# Patient Record
Sex: Female | Born: 1975 | Race: Black or African American | Hispanic: No | Marital: Single | Smoking: Never smoker
Health system: Southern US, Community
[De-identification: ages and names within clinical notes are randomized; demographics above are authoritative.]

## PROBLEM LIST (undated history)

## (undated) DIAGNOSIS — E278 Other specified disorders of adrenal gland: Secondary | ICD-10-CM

## (undated) DIAGNOSIS — R600 Localized edema: Secondary | ICD-10-CM

## (undated) DIAGNOSIS — D171 Benign lipomatous neoplasm of skin and subcutaneous tissue of trunk: Secondary | ICD-10-CM

## (undated) DIAGNOSIS — R002 Palpitations: Secondary | ICD-10-CM

## (undated) DIAGNOSIS — E669 Obesity, unspecified: Secondary | ICD-10-CM

## (undated) DIAGNOSIS — G43909 Migraine, unspecified, not intractable, without status migrainosus: Secondary | ICD-10-CM

## (undated) DIAGNOSIS — D649 Anemia, unspecified: Secondary | ICD-10-CM

## (undated) DIAGNOSIS — I10 Essential (primary) hypertension: Secondary | ICD-10-CM

## (undated) DIAGNOSIS — R058 Other specified cough: Secondary | ICD-10-CM

## (undated) DIAGNOSIS — I160 Hypertensive urgency: Secondary | ICD-10-CM

## (undated) DIAGNOSIS — T464X5A Adverse effect of angiotensin-converting-enzyme inhibitors, initial encounter: Secondary | ICD-10-CM

## (undated) DIAGNOSIS — R42 Dizziness and giddiness: Secondary | ICD-10-CM

## (undated) DIAGNOSIS — R7989 Other specified abnormal findings of blood chemistry: Secondary | ICD-10-CM

## (undated) DIAGNOSIS — R05 Cough: Secondary | ICD-10-CM

## (undated) DIAGNOSIS — F900 Attention-deficit hyperactivity disorder, predominantly inattentive type: Secondary | ICD-10-CM

## (undated) HISTORY — DX: Essential (primary) hypertension: I10

## (undated) HISTORY — PX: NASAL SEPTUM SURGERY: SHX37

## (undated) HISTORY — DX: Dizziness and giddiness: R42

## (undated) HISTORY — DX: Adverse effect of angiotensin-converting-enzyme inhibitors, initial encounter: T46.4X5A

## (undated) HISTORY — DX: Obesity, unspecified: E66.9

## (undated) HISTORY — DX: Attention-deficit hyperactivity disorder, predominantly inattentive type: F90.0

## (undated) HISTORY — PX: WISDOM TOOTH EXTRACTION: SHX21

## (undated) HISTORY — DX: Other specified cough: R05.8

## (undated) HISTORY — DX: Palpitations: R00.2

## (undated) HISTORY — DX: Cough: R05

## (undated) HISTORY — DX: Migraine, unspecified, not intractable, without status migrainosus: G43.909

## (undated) HISTORY — PX: TONSILLECTOMY: SUR1361

## (undated) HISTORY — DX: Localized edema: R60.0

---

## 2004-12-11 ENCOUNTER — Observation Stay: Payer: Self-pay | Admitting: Unknown Physician Specialty

## 2004-12-18 ENCOUNTER — Inpatient Hospital Stay: Payer: Self-pay

## 2004-12-18 HISTORY — PX: TUBAL LIGATION: SHX77

## 2005-04-25 ENCOUNTER — Ambulatory Visit: Payer: Self-pay

## 2009-02-04 ENCOUNTER — Encounter: Admission: RE | Admit: 2009-02-04 | Discharge: 2009-02-04 | Payer: Self-pay | Admitting: Allergy

## 2009-08-31 ENCOUNTER — Ambulatory Visit: Payer: Self-pay | Admitting: Internal Medicine

## 2010-09-22 ENCOUNTER — Ambulatory Visit: Payer: Self-pay

## 2010-09-26 ENCOUNTER — Ambulatory Visit: Payer: Self-pay | Admitting: Anesthesiology

## 2010-09-27 ENCOUNTER — Ambulatory Visit: Payer: Self-pay

## 2010-09-27 HISTORY — PX: LAPAROSCOPIC SUPRACERVICAL HYSTERECTOMY: SUR797

## 2011-07-05 ENCOUNTER — Ambulatory Visit (INDEPENDENT_AMBULATORY_CARE_PROVIDER_SITE_OTHER): Payer: BC Managed Care – PPO | Admitting: Cardiology

## 2011-07-05 ENCOUNTER — Encounter: Payer: Self-pay | Admitting: Cardiology

## 2011-07-05 VITALS — BP 138/110 | HR 81 | Ht 66.0 in | Wt 184.4 lb

## 2011-07-05 DIAGNOSIS — R5381 Other malaise: Secondary | ICD-10-CM

## 2011-07-05 DIAGNOSIS — R079 Chest pain, unspecified: Secondary | ICD-10-CM | POA: Insufficient documentation

## 2011-07-05 DIAGNOSIS — R5383 Other fatigue: Secondary | ICD-10-CM

## 2011-07-05 DIAGNOSIS — I10 Essential (primary) hypertension: Secondary | ICD-10-CM | POA: Insufficient documentation

## 2011-07-05 LAB — CBC WITH DIFFERENTIAL/PLATELET
Basophils Absolute: 0 10*3/uL (ref 0.0–0.1)
Lymphocytes Relative: 28.1 % (ref 12.0–46.0)
Lymphs Abs: 2 10*3/uL (ref 0.7–4.0)
Monocytes Relative: 5.2 % (ref 3.0–12.0)
Platelets: 272 10*3/uL (ref 150.0–400.0)
RDW: 15 % — ABNORMAL HIGH (ref 11.5–14.6)

## 2011-07-05 LAB — BASIC METABOLIC PANEL
CO2: 25 mEq/L (ref 19–32)
Calcium: 9 mg/dL (ref 8.4–10.5)
Chloride: 109 mEq/L (ref 96–112)
Sodium: 145 mEq/L (ref 135–145)

## 2011-07-05 LAB — LIPID PANEL
HDL: 62.5 mg/dL (ref >39.00)
LDL Cholesterol: 103 mg/dL — ABNORMAL HIGH (ref 0–99)
Total CHOL/HDL Ratio: 3
Triglycerides: 58 mg/dL (ref 0.0–149.0)

## 2011-07-05 LAB — HEPATIC FUNCTION PANEL
Alkaline Phosphatase: 55 U/L (ref 39–117)
Bilirubin, Direct: 0.1 mg/dL (ref 0.0–0.3)
Total Bilirubin: 0.5 mg/dL (ref 0.3–1.2)
Total Protein: 8 g/dL (ref 6.0–8.3)

## 2011-07-05 MED ORDER — LISINOPRIL-HYDROCHLOROTHIAZIDE 20-12.5 MG PO TABS: 1.0000 | ORAL_TABLET | Freq: Every day | ORAL | Status: DC

## 2011-07-05 NOTE — Progress Notes (Signed)
   Amanda Fields Date of Birth: 01-12-1976   History of Present Illness: Amanda Fields is self-referred for evaluation of cardiovascular risk assessment. She is concerned about her family history since an uncle has congestive heart failure and a cousin had a myocardial infarction at age 37. She really denies any significant chest pain. Occasionally she feels a slight cramp sensation in her chest. This has been off and on for over one year. She is always attributed this to gas. It has not changed in frequency or severity. She has had hypertension since 2006 when she was diagnosed with this during pregnancy. The only medication she has been taking is torsemide. She can tell when her blood pressure is up because her ankles swell. She has had no recent evaluation of her blood pressure or laboratory evaluation. She denies any shortness of breath.  No current outpatient prescriptions on file prior to visit.    No Known Allergies  Past Medical History  Diagnosis Date  . Hypertension   . Edema leg   . Dizziness   . Migraine headache   . Asthma   . Chest pain   . Palpitation   . Endometriosis     Past Surgical History  Procedure Date  . Tonsillectomy   . Tubal ligation     History  Smoking status  . Never Smoker   Smokeless tobacco  . Not on file    History  Alcohol Use No    Family History  Problem Relation Age of Onset  . COPD Mother   . Hypotension Mother   . Colon cancer Father   . Emphysema Father   . Hyperlipidemia Sister   . Heart failure Paternal Uncle   . Heart attack Cousin 32    Review of Systems: The review of systems is positive for migraine headaches. She has mild lightheadedness at times. All other systems were reviewed and are negative.  Physical Exam: BP 138/110  Pulse 81  Ht 5\' 6"  (1.676 m)  Wt 184 lb 6.4 oz (83.643 kg)  BMI 29.76 kg/m2 She is a pleasant black female in no acute distress. She is normocephalic, atraumatic. Pupils are equal round and  reactive to light and accommodation. Extraocular movements are full. Sclerae clear. Oropharynx is clear. Neck is supple without JVD, adenopathy, thyromegaly, or bruits. Lungs are clear. Cardiac exam reveals a regular rate and rhythm. Normal S1-S2. No gallop, murmur, or click. Abdomen is soft and nontender without masses or bruits. Femoral and pedal pulses are 2+ and symmetric. She has no edema. Skin is warm and dry. She has tattoos on her lower extremities. She is alert oriented x3. Cranial nerves II through XII are intact. LABORATORY DATA: ECG demonstrates normal sinus rhythm with a nonspecific T-wave abnormality.  Assessment / Plan:

## 2011-07-05 NOTE — Assessment & Plan Note (Signed)
Her chest pain symptoms are very atypical for cardiac pain. I have recommended no further evaluation at this time. We need to focus on risk factor modification.

## 2011-07-05 NOTE — Assessment & Plan Note (Signed)
Her blood pressure is poorly controlled. She is only taking torsemide for blood pressure. I think this is a poor choice for blood pressure agent. We will switch her to lisinopril HCT 20/12.5 mg daily. I've instructed her on sodium restriction. She is to continue with her exercise program. We will obtain baseline lab work today including CBC, chemistry panel, lipid panel, and TSH. I will followup again in one month to assess her blood pressure control. She will need to establish with a primary care physician long-term.

## 2011-07-05 NOTE — Patient Instructions (Addendum)
Avoid salt intake.  Stop torsemide.  We will start you on prinizide HCT 20-12.5 mg daily.  We will check blood work on you today and call you with results.  We will see you back again in one month.

## 2011-07-06 ENCOUNTER — Encounter: Payer: Self-pay | Admitting: Cardiology

## 2011-07-07 ENCOUNTER — Telehealth: Payer: Self-pay | Admitting: *Deleted

## 2011-07-07 DIAGNOSIS — I10 Essential (primary) hypertension: Secondary | ICD-10-CM

## 2011-07-07 MED ORDER — POTASSIUM CHLORIDE CRYS ER 20 MEQ PO TBCR: 20.0000 meq | EXTENDED_RELEASE_TABLET | Freq: Every day | ORAL | Status: DC

## 2011-07-07 NOTE — Progress Notes (Signed)
Lm

## 2011-07-07 NOTE — Telephone Encounter (Signed)
Message copied by Lorayne Bender on Fri Jul 07, 2011  8:20 AM ------      Message from: [place], PETER M      Created: Thu Jul 06, 2011 11:55 AM       CBC, TSH, chemistries look good except low K+. Rec potassium 20 meq daily. Recheck Bmet next visit. Should get better off Torsemide. Lipids OK.

## 2011-07-07 NOTE — Telephone Encounter (Signed)
Notified of lab results. Sent Rx for klor con to [facility]. Will get repeat Bmet next visit.

## 2011-08-02 ENCOUNTER — Other Ambulatory Visit: Payer: Self-pay | Admitting: Nurse Practitioner

## 2011-08-02 DIAGNOSIS — I10 Essential (primary) hypertension: Secondary | ICD-10-CM

## 2011-08-04 ENCOUNTER — Ambulatory Visit: Payer: BC Managed Care – PPO | Admitting: Nurse Practitioner

## 2011-08-04 ENCOUNTER — Other Ambulatory Visit: Payer: BC Managed Care – PPO | Admitting: *Deleted

## 2011-08-30 ENCOUNTER — Encounter: Payer: Self-pay | Admitting: Obstetrics & Gynecology

## 2011-08-30 ENCOUNTER — Other Ambulatory Visit: Payer: Self-pay | Admitting: Obstetrics & Gynecology

## 2011-08-30 ENCOUNTER — Ambulatory Visit (INDEPENDENT_AMBULATORY_CARE_PROVIDER_SITE_OTHER): Payer: BC Managed Care – PPO | Admitting: Obstetrics & Gynecology

## 2011-08-30 VITALS — BP 144/99 | HR 90 | Ht 67.0 in | Wt 177.0 lb

## 2011-08-30 DIAGNOSIS — N644 Mastodynia: Secondary | ICD-10-CM

## 2011-08-30 DIAGNOSIS — Z113 Encounter for screening for infections with a predominantly sexual mode of transmission: Secondary | ICD-10-CM

## 2011-08-30 DIAGNOSIS — Z01419 Encounter for gynecological examination (general) (routine) without abnormal findings: Secondary | ICD-10-CM

## 2011-08-30 DIAGNOSIS — N6310 Unspecified lump in the right breast, unspecified quadrant: Secondary | ICD-10-CM

## 2011-08-30 DIAGNOSIS — Z1272 Encounter for screening for malignant neoplasm of vagina: Secondary | ICD-10-CM

## 2011-08-30 LAB — HEPATITIS B SURFACE ANTIBODY,QUALITATIVE

## 2011-08-30 LAB — HIV ANTIBODY (ROUTINE TESTING W REFLEX): HIV: NONREACTIVE

## 2011-08-30 NOTE — Progress Notes (Signed)
  Subjective:     Amanda Fields is a 35 y.o. female here for a routine exam.  Patient complains of pain in her right breast area, says she feels an enlarged vessel.   No other symptoms.  Of note, patient reports that she underwent laparoscopic supracervical hysterectomy in 2011 for endometriosis; no BSO done as patient reports she did not want hormone replacement therapy.  No further symptoms of endometriosis since surgery.   Gynecologic History No LMP recorded. Patient has had a hysterectomy. Contraception: status post hysterectomy Last Pap was last year. Results were: normal  Obstetric History OB History    Grav Para Term Preterm Abortions TAB SAB Ect Mult Living   2 2 2       2      # Outc Date GA Lbr Len/2nd Wgt Sex Del Anes PTL Lv   1 TRM 11/96    F SVD   Yes   2 TRM 1/06    M SVD   Yes     The following portions of the patient's history were reviewed and updated as appropriate: allergies, current medications, past family history, past medical history, past social history, past surgical history and problem list.  Review of Systems A comprehensive review of systems was negative.    Objective:    GENERAL: Well-developed, well-nourished female in no acute distress.  HEENT: Normocephalic, atraumatic. Sclerae anicteric.  NECK: Supple. Normal thyroid.  LUNGS: Clear to auscultation bilaterally.  HEART: Regular rate and rhythm. BREASTS: Symmetric with everted nipples. Palpable, tender cord/vessle in the upper, outer quadrant of right breast.  No other masses, skin changes, nipple drainage, lymphadenopathy bilaterally. ABDOMEN: Soft, nontender, nondistended. No organomegaly. PELVIC: Normal external female genitalia. Vagina is pink and rugated.  Normal discharge. Multiparous cervix contour. Uterus not palpated. No adnexal mass or tenderness. Pap smear, wet prep obtained.  EXTREMITIES: No cyanosis, clubbing, or edema, 2+ distal pulses.     Assessment:    Healthy female exam.  Patient  desires STI screen   Plan:  Follow up pap and STI screen Diagnostic breast ultrasound ordered, will follow up results Obtain records from prior OB/GYN Routine gynecologic care

## 2011-08-30 NOTE — Patient Instructions (Signed)

## 2011-08-31 LAB — WET PREP, GENITAL: Trich, Wet Prep: NONE SEEN

## 2011-09-06 ENCOUNTER — Ambulatory Visit
Admission: RE | Admit: 2011-09-06 | Discharge: 2011-09-06 | Disposition: A | Discharge status: Home / Self Care | Payer: BC Managed Care – PPO | Source: Ambulatory Visit | Attending: Obstetrics & Gynecology | Admitting: Obstetrics & Gynecology

## 2011-09-06 DIAGNOSIS — N6310 Unspecified lump in the right breast, unspecified quadrant: Secondary | ICD-10-CM

## 2011-09-06 DIAGNOSIS — N644 Mastodynia: Secondary | ICD-10-CM

## 2011-09-06 DIAGNOSIS — D171 Benign lipomatous neoplasm of skin and subcutaneous tissue of trunk: Secondary | ICD-10-CM

## 2011-09-06 HISTORY — DX: Benign lipomatous neoplasm of skin and subcutaneous tissue of trunk: D17.1

## 2011-09-06 NOTE — Assessment & Plan Note (Signed)
Benign right anterior chest wall lipoma on 09/06/11 breast ultrasound and diagnostic mammogram; needs repeat screening mammogram in one year.

## 2012-01-04 ENCOUNTER — Ambulatory Visit (INDEPENDENT_AMBULATORY_CARE_PROVIDER_SITE_OTHER): Payer: BC Managed Care – PPO | Admitting: Obstetrics & Gynecology

## 2012-01-04 ENCOUNTER — Encounter: Payer: Self-pay | Admitting: Obstetrics & Gynecology

## 2012-01-04 VITALS — BP 134/108 | HR 66 | Ht 66.0 in | Wt 188.0 lb

## 2012-01-04 DIAGNOSIS — G8929 Other chronic pain: Secondary | ICD-10-CM

## 2012-01-04 DIAGNOSIS — Z23 Encounter for immunization: Secondary | ICD-10-CM

## 2012-01-04 DIAGNOSIS — N949 Unspecified condition associated with female genital organs and menstrual cycle: Secondary | ICD-10-CM

## 2012-01-04 DIAGNOSIS — R102 Pelvic and perineal pain: Secondary | ICD-10-CM

## 2012-01-04 NOTE — Progress Notes (Signed)
  Subjective:    Patient ID: Amanda Fields, female    DOB: [birth year], 36 y.o.   MRN: [id number]  HPI  36 yo S AA lady who had a laproscopic supracervical hysterectomy 10/11 at [facility]. She was told she has endometriosis. We are in the process of trying to get those records. She comes in with continued pelvic pain "like someone is grinding something into my right side".   Review of Systems     Objective:   Physical Exam   Normal appearing cervix, bimanual reveals no masses     Assessment & Plan:  CPP- probably continued endometriosis. I will get cervical cultures and a pelvic ultrasound. If these are normal, and her pathology report confirms endometriosis, then I would recommend a laproscopic/robotic BSO.

## 2012-01-05 LAB — GC/CHLAMYDIA PROBE AMP, URINE: Chlamydia, Swab/Urine, PCR: NEGATIVE

## 2012-01-31 ENCOUNTER — Ambulatory Visit: Payer: BC Managed Care – PPO | Admitting: Obstetrics & Gynecology

## 2012-01-31 DIAGNOSIS — N949 Unspecified condition associated with female genital organs and menstrual cycle: Secondary | ICD-10-CM

## 2012-03-05 ENCOUNTER — Encounter: Payer: Self-pay | Admitting: Nurse Practitioner

## 2012-03-05 ENCOUNTER — Ambulatory Visit (INDEPENDENT_AMBULATORY_CARE_PROVIDER_SITE_OTHER): Payer: BC Managed Care – PPO | Admitting: Nurse Practitioner

## 2012-03-05 VITALS — BP 148/106 | HR 78 | Ht 66.75 in | Wt 193.0 lb

## 2012-03-05 DIAGNOSIS — I10 Essential (primary) hypertension: Secondary | ICD-10-CM

## 2012-03-05 LAB — BASIC METABOLIC PANEL
BUN: 14 mg/dL (ref 6–23)
CO2: 29 mEq/L (ref 19–32)
Calcium: 9.4 mg/dL (ref 8.4–10.5)
Chloride: 105 mEq/L (ref 96–112)
Creatinine, Ser: 1 mg/dL (ref 0.4–1.2)
GFR: 76.23 mL/min (ref >60.00)
Glucose, Bld: 88 mg/dL (ref 70–99)
Potassium: 3.8 mEq/L (ref 3.5–5.1)
Sodium: 142 mEq/L (ref 135–145)

## 2012-03-05 NOTE — Patient Instructions (Signed)
Stop your Lisinopril Hct  Start Benicar HCT 40/25 mg each morning.  Stay on your other medicines.  We are going to check lab today.  I will see you in about 2 1/2 weeks.  Monitor your blood pressure at home.   Call the [facility] office at [phone] if you have any questions, problems or concerns.

## 2012-03-05 NOTE — Progress Notes (Signed)
   Amanda Fields Date of Birth: 03-22-1976 Medical Record #[id number]  History of Present Illness: Amanda Fields is seen today for a work in visit. She is seen for Dr. [place]. She is a 36 year old black female with a history of HTN. She is here because her blood pressure is not controlled and she is having more issues with swelling. She tries to watch her salt. She is exercising regularly. No chest pain. Not short of breath. Frustrated about trying to lose weight. She does note a chronic cough since she has been on the Lisinopril Hct. She actually takes this at night.   Current Outpatient Prescriptions on File Prior to Visit  Medication Sig Dispense Refill  . baclofen (LIORESAL) 10 MG tablet Take 10 mg by mouth as needed.       . multivitamin-iron-minerals-folic acid (CENTRUM) chewable tablet Chew 1 tablet by mouth daily.        . potassium chloride SA (KLOR-CON M20) 20 MEQ tablet Take 1 tablet (20 mEq total) by mouth daily.  30 tablet  5  . topiramate (TOPAMAX) 100 MG tablet Take 100 mg by mouth as needed.         No Known Allergies  Past Medical History  Diagnosis Date  . Hypertension   . Edema leg   . Dizziness   . Migraine headache   . Asthma   . Palpitation   . Endometriosis   . 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 Obesity     Past Surgical History  Procedure Date  . Tonsillectomy   . Tubal ligation   . Wisdom tooth extraction   . Laparoscopic supracervical hysterectomy 09/27/2010    For endometriosis. Ovaries anf tubes are in place.    History  Smoking status  . Never Smoker   Smokeless tobacco  . Never Used    History  Alcohol Use No    Family History  Problem Relation Age of Onset  . COPD Mother   . Hypotension Mother   . Colon cancer Father   . Emphysema Father   . Cancer Father     colon  . Hyperlipidemia Sister   . Heart failure Paternal Uncle   . Heart attack Cousin 32  . Heart disease Cousin     heart attack    Review of Systems: The review of systems  is positive for edema. It does go down overnight.  All other systems were reviewed and are negative.  Physical Exam: BP 148/106  Pulse 78  Ht 5' 6.75" (1.695 m)  Wt 193 lb (87.544 kg)  BMI 30.45 kg/m2 Patient is very pleasant and in no acute distress. She is obese. Skin is warm and dry. Color is normal.  HEENT is unremarkable. Normocephalic/atraumatic. PERRL. Sclera are nonicteric. Neck is supple. No masses. No JVD. Lungs are clear. Cardiac exam shows a regular rate and rhythm. Abdomen is soft. Extremities are with just a trace edema. Gait and ROM are intact. No gross neurologic deficits noted.   LABORATORY DATA: PENDING  Assessment / Plan:

## 2012-03-05 NOTE — Assessment & Plan Note (Signed)
Blood pressure remains elevated. She continues to note edema of her legs. She has a probable ACE cough. I have stopped her Lisinopril Hct and placed her on Benicar Hct 40/25 mg daily. Samples are given. If she tolerates, I will change her to the generic on her return visit. We are checking a follow up BMET today. I will see her back in about 2 1/2 weeks. She is to watch her salt and to continue to monitor her blood pressure at home. Patient is agreeable to this plan and will call if any problems develop in the interim.

## 2012-03-07 ENCOUNTER — Other Ambulatory Visit: Payer: Self-pay | Admitting: *Deleted

## 2012-03-07 MED ORDER — POTASSIUM CHLORIDE CRYS ER 20 MEQ PO TBCR: 20.0000 meq | EXTENDED_RELEASE_TABLET | Freq: Every day | ORAL | Status: DC

## 2012-03-22 ENCOUNTER — Ambulatory Visit (INDEPENDENT_AMBULATORY_CARE_PROVIDER_SITE_OTHER): Payer: BC Managed Care – PPO | Admitting: Nurse Practitioner

## 2012-03-22 ENCOUNTER — Encounter: Payer: Self-pay | Admitting: Nurse Practitioner

## 2012-03-22 VITALS — BP 122/98 | HR 76 | Ht 66.75 in | Wt 196.0 lb

## 2012-03-22 DIAGNOSIS — I10 Essential (primary) hypertension: Secondary | ICD-10-CM

## 2012-03-22 LAB — BASIC METABOLIC PANEL
BUN: 12 mg/dL (ref 6–23)
CO2: 30 mEq/L (ref 19–32)
Calcium: 9.1 mg/dL (ref 8.4–10.5)
Chloride: 104 mEq/L (ref 96–112)
Creatinine, Ser: 1.1 mg/dL (ref 0.4–1.2)
GFR: 72.96 mL/min (ref >60.00)
Glucose, Bld: 77 mg/dL (ref 70–99)
Potassium: 3.5 mEq/L (ref 3.5–5.1)
Sodium: 142 mEq/L (ref 135–145)

## 2012-03-22 MED ORDER — OLMESARTAN MEDOXOMIL-HCTZ 40-25 MG PO TABS: 1.0000 | ORAL_TABLET | Freq: Every day | ORAL | Status: DC

## 2012-03-22 NOTE — Assessment & Plan Note (Signed)
Blood pressure is better at home. She feels good on her current regimen. Her swelling is resolved. She is encouraged to minimize her salt intake. She will continue to monitor her blood pressure at home. I have sent a prescription to the drug store for the Benicar Hct but we may have to switch to Hyzaar due to insurance. She is ACE allergic. We will recheck a BMET today. We will tentatively see her back in 6 months. Patient is agreeable to this plan and will call if any problems develop in the interim.

## 2012-03-22 NOTE — Progress Notes (Signed)
Amanda Fields Date of Birth: 1976-11-14 Medical Record #[id number]  History of Present Illness: Amanda Fields is seen back today for a 2 week check. She is seen for. Dr. [place]. She has HTN and edema. She was seen 2 weeks ago and was complaining of swelling and a dry, hacky cough. She was on Lisinopril HCT, which she was taking at night. This was switched over  to Benicar HCT with an increase in the dose of diuretic.   She comes back today for follow up. She is here with her son. She is doing well. She really likes being on the Benicar HCT. Her swelling is resolved. Blood pressure at home has trended back down and was 120/85 this morning. She continues to exercise regularly. She is on potassium supplementation with her ARB/diuretic. Will need a recheck of her BMET today.   Current Outpatient Prescriptions on File Prior to Visit  Medication Sig Dispense Refill  . baclofen (LIORESAL) 10 MG tablet Take 10 mg by mouth as needed.       . multivitamin-iron-minerals-folic acid (CENTRUM) chewable tablet Chew 1 tablet by mouth daily.        . potassium chloride SA (KLOR-CON M20) 20 MEQ tablet Take 1 tablet (20 mEq total) by mouth daily.  30 tablet  5  . topiramate (TOPAMAX) 100 MG tablet Take 100 mg by mouth as needed.       Marland Kitchen DISCONTD: fluticasone (FLONASE) 50 MCG/ACT nasal spray Place 2 sprays into the nose as needed.        Marland Kitchen DISCONTD: lisinopril-hydrochlorothiazide (PRINZIDE) 20-12.5 MG per tablet Take 1 tablet by mouth daily.  30 tablet  6  . DISCONTD: torsemide (DEMADEX) 10 MG tablet Take 10 mg by mouth daily.          No Known Allergies  Past Medical History  Diagnosis Date  . Hypertension   . Edema leg   . Dizziness   . Migraine headache   . Asthma   . Palpitation   . Endometriosis   . 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 Obesity   . Cough due to ACE inhibitor     Past Surgical History  Procedure Date  . Tonsillectomy   . Tubal ligation   . Wisdom tooth extraction   . Laparoscopic  supracervical hysterectomy 09/27/2010    For endometriosis. Ovaries anf tubes are in place.    History  Smoking status  . Never Smoker   Smokeless tobacco  . Never Used    History  Alcohol Use No    Family History  Problem Relation Age of Onset  . COPD Mother   . Hypotension Mother   . Colon cancer Father   . Emphysema Father   . Cancer Father     colon  . Hyperlipidemia Sister   . Heart failure Paternal Uncle   . Heart attack Cousin 32  . Heart disease Cousin     heart attack    Review of Systems: The review of systems is per the HPI.   All other systems were reviewed and are negative.  Physical Exam: Ht 5' 6.75" (1.695 m)  Wt 196 lb (88.905 kg)  BMI 30.93 kg/m2 Patient is very pleasant and in no acute distress. Skin is warm and dry. Color is normal.  HEENT is unremarkable. Normocephalic/atraumatic. PERRL. Sclera are nonicteric. Neck is supple. No masses. No JVD. Lungs are clear. Cardiac exam shows a regular rate and rhythm. Abdomen is soft. Extremities are without edema. Gait and ROM are  intact. No gross neurologic deficits noted.  LABORATORY DATA: Repeat BMET is pending.    Lab Results  Component Value Date   WBC 6.9 07/05/2011   HGB 12.5 07/05/2011   HCT 37.6 07/05/2011   PLT 272.0 07/05/2011   GLUCOSE 88 03/05/2012   CHOL 177 07/05/2011   TRIG 58.0 07/05/2011   HDL 62.50 07/05/2011   LDLCALC 103* 07/05/2011   ALT 14 07/05/2011   AST 20 07/05/2011   NA 142 03/05/2012   K 3.8 03/05/2012   CL 105 03/05/2012   CREATININE 1.0 03/05/2012   BUN 14 03/05/2012   CO2 29 03/05/2012   TSH 0.83 07/05/2011     Assessment / Plan:

## 2012-03-22 NOTE — Patient Instructions (Signed)
Continue with your current medicines. Monitor your blood pressure at home.  Record your readings and bring to your next visit. Limit sodium intake. Call for any problems.  We will see you in 6 months.  Call the [facility] office at [phone] if you have any questions, problems or concerns.

## 2012-04-09 ENCOUNTER — Ambulatory Visit (INDEPENDENT_AMBULATORY_CARE_PROVIDER_SITE_OTHER): Payer: BC Managed Care – PPO | Admitting: Family Medicine

## 2012-04-09 ENCOUNTER — Encounter: Payer: Self-pay | Admitting: Family Medicine

## 2012-04-09 VITALS — BP 134/100 | HR 84 | Temp 98.2°F | Wt 197.0 lb

## 2012-04-09 DIAGNOSIS — I10 Essential (primary) hypertension: Secondary | ICD-10-CM

## 2012-04-09 DIAGNOSIS — R600 Localized edema: Secondary | ICD-10-CM | POA: Insufficient documentation

## 2012-04-09 DIAGNOSIS — R609 Edema, unspecified: Secondary | ICD-10-CM

## 2012-04-09 LAB — T4, FREE: Free T4: 0.72 ng/dL (ref 0.60–1.60)

## 2012-04-09 LAB — COMPREHENSIVE METABOLIC PANEL
ALT: 16 U/L (ref 0–35)
AST: 23 U/L (ref 0–37)
BUN: 20 mg/dL (ref 6–23)
Creatinine, Ser: 1.1 mg/dL (ref 0.4–1.2)
Total Bilirubin: 0.2 mg/dL — ABNORMAL LOW (ref 0.3–1.2)

## 2012-04-09 MED ORDER — POTASSIUM CHLORIDE CRYS ER 20 MEQ PO TBCR: 20.0000 meq | EXTENDED_RELEASE_TABLET | Freq: Every day | ORAL | Status: DC

## 2012-04-09 MED ORDER — OLMESARTAN MEDOXOMIL-HCTZ 40-25 MG PO TABS: 1.0000 | ORAL_TABLET | Freq: Every day | ORAL | Status: DC

## 2012-04-09 NOTE — Patient Instructions (Signed)
I am going to either speak with or get an appointment with your cardiologist. We are likely going to change your medication. I also ordered an ultrasound of your heart. We are rechecking some labs today so we will call with those results as well.

## 2012-04-09 NOTE — Progress Notes (Signed)
Very pleasant 36 yo female here to establish care emergently.  We received a call from the [facility] clinic stating she needed to be seen for LE edema or her "veins would collapse."  Had been seeing Dr. [place] and Lawson Fiscal for HTN and edema.  She was seen in March for complaining of swelling and a dry, hacky cough. She was on Lisinopril HCT, which she was taking at night. This was switched over  to Benicar HCT with an increase in the dose of diuretic and symptoms initially improved until this weekend.  Bilateral lower extremity edema deteriorated and she went to UC in [place] (awaiting those records).  Of note, she was on demadex for years for edema and HTN which was d/c'd by cardiology as it was not controlling her BP.  She has never had any SOB or CP. Pt denies having a 2 d echo.  She exercises daily.  Feels she has no increased salt intake and that swelling has been deteriorating for past couple of weeks.  Went to "vein doctor" in [place].  Per pt, dopplers neg.  Current Outpatient Prescriptions on File Prior to Visit  Medication Sig Dispense Refill  . baclofen (LIORESAL) 10 MG tablet Take 10 mg by mouth as needed.       . multivitamin-iron-minerals-folic acid (CENTRUM) chewable tablet Chew 1 tablet by mouth daily.        Marland Kitchen topiramate (TOPAMAX) 100 MG tablet Take 100 mg by mouth as needed.       Marland Kitchen DISCONTD: potassium chloride SA (KLOR-CON M20) 20 MEQ tablet Take 1 tablet (20 mEq total) by mouth daily.  30 tablet  5  . DISCONTD: fluticasone (FLONASE) 50 MCG/ACT nasal spray Place 2 sprays into the nose as needed.        Marland Kitchen DISCONTD: lisinopril-hydrochlorothiazide (PRINZIDE) 20-12.5 MG per tablet Take 1 tablet by mouth daily.  30 tablet  6  . DISCONTD: torsemide (DEMADEX) 10 MG tablet Take 10 mg by mouth daily.          Allergies  Allergen Reactions  . Ace Inhibitors Cough    Past Medical History  Diagnosis Date  . Hypertension   . Edema leg   . Dizziness   . Migraine headache     . Asthma   . Palpitation   . Endometriosis   . 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 Obesity   . Cough due to ACE inhibitor     Past Surgical History  Procedure Date  . Tonsillectomy   . Tubal ligation   . Wisdom tooth extraction   . Laparoscopic supracervical hysterectomy 09/27/2010    For endometriosis. Ovaries anf tubes are in place.    History  Smoking status  . Never Smoker   Smokeless tobacco  . Never Used    History  Alcohol Use No    Family History  Problem Relation Age of Onset  . COPD Mother   . Hypotension Mother   . Colon cancer Father   . Emphysema Father   . Cancer Father     colon  . Hyperlipidemia Sister   . Heart failure Paternal Uncle   . Heart attack Cousin 32  . Heart disease Cousin     heart attack    Review of Systems: The review of systems is per the HPI.   All other systems were reviewed and are negative.  Physical Exam: BP 134/100  Pulse 84  Temp(Src) 98.2 F (36.8 C) (Oral)  Wt 197 lb (89.359 kg) BP  Readings from Last 3 Encounters:  04/09/12 134/100  03/22/12 122/98  03/05/12 148/106    General:  Well-developed,well-nourished,in no acute distress; alert,appropriate and cooperative throughout examination Head:  normocephalic and atraumatic.   Eyes:  vision grossly intact, pupils equal, pupils round, and pupils reactive to light.   Ears:  R ear normal and L ear normal.   Nose:  no external deformity.   Mouth:  good dentition.   Lungs:  Normal respiratory effort, chest expands symmetrically. Lungs are clear to auscultation, no crackles or wheezes. Heart:  Normal rate and regular rhythm. S1 and S2 normal without gallop, murmur, click, rub or other extra sounds. Abdomen:  Bowel sounds positive,abdomen soft and non-tender without masses, organomegaly or hernias noted. Extremities:  2+pitting edema bilaterally   Neurologic:  alert & oriented X3 and gait normal.   Skin:  Intact without suspicious lesions or rashes Psych:  Cognition and  judgment appear intact. Alert and cooperative with normal attention span and concentration. No apparent delusions, illusions, hallucinations   1. Bilateral leg edema  Comprehensive metabolic panel, TSH, T4, Free, 2D Echocardiogram without contrast      Lab Results  Component Value Date   WBC 6.9 07/05/2011   HGB 12.5 07/05/2011   HCT 37.6 07/05/2011   PLT 272.0 07/05/2011   GLUCOSE 77 03/22/2012   CHOL 177 07/05/2011   TRIG 58.0 07/05/2011   HDL 62.50 07/05/2011   LDLCALC 103* 07/05/2011   ALT 14 07/05/2011   AST 20 07/05/2011   NA 142 03/22/2012   K 3.5 03/22/2012   CL 104 03/22/2012   CREATININE 1.1 03/22/2012   BUN 12 03/22/2012   CO2 30 03/22/2012   TSH 0.83 07/05/2011     Assessment / Plan: 1. Bilateral leg edema   Deteriorated. Start compression hose. Recheck labs today and order 2 decho for further evaluation. Spoke with Rickey Primus, NP who has been managing her HTN and edema.   Per her suggestion, will continue her current dose of Benicar and follow up with cardiology after she has her echo. Comprehensive metabolic panel, TSH, T4, Free, 2D Echocardiogram without contrast  2. Hypertension  See above.

## 2012-04-10 ENCOUNTER — Other Ambulatory Visit: Payer: Self-pay

## 2012-04-10 ENCOUNTER — Other Ambulatory Visit (INDEPENDENT_AMBULATORY_CARE_PROVIDER_SITE_OTHER): Payer: BC Managed Care – PPO

## 2012-04-10 DIAGNOSIS — R600 Localized edema: Secondary | ICD-10-CM

## 2012-04-10 DIAGNOSIS — R002 Palpitations: Secondary | ICD-10-CM

## 2012-04-10 DIAGNOSIS — R609 Edema, unspecified: Secondary | ICD-10-CM

## 2012-04-17 ENCOUNTER — Ambulatory Visit (INDEPENDENT_AMBULATORY_CARE_PROVIDER_SITE_OTHER): Payer: BC Managed Care – PPO | Admitting: Nurse Practitioner

## 2012-04-17 ENCOUNTER — Encounter: Payer: Self-pay | Admitting: Nurse Practitioner

## 2012-04-17 VITALS — BP 118/82 | HR 78 | Ht 66.0 in | Wt 198.0 lb

## 2012-04-17 DIAGNOSIS — R609 Edema, unspecified: Secondary | ICD-10-CM

## 2012-04-17 DIAGNOSIS — R600 Localized edema: Secondary | ICD-10-CM

## 2012-04-17 DIAGNOSIS — I10 Essential (primary) hypertension: Secondary | ICD-10-CM

## 2012-04-17 NOTE — Assessment & Plan Note (Signed)
Her blood pressure is great. Her echo looks good except for the moderate LVH. She has normal systolic and diastolic function. Perfect blood pressure control really needs to be her goal for long term. I have left her on her current regimen. Risk factor modification is strongly encouraged. We will see her back at her regular time. Patient is agreeable to this plan and will call if any problems develop in the interim.

## 2012-04-17 NOTE — Patient Instructions (Signed)
Stay on your regular medicines  Exercise daily  Minimize salt to less than 1500mg  daily  Use the support stockings for your swelling  We will see you back in October as scheduled.  Call the [facility] office at [phone] if you have any questions, problems or concerns.

## 2012-04-17 NOTE — Progress Notes (Signed)
Harle Stanford Date of Birth: 09/15/1976 Medical Record #[id number]  History of Present Illness: Ms. Harbor is seen back today for a follow up visit. She is seen for Dr. [place]. She has HTN and has had more recent issues with edema of her feet and legs. She was referred for an echocardiogram to further evaluate the swelling.  She comes in today. She is here alone. She is feeling good. Blood pressure has trended down nicely. We have discussed her echo results in depth. She has normal LV function, no evidence of diastolic dysfunction but with moderate LVH. Good blood pressure control is going to be critical. She has started using support stockings and has had significant improvement in her swelling. Her salt use still seems questionable to me. Has been to [facility] recently. She says food is starting to taste different. She says she is exercising 6 days a week but continues to struggle with her weight. Denies using NSAIDs. She is not having shortness of breath. No chest pain. Her past cough resolved with switching to ARB therapy.   Current Outpatient Prescriptions on File Prior to Visit  Medication Sig Dispense Refill  . baclofen (LIORESAL) 10 MG tablet Take 10 mg by mouth as needed.       . multivitamin-iron-minerals-folic acid (CENTRUM) chewable tablet Chew 1 tablet by mouth daily.        Marland Kitchen olmesartan-hydrochlorothiazide (BENICAR HCT) 40-25 MG per tablet Take 1 tablet by mouth daily.  90 tablet  3  . potassium chloride SA (KLOR-CON M20) 20 MEQ tablet Take 1 tablet (20 mEq total) by mouth daily.  30 tablet  5  . topiramate (TOPAMAX) 100 MG tablet Take 100 mg by mouth as needed.       Marland Kitchen DISCONTD: fluticasone (FLONASE) 50 MCG/ACT nasal spray Place 2 sprays into the nose as needed.        Marland Kitchen DISCONTD: lisinopril-hydrochlorothiazide (PRINZIDE) 20-12.5 MG per tablet Take 1 tablet by mouth daily.  30 tablet  6  . DISCONTD: torsemide (DEMADEX) 10 MG tablet Take 10 mg by mouth daily.          Allergies    Allergen Reactions  . Ace Inhibitors Cough    Past Medical History  Diagnosis Date  . Hypertension     echo 03/2012 with moderate LVH, but normal EF and no evidence of diastolic dysfunction  . Edema leg   . Dizziness   . Migraine headache   . Asthma   . Palpitation   . Endometriosis   . 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 Obesity   . Cough due to ACE inhibitor     Past Surgical History  Procedure Date  . Tonsillectomy   . Tubal ligation   . Wisdom tooth extraction   . Laparoscopic supracervical hysterectomy 09/27/2010    For endometriosis. Ovaries anf tubes are in place.    History  Smoking status  . Never Smoker   Smokeless tobacco  . Never Used    History  Alcohol Use No    Family History  Problem Relation Age of Onset  . COPD Mother   . Hypotension Mother   . Colon cancer Father   . Emphysema Father   . Cancer Father     colon  . Hyperlipidemia Sister   . Heart failure Paternal Uncle   . Heart attack Cousin 32  . Heart disease Cousin     heart attack    Review of Systems: The review of systems is per the HPI.  All other systems were reviewed and are negative.  Physical Exam: BP 118/82  Pulse 78  Ht 5\' 6"  (1.676 m)  Wt 198 lb (89.812 kg)  BMI 31.96 kg/m2 Patient is very pleasant and in no acute distress. Skin is warm and dry. Color is normal.  HEENT is unremarkable. Normocephalic/atraumatic. PERRL. Sclera are nonicteric. Neck is supple. No masses. No JVD. Lungs are clear. Cardiac exam shows a regular rate and rhythm. Abdomen is soft. Extremities are without edema. Gait and ROM are intact. No gross neurologic deficits noted.   LABORATORY DATA:  Echo Study Conclusions April 2013  - Left ventricle: The cavity size was normal. Wall thickness was increased in a pattern of moderate LVH. There was mild concentric hypertrophy. Systolic function was normal. The estimated ejection fraction was in the range of 60% to 65%. Wall motion was normal; there were no  regional wall motion abnormalities. Left ventricular diastolic function parameters were normal. - Left atrium: The atrium was normal in size. - Right ventricle: Systolic function was normal. - Pulmonary arteries: Systolic pressure was within the normal range.    Assessment / Plan:

## 2012-04-17 NOTE — Assessment & Plan Note (Signed)
Her swelling has improved with support stockings and salt restriction. I did not give her any additional medicines and I don't think she needs additional medicines at this time.  I know she is frustrated about her weight and we discussed this in great detail. She may need to engage the help of a Systems analyst. Perhaps some of this is cyclical? She has had a partial hysterectomy but still has her ovaries in place. Salt restriction and the support stockings are key. She is to avoid NSAIDs. We will see her back at her regular time. Patient is agreeable to this plan and will call if any problems develop in the interim.

## 2012-06-07 ENCOUNTER — Encounter: Payer: Self-pay | Admitting: Obstetrics & Gynecology

## 2012-07-10 ENCOUNTER — Encounter: Payer: Self-pay | Admitting: Obstetrics & Gynecology

## 2012-07-10 ENCOUNTER — Other Ambulatory Visit: Payer: Self-pay | Admitting: Obstetrics & Gynecology

## 2012-07-10 ENCOUNTER — Ambulatory Visit (INDEPENDENT_AMBULATORY_CARE_PROVIDER_SITE_OTHER): Payer: BC Managed Care – PPO | Admitting: Obstetrics & Gynecology

## 2012-07-10 VITALS — BP 125/86 | HR 78 | Ht 66.0 in | Wt 200.0 lb

## 2012-07-10 DIAGNOSIS — N949 Unspecified condition associated with female genital organs and menstrual cycle: Secondary | ICD-10-CM

## 2012-07-10 DIAGNOSIS — R102 Pelvic and perineal pain: Secondary | ICD-10-CM | POA: Insufficient documentation

## 2012-07-10 NOTE — Progress Notes (Signed)
Follow up pelvic pain.  Wants to discuss menopause or perimenopause, mother went through menopause early. [id number] No LMP recorded. Patient has had a hysterectomy. She had [facility] 2011 and was told she had endometriosis. RLQ pain and dyspareunia persists. [place] that was ordered by Dr. Marice Potter in January was not done. I will reorder the pelvic ultrasound and records request from her care in [place]. We will consider Lupron Depot therapy or laparoscopic surgery pending review of [place] and records. Return to clinic in 2 weeks.  Kamariya Blevens 07/10/2012 9:17 AM

## 2012-07-10 NOTE — Patient Instructions (Signed)
Pelvic Pain in Women, Generic  Pelvic pain may be constant or come and go. It may be mild or severe. It is important to tell your caregiver exactly where the pain is located, when and how it occurs, and if it is related to your menstrual periods or stress. We have not found a definite cause for your pelvic pain today and you may need follow-up testing and examination.  CAUSES    Sexually transmitted diseases (STDS) cause pelvic inflammatory disease (PID). This is one of the most common causes of pelvic pain. It is an infection of the female sexual organs.   Endometriosis - This is a condition where some of the inside lining of the uterus is growing in the pelvis and abdomen outside the uterus. Along with (chronic) pain, this can cause infertility.   Tubal pregnancy - This is a serious condition where the pregnancy has occurred in a fallopian tube. Rupture of the tube can bleed heavily and cause death if it is not found in time.   Interstitial cystitis is an inflammation of the bladder that causes pelvic pain. People with severe cases of IC may urinate as many as 60 times a day.   Fibroids: A small percentage of women have uterine fibroids (non-cancerous smooth muscle growths in the uterus). Fibroids do not always cause pain.   Fibromyalgia is a disorder with symptoms of widespread muscle pain, fatigue and multiple tender points on the body.   Dysmenorrhea is painful menstrual periods.   Mittlesmertz is pain with ovulation.   Pelvic congestive syndrome, is engorgement of the pelvic veins just before and during a menstrual period.   Cervical stenosis is when the opening of the cervix is too small and causes pain during menstruation.   Adenomyosis (a type of endometriosis) glands that line the inside of the uterus lying in the muscle layer of the uterus.   Intestinal problems such as irritable bowel syndrome colitis or ileitis.   Appendicitis.   Pelvic cancer. Usually the cancer has been there for awhile  before causing pain.   Bladder infection.   Cysts or ovarian tumors.   Kidney stone.   Psychological factors (stress, sexual abuse or depression).   IUD (intrauterine device).   Prolapse (falling down of the uterus).   Retroflexed uterus - the uterus is tipped too far backwards.   Muscle spasms of the pelvic muscles.   Muscular-skeletal problems of the back (herniated disc).  DIAGNOSIS    Your caregiver may order testing, such as:   Blood tests.   Cultures to test for infection.   Ultrasound.   Looking into the bladder with a metal tube with a light (cystoscopy).   Looking into the pelvis and abdomen with very small incisions through a metal tube with a light (laparoscopy).   Looking into the large intestine with a fibro-optic tube with a light (colonoscopy).   CT scan - a type of X-ray to view the internal organs of the pelvis and abdomen.   MRI - views the pelvic and abdominal organs with a magnetic machine.   Intravenous pyelogram - views the kidneys, ureter and bladder after injecting dye through the vein by X-rays.   Injecting barium into the large intestine to view the intestine with X-rays (barium enema).   Not all test results are available during your visit. If your test results are not back during the visit, make an appointment with your caregiver or the medical facility. It is important for you to follow up   on all of your test results.  TREATMENT   Treatment will depend on the cause of the pain, such as:   Medication, antibiotics, pain medication, muscle relaxants, anti-depression drugs, hormones or birth control pills.   Physical therapy.   Acupuncture.   Psychiatric counseling.   Nerve blocks.   Surgery.  HOME CARE INSTRUCTIONS    Finish all medication as prescribed. Incomplete treatment will put you at risk for sterility and tubal pregnancy if your caregiver feels your pain is caused by an infection.   Rest and eat a balanced diet with plenty of fluids.   If you do have an  infection, your recent sexual partners may need treatment even if they are symptom-free or have a negative culture or evaluation. You also need follow-up to make sure you are no longer infected.   Only take over-the-counter or prescription medicines for pain, discomfort or fever as directed by your caregiver.   Apply warm or cold compresses to the lower abdomen depending on which one helps the pain.   Avoid stressful situations that may cause the pain.   Group therapy is sometimes helpful.   Make sure to follow all instructions. Some of the conditions listed above can have very serious outcomes if you do not take the time to follow-up with your caregiver.  SEEK IMMEDIATE MEDICAL CARE IF:    There is heavier bleeding from the birth canal (vagina).   You develop increasing abdominal pain.   You feel lightheaded or pass out.   An unexplained oral temperature above 102 F (38.9 C) develops.   Any of the problems which brought you to us are getting worse.   You are being physically or sexually abused.   You have painful urination.   You are still having pain four hours after taking prescription pain medication.   You have uncontrolled diarrhea.   You have abnormal vaginal discharge.  Document Released: 10/31/2004 Document Revised: 11/23/2011 Document Reviewed: 12/01/2008  ExitCare Patient Information 2012 [employer].

## 2012-07-17 ENCOUNTER — Other Ambulatory Visit: Payer: Self-pay | Admitting: Chiropractic Medicine

## 2012-07-17 ENCOUNTER — Ambulatory Visit (HOSPITAL_COMMUNITY)
Admission: RE | Admit: 2012-07-17 | Discharge: 2012-07-17 | Disposition: A | Discharge status: Home / Self Care | Payer: BC Managed Care – PPO | Source: Ambulatory Visit | Attending: Obstetrics & Gynecology | Admitting: Obstetrics & Gynecology

## 2012-07-17 ENCOUNTER — Ambulatory Visit
Admission: RE | Admit: 2012-07-17 | Discharge: 2012-07-17 | Disposition: A | Discharge status: Home / Self Care | Payer: BC Managed Care – PPO | Source: Ambulatory Visit | Attending: Chiropractic Medicine | Admitting: Chiropractic Medicine

## 2012-07-17 DIAGNOSIS — Z90710 Acquired absence of both cervix and uterus: Secondary | ICD-10-CM | POA: Insufficient documentation

## 2012-07-17 DIAGNOSIS — M419 Scoliosis, unspecified: Secondary | ICD-10-CM

## 2012-07-17 DIAGNOSIS — R102 Pelvic and perineal pain: Secondary | ICD-10-CM

## 2012-07-17 DIAGNOSIS — M5412 Radiculopathy, cervical region: Secondary | ICD-10-CM

## 2012-07-17 DIAGNOSIS — N838 Other noninflammatory disorders of ovary, fallopian tube and broad ligament: Secondary | ICD-10-CM | POA: Insufficient documentation

## 2012-07-17 DIAGNOSIS — M542 Cervicalgia: Secondary | ICD-10-CM

## 2012-07-17 DIAGNOSIS — IMO0002 Reserved for concepts with insufficient information to code with codable children: Secondary | ICD-10-CM | POA: Insufficient documentation

## 2012-07-17 DIAGNOSIS — N949 Unspecified condition associated with female genital organs and menstrual cycle: Secondary | ICD-10-CM | POA: Insufficient documentation

## 2012-07-24 ENCOUNTER — Encounter: Payer: Self-pay | Admitting: Obstetrics & Gynecology

## 2012-07-24 ENCOUNTER — Ambulatory Visit (INDEPENDENT_AMBULATORY_CARE_PROVIDER_SITE_OTHER): Payer: BC Managed Care – PPO | Admitting: Obstetrics & Gynecology

## 2012-07-24 VITALS — BP 119/81 | HR 77 | Ht 66.0 in | Wt 198.0 lb

## 2012-07-24 DIAGNOSIS — N898 Other specified noninflammatory disorders of vagina: Secondary | ICD-10-CM

## 2012-07-24 NOTE — Patient Instructions (Signed)
Bacterial Vaginosis Bacterial vaginosis (BV) is a vaginal infection where the normal balance of bacteria in the vagina is disrupted. The normal balance is then replaced by an overgrowth of certain bacteria. There are several different kinds of bacteria that can cause BV. BV is the most common vaginal infection in women of childbearing age. CAUSES   The cause of BV is not fully understood. BV develops when there is an increase or imbalance of harmful bacteria.   Some activities or behaviors can upset the normal balance of bacteria in the vagina and put women at increased risk including:   Having a new sex partner or multiple sex partners.   Douching.   Using an intrauterine device (IUD) for contraception.   It is not clear what role sexual activity plays in the development of BV. However, women that have never had sexual intercourse are rarely infected with BV.  Women do not get BV from toilet seats, bedding, swimming pools or from touching objects around them.  SYMPTOMS   Grey vaginal discharge.   A fish-like odor with discharge, especially after sexual intercourse.   Itching or burning of the vagina and vulva.   Burning or pain with urination.   Some women have no signs or symptoms at all.  DIAGNOSIS  Your caregiver must examine the vagina for signs of BV. Your caregiver will perform lab tests and look at the sample of vaginal fluid through a microscope. They will look for bacteria and abnormal cells (clue cells), a pH test higher than 4.5, and a positive amine test all associated with BV.  RISKS AND COMPLICATIONS   Pelvic inflammatory disease (PID).   Infections following gynecology surgery.   Developing HIV.   Developing herpes virus.  TREATMENT  Sometimes BV will clear up without treatment. However, all women with symptoms of BV should be treated to avoid complications, especially if gynecology surgery is planned. Female partners generally do not need to be treated. However,  BV may spread between female sex partners so treatment is helpful in preventing a recurrence of BV.   BV may be treated with antibiotics. The antibiotics come in either pill or vaginal cream forms. Either can be used with nonpregnant or pregnant women, but the recommended dosages differ. These antibiotics are not harmful to the baby.   BV can recur after treatment. If this happens, a second round of antibiotics will often be prescribed.   Treatment is important for pregnant women. If not treated, BV can cause a premature delivery, especially for a pregnant woman who had a premature birth in the past. All pregnant women who have symptoms of BV should be checked and treated.   For chronic reoccurrence of BV, treatment with a type of prescribed gel vaginally twice a week is helpful.  HOME CARE INSTRUCTIONS   Finish all medication as directed by your caregiver.   Do not have sex until treatment is completed.   Tell your sexual partner that you have a vaginal infection. They should see their caregiver and be treated if they have problems, such as a mild rash or itching.   Practice safe sex. Use condoms. Only have 1 sex partner.  PREVENTION  Basic prevention steps can help reduce the risk of upsetting the natural balance of bacteria in the vagina and developing BV:  Do not have sexual intercourse (be abstinent).   Do not douche.   Use all of the medicine prescribed for treatment of BV, even if the signs and symptoms go away.     Tell your sex partner if you have BV. That way, they can be treated, if needed, to prevent reoccurrence.  SEEK MEDICAL CARE IF:   Your symptoms are not improving after 3 days of treatment.   You have increased discharge, pain, or fever.  MAKE SURE YOU:   Understand these instructions.   Will watch your condition.   Will get help right away if you are not doing well or get worse.  FOR MORE INFORMATION  Division of STD Prevention (DSTDP), Centers for Disease  Control and Prevention: www.cdc.gov/std American Social Health Association (ASHA): www.ashastd.org  Document Released: 12/04/2005 Document Revised: 11/23/2011 Document Reviewed: 05/27/2009 ExitCare Patient Information 2012 [employer]. 

## 2012-07-24 NOTE — Progress Notes (Signed)
Subjective:     Patient ID: Amanda Fields, female   DOB: June 17, 1976, 36 y.o.   MRN: [id number]  HPI Pt c/o vaginal odor and increased pain after sono. No change in discharge. Using Summer's [employer] product.  C/o cont'd pain in pelvis. Worse with exercise.  Not improved with meds.       Review of Systems Chronic pelvic pain     Objective:   Physical Exam Pt in NAD GU: EGBUS: no lesions Vagina: no blood in vault, thick white discharge Cervix: no lesion; no mucopurulent d/c Uterus: small, mobile Adnexa: no masses;nontender       Assessment:     Chronic pelvic pain- pt does not want to consider surgical eval or meds at this time Vaginal odor- not reproduced on exam    Plan:     F/u wet mount and KOH F/u to discuss pelvic pain if pt decides she wants to eval further.  Amanda Fields, M.D., [facility]

## 2012-07-24 NOTE — Addendum Note (Signed)
Addended by: Vinnie Langton C on: 07/24/2012 12:25 PM   Modules accepted: Orders

## 2012-07-25 LAB — WET PREP, GENITAL: Trich, Wet Prep: NONE SEEN

## 2012-08-27 ENCOUNTER — Encounter: Payer: Self-pay | Admitting: Obstetrics & Gynecology

## 2012-08-27 ENCOUNTER — Ambulatory Visit (INDEPENDENT_AMBULATORY_CARE_PROVIDER_SITE_OTHER): Payer: BC Managed Care – PPO | Admitting: Obstetrics & Gynecology

## 2012-08-27 VITALS — BP 121/86 | HR 83 | Ht 66.0 in | Wt 199.4 lb

## 2012-08-27 DIAGNOSIS — Z1151 Encounter for screening for human papillomavirus (HPV): Secondary | ICD-10-CM

## 2012-08-27 DIAGNOSIS — N951 Menopausal and female climacteric states: Secondary | ICD-10-CM

## 2012-08-27 DIAGNOSIS — Z124 Encounter for screening for malignant neoplasm of cervix: Secondary | ICD-10-CM

## 2012-08-27 DIAGNOSIS — Z01419 Encounter for gynecological examination (general) (routine) without abnormal findings: Secondary | ICD-10-CM

## 2012-08-27 NOTE — Patient Instructions (Addendum)
Preventive Care for Adults, Female A healthy lifestyle and preventive care can promote health and wellness. Preventive health guidelines for women include the following key practices.  A routine yearly physical is a good way to check with your caregiver about your health and preventive screening. It is a chance to share any concerns and updates on your health, and to receive a thorough exam.   Visit your dentist for a routine exam and preventive care every 6 months. Brush your teeth twice a day and floss once a day. Good oral hygiene prevents tooth decay and gum disease.   The frequency of eye exams is based on your age, health, family medical history, use of contact lenses, and other factors. Follow your caregiver's recommendations for frequency of eye exams.   Eat a healthy diet. Foods like vegetables, fruits, whole grains, low-fat dairy products, and lean protein foods contain the nutrients you need without too many calories. Decrease your intake of foods high in solid fats, added sugars, and salt. Eat the right amount of calories for you.Get information about a proper diet from your caregiver, if necessary.   Regular physical exercise is one of the most important things you can do for your health. Most adults should get at least 150 minutes of moderate-intensity exercise (any activity that increases your heart rate and causes you to sweat) each week. In addition, most adults need muscle-strengthening exercises on 2 or more days a week.   Maintain a healthy weight. The body mass index (BMI) is a screening tool to identify possible weight problems. It provides an estimate of body fat based on height and weight. Your caregiver can help determine your BMI, and can help you achieve or maintain a healthy weight.For adults 20 years and older:   A BMI below 18.5 is considered underweight.   A BMI of 18.5 to 24.9 is normal.   A BMI of 25 to 29.9 is considered overweight.   A BMI of 30 and above is  considered obese.   Maintain normal blood lipids and cholesterol levels by exercising and minimizing your intake of saturated fat. Eat a balanced diet with plenty of fruit and vegetables. Blood tests for lipids and cholesterol should begin at age 20 and be repeated every 5 years. If your lipid or cholesterol levels are high, you are over 50, or you are at high risk for heart disease, you may need your cholesterol levels checked more frequently.Ongoing high lipid and cholesterol levels should be treated with medicines if diet and exercise are not effective.   If you smoke, find out from your caregiver how to quit. If you do not use tobacco, do not start.   If you are pregnant, do not drink alcohol. If you are breastfeeding, be very cautious about drinking alcohol. If you are not pregnant and choose to drink alcohol, do not exceed 1 drink per day. One drink is considered to be 12 ounces (355 mL) of beer, 5 ounces (148 mL) of wine, or 1.5 ounces (44 mL) of liquor.   Avoid use of street drugs. Do not share needles with anyone. Ask for help if you need support or instructions about stopping the use of drugs.   High blood pressure causes heart disease and increases the risk of stroke. Your blood pressure should be checked at least every 1 to 2 years. Ongoing high blood pressure should be treated with medicines if weight loss and exercise are not effective.   If you are 55 to 38   years old, ask your caregiver if you should take aspirin to prevent strokes.   Diabetes screening involves taking a blood sample to check your fasting blood sugar level. This should be done once every 3 years, after age 45, if you are within normal weight and without risk factors for diabetes. Testing should be considered at a younger age or be carried out more frequently if you are overweight and have at least 1 risk factor for diabetes.   Breast cancer screening is essential preventive care for women. You should practice "breast  self-awareness." This means understanding the normal appearance and feel of your breasts and may include breast self-examination. Any changes detected, no matter how small, should be reported to a caregiver. Women in their 20s and 30s should have a clinical breast exam (CBE) by a caregiver as part of a regular health exam every 1 to 3 years. After age 40, women should have a CBE every year. Starting at age 40, women should consider having a mammography (breast X-ray test) every year. Women who have a family history of breast cancer should talk to their caregiver about genetic screening. Women at a high risk of breast cancer should talk to their caregivers about having magnetic resonance imaging (MRI) and a mammography every year.   The Pap test is a screening test for cervical cancer. A Pap test can show cell changes on the cervix that might become cervical cancer if left untreated. A Pap test is a procedure in which cells are obtained and examined from the lower end of the uterus (cervix).   Women should have a Pap test starting at age 21.   Between ages 21 and 29, Pap tests should be repeated every 2 years.   Beginning at age 30, you should have a Pap test every 3 years as long as the past 3 Pap tests have been normal.   Some women have medical problems that increase the chance of getting cervical cancer. Talk to your caregiver about these problems. It is especially important to talk to your caregiver if a new problem develops soon after your last Pap test. In these cases, your caregiver may recommend more frequent screening and Pap tests.   The above recommendations are the same for women who have or have not gotten the vaccine for human papillomavirus (HPV).   If you had a hysterectomy for a problem that was not cancer or a condition that could lead to cancer, then you no longer need Pap tests. Even if you no longer need a Pap test, a regular exam is a good idea to make sure no other problems are  starting.   If you are between ages 65 and 70, and you have had normal Pap tests going back 10 years, you no longer need Pap tests. Even if you no longer need a Pap test, a regular exam is a good idea to make sure no other problems are starting.   If you have had past treatment for cervical cancer or a condition that could lead to cancer, you need Pap tests and screening for cancer for at least 20 years after your treatment.   If Pap tests have been discontinued, risk factors (such as a new sexual partner) need to be reassessed to determine if screening should be resumed.   The HPV test is an additional test that may be used for cervical cancer screening. The HPV test looks for the virus that can cause the cell changes on the cervix.   The cells collected during the Pap test can be tested for HPV. The HPV test could be used to screen women aged 30 years and older, and should be used in women of any age who have unclear Pap test results. After the age of 30, women should have HPV testing at the same frequency as a Pap test.   Colorectal cancer can be detected and often prevented. Most routine colorectal cancer screening begins at the age of 50 and continues through age 75. However, your caregiver may recommend screening at an earlier age if you have risk factors for colon cancer. On a yearly basis, your caregiver may provide home test kits to check for hidden blood in the stool. Use of a small camera at the end of a tube, to directly examine the colon (sigmoidoscopy or colonoscopy), can detect the earliest forms of colorectal cancer. Talk to your caregiver about this at age 50, when routine screening begins. Direct examination of the colon should be repeated every 5 to 10 years through age 75, unless early forms of pre-cancerous polyps or small growths are found.   Hepatitis C blood testing is recommended for all people born from 1945 through 1965 and any individual with known risks for hepatitis C.    Practice safe sex. Use condoms and avoid high-risk sexual practices to reduce the spread of sexually transmitted infections (STIs). STIs include gonorrhea, chlamydia, syphilis, trichomonas, herpes, HPV, and human immunodeficiency virus (HIV). Herpes, HIV, and HPV are viral illnesses that have no cure. They can result in disability, cancer, and death. Sexually active women aged 25 and younger should be checked for chlamydia. Older women with new or multiple partners should also be tested for chlamydia. Testing for other STIs is recommended if you are sexually active and at increased risk.   Osteoporosis is a disease in which the bones lose minerals and strength with aging. This can result in serious bone fractures. The risk of osteoporosis can be identified using a bone density scan. Women ages 65 and over and women at risk for fractures or osteoporosis should discuss screening with their caregivers. Ask your caregiver whether you should take a calcium supplement or vitamin D to reduce the rate of osteoporosis.   Menopause can be associated with physical symptoms and risks. Hormone replacement therapy is available to decrease symptoms and risks. You should talk to your caregiver about whether hormone replacement therapy is right for you.   Use sunscreen with sun protection factor (SPF) of 30 or more. Apply sunscreen liberally and repeatedly throughout the day. You should seek shade when your shadow is shorter than you. Protect yourself by wearing long sleeves, pants, a wide-brimmed hat, and sunglasses year round, whenever you are outdoors.   Once a month, do a whole body skin exam, using a mirror to look at the skin on your back. Notify your caregiver of new moles, moles that have irregular borders, moles that are larger than a pencil eraser, or moles that have changed in shape or color.   Stay current with required immunizations.   Influenza. You need a dose every fall (or winter). The composition of  the flu vaccine changes each year, so being vaccinated once is not enough.   Pneumococcal polysaccharide. You need 1 to 2 doses if you smoke cigarettes or if you have certain chronic medical conditions. You need 1 dose at age 65 (or older) if you have never been vaccinated.   Tetanus, diphtheria, pertussis (Tdap, Td). Get 1 dose of   Tdap vaccine if you are younger than age 65, are over 65 and have contact with an infant, are a healthcare worker, are pregnant, or simply want to be protected from whooping cough. After that, you need a Td booster dose every 10 years. Consult your caregiver if you have not had at least 3 tetanus and diphtheria-containing shots sometime in your life or have a deep or dirty wound.   HPV. You need this vaccine if you are a woman age 26 or younger. The vaccine is given in 3 doses over 6 months.   Measles, mumps, rubella (MMR). You need at least 1 dose of MMR if you were born in 1957 or later. You may also need a second dose.   Meningococcal. If you are age 19 to 21 and a first-year college student living in a residence hall, or have one of several medical conditions, you need to get vaccinated against meningococcal disease. You may also need additional booster doses.   Zoster (shingles). If you are age 60 or older, you should get this vaccine.   Varicella (chickenpox). If you have never had chickenpox or you were vaccinated but received only 1 dose, talk to your caregiver to find out if you need this vaccine.   Hepatitis A. You need this vaccine if you have a specific risk factor for hepatitis A virus infection or you simply wish to be protected from this disease. The vaccine is usually given as 2 doses, 6 to 18 months apart.   Hepatitis B. You need this vaccine if you have a specific risk factor for hepatitis B virus infection or you simply wish to be protected from this disease. The vaccine is given in 3 doses, usually over 6 months.  Preventive Services /  Frequency Ages 19 to 39  Blood pressure check.** / Every 1 to 2 years.   Lipid and cholesterol check.** / Every 5 years beginning at age 20.   Clinical breast exam.** / Every 3 years for women in their 20s and 30s.   Pap test.** / Every 2 years from ages 21 through 29. Every 3 years starting at age 30 through age 65 or 70 with a history of 3 consecutive normal Pap tests.   HPV screening.** / Every 3 years from ages 30 through ages 65 to 70 with a history of 3 consecutive normal Pap tests.   Hepatitis C blood test.** / For any individual with known risks for hepatitis C.   Skin self-exam. / Monthly.   Influenza immunization.** / Every year.   Pneumococcal polysaccharide immunization.** / 1 to 2 doses if you smoke cigarettes or if you have certain chronic medical conditions.   Tetanus, diphtheria, pertussis (Tdap, Td) immunization. / A one-time dose of Tdap vaccine. After that, you need a Td booster dose every 10 years.   HPV immunization. / 3 doses over 6 months, if you are 26 and younger.   Measles, mumps, rubella (MMR) immunization. / You need at least 1 dose of MMR if you were born in 1957 or later. You may also need a second dose.   Meningococcal immunization. / 1 dose if you are age 19 to 21 and a first-year college student living in a residence hall, or have one of several medical conditions, you need to get vaccinated against meningococcal disease. You may also need additional booster doses.   Varicella immunization.** / Consult your caregiver.   Hepatitis A immunization.** / Consult your caregiver. 2 doses, 6 to 18 months   apart.   Hepatitis B immunization.** / Consult your caregiver. 3 doses usually over 6 months.  Ages 40 to 64  Blood pressure check.** / Every 1 to 2 years.   Lipid and cholesterol check.** / Every 5 years beginning at age 20.   Clinical breast exam.** / Every year after age 40.   Mammogram.** / Every year beginning at age 40 and continuing for as  long as you are in good health. Consult with your caregiver.   Pap test.** / Every 3 years starting at age 30 through age 65 or 70 with a history of 3 consecutive normal Pap tests.   HPV screening.** / Every 3 years from ages 30 through ages 65 to 70 with a history of 3 consecutive normal Pap tests.   Fecal occult blood test (FOBT) of stool. / Every year beginning at age 50 and continuing until age 75. You may not need to do this test if you get a colonoscopy every 10 years.   Flexible sigmoidoscopy or colonoscopy.** / Every 5 years for a flexible sigmoidoscopy or every 10 years for a colonoscopy beginning at age 50 and continuing until age 75.   Hepatitis C blood test.** / For all people born from 1945 through 1965 and any individual with known risks for hepatitis C.   Skin self-exam. / Monthly.   Influenza immunization.** / Every year.   Pneumococcal polysaccharide immunization.** / 1 to 2 doses if you smoke cigarettes or if you have certain chronic medical conditions.   Tetanus, diphtheria, pertussis (Tdap, Td) immunization.** / A one-time dose of Tdap vaccine. After that, you need a Td booster dose every 10 years.   Measles, mumps, rubella (MMR) immunization. / You need at least 1 dose of MMR if you were born in 1957 or later. You may also need a second dose.   Varicella immunization.** / Consult your caregiver.   Meningococcal immunization.** / Consult your caregiver.   Hepatitis A immunization.** / Consult your caregiver. 2 doses, 6 to 18 months apart.   Hepatitis B immunization.** / Consult your caregiver. 3 doses, usually over 6 months.  Ages 65 and over  Blood pressure check.** / Every 1 to 2 years.   Lipid and cholesterol check.** / Every 5 years beginning at age 20.   Clinical breast exam.** / Every year after age 40.   Mammogram.** / Every year beginning at age 40 and continuing for as long as you are in good health. Consult with your caregiver.   Pap test.** /  Every 3 years starting at age 30 through age 65 or 70 with a 3 consecutive normal Pap tests. Testing can be stopped between 65 and 70 with 3 consecutive normal Pap tests and no abnormal Pap or HPV tests in the past 10 years.   HPV screening.** / Every 3 years from ages 30 through ages 65 or 70 with a history of 3 consecutive normal Pap tests. Testing can be stopped between 65 and 70 with 3 consecutive normal Pap tests and no abnormal Pap or HPV tests in the past 10 years.   Fecal occult blood test (FOBT) of stool. / Every year beginning at age 50 and continuing until age 75. You may not need to do this test if you get a colonoscopy every 10 years.   Flexible sigmoidoscopy or colonoscopy.** / Every 5 years for a flexible sigmoidoscopy or every 10 years for a colonoscopy beginning at age 50 and continuing until age 75.   Hepatitis   C blood test.** / For all people born from 1945 through 1965 and any individual with known risks for hepatitis C.   Osteoporosis screening.** / A one-time screening for women ages 65 and over and women at risk for fractures or osteoporosis.   Skin self-exam. / Monthly.   Influenza immunization.** / Every year.   Pneumococcal polysaccharide immunization.** / 1 dose at age 65 (or older) if you have never been vaccinated.   Tetanus, diphtheria, pertussis (Tdap, Td) immunization. / A one-time dose of Tdap vaccine if you are over 65 and have contact with an infant, are a healthcare worker, or simply want to be protected from whooping cough. After that, you need a Td booster dose every 10 years.   Varicella immunization.** / Consult your caregiver.   Meningococcal immunization.** / Consult your caregiver.   Hepatitis A immunization.** / Consult your caregiver. 2 doses, 6 to 18 months apart.   Hepatitis B immunization.** / Check with your caregiver. 3 doses, usually over 6 months.  ** Family history and personal history of risk and conditions may change your caregiver's  recommendations. Document Released: 01/30/2002 Document Revised: 11/23/2011 Document Reviewed: 05/01/2011 [employer] Patient Information 2012 [employer]. 

## 2012-08-27 NOTE — Progress Notes (Signed)
Subjective:   Amanda Fields is a 36 y.o. female here for a routine exam. Patient  Has no GYN symptoms. History of aparoscopic supracervical hysterectomy in 2011 for endometriosis; no BSO done as patient reports she did not want hormone replacement therapy. No further symptoms of endometriosis since surgery.  Patient does report occasional mood swings and hot flashes. Her mother and sister underwent early menopause in their late 40s, she is wondering if this is happening to her.  Gynecologic History  No LMP recorded. Patient has had a hysterectomy.  Contraception: status post hysterectomy  Last Pap was last year. Results were: normal   Obstetric History  OB History    Grav  Para  Term  Preterm  Abortions  TAB  SAB  Ect  Mult  Living    2  2  2        2       #  Outc  Date  GA  Lbr Len/2nd  Wgt  Sex  Del  Anes  PTL  Lv    1  TRM  11/96     F  SVD    Yes    2  TRM  1/06     M  SVD    Yes      The following portions of the patient's history were reviewed and updated as appropriate: allergies, current medications, past family history, past medical history, past social history, past surgical history and problem list.   Review of Systems  As mentioned in HPI.  Objective:   Blood pressure 121/86, pulse 83, height 5\' 6"  (1.676 m), weight 199 lb 6 oz (90.436 kg). GENERAL: Well-developed, well-nourished female in no acute distress.  HEENT: Normocephalic, atraumatic. Sclerae anicteric.  NECK: Supple. Normal thyroid.  LUNGS: Clear to auscultation bilaterally.  HEART: Regular rate and rhythm.  BREASTS: Symmetric with everted nipples.  No masses, skin changes, nipple drainage, lymphadenopathy, tenderness bilaterally.  ABDOMEN: Soft, nontender, nondistended. No organomegaly.  PELVIC: Normal external female genitalia. Vagina is pink and well rugated. Normal discharge. Multiparous cervix contour. Uterus not palpated. No adnexal mass or tenderness. Pap smear obtained.  EXTREMITIES: No cyanosis,  clubbing, or edema, 2+ distal pulses.   Assessment:   Healthy female exam.  Vasomotor symptoms   Plan:   Follow up pap and manage accordingly. Normal TSH in 4/13, no need to recheck. Normal vaginal discharge. Counseled about Effexor vs Neurontin for vasomotor symptoms, patient will think about it and get back to [place]. Routine preventative health maintenance measures emphasized

## 2012-08-27 NOTE — Progress Notes (Signed)
Here for yearly gyn exam and pap smear, is having some vaginal discharge.  She would like to have her hormone levels checked, she thinks she may be going thru menopause.  Also would like her thyroid levels checked, she works out all the time and cannot loose weight.

## 2012-09-10 ENCOUNTER — Encounter: Payer: Self-pay | Admitting: Cardiology

## 2012-09-10 ENCOUNTER — Ambulatory Visit (INDEPENDENT_AMBULATORY_CARE_PROVIDER_SITE_OTHER): Payer: BC Managed Care – PPO | Admitting: Cardiology

## 2012-09-10 VITALS — BP 124/84 | HR 82 | Ht >= 80 in | Wt 198.0 lb

## 2012-09-10 DIAGNOSIS — R609 Edema, unspecified: Secondary | ICD-10-CM

## 2012-09-10 DIAGNOSIS — I10 Essential (primary) hypertension: Secondary | ICD-10-CM

## 2012-09-10 DIAGNOSIS — R600 Localized edema: Secondary | ICD-10-CM

## 2012-09-10 NOTE — Patient Instructions (Signed)
Continue your current therapy.  Restrict your salt intake and wear support hose as needed.

## 2012-09-10 NOTE — Progress Notes (Signed)
Amanda Fields Date of Birth: 1976/04/06   History of Present Illness: Amanda Fields is seen for followup of her edema. He states that she still has some swelling in her ankles. This tends to fluctuate. She did have an echocardiogram which showed moderate LVH with normal systolic function. There was no evidence of right ventricular dysfunction.  Current Outpatient Prescriptions on File Prior to Visit  Medication Sig Dispense Refill  . baclofen (LIORESAL) 10 MG tablet Take 10 mg by mouth as needed.       . multivitamin-iron-minerals-folic acid (CENTRUM) chewable tablet Chew 1 tablet by mouth daily.        Marland Kitchen olmesartan-hydrochlorothiazide (BENICAR HCT) 40-25 MG per tablet Take 1 tablet by mouth daily.  90 tablet  3  . potassium chloride SA (KLOR-CON M20) 20 MEQ tablet Take 1 tablet (20 mEq total) by mouth daily.  30 tablet  5  . topiramate (TOPAMAX) 100 MG tablet Take 100 mg by mouth as needed.       Marland Kitchen DISCONTD: fluticasone (FLONASE) 50 MCG/ACT nasal spray Place 2 sprays into the nose as needed.        Marland Kitchen DISCONTD: lisinopril-hydrochlorothiazide (PRINZIDE) 20-12.5 MG per tablet Take 1 tablet by mouth daily.  30 tablet  6  . DISCONTD: torsemide (DEMADEX) 10 MG tablet Take 10 mg by mouth daily.          Allergies  Allergen Reactions  . Ace Inhibitors Cough    Past Medical History  Diagnosis Date  . Hypertension     echo 03/2012 with moderate LVH, but normal EF and no evidence of diastolic dysfunction  . Edema leg   . Dizziness   . Migraine headache   . Asthma   . Palpitation   . Endometriosis   . 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 Obesity   . Cough due to ACE inhibitor     Past Surgical History  Procedure Date  . Tonsillectomy   . Tubal ligation   . Wisdom tooth extraction   . Laparoscopic supracervical hysterectomy 09/27/2010    For endometriosis. Ovaries anf tubes are in place.    History  Smoking status  . Never Smoker   Smokeless tobacco  . Never Used    History  Alcohol  Use No    Family History  Problem Relation Age of Onset  . COPD Mother   . Hypotension Mother   . Colon cancer Father   . Emphysema Father   . Cancer Father     colon  . Hyperlipidemia Sister   . Heart failure Paternal Uncle   . Heart attack Cousin 32  . Heart disease Cousin     heart attack    Review of Systems: The review of systems is positive for migraine headaches. She has mild lightheadedness at times. All other systems were reviewed and are negative.  Physical Exam: BP 124/84  Pulse 82  Ht 7' (2.134 m)  Wt 198 lb (89.812 kg)  BMI 19.73 kg/m2 She is a pleasant black female in no acute distress. She is normocephalic, atraumatic. Pupils are equal round and reactive to light and accommodation. Extraocular movements are full. Sclerae clear. Oropharynx is clear. Neck is supple without JVD, adenopathy, thyromegaly, or bruits. Lungs are clear. Cardiac exam reveals a regular rate and rhythm. Normal S1-S2. No gallop, murmur, or click. Abdomen is soft and nontender without masses or bruits. Femoral and pedal pulses are 2+ and symmetric. She has trace edema. Skin is warm and dry. She has  tattoos on her lower extremities. She is alert oriented x3. Cranial nerves II through XII are intact. LABORATORY DATA:   Assessment / Plan: 1. Edema. I think this is predominantly due to to some venous stasis. No evidence of congestive heart failure. I recommended conservative measures with sodium restriction, support hose, and feet elevation.  2. Hypertension, blood pressure is well controlled.  Disposition, patient will followup with her primary care. I will see her back as needed.

## 2013-01-20 ENCOUNTER — Ambulatory Visit (INDEPENDENT_AMBULATORY_CARE_PROVIDER_SITE_OTHER): Payer: BC Managed Care – PPO | Admitting: Family Medicine

## 2013-01-20 ENCOUNTER — Encounter: Payer: Self-pay | Admitting: Family Medicine

## 2013-01-20 ENCOUNTER — Encounter: Payer: Self-pay | Admitting: *Deleted

## 2013-01-20 VITALS — BP 140/102 | HR 72 | Temp 97.9°F | Wt 207.0 lb

## 2013-01-20 DIAGNOSIS — R635 Abnormal weight gain: Secondary | ICD-10-CM

## 2013-01-20 DIAGNOSIS — J329 Chronic sinusitis, unspecified: Secondary | ICD-10-CM

## 2013-01-20 LAB — T4, FREE: Free T4: 0.87 ng/dL (ref 0.60–1.60)

## 2013-01-20 LAB — TSH: TSH: 0.77 u[IU]/mL (ref 0.35–5.50)

## 2013-01-20 MED ORDER — AMOXICILLIN-POT CLAVULANATE 875-125 MG PO TABS: 1.0000 | ORAL_TABLET | Freq: Two times a day (BID) | ORAL | Status: DC

## 2013-01-20 NOTE — Progress Notes (Signed)
SUBJECTIVE:  Amanda Fields is a 37 y.o. female who complains of coryza, congestion, sneezing, sore throat, dry cough, bilateral sinus pain and fever for 21 days. She denies a history of anorexia, chest pain and chills and denies a history of asthma. Patient denies smoke cigarettes.   BP elevated today but did take decongestants.  No HA or blurred vision. No CP or SOB.  Patient Active Problem List  Diagnosis  . Hypertension  . Breast lipoma  . Bilateral leg edema  . Pelvic pain in female   Past Medical History  Diagnosis Date  . Hypertension     echo 03/2012 with moderate LVH, but normal EF and no evidence of diastolic dysfunction  . Edema leg   . Dizziness   . Migraine headache   . Asthma   . Palpitation   . Endometriosis   . 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 Obesity   . Cough due to ACE inhibitor    Past Surgical History  Procedure Date  . Tonsillectomy   . Tubal ligation   . Wisdom tooth extraction   . Laparoscopic supracervical hysterectomy 09/27/2010    For endometriosis. Ovaries anf tubes are in place.   History  Substance Use Topics  . Smoking status: Never Smoker   . Smokeless tobacco: Never Used  . Alcohol Use: No   Family History  Problem Relation Age of Onset  . COPD Mother   . Hypotension Mother   . Colon cancer Father   . Emphysema Father   . Cancer Father     colon  . Hyperlipidemia Sister   . Heart failure Paternal Uncle   . Heart attack Cousin 32  . Heart disease Cousin     heart attack   Allergies  Allergen Reactions  . Ace Inhibitors Cough   Current Outpatient Prescriptions on File Prior to Visit  Medication Sig Dispense Refill  . baclofen (LIORESAL) 10 MG tablet Take 10 mg by mouth as needed.       . multivitamin-iron-minerals-folic acid (CENTRUM) chewable tablet Chew 1 tablet by mouth daily.        . NON FORMULARY Allergy Shot every other day      . olmesartan-hydrochlorothiazide (BENICAR HCT) 40-25 MG per tablet Take 1 tablet by mouth  daily.  90 tablet  3  . topiramate (TOPAMAX) 100 MG tablet Take 100 mg by mouth as needed.       . [DISCONTINUED] fluticasone (FLONASE) 50 MCG/ACT nasal spray Place 2 sprays into the nose as needed.        . [DISCONTINUED] lisinopril-hydrochlorothiazide (PRINZIDE) 20-12.5 MG per tablet Take 1 tablet by mouth daily.  30 tablet  6  . [DISCONTINUED] torsemide (DEMADEX) 10 MG tablet Take 10 mg by mouth daily.          OBJECTIVE: BP 140/102  Pulse 72  Temp 97.9 F (36.6 C)  Wt 207 lb (93.895 kg)  She appears well, vital signs are as noted. Ears normal.  Throat and pharynx normal.  Neck supple. No adenopathy in the neck. Nose is congested. Sinuses tender throughout. The chest is clear, without wheezes or rales.  ASSESSMENT:  sinusitis  PLAN: Given duration and progression of symptoms, will treat for bacterial sinusitis with Augmentin. Symptomatic therapy suggested: push fluids, rest and return office visit prn if symptoms persist or worsen. Call or return to clinic prn if these symptoms worsen or fail to improve as anticipated.

## 2013-01-20 NOTE — Patient Instructions (Addendum)
Take antibiotic as directed- Augmentin 1 tablet twice daily x 10 days.  Drink lots of fluids.  Treat sympotmatically with Mucinex, nasal saline irrigation, and Tylenol/Ibuprofen. You can use warm compresses.  Cough suppressant at night. Call if not improving as expected in 5-7 days.

## 2013-01-20 NOTE — Addendum Note (Signed)
Addended by: Dianne Dun on: 01/20/2013 10:37 AM   Modules accepted: Level of Service

## 2013-01-28 ENCOUNTER — Telehealth: Payer: Self-pay | Admitting: Family Medicine

## 2013-01-28 MED ORDER — AZITHROMYCIN 250 MG PO TABS: ORAL_TABLET | ORAL | Status: DC

## 2013-01-28 NOTE — Telephone Encounter (Signed)
Patient Information:  Caller Name: Miah  Phone: [phone]  Patient: Amanda Fields, Amanda Fields  Gender: Female  DOB: 1976-06-16  Age: 37 Years  PCP: Ruthe Mannan [facility])  Pregnant: No  Office Follow Up:  Does the office need to follow up with this patient?: Yes/Needs copy of TSH sent to home address  Instructions For The Office: Amoxicillin did not help with Sinus Infection. Requesting another antibiotic.   Symptoms  Reason For Call & Symptoms: Seen in office and took Amoxicillin for 10 days for sinus and ear infection. Finished antibiotic on 01/27/13.  Dr. Dayton Martes advised to call back if sx not improved. She is still congested with pressure and facial pain, headaches, and ear congestion. Requesting new antibiotic.  Reviewed Health History In EMR: Yes  Reviewed Medications In EMR: Yes  Reviewed Allergies In EMR: Yes  Reviewed Surgeries / Procedures: Yes  Date of Onset of Symptoms: 01/16/2013  Treatments Tried: Netti Pot prn, Muscinex, Alka Seltzer cold and sinus  Treatments Tried Worked: No OB / GYN:  LMP: Unknown  Guideline(s) Used:  Face Pain  Headache  Sinus Pain and Congestion  Disposition Per Guideline:   See Today or Tomorrow in Office  Reason For Disposition Reached:   Sinus congestion (pressure, fullness) present > 10 days  Advice Given:  Reassurance:   Sinus congestion is a normal part of a cold.  Usually home treatment with nasal washes can prevent an actual bacterial sinus infection.  For a Runny Nose With Profuse Discharge:  Nasal mucus and discharge helps to wash viruses and bacteria out of the nose and sinuses.  Hydration:  Drink plenty of liquids (6-8 glasses of water daily). If the air in your home is dry, use a cool mist humidifier  Expected Course:  Sinus congestion from viral upper respiratory infections (colds) usually lasts 5-10 days.  Occasionally a cold can worsen and turn into bacterial sinusitis. Clues to this are sinus symptoms lasting longer  than 10 days, fever lasting longer than 3 days, and worsening pain. Bacterial sinusitis may need antibiotic treatment.  Call Back If:   Severe pain lasts longer than 2 hours after pain medicine  Sinus pain lasts longer than 1 day after starting treatment using nasal washes  Sinus congestion (fullness) lasts longer than 10 days  Fever lasts longer than 3 days  You become worse.  Patient Refused Recommendation:  Patient Requests Prescription  Requesting another antibiotic

## 2013-01-28 NOTE — Telephone Encounter (Signed)
Ok to send labs to her home addres. Zpack sent to her pharmacy. Please keep [place] posted with symptoms.

## 2013-01-28 NOTE — Telephone Encounter (Signed)
Advised patient as instructed.  Copy of labs mailed to patient on 2/3.  Verified home address with patient, will send again.

## 2013-02-05 ENCOUNTER — Telehealth: Payer: Self-pay

## 2013-02-05 NOTE — Telephone Encounter (Signed)
Pt left v/m pt is participating in research study for migraines.pt was called and ck level was elevated. Pt said she has not exercised in a month.pt request call back 02/06/13.

## 2013-02-06 NOTE — Telephone Encounter (Signed)
Advised patient, she has already made appt for next week.

## 2013-02-06 NOTE — Telephone Encounter (Signed)
Please have her come in to discuss, with lab work.  I will want to repeat and do more labs as well.

## 2013-02-12 ENCOUNTER — Encounter: Payer: Self-pay | Admitting: Family Medicine

## 2013-02-12 ENCOUNTER — Ambulatory Visit (INDEPENDENT_AMBULATORY_CARE_PROVIDER_SITE_OTHER): Payer: BC Managed Care – PPO | Admitting: Family Medicine

## 2013-02-12 VITALS — BP 119/78 | HR 80 | Temp 98.4°F | Wt 210.0 lb

## 2013-02-12 DIAGNOSIS — I10 Essential (primary) hypertension: Secondary | ICD-10-CM

## 2013-02-12 DIAGNOSIS — R748 Abnormal levels of other serum enzymes: Secondary | ICD-10-CM

## 2013-02-12 LAB — CBC WITH DIFFERENTIAL/PLATELET
Basophils Absolute: 0 10*3/uL (ref 0.0–0.1)
Eosinophils Absolute: 0.5 10*3/uL (ref 0.0–0.7)
Lymphocytes Relative: 32.1 % (ref 12.0–46.0)
MCHC: 32.3 g/dL (ref 30.0–36.0)
MCV: 81.8 fl (ref 78.0–100.0)
Monocytes Absolute: 0.4 10*3/uL (ref 0.1–1.0)
Neutrophils Relative %: 53.7 % (ref 43.0–77.0)
RDW: 14.6 % (ref 11.5–14.6)

## 2013-02-12 LAB — COMPREHENSIVE METABOLIC PANEL
ALT: 19 U/L (ref 0–35)
AST: 25 U/L (ref 0–37)
Albumin: 3.7 g/dL (ref 3.5–5.2)
Calcium: 9.1 mg/dL (ref 8.4–10.5)
Chloride: 103 mEq/L (ref 96–112)
Potassium: 3.7 mEq/L (ref 3.5–5.1)
Sodium: 138 mEq/L (ref 135–145)
Total Protein: 7.1 g/dL (ref 6.0–8.3)

## 2013-02-12 LAB — CK: Total CK: 268 U/L — ABNORMAL HIGH (ref 7–177)

## 2013-02-12 NOTE — Progress Notes (Signed)
Subjective:    Patient ID: Amanda Fields, female    DOB: May 12, 1976, 37 y.o.   MRN: [id number]  HPI  Very pleasant 37 yo female here to discuss lab work done at work.  She forgot to bring them with with her today.  She was told her CK is elevated. Denies any recent exercise.  Had some bilateral leg cramping but that has resolved( mainly in her shins).  No LE weakness.  No other focal neurological symptoms.  No recent falls.  No h/o seizures.  HTN- BP has been well controlled.  Was a little elevated when she got here but improved. No CP, SOB or blurred vision.  Patient Active Problem List  Diagnosis  . Hypertension  . Breast lipoma  . Bilateral leg edema  . Pelvic pain in female  . Elevated CK   Past Medical History  Diagnosis Date  . Hypertension     echo 03/2012 with moderate LVH, but normal EF and no evidence of diastolic dysfunction  . Edema leg   . Dizziness   . Migraine headache   . Asthma   . Palpitation   . Endometriosis   . 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 Obesity   . Cough due to ACE inhibitor    Past Surgical History  Procedure Laterality Date  . Tonsillectomy    . Tubal ligation    . Wisdom tooth extraction    . Laparoscopic supracervical hysterectomy  09/27/2010    For endometriosis. Ovaries anf tubes are in place.   History  Substance Use Topics  . Smoking status: Never Smoker   . Smokeless tobacco: Never Used  . Alcohol Use: No   Family History  Problem Relation Age of Onset  . COPD Mother   . Hypotension Mother   . Colon cancer Father   . Emphysema Father   . Cancer Father     colon  . Hyperlipidemia Sister   . Heart failure Paternal Uncle   . Heart attack Cousin 32  . Heart disease Cousin     heart attack   Allergies  Allergen Reactions  . Ace Inhibitors Cough   Current Outpatient Prescriptions on File Prior to Visit  Medication Sig Dispense Refill  . multivitamin-iron-minerals-folic acid (CENTRUM) chewable tablet Chew 1 tablet by  mouth daily.        Marland Kitchen olmesartan-hydrochlorothiazide (BENICAR HCT) 40-25 MG per tablet Take 1 tablet by mouth daily.  90 tablet  3  . topiramate (TOPAMAX) 100 MG tablet Take 100 mg by mouth as needed.       . [DISCONTINUED] fluticasone (FLONASE) 50 MCG/ACT nasal spray Place 2 sprays into the nose as needed.        . [DISCONTINUED] lisinopril-hydrochlorothiazide (PRINZIDE) 20-12.5 MG per tablet Take 1 tablet by mouth daily.  30 tablet  6  . [DISCONTINUED] torsemide (DEMADEX) 10 MG tablet Take 10 mg by mouth daily.         No current facility-administered medications on file prior to visit.   The PMH, PSH, Social History, Family History, Medications, and allergies have been reviewed in [facility], and have been updated if relevant.   Review of Systems See HPI No HA     Objective:   Physical Exam BP 119/78  Pulse 80  Temp(Src) 98.4 F (36.9 C)  Wt 210 lb (95.255 kg)  BMI 20.92 kg/m2  General:  Well-developed,well-nourished,in no acute distress; alert,appropriate and cooperative throughout examination Head:  normocephalic and atraumatic.   Eyes:  vision grossly intact, pupils  equal, pupils round, and pupils reactive to light.   Ears:  R ear normal and L ear normal.   Nose:  no external deformity.   Mouth:  good dentition.   Lungs:  Normal respiratory effort, chest expands symmetrically. Lungs are clear to auscultation, no crackles or wheezes. Heart:  Normal rate and regular rhythm. S1 and S2 normal without gallop, murmur, click, rub or other extra sounds. Msk:  No deformity or scoliosis noted of thoracic or lumbar spine.   Extremities:  No clubbing, cyanosis, edema, or deformity noted with normal full range of motion of all joints.   Neurologic:  alert & oriented X3 and gait normal.   Skin:  Intact without suspicious lesions or rashes Cervical Nodes:  No lymphadenopathy noted Axillary Nodes:  No palpable lymphadenopathy Psych:  Cognition and judgment appear intact. Alert and cooperative  with normal attention span and concentration. No apparent delusions, illusions, hallucinations     Assessment & Plan:  1. Hypertension Stable on current dose of Benicar HCT.  She will continue to check it at home and call me if it is elevated.   2. Elevated CK Unknown etiology and she does not have labs with her today so I am not sure how abnormal they were.  Recheck labs today. The patient indicates understanding of these issues and agrees with the plan.  - CK - Comprehensive metabolic panel - CBC with Differential

## 2013-02-12 NOTE — Patient Instructions (Addendum)
Good to see you. We will call you with your lab results.  Check your blood pressure at home- call me if you get elevated readings.

## 2013-05-27 ENCOUNTER — Other Ambulatory Visit: Payer: Self-pay | Admitting: Nurse Practitioner

## 2013-05-27 ENCOUNTER — Other Ambulatory Visit: Payer: Self-pay | Admitting: *Deleted

## 2013-05-27 MED ORDER — OLMESARTAN MEDOXOMIL-HCTZ 40-25 MG PO TABS: 1.0000 | ORAL_TABLET | Freq: Every day | ORAL | Status: DC

## 2013-05-27 NOTE — Telephone Encounter (Signed)
Fax Received. Refill Completed. Amanda Fields (R.M.A)  Pt states she has an appointment with [facility] September 2014.

## 2013-09-01 ENCOUNTER — Telehealth: Payer: Self-pay | Admitting: *Deleted

## 2013-09-01 NOTE — Telephone Encounter (Signed)
Per [place] review, Benicar HCT approved for 12 months

## 2013-09-09 ENCOUNTER — Encounter: Payer: Self-pay | Admitting: Family Medicine

## 2013-09-09 ENCOUNTER — Ambulatory Visit (INDEPENDENT_AMBULATORY_CARE_PROVIDER_SITE_OTHER): Payer: Medicaid Other | Admitting: Family Medicine

## 2013-09-09 VITALS — BP 142/101 | HR 79 | Ht 66.75 in | Wt 182.0 lb

## 2013-09-09 DIAGNOSIS — N949 Unspecified condition associated with female genital organs and menstrual cycle: Secondary | ICD-10-CM

## 2013-09-09 DIAGNOSIS — R102 Pelvic and perineal pain: Secondary | ICD-10-CM

## 2013-09-09 MED ORDER — DICLOFENAC SODIUM 75 MG PO TBEC: 75.0000 mg | DELAYED_RELEASE_TABLET | Freq: Two times a day (BID) | ORAL | Status: DC

## 2013-09-09 NOTE — Patient Instructions (Signed)
Endometriosis  Endometriosis is a disease that occurs when the endometrium (lining of the uterus) is misplaced outside of its normal location. It may occur in many locations close to the uterus (womb), but commonly on the ovaries, fallopian tubes, vagina (birth canal) and bowel located close to the uterus. Because the uterus sloughs (expels) its lining every month (menses), there is bleeding whereever the endometrial tissue is located.  SYMPTOMS   Often there are no symptoms. However, because blood is irritating to tissues not normally exposed to it, when symptoms occur they vary with the location of the misplaced endometrium. Symptoms often include back and abdominal pain. Periods may be heavier and intercourse may be painful. Infertility may be present. You may have all of these symptoms at one time or another or you may have months with no symptoms at all. Although the symptoms occur mainly during menses, they can occur mid-cycle as well, and usually terminate with menopause.  DIAGNOSIS   Your caregiver may recommend a blood test and urine test (urinalysis) to help rule out other conditions. Another common test is ultrasound, a painless procedure that uses sound waves to make a sonogram "picture" of abnormal tissue that could be endometriosis. If your bowel movements are painful around your periods, your caregiver may advise a barium enema (an X-ray of the lower bowel), to try to find the source of your pain. This is sometimes confirmed by laparoscopy. Laparoscopy is a procedure where your caregiver looks into your abdomen with a laparoscope (a small pencil sized telescope). Your caregiver may take a tiny piece of tissue (biopsy) from any abnormal tissue to confirm or document your problem. These tissues are sent to the lab and a pathologist looks at them under the microscope to give a microscopic diagnosis.  TREATMENT   Once the diagnosis is made, it can be treated by destruction of the misplaced endometrial  tissue using heat (diathermy), laser, cutting (excision), or chemical means. It may also be treated with hormonal therapy. When using hormonal therapy menses are eliminated, therefore eliminating the monthly exposure to blood by the misplaced endometrial tissue. Only in severe cases is it necessary to perform a hysterectomy with removal of the tubes, uterus and ovaries.  HOME CARE INSTRUCTIONS    Only take over-the-counter or prescription medicines for pain, discomfort, or fever as directed by your caregiver.   Avoid activities that produce pain, including a physical sexual relationship.   Do not take aspirin as this may increase bleeding when not on hormonal therapy.   See your caregiver for pain or problems not controlled with treatment.  SEEK IMMEDIATE MEDICAL CARE IF:    Your pain is severe and is not responding to pain medicine.   You develop severe nausea and vomiting, or you cannot keep foods down.   Your pain localizes to the right lower part of your abdomen (possible appendicitis).   You have swelling or increasing pain in the abdomen.   You have a fever.   You see blood in your stool.  MAKE SURE YOU:    Understand these instructions.   Will watch your condition.   Will get help right away if you are not doing well or get worse.  Document Released: 12/01/2000 Document Revised: 02/26/2012 Document Reviewed: 07/22/2008  [employer] Patient Information 2014 [employer].

## 2013-09-09 NOTE — Assessment & Plan Note (Signed)
Unclear etiology--does not seem to be attributable to ovaries or cervix.  Will check pelvic sono.  ? Related to bladder.  Will check U/A.  Trial of Voltaren, given crampy nature.  Return in 4 wks for results and to see if this has resolved.

## 2013-09-09 NOTE — Progress Notes (Signed)
  Subjective:    Patient ID: Amanda Fields, female    DOB: 1976-06-21, 37 y.o.   MRN: [id number]  Pelvic Pain The patient's primary symptoms include pelvic pain. Missed menses: cramping. This is a new (over last 2-3 wks.) problem. The problem has been unchanged. The pain is moderate (crampy in the midline). She is not pregnant. Pertinent negatives include no abdominal pain, chills, discolored urine, dysuria, fever, hematuria, painful intercourse, rash or urgency. Nothing aggravates the symptoms. She has tried NSAIDs for the symptoms. The treatment provided no relief. She is sexually active. No, her partner does not have an STD. She uses hysterectomy for contraception. Her past medical history is significant for endometriosis. There is no history of an STD.   We have no records to indicate endometriosis.  Pt. Reports diagnosis made at yearly after BTL by GYN MD.  Had endometrial ablation then, [facility].   Review of Systems  Constitutional: Negative for fever and chills.  Respiratory: Negative for shortness of breath.   Gastrointestinal: Negative for abdominal pain.  Genitourinary: Positive for pelvic pain. Negative for dysuria, urgency, hematuria and vaginal bleeding. Missed menses: cramping.  Skin: Negative for rash.       Objective:   Physical Exam  Vitals reviewed. Constitutional: She appears well-developed and well-nourished.  HENT:  Head: Normocephalic and atraumatic.  Cardiovascular: Normal rate.   Pulmonary/Chest: Effort normal.  Abdominal: Soft. There is no tenderness.  Genitourinary: Vagina normal.  There is no CMT, adnexa are without mass or tenderness.  There is no uterus, but cervix appears normal.  There is no tenderness throughout pelvis.  Neurological: She is alert.          Assessment & Plan:

## 2013-09-15 ENCOUNTER — Ambulatory Visit (HOSPITAL_COMMUNITY)
Admission: RE | Admit: 2013-09-15 | Discharge: 2013-09-15 | Disposition: A | Discharge status: Home / Self Care | Payer: Medicaid Other | Source: Ambulatory Visit | Attending: Family Medicine | Admitting: Family Medicine

## 2013-09-15 ENCOUNTER — Other Ambulatory Visit: Payer: Self-pay | Admitting: Family Medicine

## 2013-09-15 DIAGNOSIS — R102 Pelvic and perineal pain: Secondary | ICD-10-CM

## 2013-09-15 DIAGNOSIS — Z90711 Acquired absence of uterus with remaining cervical stump: Secondary | ICD-10-CM | POA: Insufficient documentation

## 2013-09-15 DIAGNOSIS — N949 Unspecified condition associated with female genital organs and menstrual cycle: Secondary | ICD-10-CM | POA: Insufficient documentation

## 2013-11-06 ENCOUNTER — Other Ambulatory Visit: Payer: Self-pay | Admitting: Nurse Practitioner

## 2013-11-28 ENCOUNTER — Encounter: Payer: Self-pay | Admitting: Obstetrics and Gynecology

## 2013-11-28 ENCOUNTER — Other Ambulatory Visit (HOSPITAL_COMMUNITY)
Admission: RE | Admit: 2013-11-28 | Discharge: 2013-11-28 | Disposition: A | Discharge status: Home / Self Care | Payer: Medicaid Other | Source: Ambulatory Visit | Attending: Obstetrics and Gynecology | Admitting: Obstetrics and Gynecology

## 2013-11-28 ENCOUNTER — Ambulatory Visit (INDEPENDENT_AMBULATORY_CARE_PROVIDER_SITE_OTHER): Payer: Medicaid Other | Admitting: Obstetrics and Gynecology

## 2013-11-28 VITALS — BP 143/95 | HR 76 | Ht 66.0 in | Wt 192.6 lb

## 2013-11-28 DIAGNOSIS — Z01419 Encounter for gynecological examination (general) (routine) without abnormal findings: Secondary | ICD-10-CM

## 2013-11-28 NOTE — Patient Instructions (Signed)
Preventive Care for Adults, Female A healthy lifestyle and preventive care can promote health and wellness. Preventive health guidelines for women include the following key practices.  A routine yearly physical is a good way to check with your caregiver about your health and preventive screening. It is a chance to share any concerns and updates on your health, and to receive a thorough exam.  Visit your dentist for a routine exam and preventive care every 6 months. Brush your teeth twice a day and floss once a day. Good oral hygiene prevents tooth decay and gum disease.  The frequency of eye exams is based on your age, health, family medical history, use of contact lenses, and other factors. Follow your caregiver's recommendations for frequency of eye exams.  Eat a healthy diet. Foods like vegetables, fruits, whole grains, low-fat dairy products, and lean protein foods contain the nutrients you need without too many calories. Decrease your intake of foods high in solid fats, added sugars, and salt. Eat the right amount of calories for you.Get information about a proper diet from your caregiver, if necessary.  Regular physical exercise is one of the most important things you can do for your health. Most adults should get at least 150 minutes of moderate-intensity exercise (any activity that increases your heart rate and causes you to sweat) each week. In addition, most adults need muscle-strengthening exercises on 2 or more days a week.  Maintain a healthy weight. The body mass index (BMI) is a screening tool to identify possible weight problems. It provides an estimate of body fat based on height and weight. Your caregiver can help determine your BMI, and can help you achieve or maintain a healthy weight.For adults 20 years and older:  A BMI below 18.5 is considered underweight.  A BMI of 18.5 to 24.9 is normal.  A BMI of 25 to 29.9 is considered overweight.  A BMI of 30 and above is  considered obese.  Maintain normal blood lipids and cholesterol levels by exercising and minimizing your intake of saturated fat. Eat a balanced diet with plenty of fruit and vegetables. Blood tests for lipids and cholesterol should begin at age 20 and be repeated every 5 years. If your lipid or cholesterol levels are high, you are over 50, or you are at high risk for heart disease, you may need your cholesterol levels checked more frequently.Ongoing high lipid and cholesterol levels should be treated with medicines if diet and exercise are not effective.  If you smoke, find out from your caregiver how to quit. If you do not use tobacco, do not start.  Lung cancer screening is recommended for adults aged 55 80 years who are at high risk for developing lung cancer because of a history of smoking. Yearly low-dose computed tomography (CT) is recommended for people who have at least a 30-pack-year history of smoking and are a current smoker or have quit within the past 15 years. A pack year of smoking is smoking an average of 1 pack of cigarettes a day for 1 year (for example: 1 pack a day for 30 years or 2 packs a day for 15 years). Yearly screening should continue until the smoker has stopped smoking for at least 15 years. Yearly screening should also be stopped for people who develop a health problem that would prevent them from having lung cancer treatment.  If you are pregnant, do not drink alcohol. If you are breastfeeding, be very cautious about drinking alcohol. If you are   not pregnant and choose to drink alcohol, do not exceed 1 drink per day. One drink is considered to be 12 ounces (355 mL) of beer, 5 ounces (148 mL) of wine, or 1.5 ounces (44 mL) of liquor.  Avoid use of street drugs. Do not share needles with anyone. Ask for help if you need support or instructions about stopping the use of drugs.  High blood pressure causes heart disease and increases the risk of stroke. Your blood pressure  should be checked at least every 1 to 2 years. Ongoing high blood pressure should be treated with medicines if weight loss and exercise are not effective.  If you are 55 to 39 years old, ask your caregiver if you should take aspirin to prevent strokes.  Diabetes screening involves taking a blood sample to check your fasting blood sugar level. This should be done once every 3 years, after age 45, if you are within normal weight and without risk factors for diabetes. Testing should be considered at a younger age or be carried out more frequently if you are overweight and have at least 1 risk factor for diabetes.  Breast cancer screening is essential preventive care for women. You should practice "breast self-awareness." This means understanding the normal appearance and feel of your breasts and may include breast self-examination. Any changes detected, no matter how small, should be reported to a caregiver. Women in their 20s and 30s should have a clinical breast exam (CBE) by a caregiver as part of a regular health exam every 1 to 3 years. After age 40, women should have a CBE every year. Starting at age 40, women should consider having a mammography (breast X-ray test) every year. Women who have a family history of breast cancer should talk to their caregiver about genetic screening. Women at a high risk of breast cancer should talk to their caregivers about having magnetic resonance imaging (MRI) and a mammography every year.  Breast cancer gene (BRCA)-related cancer risk assessment is recommended for women who have family members with BRCA-related cancers. BRCA-related cancers include breast, ovarian, tubal, and peritoneal cancers. Having family members with these cancers may be associated with an increased risk for harmful changes (mutations) in the breast cancer genes BRCA1 and BRCA2. Results of the assessment will determine the need for genetic counseling and BRCA1 and BRCA2 testing.  The Pap test is  a screening test for cervical cancer. A Pap test can show cell changes on the cervix that might become cervical cancer if left untreated. A Pap test is a procedure in which cells are obtained and examined from the lower end of the uterus (cervix).  Women should have a Pap test starting at age 21.  Between ages 21 and 29, Pap tests should be repeated every 2 years.  Beginning at age 30, you should have a Pap test every 3 years as long as the past 3 Pap tests have been normal.  Some women have medical problems that increase the chance of getting cervical cancer. Talk to your caregiver about these problems. It is especially important to talk to your caregiver if a new problem develops soon after your last Pap test. In these cases, your caregiver may recommend more frequent screening and Pap tests.  The above recommendations are the same for women who have or have not gotten the vaccine for human papillomavirus (HPV).  If you had a hysterectomy for a problem that was not cancer or a condition that could lead to cancer, then   you no longer need Pap tests. Even if you no longer need a Pap test, a regular exam is a good idea to make sure no other problems are starting.  If you are between ages 65 and 70, and you have had normal Pap tests going back 10 years, you no longer need Pap tests. Even if you no longer need a Pap test, a regular exam is a good idea to make sure no other problems are starting.  If you have had past treatment for cervical cancer or a condition that could lead to cancer, you need Pap tests and screening for cancer for at least 20 years after your treatment.  If Pap tests have been discontinued, risk factors (such as a new sexual partner) need to be reassessed to determine if screening should be resumed.  The HPV test is an additional test that may be used for cervical cancer screening. The HPV test looks for the virus that can cause the cell changes on the cervix. The cells collected  during the Pap test can be tested for HPV. The HPV test could be used to screen women aged 30 years and older, and should be used in women of any age who have unclear Pap test results. After the age of 30, women should have HPV testing at the same frequency as a Pap test.  Colorectal cancer can be detected and often prevented. Most routine colorectal cancer screening begins at the age of 50 and continues through age 75. However, your caregiver may recommend screening at an earlier age if you have risk factors for colon cancer. On a yearly basis, your caregiver may provide home test kits to check for hidden blood in the stool. Use of a small camera at the end of a tube, to directly examine the colon (sigmoidoscopy or colonoscopy), can detect the earliest forms of colorectal cancer. Talk to your caregiver about this at age 50, when routine screening begins. Direct examination of the colon should be repeated every 5 to 10 years through age 75, unless early forms of pre-cancerous polyps or small growths are found.  Hepatitis C blood testing is recommended for all people born from 1945 through 1965 and any individual with known risks for hepatitis C.  Practice safe sex. Use condoms and avoid high-risk sexual practices to reduce the spread of sexually transmitted infections (STIs). STIs include gonorrhea, chlamydia, syphilis, trichomonas, herpes, HPV, and human immunodeficiency virus (HIV). Herpes, HIV, and HPV are viral illnesses that have no cure. They can result in disability, cancer, and death. Sexually active women aged 25 and younger should be checked for chlamydia. Older women with new or multiple partners should also be tested for chlamydia. Testing for other STIs is recommended if you are sexually active and at increased risk.  Osteoporosis is a disease in which the bones lose minerals and strength with aging. This can result in serious bone fractures. The risk of osteoporosis can be identified using a  bone density scan. Women ages 65 and over and women at risk for fractures or osteoporosis should discuss screening with their caregivers. Ask your caregiver whether you should take a calcium supplement or vitamin D to reduce the rate of osteoporosis.  Menopause can be associated with physical symptoms and risks. Hormone replacement therapy is available to decrease symptoms and risks. You should talk to your caregiver about whether hormone replacement therapy is right for you.  Use sunscreen. Apply sunscreen liberally and repeatedly throughout the day. You should seek shade   when your shadow is shorter than you. Protect yourself by wearing long sleeves, pants, a wide-brimmed hat, and sunglasses year round, whenever you are outdoors.  Once a month, do a whole body skin exam, using a mirror to look at the skin on your back. Notify your caregiver of new moles, moles that have irregular borders, moles that are larger than a pencil eraser, or moles that have changed in shape or color.  Stay current with required immunizations.  Influenza vaccine. All adults should be immunized every year.  Tetanus, diphtheria, and acellular pertussis (Td, Tdap) vaccine. Pregnant women should receive 1 dose of Tdap vaccine during each pregnancy. The dose should be obtained regardless of the length of time since the last dose. Immunization is preferred during the 27th to 36th week of gestation. An adult who has not previously received Tdap or who does not know her vaccine status should receive 1 dose of Tdap. This initial dose should be followed by tetanus and diphtheria toxoids (Td) booster doses every 10 years. Adults with an unknown or incomplete history of completing a 3-dose immunization series with Td-containing vaccines should begin or complete a primary immunization series including a Tdap dose. Adults should receive a Td booster every 10 years.  Varicella vaccine. An adult without evidence of immunity to varicella  should receive 2 doses or a second dose if she has previously received 1 dose. Pregnant females who do not have evidence of immunity should receive the first dose after pregnancy. This first dose should be obtained before leaving the health care facility. The second dose should be obtained 4 8 weeks after the first dose.  Human papillomavirus (HPV) vaccine. Females aged 13 26 years who have not received the vaccine previously should obtain the 3-dose series. The vaccine is not recommended for use in pregnant females. However, pregnancy testing is not needed before receiving a dose. If a female is found to be pregnant after receiving a dose, no treatment is needed. In that case, the remaining doses should be delayed until after the pregnancy. Immunization is recommended for any person with an immunocompromised condition through the age of 26 years if she did not get any or all doses earlier. During the 3-dose series, the second dose should be obtained 4 8 weeks after the first dose. The third dose should be obtained 24 weeks after the first dose and 16 weeks after the second dose.  Zoster vaccine. One dose is recommended for adults aged 60 years or older unless certain conditions are present.  Measles, mumps, and rubella (MMR) vaccine. Adults born before 1957 generally are considered immune to measles and mumps. Adults born in 1957 or later should have 1 or more doses of MMR vaccine unless there is a contraindication to the vaccine or there is laboratory evidence of immunity to each of the three diseases. A routine second dose of MMR vaccine should be obtained at least 28 days after the first dose for students attending postsecondary schools, health care workers, or international travelers. People who received inactivated measles vaccine or an unknown type of measles vaccine during 1963 1967 should receive 2 doses of MMR vaccine. People who received inactivated mumps vaccine or an unknown type of mumps vaccine  before 1979 and are at high risk for mumps infection should consider immunization with 2 doses of MMR vaccine. For females of childbearing age, rubella immunity should be determined. If there is no evidence of immunity, females who are not pregnant should be vaccinated. If there   is no evidence of immunity, females who are pregnant should delay immunization until after pregnancy. Unvaccinated health care workers born before 1957 who lack laboratory evidence of measles, mumps, or rubella immunity or laboratory confirmation of disease should consider measles and mumps immunization with 2 doses of MMR vaccine or rubella immunization with 1 dose of MMR vaccine.  Pneumococcal 13-valent conjugate (PCV13) vaccine. When indicated, a person who is uncertain of her immunization history and has no record of immunization should receive the PCV13 vaccine. An adult aged 19 years or older who has certain medical conditions and has not been previously immunized should receive 1 dose of PCV13 vaccine. This PCV13 should be followed with a dose of pneumococcal polysaccharide (PPSV23) vaccine. The PPSV23 vaccine dose should be obtained at least 8 weeks after the dose of PCV13 vaccine. An adult aged 19 years or older who has certain medical conditions and previously received 1 or more doses of PPSV23 vaccine should receive 1 dose of PCV13. The PCV13 vaccine dose should be obtained 1 or more years after the last PPSV23 vaccine dose.  Pneumococcal polysaccharide (PPSV23) vaccine. When PCV13 is also indicated, PCV13 should be obtained first. All adults aged 65 years and older should be immunized. An adult younger than age 65 years who has certain medical conditions should be immunized. Any person who resides in a nursing home or long-term care facility should be immunized. An adult smoker should be immunized. People with an immunocompromised condition and certain other conditions should receive both PCV13 and PPSV23 vaccines. People  with human immunodeficiency virus (HIV) infection should be immunized as soon as possible after diagnosis. Immunization during chemotherapy or radiation therapy should be avoided. Routine use of PPSV23 vaccine is not recommended for American Indians, [place] Natives, or people younger than 65 years unless there are medical conditions that require PPSV23 vaccine. When indicated, people who have unknown immunization and have no record of immunization should receive PPSV23 vaccine. One-time revaccination 5 years after the first dose of PPSV23 is recommended for people aged 19 64 years who have chronic kidney failure, nephrotic syndrome, asplenia, or immunocompromised conditions. People who received 1 2 doses of PPSV23 before age 65 years should receive another dose of PPSV23 vaccine at age 65 years or later if at least 5 years have passed since the previous dose. Doses of PPSV23 are not needed for people immunized with PPSV23 at or after age 65 years.  Meningococcal vaccine. Adults with asplenia or persistent complement component deficiencies should receive 2 doses of quadrivalent meningococcal conjugate (MenACWY-D) vaccine. The doses should be obtained at least 2 months apart. Microbiologists working with certain meningococcal bacteria, military recruits, people at risk during an outbreak, and people who travel to or live in countries with a high rate of meningitis should be immunized. A first-year college student up through age 21 years who is living in a residence hall should receive a dose if she did not receive a dose on or after her 16th birthday. Adults who have certain high-risk conditions should receive one or more doses of vaccine.  Hepatitis A vaccine. Adults who wish to be protected from this disease, have certain high-risk conditions, work with hepatitis A-infected animals, work in hepatitis A research labs, or travel to or work in countries with a high rate of hepatitis A should be immunized. Adults  who were previously unvaccinated and who anticipate close contact with an international adoptee during the first 60 days after arrival in the [place] from a country   with a high rate of hepatitis A should be immunized.  Hepatitis B vaccine. Adults who wish to be protected from this disease, have certain high-risk conditions, may be exposed to blood or other infectious body fluids, are household contacts or sex partners of hepatitis B positive people, are clients or workers in certain care facilities, or travel to or work in countries with a high rate of hepatitis B should be immunized.  Haemophilus influenzae type b (Hib) vaccine. A previously unvaccinated person with asplenia or sickle cell disease or having a scheduled splenectomy should receive 1 dose of Hib vaccine. Regardless of previous immunization, a recipient of a hematopoietic stem cell transplant should receive a 3-dose series 6 12 months after her successful transplant. Hib vaccine is not recommended for adults with HIV infection. Preventive Services / Frequency Ages 19 to 39  Blood pressure check.** / Every 1 to 2 years.  Lipid and cholesterol check.** / Every 5 years beginning at age 20.  Clinical breast exam.** / Every 3 years for women in their 20s and 30s.  BRCA-related cancer risk assessment.** / For women who have family members with a BRCA-related cancer (breast, ovarian, tubal, or peritoneal cancers).  Pap test.** / Every 2 years from ages 21 through 29. Every 3 years starting at age 30 through age 65 or 70 with a history of 3 consecutive normal Pap tests.  HPV screening.** / Every 3 years from ages 30 through ages 65 to 70 with a history of 3 consecutive normal Pap tests.  Hepatitis C blood test.** / For any individual with known risks for hepatitis C.  Skin self-exam. / Monthly.  Influenza vaccine. / Every year.  Tetanus, diphtheria, and acellular pertussis (Tdap, Td) vaccine.** / Consult your caregiver. Pregnant  women should receive 1 dose of Tdap vaccine during each pregnancy. 1 dose of Td every 10 years.  Varicella vaccine.** / Consult your caregiver. Pregnant females who do not have evidence of immunity should receive the first dose after pregnancy.  HPV vaccine. / 3 doses over 6 months, if 26 and younger. The vaccine is not recommended for use in pregnant females. However, pregnancy testing is not needed before receiving a dose.  Measles, mumps, rubella (MMR) vaccine.** / You need at least 1 dose of MMR if you were born in 1957 or later. You may also need a 2nd dose. For females of childbearing age, rubella immunity should be determined. If there is no evidence of immunity, females who are not pregnant should be vaccinated. If there is no evidence of immunity, females who are pregnant should delay immunization until after pregnancy.  Pneumococcal 13-valent conjugate (PCV13) vaccine.** / Consult your caregiver.  Pneumococcal polysaccharide (PPSV23) vaccine.** / 1 to 2 doses if you smoke cigarettes or if you have certain conditions.  Meningococcal vaccine.** / 1 dose if you are age 19 to 21 years and a first-year college student living in a residence hall, or have one of several medical conditions, you need to get vaccinated against meningococcal disease. You may also need additional booster doses.  Hepatitis A vaccine.** / Consult your caregiver.  Hepatitis B vaccine.** / Consult your caregiver.  Haemophilus influenzae type b (Hib) vaccine.** / Consult your caregiver. Ages 40 to 64  Blood pressure check.** / Every 1 to 2 years.  Lipid and cholesterol check.** / Every 5 years beginning at age 20.  Lung cancer screening. / Every year if you are aged 55 80 years and have a 30-pack-year history of smoking and   currently smoke or have quit within the past 15 years. Yearly screening is stopped once you have quit smoking for at least 15 years or develop a health problem that would prevent you from having  lung cancer treatment.  Clinical breast exam.** / Every year after age 40.  BRCA-related cancer risk assessment.** / For women who have family members with a BRCA-related cancer (breast, ovarian, tubal, or peritoneal cancers).  Mammogram.** / Every year beginning at age 40 and continuing for as long as you are in good health. Consult with your caregiver.  Pap test.** / Every 3 years starting at age 30 through age 65 or 70 with a history of 3 consecutive normal Pap tests.  HPV screening.** / Every 3 years from ages 30 through ages 65 to 70 with a history of 3 consecutive normal Pap tests.  Fecal occult blood test (FOBT) of stool. / Every year beginning at age 50 and continuing until age 75. You may not need to do this test if you get a colonoscopy every 10 years.  Flexible sigmoidoscopy or colonoscopy.** / Every 5 years for a flexible sigmoidoscopy or every 10 years for a colonoscopy beginning at age 50 and continuing until age 75.  Hepatitis C blood test.** / For all people born from 1945 through 1965 and any individual with known risks for hepatitis C.  Skin self-exam. / Monthly.  Influenza vaccine. / Every year.  Tetanus, diphtheria, and acellular pertussis (Tdap/Td) vaccine.** / Consult your caregiver. Pregnant women should receive 1 dose of Tdap vaccine during each pregnancy. 1 dose of Td every 10 years.  Varicella vaccine.** / Consult your caregiver. Pregnant females who do not have evidence of immunity should receive the first dose after pregnancy.  Zoster vaccine.** / 1 dose for adults aged 60 years or older.  Measles, mumps, rubella (MMR) vaccine.** / You need at least 1 dose of MMR if you were born in 1957 or later. You may also need a 2nd dose. For females of childbearing age, rubella immunity should be determined. If there is no evidence of immunity, females who are not pregnant should be vaccinated. If there is no evidence of immunity, females who are pregnant should delay  immunization until after pregnancy.  Pneumococcal 13-valent conjugate (PCV13) vaccine.** / Consult your caregiver.  Pneumococcal polysaccharide (PPSV23) vaccine.** / 1 to 2 doses if you smoke cigarettes or if you have certain conditions.  Meningococcal vaccine.** / Consult your caregiver.  Hepatitis A vaccine.** / Consult your caregiver.  Hepatitis B vaccine.** / Consult your caregiver.  Haemophilus influenzae type b (Hib) vaccine.** / Consult your caregiver. Ages 65 and over  Blood pressure check.** / Every 1 to 2 years.  Lipid and cholesterol check.** / Every 5 years beginning at age 20.  Lung cancer screening. / Every year if you are aged 55 80 years and have a 30-pack-year history of smoking and currently smoke or have quit within the past 15 years. Yearly screening is stopped once you have quit smoking for at least 15 years or develop a health problem that would prevent you from having lung cancer treatment.  Clinical breast exam.** / Every year after age 40.  BRCA-related cancer risk assessment.** / For women who have family members with a BRCA-related cancer (breast, ovarian, tubal, or peritoneal cancers).  Mammogram.** / Every year beginning at age 40 and continuing for as long as you are in good health. Consult with your caregiver.  Pap test.** / Every 3 years starting at age   30 through age 65 or 70 with a 3 consecutive normal Pap tests. Testing can be stopped between 65 and 70 with 3 consecutive normal Pap tests and no abnormal Pap or HPV tests in the past 10 years.  HPV screening.** / Every 3 years from ages 30 through ages 65 or 70 with a history of 3 consecutive normal Pap tests. Testing can be stopped between 65 and 70 with 3 consecutive normal Pap tests and no abnormal Pap or HPV tests in the past 10 years.  Fecal occult blood test (FOBT) of stool. / Every year beginning at age 50 and continuing until age 75. You may not need to do this test if you get a colonoscopy  every 10 years.  Flexible sigmoidoscopy or colonoscopy.** / Every 5 years for a flexible sigmoidoscopy or every 10 years for a colonoscopy beginning at age 50 and continuing until age 75.  Hepatitis C blood test.** / For all people born from 1945 through 1965 and any individual with known risks for hepatitis C.  Osteoporosis screening.** / A one-time screening for women ages 65 and over and women at risk for fractures or osteoporosis.  Skin self-exam. / Monthly.  Influenza vaccine. / Every year.  Tetanus, diphtheria, and acellular pertussis (Tdap/Td) vaccine.** / 1 dose of Td every 10 years.  Varicella vaccine.** / Consult your caregiver.  Zoster vaccine.** / 1 dose for adults aged 60 years or older.  Pneumococcal 13-valent conjugate (PCV13) vaccine.** / Consult your caregiver.  Pneumococcal polysaccharide (PPSV23) vaccine.** / 1 dose for all adults aged 65 years and older.  Meningococcal vaccine.** / Consult your caregiver.  Hepatitis A vaccine.** / Consult your caregiver.  Hepatitis B vaccine.** / Consult your caregiver.  Haemophilus influenzae type b (Hib) vaccine.** / Consult your caregiver. ** Family history and personal history of risk and conditions may change your caregiver's recommendations. Document Released: 01/30/2002 Document Revised: 03/31/2013 Document Reviewed: 05/01/2011 ExitCare Patient Information 2014 [employer].  

## 2013-11-28 NOTE — Progress Notes (Addendum)
  Subjective:     Amanda Fields is a 37 y.o. female G2P2 BMI 31 s/p hysterectomy in 2011 for pelvic pain who is here for a comprehensive physical exam. The patient reports no problems.  History   Social History  . Marital Status: Single    Spouse Name: N/A    Number of Children: 2  . Years of Education: N/A   Occupational History  . call center    Social History Main Topics  . Smoking status: Never Smoker   . Smokeless tobacco: Never Used  . Alcohol Use: No  . Drug Use: No  . Sexual Activity: Not Currently   Other Topics Concern  . Not on file   Social History Narrative  . No narrative on file   Health Maintenance  Topic Date Due  . Influenza Vaccine  07/18/2013  . Pap Smear  08/28/2015  . Tetanus/tdap  01/03/2022   Past Medical History  Diagnosis Date  . Hypertension     echo 03/2012 with moderate LVH, but normal EF and no evidence of diastolic dysfunction  . Edema leg   . Dizziness   . Migraine headache   . Asthma   . Palpitation   . Endometriosis   . 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 Obesity   . Cough due to ACE inhibitor    Past Surgical History  Procedure Laterality Date  . Tonsillectomy    . Tubal ligation    . Wisdom tooth extraction    . Laparoscopic supracervical hysterectomy  09/27/2010    For endometriosis. Ovaries anf tubes are in place.   Family History  Problem Relation Age of Onset  . COPD Mother   . Hypotension Mother   . Colon cancer Father   . Emphysema Father   . Cancer Father     colon  . Hyperlipidemia Sister   . Heart failure Paternal Uncle   . Heart attack Cousin 32  . Heart disease Cousin     heart attack   History  Substance Use Topics  . Smoking status: Never Smoker   . Smokeless tobacco: Never Used  . Alcohol Use: No       Review of Systems A comprehensive review of systems was negative.   Objective:      GENERAL: Well-developed, well-nourished female in no acute distress.  HEENT: Normocephalic, atraumatic.  Sclerae anicteric.  NECK: Supple. Normal thyroid.  LUNGS: Clear to auscultation bilaterally.  HEART: Regular rate and rhythm. BREASTS: Symmetric in size. No palpable masses or lymphadenopathy, skin changes, or nipple drainage. ABDOMEN: Soft, nontender, nondistended. No organomegaly. PELVIC: Normal external female genitalia. Vagina is pink and rugated.  Normal discharge. Normal appearing cervix. No adnexal mass or tenderness. EXTREMITIES: No cyanosis, clubbing, or edema, 2+ distal pulses.    Assessment:    Healthy female exam.      Plan:    Pap smear collected Patient advised to perform self breast and vulva exam GI referral for colonoscopy as patient father was diagnosed with colon cancer in his 42's See After Visit Summary for Counseling Recommendations

## 2014-01-15 ENCOUNTER — Telehealth: Payer: Self-pay | Admitting: *Deleted

## 2014-01-15 DIAGNOSIS — N760 Acute vaginitis: Principal | ICD-10-CM

## 2014-01-15 DIAGNOSIS — B9689 Other specified bacterial agents as the cause of diseases classified elsewhere: Secondary | ICD-10-CM

## 2014-01-15 MED ORDER — METRONIDAZOLE 500 MG PO TABS: 500.0000 mg | ORAL_TABLET | Freq: Two times a day (BID) | ORAL | Status: DC

## 2014-01-15 NOTE — Telephone Encounter (Signed)
Pt called and had anal sex for the first time and she has developed bacterial vaginosis.  She has a strong odor and discharge.  I have sent in Metronidazole to her pharmacy.  Pt aware.

## 2014-01-18 ENCOUNTER — Other Ambulatory Visit: Payer: Self-pay | Admitting: Cardiology

## 2014-03-10 ENCOUNTER — Encounter: Payer: Self-pay | Admitting: Obstetrics & Gynecology

## 2014-03-10 ENCOUNTER — Ambulatory Visit (INDEPENDENT_AMBULATORY_CARE_PROVIDER_SITE_OTHER): Payer: Medicaid Other | Admitting: Obstetrics & Gynecology

## 2014-03-10 VITALS — BP 139/101 | HR 81 | Ht 66.75 in | Wt 196.6 lb

## 2014-03-10 DIAGNOSIS — R232 Flushing: Secondary | ICD-10-CM

## 2014-03-10 DIAGNOSIS — N898 Other specified noninflammatory disorders of vagina: Secondary | ICD-10-CM

## 2014-03-10 DIAGNOSIS — Z113 Encounter for screening for infections with a predominantly sexual mode of transmission: Secondary | ICD-10-CM

## 2014-03-10 DIAGNOSIS — Z0000 Encounter for general adult medical examination without abnormal findings: Secondary | ICD-10-CM

## 2014-03-10 DIAGNOSIS — N951 Menopausal and female climacteric states: Secondary | ICD-10-CM

## 2014-03-10 MED ORDER — FLUCONAZOLE 150 MG PO TABS: 150.0000 mg | ORAL_TABLET | Freq: Once | ORAL | Status: DC

## 2014-03-10 NOTE — Progress Notes (Signed)
Patient feels like she might be going through menopause.  Not sleeping at night, hot flashes, night sweats, decreased sexual desire.  She is also having increased cottage cheese like discharge that is sometimes irritating but not always.  This has been going for a month but the other symptoms have been coming since December.

## 2014-03-10 NOTE — Progress Notes (Signed)
   Subjective:    Patient ID: Amanda Fields, female    DOB: 1976-08-06, 38 y.o.   MRN: [id number]  HPI  38 yo P2 (21 and 79 yo kids) here today because of the onset of hot flashes, night sweats, mood swings for the last 4 months. Her mother and sister started with these symptoms at 40 yo. They both had early menopause. She bought Research scientist (medical) but didn't take it.  She also complains of a vaginal discharge, curdy and smells like vinegar. No sex for 6 months. Broke up with BF.   Review of Systems     Objective:   Physical Exam  curdy discharge Negative bimanual exam      Assessment & Plan:  Routine testing- STIs Menopausal symptoms- Check FSH per her request I discussed the various treatment options. She will let me know what she wants.

## 2014-03-11 LAB — WET PREP, GENITAL: TRICH WET PREP: NONE SEEN

## 2014-03-11 LAB — RPR

## 2014-03-11 LAB — GC/CHLAMYDIA PROBE AMP
CT Probe RNA: NEGATIVE
GC PROBE AMP APTIMA: NEGATIVE

## 2014-03-11 LAB — HEPATITIS C ANTIBODY: HCV AB: NEGATIVE

## 2014-03-11 LAB — FOLLICLE STIMULATING HORMONE: FSH: 13.2 m[IU]/mL

## 2014-03-11 LAB — HIV ANTIBODY (ROUTINE TESTING W REFLEX): HIV: NONREACTIVE

## 2014-03-11 LAB — HEPATITIS B SURFACE ANTIGEN: HEP B S AG: NEGATIVE

## 2014-03-24 ENCOUNTER — Ambulatory Visit (HOSPITAL_COMMUNITY)
Admission: RE | Admit: 2014-03-24 | Discharge: 2014-03-24 | Disposition: A | Discharge status: Home / Self Care | Payer: Medicaid Other | Source: Ambulatory Visit | Attending: Obstetrics & Gynecology | Admitting: Obstetrics & Gynecology

## 2014-03-24 DIAGNOSIS — Z0000 Encounter for general adult medical examination without abnormal findings: Secondary | ICD-10-CM

## 2014-03-24 DIAGNOSIS — Z1231 Encounter for screening mammogram for malignant neoplasm of breast: Secondary | ICD-10-CM | POA: Insufficient documentation

## 2014-03-31 ENCOUNTER — Encounter: Payer: Self-pay | Admitting: Obstetrics & Gynecology

## 2014-03-31 ENCOUNTER — Ambulatory Visit (INDEPENDENT_AMBULATORY_CARE_PROVIDER_SITE_OTHER): Payer: Medicaid Other | Admitting: Obstetrics & Gynecology

## 2014-03-31 VITALS — BP 143/98 | HR 74 | Ht 65.75 in | Wt 199.1 lb

## 2014-03-31 DIAGNOSIS — N951 Menopausal and female climacteric states: Secondary | ICD-10-CM

## 2014-03-31 DIAGNOSIS — R232 Flushing: Secondary | ICD-10-CM

## 2014-03-31 MED ORDER — ESCITALOPRAM OXALATE 20 MG PO TABS: 20.0000 mg | ORAL_TABLET | Freq: Every day | ORAL | Status: DC

## 2014-03-31 NOTE — Progress Notes (Signed)
   Subjective:    Patient ID: Amanda Fields, female    DOB: [birth year], 38 y.o.   MRN: [id number]  HPI  She is here to discuss her Tripp and hot flashes.  Review of Systems     Objective:   Physical Exam        Assessment & Plan:  Hot flashes- I offered OCPs, estrogen, and/or  SSRI. She opts to try the SSRI at this point. Since she has some difficulty sleeping, I will try lexapro 10 mg qhs. RTC 4 weeks.

## 2014-03-31 NOTE — Progress Notes (Signed)
Here today to go over lab results.  Suffering from seasonal allergies.

## 2014-05-07 ENCOUNTER — Ambulatory Visit (INDEPENDENT_AMBULATORY_CARE_PROVIDER_SITE_OTHER): Payer: Medicaid Other | Admitting: Obstetrics & Gynecology

## 2014-05-07 ENCOUNTER — Encounter: Payer: Self-pay | Admitting: Obstetrics & Gynecology

## 2014-05-07 VITALS — BP 122/97 | HR 83 | Ht 66.0 in | Wt 196.0 lb

## 2014-05-07 DIAGNOSIS — G4700 Insomnia, unspecified: Secondary | ICD-10-CM

## 2014-05-07 DIAGNOSIS — R232 Flushing: Secondary | ICD-10-CM

## 2014-05-07 DIAGNOSIS — N951 Menopausal and female climacteric states: Secondary | ICD-10-CM

## 2014-05-07 MED ORDER — ESCITALOPRAM OXALATE 20 MG PO TABS: 20.0000 mg | ORAL_TABLET | Freq: Every day | ORAL | Status: DC

## 2014-05-07 NOTE — Progress Notes (Signed)
   Subjective:    Patient ID: Amanda Fields, female    DOB: 1976/03/13, 38 y.o.   MRN: [id number]  HPI  38 yo lady who is here today for f/u after starting Lexapro 10 mg daily for treatment of hot flashes and insomnia. She reports that all of her symptoms are decreased but no perfect yet. She is willing to try an increased dose of 20 mg. She is having zero side effects and the sleeping is a little bit better.  Review of Systems     Objective:   Physical Exam        Assessment & Plan:  Increase lexapro to 20 mg If this is treating her symptoms, then RTC 1 year/prn sooner

## 2014-05-20 ENCOUNTER — Encounter: Payer: Self-pay | Admitting: Cardiology

## 2014-05-21 ENCOUNTER — Encounter: Payer: Self-pay | Admitting: Cardiology

## 2014-05-21 ENCOUNTER — Encounter (INDEPENDENT_AMBULATORY_CARE_PROVIDER_SITE_OTHER): Payer: Self-pay

## 2014-05-21 ENCOUNTER — Ambulatory Visit (INDEPENDENT_AMBULATORY_CARE_PROVIDER_SITE_OTHER): Payer: Medicaid Other | Admitting: Cardiology

## 2014-05-21 VITALS — BP 110/60 | HR 86 | Resp 16 | Ht 66.0 in | Wt 196.0 lb

## 2014-05-21 DIAGNOSIS — R600 Localized edema: Secondary | ICD-10-CM

## 2014-05-21 DIAGNOSIS — R609 Edema, unspecified: Secondary | ICD-10-CM

## 2014-05-21 DIAGNOSIS — I10 Essential (primary) hypertension: Secondary | ICD-10-CM

## 2014-05-21 MED ORDER — OLMESARTAN MEDOXOMIL-HCTZ 40-25 MG PO TABS: 1.0000 | ORAL_TABLET | Freq: Every day | ORAL | Status: DC

## 2014-05-21 NOTE — Progress Notes (Signed)
Amanda Fields Date of Birth: 1976/06/14   History of Present Illness: Amanda Fields is seen for followup today. She has a history of HTN.  She did have an echocardiogram which showed moderate LVH with normal systolic function. There was no evidence of right ventricular dysfunction. She was last seen in 9/13 for evaluation of swelling. Conservative therapy with support hose and sodium restriction recommended. On follow up today she is doing well. Edema has resolved. She has a much lower stress job now.   Current Outpatient Prescriptions on File Prior to Visit  Medication Sig Dispense Refill  . BENICAR HCT 40-25 MG per tablet TAKE 1 TABLET EVERY DAY **NEEDS OFFICE VISIT**  90 tablet  0  . escitalopram (LEXAPRO) 20 MG tablet Take 1 tablet (20 mg total) by mouth daily.  31 tablet  12  . multivitamin-iron-minerals-folic acid (CENTRUM) chewable tablet Chew 1 tablet by mouth daily.        Marland Kitchen topiramate (TOPAMAX) 100 MG tablet Take 100 mg by mouth as needed.       . baclofen (LIORESAL) 10 MG tablet Take 10 mg by mouth 2 (two) times daily.      . [DISCONTINUED] fluticasone (FLONASE) 50 MCG/ACT nasal spray Place 2 sprays into the nose as needed.        . [DISCONTINUED] lisinopril-hydrochlorothiazide (PRINZIDE) 20-12.5 MG per tablet Take 1 tablet by mouth daily.  30 tablet  6  . [DISCONTINUED] torsemide (DEMADEX) 10 MG tablet Take 10 mg by mouth daily.         No current facility-administered medications on file prior to visit.    Allergies  Allergen Reactions  . Ace Inhibitors Cough    Past Medical History  Diagnosis Date  . Hypertension     echo 03/2012 with moderate LVH, but normal EF and no evidence of diastolic dysfunction  . Edema leg   . Dizziness   . Migraine headache   . Asthma   . Palpitation   . Endometriosis   . 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 Obesity   . Cough due to ACE inhibitor     Past Surgical History  Procedure Laterality Date  . Tonsillectomy    . Tubal ligation    .  Wisdom tooth extraction    . Laparoscopic supracervical hysterectomy  09/27/2010    For endometriosis. Ovaries anf tubes are in place.    History  Smoking status  . Never Smoker   Smokeless tobacco  . Never Used    History  Alcohol Use No    Family History  Problem Relation Age of Onset  . COPD Mother   . Hypotension Mother   . Colon cancer Father   . Emphysema Father   . Cancer Father     colon  . Hyperlipidemia Sister   . Heart failure Paternal Uncle   . Heart attack Cousin 32  . Heart disease Cousin     heart attack    Review of Systems: As noted in HPI. All other systems were reviewed and are negative.  Physical Exam: BP 110/60  Pulse 86  Resp 16  Ht 5' 6"  (1.676 m)  Wt 196 lb (88.905 kg)  BMI 31.65 kg/m2 She is a pleasant black female in no acute distress. HEENT is normal. Neck is supple without JVD, adenopathy, thyromegaly, or bruits. Lungs are clear. Cardiac exam reveals a regular rate and rhythm. Normal S1-S2. No gallop, murmur, or click. Abdomen is soft and nontender without masses or bruits. Femoral and  pedal pulses are 2+ and symmetric. She has trace edema. Skin is warm and dry. She has tattoos on her lower extremities. She is alert oriented x3. Cranial nerves II through XII are intact.  LABORATORY DATA: Ecg: NSR with nonspecific TWA  Assessment / Plan: 1. Edema. Resolved.  2. Hypertension, blood pressure is well controlled. Renewed Benicar HCT.   Disposition, patient will followup with her primary care. I will see her back as needed.

## 2014-05-21 NOTE — Patient Instructions (Signed)
Continue your current therapy  I will see you as needed.

## 2014-07-08 ENCOUNTER — Encounter: Payer: Self-pay | Admitting: Obstetrics & Gynecology

## 2014-07-08 ENCOUNTER — Ambulatory Visit (INDEPENDENT_AMBULATORY_CARE_PROVIDER_SITE_OTHER): Payer: Medicaid Other | Admitting: Obstetrics & Gynecology

## 2014-07-08 VITALS — BP 136/98 | HR 72 | Ht 66.75 in | Wt 201.2 lb

## 2014-07-08 DIAGNOSIS — N898 Other specified noninflammatory disorders of vagina: Secondary | ICD-10-CM

## 2014-07-08 MED ORDER — FLUCONAZOLE 150 MG PO TABS: 150.0000 mg | ORAL_TABLET | Freq: Once | ORAL | Status: DC

## 2014-07-08 NOTE — Progress Notes (Signed)
Vaginal irritation for two weeks following using a new soap.

## 2014-07-08 NOTE — Progress Notes (Signed)
   Subjective:    Patient ID: Amanda Fields, female    DOB: Nov 20, 1976, 38 y.o.   MRN: [id number]  [id number] No LMP recorded. Patient has had a hysterectomy. Several days with vaginal discharge and irritation, no bleeding, odor. [facility] done in [place] in 2011.all Scheduled Meds: Continuous Infusions: PRN Meds:.   Past Surgical History  Procedure Laterality Date  . Tonsillectomy    . Tubal ligation    . Wisdom tooth extraction    . Laparoscopic supracervical hysterectomy  09/27/2010    For endometriosis. Ovaries anf tubes are in place.   Past Medical History  Diagnosis Date  . Hypertension     echo 03/2012 with moderate LVH, but normal EF and no evidence of diastolic dysfunction  . Edema leg   . Dizziness   . Migraine headache   . Asthma   . Palpitation   . Endometriosis   . 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 Obesity   . Cough due to ACE inhibitor        Review of Systems  Constitutional: Negative.   Genitourinary: Positive for vaginal discharge. Negative for dysuria, frequency and vaginal pain.       Objective:   Physical Exam  Constitutional: She is oriented to person, place, and time. She appears well-developed. No distress.  Abdominal: Soft. There is no tenderness.  Genitourinary: Vaginal discharge (white, not abundant. Wet prep sent) found.  Neurological: She is alert and oriented to person, place, and time.  Skin: Skin is warm and dry.  Psychiatric: She has a normal mood and affect. Her behavior is normal.          Assessment & Plan:  Suspected vaginal yeast. Diflucan 150 mg po single dose.   Woodroe Mode, MD 07/08/2014 9:47 AM

## 2014-07-08 NOTE — Patient Instructions (Signed)

## 2014-07-29 ENCOUNTER — Other Ambulatory Visit: Payer: Self-pay | Admitting: Cardiology

## 2014-10-19 ENCOUNTER — Encounter: Payer: Self-pay | Admitting: Obstetrics & Gynecology

## 2014-10-28 ENCOUNTER — Ambulatory Visit (INDEPENDENT_AMBULATORY_CARE_PROVIDER_SITE_OTHER): Payer: Medicaid Other | Admitting: Advanced Practice Midwife

## 2014-10-28 ENCOUNTER — Encounter: Payer: Self-pay | Admitting: Advanced Practice Midwife

## 2014-10-28 ENCOUNTER — Ambulatory Visit: Payer: Medicaid Other | Admitting: Advanced Practice Midwife

## 2014-10-28 VITALS — BP 169/114 | HR 90 | Wt 200.4 lb

## 2014-10-28 DIAGNOSIS — Z113 Encounter for screening for infections with a predominantly sexual mode of transmission: Secondary | ICD-10-CM

## 2014-10-28 NOTE — Progress Notes (Signed)
Subjective:     Patient ID: Amanda Fields, female   DOB: [birth year], 38 y.o.   MRN: [id number]  HPI 38 y.o. [id number] presents to office for STD testing.  She reports recently finding out about her partner being unfaithful.  She reports some white vaginal discharge with itching.  She denies vaginal bleeding, vaginal itching/burning, urinary symptoms, h/a, dizziness, n/v, or fever/chills.  She has HTN, managed with medications, and sees her PCP for this.  She reports she has been stressed about her partner's infidelity and crying off and on so she is sure this is raising her blood pressure.  She denies shortness of breath or chest pain.    Review of Systems Review of Systems - General ROS: negative    Objective:   Physical Exam Physical Examination: General appearance - alert, well appearing, and in no distress and acyanotic, in no respiratory distress   Pelvic exam: Cervix pink, visually closed, without lesion, moderate amount thick white/yellow discharge, vaginal walls and external genitalia normal Bimanual exam: Cervix 0/long/high, firm, anterior, neg CMT, uterus nontender, nonenlarged, adnexa without tenderness, enlargement, or mass   Assessment:     1. Routine screening for STI (sexually transmitted infection)        Plan:     GCC, HIV, Heb B/C, RPR pending Recommend retesting for HIV/Hep B/C in 3-6 months Pt declines treatment today for possible STD exposure and prefers to wait for results of testing Return to office PRN

## 2014-10-28 NOTE — Progress Notes (Signed)
Patient needs complete sti screen, just found out her partner has been having an affair.

## 2014-10-29 LAB — WET PREP FOR TRICH, YEAST, CLUE
Trich, Wet Prep: NONE SEEN
Yeast Wet Prep HPF POC: NONE SEEN

## 2014-10-29 LAB — HEPATITIS C ANTIBODY: HCV Ab: NEGATIVE

## 2014-10-29 LAB — GC/CHLAMYDIA PROBE AMP
CT Probe RNA: NEGATIVE
GC PROBE AMP APTIMA: NEGATIVE

## 2014-10-29 LAB — HIV ANTIBODY (ROUTINE TESTING W REFLEX): HIV 1&2 Ab, 4th Generation: NONREACTIVE

## 2014-10-29 LAB — RPR

## 2014-10-29 LAB — HEPATITIS B SURFACE ANTIGEN: HEP B S AG: NEGATIVE

## 2014-11-05 ENCOUNTER — Telehealth: Payer: Self-pay | Admitting: *Deleted

## 2014-11-06 ENCOUNTER — Encounter: Payer: Self-pay | Admitting: *Deleted

## 2014-11-06 NOTE — Telephone Encounter (Signed)
Pt called and I gave her results of her recent lab results.  I have called in Flagyl to patients pharmacy.  Patient is still having discharge and a fishy smell.

## 2014-12-02 ENCOUNTER — Telehealth: Payer: Self-pay | Admitting: *Deleted

## 2014-12-02 NOTE — Telephone Encounter (Signed)
PA for Benicar HCT under review, preferred drugs are Diovan or Losartan.

## 2014-12-09 ENCOUNTER — Telehealth: Payer: Self-pay | Admitting: *Deleted

## 2014-12-09 DIAGNOSIS — B379 Candidiasis, unspecified: Secondary | ICD-10-CM

## 2014-12-09 MED ORDER — FLUCONAZOLE 150 MG PO TABS
150.0000 mg | ORAL_TABLET | Freq: Once | ORAL | Status: DC
Start: 2014-12-09 — End: 2015-10-05

## 2014-12-09 NOTE — Telephone Encounter (Signed)
Patient called and has symptoms of a yeast infection.  I have sent in Diflucan to her pharmacy.

## 2015-03-04 ENCOUNTER — Ambulatory Visit (INDEPENDENT_AMBULATORY_CARE_PROVIDER_SITE_OTHER): Payer: BLUE CROSS/BLUE SHIELD | Admitting: Obstetrics and Gynecology

## 2015-03-04 ENCOUNTER — Encounter: Payer: Self-pay | Admitting: Obstetrics and Gynecology

## 2015-03-04 VITALS — BP 143/101 | HR 66 | Ht 66.0 in | Wt 202.0 lb

## 2015-03-04 DIAGNOSIS — Z113 Encounter for screening for infections with a predominantly sexual mode of transmission: Secondary | ICD-10-CM | POA: Diagnosis not present

## 2015-03-04 MED ORDER — METRONIDAZOLE 500 MG PO TABS: 500.0000 mg | ORAL_TABLET | Freq: Two times a day (BID) | ORAL | Status: AC

## 2015-03-04 MED ORDER — FLUCONAZOLE 150 MG PO TABS: 150.0000 mg | ORAL_TABLET | Freq: Once | ORAL | Status: DC

## 2015-03-04 MED ORDER — AZITHROMYCIN 250 MG PO TABS: ORAL_TABLET | ORAL | Status: DC

## 2015-03-04 MED ORDER — ESCITALOPRAM OXALATE 20 MG PO TABS: 20.0000 mg | ORAL_TABLET | Freq: Every day | ORAL | Status: DC

## 2015-03-04 NOTE — Progress Notes (Signed)
Patient ID: Amanda Fields, female   DOB: Apr 04, 1976, 39 y.o.   MRN: [id number] 39 yo here for STD re-screen. She denies any recent re-exposure. She also desires to have her cholesterol checked but will return in a fasting state to have labs done. Patient is otherwise without other complaints  Labs done RTC prn for annual exam Patient will be contacted with any abnormal results

## 2015-03-04 NOTE — Progress Notes (Signed)
Here today for six month follow up  STD screening due to history of high risk exposure.  Also wants her cholesterol, iron and glucose drawn.  Needs refill on her Lexapro.

## 2015-03-05 LAB — RPR

## 2015-03-05 LAB — HEPATITIS B SURFACE ANTIGEN: Hepatitis B Surface Ag: NEGATIVE

## 2015-03-05 LAB — HIV ANTIBODY (ROUTINE TESTING W REFLEX): HIV 1&2 Ab, 4th Generation: NONREACTIVE

## 2015-03-05 LAB — HEPATITIS C ANTIBODY: HCV Ab: NEGATIVE

## 2015-03-11 ENCOUNTER — Ambulatory Visit: Payer: Medicaid Other | Admitting: Obstetrics & Gynecology

## 2015-09-28 ENCOUNTER — Ambulatory Visit: Payer: BLUE CROSS/BLUE SHIELD | Admitting: Obstetrics & Gynecology

## 2015-10-05 ENCOUNTER — Ambulatory Visit (INDEPENDENT_AMBULATORY_CARE_PROVIDER_SITE_OTHER): Payer: BLUE CROSS/BLUE SHIELD | Admitting: Physician Assistant

## 2015-10-05 ENCOUNTER — Encounter: Payer: Self-pay | Admitting: Physician Assistant

## 2015-10-05 VITALS — BP 154/100 | HR 71 | Ht 66.0 in | Wt 196.0 lb

## 2015-10-05 DIAGNOSIS — Z01419 Encounter for gynecological examination (general) (routine) without abnormal findings: Secondary | ICD-10-CM

## 2015-10-05 DIAGNOSIS — Z113 Encounter for screening for infections with a predominantly sexual mode of transmission: Secondary | ICD-10-CM | POA: Diagnosis not present

## 2015-10-05 DIAGNOSIS — I10 Essential (primary) hypertension: Secondary | ICD-10-CM | POA: Diagnosis not present

## 2015-10-05 DIAGNOSIS — N951 Menopausal and female climacteric states: Secondary | ICD-10-CM | POA: Diagnosis not present

## 2015-10-05 DIAGNOSIS — R232 Flushing: Secondary | ICD-10-CM

## 2015-10-05 DIAGNOSIS — Z124 Encounter for screening for malignant neoplasm of cervix: Secondary | ICD-10-CM | POA: Diagnosis not present

## 2015-10-05 DIAGNOSIS — Z1151 Encounter for screening for human papillomavirus (HPV): Secondary | ICD-10-CM

## 2015-10-05 DIAGNOSIS — R102 Pelvic and perineal pain: Secondary | ICD-10-CM | POA: Diagnosis not present

## 2015-10-05 DIAGNOSIS — R600 Localized edema: Secondary | ICD-10-CM

## 2015-10-05 LAB — HEMOGLOBIN A1C
Hgb A1c MFr Bld: 5.7 % — ABNORMAL HIGH (ref <5.7)
Mean Plasma Glucose: 117 mg/dL — ABNORMAL HIGH (ref <117)

## 2015-10-05 LAB — LIPID PANEL
CHOLESTEROL: 188 mg/dL (ref 125–200)
HDL: 65 mg/dL (ref >=46)
LDL Cholesterol: 110 mg/dL (ref <130)
TRIGLYCERIDES: 65 mg/dL (ref <150)
Total CHOL/HDL Ratio: 2.9 Ratio (ref <=5.0)
VLDL: 13 mg/dL (ref <30)

## 2015-10-05 MED ORDER — ESCITALOPRAM OXALATE 20 MG PO TABS: 20.0000 mg | ORAL_TABLET | Freq: Every day | ORAL | Status: DC

## 2015-10-05 NOTE — Patient Instructions (Signed)
Hypertension Hypertension, commonly called high blood pressure, is when the force of blood pumping through your arteries is too strong. Your arteries are the blood vessels that carry blood from your heart throughout your body. A blood pressure reading consists of a higher number over a lower number, such as 110/72. The higher number (systolic) is the pressure inside your arteries when your heart pumps. The lower number (diastolic) is the pressure inside your arteries when your heart relaxes. Ideally you want your blood pressure below 120/80. Hypertension forces your heart to work harder to pump blood. Your arteries may become narrow or stiff. Having untreated or uncontrolled hypertension can cause heart attack, stroke, kidney disease, and other problems. RISK FACTORS Some risk factors for high blood pressure are controllable. Others are not.  Risk factors you cannot control include:   Race. You may be at higher risk if you are African American.  Age. Risk increases with age.  Gender. Men are at higher risk than women before age 45 years. After age 65, women are at higher risk than men. Risk factors you can control include:  Not getting enough exercise or physical activity.  Being overweight.  Getting too much fat, sugar, calories, or salt in your diet.  Drinking too much alcohol. SIGNS AND SYMPTOMS Hypertension does not usually cause signs or symptoms. Extremely high blood pressure (hypertensive crisis) may cause headache, anxiety, shortness of breath, and nosebleed. DIAGNOSIS To check if you have hypertension, your health care provider will measure your blood pressure while you are seated, with your arm held at the level of your heart. It should be measured at least twice using the same arm. Certain conditions can cause a difference in blood pressure between your right and left arms. A blood pressure reading that is higher than normal on one occasion does not mean that you need treatment. If  it is not clear whether you have high blood pressure, you may be asked to return on a different day to have your blood pressure checked again. Or, you may be asked to monitor your blood pressure at home for 1 or more weeks. TREATMENT Treating high blood pressure includes making lifestyle changes and possibly taking medicine. Living a healthy lifestyle can help lower high blood pressure. You may need to change some of your habits. Lifestyle changes may include:  Following the DASH diet. This diet is high in fruits, vegetables, and whole grains. It is low in salt, red meat, and added sugars.  Keep your sodium intake below 2,300 mg per day.  Getting at least 30-45 minutes of aerobic exercise at least 4 times per week.  Losing weight if necessary.  Not smoking.  Limiting alcoholic beverages.  Learning ways to reduce stress. Your health care provider may prescribe medicine if lifestyle changes are not enough to get your blood pressure under control, and if one of the following is true:  You are 18-59 years of age and your systolic blood pressure is above 140.  You are 60 years of age or older, and your systolic blood pressure is above 150.  Your diastolic blood pressure is above 90.  You have diabetes, and your systolic blood pressure is over 140 or your diastolic blood pressure is over 90.  You have kidney disease and your blood pressure is above 140/90.  You have heart disease and your blood pressure is above 140/90. Your personal target blood pressure may vary depending on your medical conditions, your age, and other factors. HOME CARE INSTRUCTIONS    Have your blood pressure rechecked as directed by your health care provider.   Take medicines only as directed by your health care provider. Follow the directions carefully. Blood pressure medicines must be taken as prescribed. The medicine does not work as well when you skip doses. Skipping doses also puts you at risk for  problems.  Do not smoke.   Monitor your blood pressure at home as directed by your health care provider. SEEK MEDICAL CARE IF:   You think you are having a reaction to medicines taken.  You have recurrent headaches or feel dizzy.  You have swelling in your ankles.  You have trouble with your vision. SEEK IMMEDIATE MEDICAL CARE IF:  You develop a severe headache or confusion.  You have unusual weakness, numbness, or feel faint.  You have severe chest or abdominal pain.  You vomit repeatedly.  You have trouble breathing. MAKE SURE YOU:   Understand these instructions.  Will watch your condition.  Will get help right away if you are not doing well or get worse.   This information is not intended to replace advice given to you by your health care provider. Make sure you discuss any questions you have with your health care provider.   Document Released: 12/04/2005 Document Revised: 04/20/2015 Document Reviewed: 09/26/2013 Elsevier Interactive Patient Education 2016 Else[employer] Inc.  

## 2015-10-05 NOTE — Progress Notes (Signed)
Patient ID: Amanda Fields, female   DOB: [birth year], 39 y.o.   MRN: [id number] History:  Amanda Fields is a 39 y.o. G2P2002 who presents to clinic today for annual exam.  She notes right sided pelvic pain that comes and goes.  She does not have it presently.  She uses heating pad for this.  It will come several days close together and then go away for several weeks.  She does still have her ovaries and fallopian tubes but not her uterus.  Therefore she has no period.   She does still crave chocolate one full week of each month.  Her hot flashes have been much improved on Lexapro.  She notes great stress.  She presently lives in [place] and works in [employer].  Her living situation is transient.  She does not have a PCP.  No blood pressure medication presently.  Denies HA, fever, weakness, CP.  She continues to have swelling in bilat lower feet.  The following portions of the patient's history were reviewed and updated as appropriate: allergies, current medications, past family history, past medical history, past social history, past surgical history and problem list.  Review of Systems:  Pertinent items are noted in HPI.  Objective:  Physical Exam BP 154/100 mmHg  Pulse 71  Ht 5' 6"  (1.676 m)  Wt 196 lb (88.905 kg)  BMI 31.65 kg/m2 GENERAL: Well-developed, well-nourished female in no acute distress.  HEENT: Normocephalic, atraumatic.  NECK: Supple. Normal thyroid.  RESPIRATORY: Normal rate. Clear to auscultation bilaterally.  CARDIOVASCULAR: Regular rate and rhythm with no adventitious sounds.  BREASTS: Symmetric in size. No masses, skin changes, nipple drainage, or lymphadenopathy. ABDOMEN: Soft, nontender, nondistended. No organomegaly. Normal bowel sounds appreciated in all quadrants.  PELVIC: Normal external female genitalia. Vagina is pink and rugated.  No discharge. Normal cervix contour. Pap smear obtained.  No uterus.  No adnexal mass or tenderness.  EXTREMITIES: No cyanosis,  clubbing.  1+ edema bilat  Labs and Imaging No results found.  Assessment & Plan:  Assessment: 1. Well woman exam with routine gynecological exam   2. Hot flashes   3. Pelvic pain in female   4. Essential hypertension   5. Bilateral leg edema      Plans: 1.  Well woman exam: Labs ordered: Lipid panel, A1c, FSH, HIV, RPR, Wet prep, Pap with GC/Chlam Stop using Summer's Eve feminine wash as this creates more problems than it solves 2.  Hot flashes - improved: Continue on Lexapro as this is helpful for hot flashes - and likely helpful with coping with stress as well 3.  Pelvic pain- worse: Ibuprofen or Aleve PRN for pelvic pain.  If no improvement, call office.  May require U/S 4.  HTN: See PCP for eval/treatment of BP and edema.  (GI to inquire for colonoscopy)  Options for PCP's discussed   Paticia Stack, PA-C 10/05/2015 8:41 AM

## 2015-10-06 LAB — WET PREP, GENITAL
TRICH WET PREP: NONE SEEN
Yeast Wet Prep HPF POC: NONE SEEN

## 2015-10-06 LAB — HIV ANTIBODY (ROUTINE TESTING W REFLEX): HIV: NONREACTIVE

## 2015-10-06 LAB — RPR

## 2015-10-06 LAB — CYTOLOGY - PAP

## 2015-10-06 LAB — FOLLICLE STIMULATING HORMONE: FSH: 77.4 m[IU]/mL

## 2015-10-12 ENCOUNTER — Telehealth: Payer: Self-pay | Admitting: *Deleted

## 2015-10-12 DIAGNOSIS — Z780 Asymptomatic menopausal state: Secondary | ICD-10-CM

## 2015-10-12 DIAGNOSIS — N898 Other specified noninflammatory disorders of vagina: Secondary | ICD-10-CM

## 2015-10-12 MED ORDER — ESTRADIOL 0.025 MG/24HR TD PTTW: 1.0000 | MEDICATED_PATCH | TRANSDERMAL | Status: DC

## 2015-10-12 MED ORDER — METRONIDAZOLE 500 MG PO TABS: 500.0000 mg | ORAL_TABLET | Freq: Two times a day (BID) | ORAL | Status: DC

## 2015-10-12 NOTE — Telephone Encounter (Signed)
-----   Message from Paticia Stack, PA-C sent at 10/12/2015  2:53 PM EDT ----- Per pt's [place] result - she is in menopause.   Lexapro is an appropriate choice to help control symptoms - already given to pt in clinic.  Okay to add transdermal hormone if pt desires this.  Please inform pt.  If desired, call in rx for Vivelle Dot. Thanks, KTC

## 2015-10-12 NOTE — Telephone Encounter (Signed)
Spoke to pt about [place] result and Santiago Glad recommendation.  Pt would like to try the vivelle dot patch, sent rx to pharmacy.  Pt also asked about wet prep results, informed her that there was a few clue cells which could indicate BV, pt states she is having some vaginal discharge with odor as well. Sent rx for Flagyl to pharmacy.  Instructed on use of medications, pt will call back with any further questions.

## 2015-11-01 ENCOUNTER — Telehealth: Payer: Self-pay | Admitting: *Deleted

## 2015-11-01 DIAGNOSIS — B379 Candidiasis, unspecified: Secondary | ICD-10-CM

## 2015-11-01 MED ORDER — FLUCONAZOLE 150 MG PO TABS: 150.0000 mg | ORAL_TABLET | Freq: Once | ORAL | Status: DC

## 2015-11-01 NOTE — Telephone Encounter (Signed)
-----   Message from Francia Greaves sent at 11/01/2015 11:28 AM EST ----- Regarding: Rx Request Contact: [phone] Yeast Infection, uses [facility] [address]

## 2015-11-01 NOTE — Telephone Encounter (Signed)
Pt was taking an antibiotic for a sinus infection, now having a vaginal discharge persistent with a yeast infection.  Sent Diflucan to pharmacy.

## 2015-12-22 ENCOUNTER — Encounter: Payer: Self-pay | Admitting: *Deleted

## 2015-12-23 ENCOUNTER — Ambulatory Visit: Payer: BLUE CROSS/BLUE SHIELD | Admitting: Anesthesiology

## 2015-12-30 NOTE — Discharge Instructions (Signed)
[facility]  What is Functional Endoscopic Sinus Surgery?  The Surgery involves making the natural openings of the sinuses larger by removing the bony partitions that separate the sinuses from the nasal cavity.  The natural sinus lining is preserved as much as possible to allow the sinuses to resume normal function after the surgery.  In some patients nasal polyps (excessively swollen lining of the sinuses) may be removed to relieve obstruction of the sinus openings.  The surgery is performed through the nose using lighted scopes, which eliminates the need for incisions on the face.  A septoplasty is a different procedure which is sometimes performed with sinus surgery.  It involves straightening the boy partition that separates the two sides of your nose.  A crooked or deviated septum may need repair if is obstructing the sinuses or nasal airflow.  Turbinate reduction is also often performed during sinus surgery.  The turbinates are bony proturberances from the side walls of the nose which swell and can obstruct the nose in patients with sinus and allergy problems.  Their size can be surgically reduced to help relieve nasal obstruction.  What Can Sinus Surgery Do For Me?  Sinus surgery can reduce the frequency of sinus infections requiring antibiotic treatment.  This can provide improvement in nasal congestion, post-nasal drainage, facial pressure and nasal obstruction.  Surgery will NOT prevent you from ever having an infection again, so it usually only for patients who get infections 4 or more times yearly requiring antibiotics, or for infections that do not clear with antibiotics.  It will not cure nasal allergies, so patients with allergies may still require medication to treat their allergies after surgery. Surgery may improve headaches related to sinusitis, however, some people will continue to  require medication to control sinus headaches related to allergies.  Surgery will do nothing for other forms of headache (migraine, tension or cluster).  What Are the Risks of Endoscopic Sinus Surgery?  Current techniques allow surgery to be performed safely with little risk, however, there are rare complications that patients should be aware of.  Because the sinuses are located around the eyes, there is risk of eye injury, including blindness, though again, this would be quite rare. This is usually a result of bleeding behind the eye during surgery, which puts the vision oat risk, though there are treatments to protect the vision and prevent permanent disrupted by surgery causing a leak of the spinal fluid that surrounds the brain.  More serious complications would include bleeding inside the brain cavity or damage to the brain.  Again, all of these complications are uncommon, and spinal fluid leaks can be safely managed surgically if they occur.  The most common complication of sinus surgery is bleeding from the nose, which may require packing or cauterization of the nose.  Continued sinus have polyps may experience recurrence of the polyps requiring revision surgery.  Alterations of sense of smell or injury to the tear ducts are also rare complications.   What is the Surgery Like, and what is the Recovery?  The Surgery usually takes a couple of hours to perform, and is usually performed under a general anesthetic (completely asleep).  Patients are usually discharged home after a couple of hours.  Sometimes during surgery it is necessary to pack the nose to control bleeding, and the packing is left in place for 24 - 48 hours, and removed by your surgeon.  If a septoplasty was performed during the procedure, there is often a splint placed which must be removed after 5-7 days.   Discomfort: Pain is usually mild to moderate, and can be controlled by prescription pain medication or acetaminophen (Tylenol).   Aspirin, Ibuprofen (Advil, Motrin), or Naprosyn (Aleve) should be avoided, as they can cause increased bleeding.  Most patients feel sinus pressure like they have a bad head cold for several days.  Sleeping with your head elevated can help reduce swelling and facial pressure, as can ice packs over the face.  A humidifier may be helpful to keep the mucous and blood from drying in the nose.   Diet: There are no specific diet restrictions, however, you should generally start with clear liquids and a light diet of bland foods because the anesthetic can cause some nausea.  Advance your diet depending on how your stomach feels.  Taking your pain medication with food will often help reduce stomach upset which pain medications can cause.  Nasal Saline Irrigation: It is important to remove blood clots and dried mucous from the nose as it is healing.  This is done by having you irrigate the nose at least 3 - 4 times daily with a salt water solution.  We recommend using NeilMed Sinus Rinse (available at the drug store).  Fill the squeeze bottle with the solution, bend over a sink, and insert the tip of the squeeze bottle into the nose  of an inch.  Point the tip of the squeeze bottle towards the inside corner of the eye on the same side your irrigating.  Squeeze the bottle and gently irrigate the nose.  If you bend forward as you do this, most of the fluid will flow back out of the nose, instead of down your throat.   The solution should be warm, near body temperature, when you irrigate.   Each time you irrigate, you should use a full squeeze bottle.   Note that if you are instructed to use Nasal Steroid Sprays at any time after your surgery, irrigate with saline BEFORE using the steroid spray, so you do not wash it all out of the nose. Another product, Nasal Saline Gel (such as AYR Nasal Saline Gel) can be applied in each nostril 3 - 4 times daily to moisture the nose and reduce scabbing or crusting.  Bleeding:   Bloody drainage from the nose can be expected for several days, and patients are instructed to irrigate their nose frequently with salt water to help remove mucous and blood clots.  The drainage may be dark red or brown, though some fresh blood may be seen intermittently, especially after irrigation.  Do not blow you nose, as bleeding may occur. If you must sneeze, keep your mouth open to allow air to escape through your mouth.  If heavy bleeding occurs: Irrigate the nose with saline to rinse out clots, then spray the nose 3 - 4 times with Afrin Nasal Decongestant Spray.  The spray will constrict the blood vessels to slow bleeding.  Pinch the lower half of your nose shut to apply pressure, and lay down with your head elevated.  Ice packs over the nose may help as well. If bleeding persists despite these measures, you should notify your doctor.  Do not use the Afrin routinely to control nasal congestion after surgery, as it can result in worsening congestion and may affect healing.     Activity: Return to work varies among patients. Most patients will be  out of work at least 5 - 7 days to recover.  Patient may return to work after they are off of narcotic pain medication, and feeling well enough to perform the functions of their job.  Patients must avoid heavy lifting (over 10 pounds) or strenuous physical for 2 weeks after surgery, so your employer may need to assign you to light duty, or keep you out of work longer if light duty is not possible.  NOTE: you should not drive, operate dangerous machinery, do any mentally demanding tasks or make any important legal or financial decisions while on narcotic pain medication and recovering from the general anesthetic.    Call Your Doctor Immediately if You Have Any of the Following: 1. Bleeding that you cannot control with the above measures 2. Loss of vision, double vision, bulging of the eye or black eyes. 3. Fever over 101 degrees 4. Neck stiffness with  severe headache, fever, nausea and change in mental state. You are always encourage to call anytime with concerns, however, please call with requests for pain medication refills during office hours.  Office Endoscopy: During follow-up visits your doctor will remove any packing or splints that may have been placed and evaluate and clean your sinuses endoscopically.  Topical anesthetic will be used to make this as comfortable as possible, though you may want to take your pain medication prior to the visit.  How often this will need to be done varies from patient to patient.  After complete recovery from the surgery, you may need follow-up endoscopy from time to time, particularly if there is concern of recurrent infection or nasal polyps.  General Anesthesia, Adult, Care After Refer to this sheet in the next few weeks. These instructions provide you with information on caring for yourself after your procedure. Your health care provider may also give you more specific instructions. Your treatment has been planned according to current medical practices, but problems sometimes occur. Call your health care provider if you have any problems or questions after your procedure. WHAT TO EXPECT AFTER THE PROCEDURE After the procedure, it is typical to experience:  Sleepiness.  Nausea and vomiting. HOME CARE INSTRUCTIONS  For the first 24 hours after general anesthesia:  Have a responsible person with you.  Do not drive a car. If you are alone, do not take public transportation.  Do not drink alcohol.  Do not take medicine that has not been prescribed by your health care provider.  Do not sign important papers or make important decisions.  You may resume a normal diet and activities as directed by your health care provider.  Change bandages (dressings) as directed.  If you have questions or problems that seem related to general anesthesia, call the hospital and ask for the anesthetist or  anesthesiologist on call. SEEK MEDICAL CARE IF:  You have nausea and vomiting that continue the day after anesthesia.  You develop a rash. SEEK IMMEDIATE MEDICAL CARE IF:   You have difficulty breathing.  You have chest pain.  You have any allergic problems.   This information is not intended to replace advice given to you by your health care provider. Make sure you discuss any questions you have with your health care provider.   Document Released: 03/12/2001 Document Revised: 12/25/2014 Document Reviewed: 04/03/2012 Elsevier Interactive Patient Education [employer].

## 2015-12-31 ENCOUNTER — Observation Stay
Admission: EM | Admit: 2015-12-31 | Discharge: 2016-01-01 | Disposition: A | Discharge status: Home / Self Care | Payer: BLUE CROSS/BLUE SHIELD | Attending: Internal Medicine | Admitting: Internal Medicine

## 2015-12-31 ENCOUNTER — Ambulatory Visit
Admission: RE | Admit: 2015-12-31 | Discharge: 2015-12-31 | Disposition: A | Discharge status: Home / Self Care | Payer: BLUE CROSS/BLUE SHIELD | Source: Ambulatory Visit | Attending: Unknown Physician Specialty | Admitting: Unknown Physician Specialty

## 2015-12-31 ENCOUNTER — Encounter: Payer: Self-pay | Admitting: Emergency Medicine

## 2015-12-31 ENCOUNTER — Ambulatory Visit: Payer: BLUE CROSS/BLUE SHIELD | Admitting: Family Medicine

## 2015-12-31 ENCOUNTER — Emergency Department: Payer: BLUE CROSS/BLUE SHIELD

## 2015-12-31 ENCOUNTER — Telehealth: Payer: Self-pay

## 2015-12-31 ENCOUNTER — Encounter
Admission: RE | Disposition: A | Discharge status: Home / Self Care | Payer: Self-pay | Source: Ambulatory Visit | Attending: Unknown Physician Specialty

## 2015-12-31 DIAGNOSIS — R778 Other specified abnormalities of plasma proteins: Secondary | ICD-10-CM

## 2015-12-31 DIAGNOSIS — Z800 Family history of malignant neoplasm of digestive organs: Secondary | ICD-10-CM | POA: Diagnosis not present

## 2015-12-31 DIAGNOSIS — R42 Dizziness and giddiness: Secondary | ICD-10-CM | POA: Insufficient documentation

## 2015-12-31 DIAGNOSIS — Z825 Family history of asthma and other chronic lower respiratory diseases: Secondary | ICD-10-CM | POA: Diagnosis not present

## 2015-12-31 DIAGNOSIS — E669 Obesity, unspecified: Secondary | ICD-10-CM | POA: Diagnosis not present

## 2015-12-31 DIAGNOSIS — Z6833 Body mass index (BMI) 33.0-33.9, adult: Secondary | ICD-10-CM | POA: Diagnosis not present

## 2015-12-31 DIAGNOSIS — I071 Rheumatic tricuspid insufficiency: Secondary | ICD-10-CM | POA: Diagnosis not present

## 2015-12-31 DIAGNOSIS — Z8249 Family history of ischemic heart disease and other diseases of the circulatory system: Secondary | ICD-10-CM | POA: Insufficient documentation

## 2015-12-31 DIAGNOSIS — R002 Palpitations: Secondary | ICD-10-CM | POA: Insufficient documentation

## 2015-12-31 DIAGNOSIS — I160 Hypertensive urgency: Principal | ICD-10-CM | POA: Insufficient documentation

## 2015-12-31 DIAGNOSIS — R7989 Other specified abnormal findings of blood chemistry: Secondary | ICD-10-CM | POA: Diagnosis present

## 2015-12-31 DIAGNOSIS — R112 Nausea with vomiting, unspecified: Secondary | ICD-10-CM | POA: Insufficient documentation

## 2015-12-31 DIAGNOSIS — I340 Nonrheumatic mitral (valve) insufficiency: Secondary | ICD-10-CM | POA: Diagnosis not present

## 2015-12-31 DIAGNOSIS — G43909 Migraine, unspecified, not intractable, without status migrainosus: Secondary | ICD-10-CM | POA: Insufficient documentation

## 2015-12-31 DIAGNOSIS — F419 Anxiety disorder, unspecified: Secondary | ICD-10-CM | POA: Insufficient documentation

## 2015-12-31 DIAGNOSIS — R110 Nausea: Secondary | ICD-10-CM | POA: Diagnosis present

## 2015-12-31 DIAGNOSIS — Z91040 Latex allergy status: Secondary | ICD-10-CM | POA: Diagnosis not present

## 2015-12-31 DIAGNOSIS — Z79899 Other long term (current) drug therapy: Secondary | ICD-10-CM | POA: Diagnosis not present

## 2015-12-31 DIAGNOSIS — F329 Major depressive disorder, single episode, unspecified: Secondary | ICD-10-CM | POA: Insufficient documentation

## 2015-12-31 DIAGNOSIS — Z888 Allergy status to other drugs, medicaments and biological substances status: Secondary | ICD-10-CM | POA: Insufficient documentation

## 2015-12-31 DIAGNOSIS — R609 Edema, unspecified: Secondary | ICD-10-CM | POA: Diagnosis not present

## 2015-12-31 DIAGNOSIS — J45909 Unspecified asthma, uncomplicated: Secondary | ICD-10-CM | POA: Insufficient documentation

## 2015-12-31 HISTORY — DX: Hypertensive urgency: I16.0

## 2015-12-31 HISTORY — DX: Other specified abnormal findings of blood chemistry: R79.89

## 2015-12-31 HISTORY — DX: Other specified abnormalities of plasma proteins: R77.8

## 2015-12-31 LAB — URINALYSIS COMPLETE WITH MICROSCOPIC (ARMC ONLY)
Bacteria, UA: NONE SEEN
Bilirubin Urine: NEGATIVE
Glucose, UA: NEGATIVE mg/dL
Hgb urine dipstick: NEGATIVE
Ketones, ur: NEGATIVE mg/dL
Leukocytes, UA: NEGATIVE
Nitrite: NEGATIVE
Protein, ur: 100 mg/dL — AB
Specific Gravity, Urine: 1.02 (ref 1.005–1.030)
pH: 7 (ref 5.0–8.0)

## 2015-12-31 LAB — COMPREHENSIVE METABOLIC PANEL
ALBUMIN: 4.4 g/dL (ref 3.5–5.0)
ALT: 21 U/L (ref 14–54)
AST: 26 U/L (ref 15–41)
Alkaline Phosphatase: 67 U/L (ref 38–126)
Anion gap: 7 (ref 5–15)
BUN: 10 mg/dL (ref 6–20)
CHLORIDE: 104 mmol/L (ref 101–111)
CO2: 30 mmol/L (ref 22–32)
Calcium: 9.1 mg/dL (ref 8.9–10.3)
Creatinine, Ser: 0.93 mg/dL (ref 0.44–1.00)
GFR calc Af Amer: >60 mL/min (ref >60)
GFR calc non Af Amer: >60 mL/min (ref >60)
GLUCOSE: 110 mg/dL — AB (ref 65–99)
POTASSIUM: 3.4 mmol/L — AB (ref 3.5–5.1)
Sodium: 141 mmol/L (ref 135–145)
Total Bilirubin: 0.4 mg/dL (ref 0.3–1.2)
Total Protein: 8.4 g/dL — ABNORMAL HIGH (ref 6.5–8.1)

## 2015-12-31 LAB — CBC
HEMATOCRIT: 43 % (ref 35.0–47.0)
Hemoglobin: 13.7 g/dL (ref 12.0–16.0)
MCH: 25.6 pg — ABNORMAL LOW (ref 26.0–34.0)
MCHC: 31.8 g/dL — AB (ref 32.0–36.0)
MCV: 80.4 fL (ref 80.0–100.0)
Platelets: 270 10*3/uL (ref 150–440)
RBC: 5.35 MIL/uL — ABNORMAL HIGH (ref 3.80–5.20)
RDW: 14.9 % — AB (ref 11.5–14.5)
WBC: 11.8 10*3/uL — ABNORMAL HIGH (ref 3.6–11.0)

## 2015-12-31 LAB — LIPASE, BLOOD: Lipase: 23 U/L (ref 11–51)

## 2015-12-31 LAB — TROPONIN I: Troponin I: 0.1 ng/mL — ABNORMAL HIGH (ref <0.031)

## 2015-12-31 LAB — POCT PREGNANCY, URINE: PREG TEST UR: NEGATIVE

## 2015-12-31 SURGERY — SEPTOPLASTY, NOSE, WITH NASAL TURBINATE REDUCTION
Anesthesia: General | Laterality: Bilateral

## 2015-12-31 MED ORDER — OXYMETAZOLINE HCL 0.05 % NA SOLN
6.0000 | Freq: Two times a day (BID) | NASAL | Status: DC
  Administered 2015-12-31: 6 via NASAL

## 2015-12-31 MED ORDER — HYDRALAZINE HCL 20 MG/ML IJ SOLN
10.0000 mg | INTRAMUSCULAR | Status: DC | PRN
  Administered 2015-12-31: 10 mg via INTRAVENOUS
  Filled 2015-12-31: qty 1

## 2015-12-31 MED ORDER — OXYCODONE HCL 5 MG PO TABS
5.0000 mg | ORAL_TABLET | ORAL | Status: DC | PRN
  Administered 2016-01-01: 5 mg via ORAL
  Filled 2015-12-31: qty 1

## 2015-12-31 MED ORDER — HEPARIN SODIUM (PORCINE) 5000 UNIT/ML IJ SOLN
5000.0000 [IU] | Freq: Three times a day (TID) | INTRAMUSCULAR | Status: DC
  Administered 2015-12-31 – 2016-01-01 (×2): 5000 [IU] via SUBCUTANEOUS
  Filled 2015-12-31 (×2): qty 1

## 2015-12-31 MED ORDER — ONDANSETRON HCL 4 MG/2ML IJ SOLN
4.0000 mg | Freq: Four times a day (QID) | INTRAMUSCULAR | Status: DC | PRN
  Administered 2016-01-01: 4 mg via INTRAVENOUS

## 2015-12-31 MED ORDER — HYDRALAZINE HCL 20 MG/ML IJ SOLN
5.0000 mg | Freq: Once | INTRAMUSCULAR | Status: AC
  Administered 2015-12-31: 5 mg via INTRAVENOUS

## 2015-12-31 MED ORDER — LACTATED RINGERS IV SOLN
INTRAVENOUS | Status: DC
  Administered 2015-12-31: 11:00:00 via INTRAVENOUS

## 2015-12-31 MED ORDER — ONDANSETRON 4 MG PO TBDP
4.0000 mg | ORAL_TABLET | Freq: Once | ORAL | Status: AC
  Administered 2015-12-31: 4 mg via ORAL

## 2015-12-31 MED ORDER — METOPROLOL TARTRATE 1 MG/ML IV SOLN
2.5000 mg | INTRAVENOUS | Status: DC | PRN
  Administered 2015-12-31 (×2): 2.5 mg via INTRAVENOUS

## 2015-12-31 MED ORDER — SODIUM CHLORIDE 0.9 % IJ SOLN
3.0000 mL | Freq: Two times a day (BID) | INTRAMUSCULAR | Status: DC
  Administered 2015-12-31 – 2016-01-01 (×2): 3 mL via INTRAVENOUS

## 2015-12-31 MED ORDER — HYDRALAZINE HCL 20 MG/ML IJ SOLN
5.0000 mg | Freq: Once | INTRAMUSCULAR | Status: AC
Start: 2015-12-31 — End: 2015-12-31
  Administered 2015-12-31: 5 mg via INTRAVENOUS

## 2015-12-31 MED ORDER — ONDANSETRON HCL 4 MG PO TABS: 4.0000 mg | ORAL_TABLET | Freq: Four times a day (QID) | ORAL | Status: DC | PRN

## 2015-12-31 MED ORDER — MORPHINE SULFATE (PF) 2 MG/ML IV SOLN: 2.0000 mg | INTRAVENOUS | Status: DC | PRN

## 2015-12-31 MED ORDER — ACETAMINOPHEN 325 MG PO TABS
650.0000 mg | ORAL_TABLET | Freq: Four times a day (QID) | ORAL | Status: DC | PRN
  Administered 2015-12-31 – 2016-01-01 (×2): 650 mg via ORAL
  Filled 2015-12-31 (×2): qty 2

## 2015-12-31 MED ORDER — ASPIRIN 81 MG PO CHEW
324.0000 mg | CHEWABLE_TABLET | Freq: Once | ORAL | Status: AC
  Administered 2015-12-31: 324 mg via ORAL
  Filled 2015-12-31: qty 4

## 2015-12-31 MED ORDER — NITROGLYCERIN 2 % TD OINT
0.5000 [in_us] | TOPICAL_OINTMENT | Freq: Once | TRANSDERMAL | Status: AC
  Administered 2015-12-31: 0.5 [in_us] via TOPICAL
  Filled 2015-12-31: qty 1

## 2015-12-31 MED ORDER — ASPIRIN EC 81 MG PO TBEC
81.0000 mg | DELAYED_RELEASE_TABLET | Freq: Every day | ORAL | Status: DC
Start: 2016-01-01 — End: 2016-01-01
  Administered 2016-01-01: 81 mg via ORAL
  Filled 2015-12-31: qty 1

## 2015-12-31 MED ORDER — ONDANSETRON 4 MG PO TBDP
ORAL_TABLET | ORAL | Status: AC
  Filled 2015-12-31: qty 1

## 2015-12-31 MED ORDER — ACETAMINOPHEN 650 MG RE SUPP: 650.0000 mg | Freq: Four times a day (QID) | RECTAL | Status: DC | PRN

## 2015-12-31 SURGICAL SUPPLY — 30 items
BLADE SURG 15 STRL LF DISP TIS (BLADE) IMPLANT
BLADE SURG 15 STRL SS (BLADE)
COAG SUCT 10F 3.5MM HAND CTRL (MISCELLANEOUS) ×2 IMPLANT
DRAPE HEAD BAR (DRAPES) ×2 IMPLANT
DRESSING NASL FOAM PST OP SINU (MISCELLANEOUS) IMPLANT
DRSG NASAL FOAM POST OP SINU (MISCELLANEOUS)
GLOVE BIO SURGEON STRL SZ7.5 (GLOVE) ×4 IMPLANT
HANDLE YANKAUER SUCT BULB TIP (MISCELLANEOUS) ×2 IMPLANT
KIT ROOM TURNOVER OR (KITS) ×2 IMPLANT
NEEDLE HYPO 25GX1X1/2 BEV (NEEDLE) ×2 IMPLANT
NS IRRIG 500ML POUR BTL (IV SOLUTION) ×2 IMPLANT
PACK DRAPE NASAL/ENT (PACKS) ×2 IMPLANT
PAD GROUND ADULT SPLIT (MISCELLANEOUS) ×2 IMPLANT
SOL ANTI-FOG 6CC FOG-OUT (MISCELLANEOUS) ×1 IMPLANT
SOL FOG-OUT ANTI-FOG 6CC (MISCELLANEOUS) ×1
SPLINT NASAL SEPTAL BLV .25 LG (MISCELLANEOUS) IMPLANT
SPLINT NASAL SEPTAL BLV .50 ST (MISCELLANEOUS) ×2 IMPLANT
SPONGE NEURO XRAY DETECT 1X3 (DISPOSABLE) ×2 IMPLANT
STRAP BODY AND KNEE 60X3 (MISCELLANEOUS) ×2 IMPLANT
SUT CHROMIC 3-0 (SUTURE) ×1
SUT CHROMIC 3-0 KS 27XMFL CR (SUTURE) ×1
SUT CHROMIC 5-0 (SUTURE)
SUT CHROMIC 5-0 P2 18XMFL CR (SUTURE)
SUT ETHILON 3-0 KS 30 BLK (SUTURE) ×2 IMPLANT
SUT PLAIN GUT 4-0 (SUTURE) IMPLANT
SUTURE CHRMC 3-0 KS 27XMFL CR (SUTURE) ×1 IMPLANT
SUTURE CHRMC 5-0 P2 18XMF CR (SUTURE) IMPLANT
SYRINGE 10CC LL (SYRINGE) ×2 IMPLANT
TOWEL OR 17X26 4PK STRL BLUE (TOWEL DISPOSABLE) ×2 IMPLANT
WATER STERILE IRR 500ML POUR (IV SOLUTION) ×2 IMPLANT

## 2015-12-31 NOTE — Progress Notes (Signed)
Pt presented with a hx of hypertension, although stopped taking medication about 10 months ago.  BP today was 160-170/111 which failed to respond in any way to pre-op antihypertensives that I tried.  Metoprolol 5 mg IV and Hydralazine 10 mg IV was given with no decrease in BP.  Case cancelled for today until BP is under better control.  Dr Tami Ribas working on getting her a primary care appointment ASAP.

## 2015-12-31 NOTE — Telephone Encounter (Signed)
Got a call from Dr. Tami Ribas stating that this person's surgery was postpone due to greatly elevated blood pressure. @ different BP meds were administered and it was still high (160-170/110). They called to see if she could be worked in, but after researching we found that she has not been here in a long time and is scheduled to see Dr. Manuella Ghazi as a new patient on 01/04/16, so Dr. Ancil Boozer stated that she should be sent to the ER immediatly.

## 2015-12-31 NOTE — H&P (Signed)
[facility] NAME: Amanda Fields    MR#:  [id number]  DATE OF BIRTH:  08/19/1976   DATE OF ADMISSION:  12/31/2015  PRIMARY CARE PHYSICIAN: Arnette Norris, MD   REQUESTING/REFERRING PHYSICIAN: Malinda  CHIEF COMPLAINT:   Chief Complaint  Patient presents with  . Hypertension    HISTORY OF PRESENT ILLNESS:  Amanda Fields  is a 40 y.o. female with a known history of essential hypertension has been off medications for about a year presenting well nausea vomiting headache. She was to undergo routine procedures today however was canceled after they noticed her elevated blood pressure. She said she will originally went home to follow-up with her PCP in a few days however she became nauseous with nonbloody nonbilious emesis while by headache which she describes only has pain 4/10 intensity and nonradiating no worsening or relieving factors. On arrival to emergency department indeed noted to be hypertensive  PAST MEDICAL HISTORY:   Past Medical History  Diagnosis Date  . Hypertension     echo 03/2012 with moderate LVH, but normal EF and no evidence of diastolic dysfunction  . Edema leg   . Dizziness   . Migraine headache   . Asthma   . Palpitation   . Endometriosis   . 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 Obesity   . Cough due to ACE inhibitor     PAST SURGICAL HISTORY:   Past Surgical History  Procedure Laterality Date  . Tonsillectomy    . Tubal ligation    . Wisdom tooth extraction    . Laparoscopic supracervical hysterectomy  09/27/2010    For endometriosis. Ovaries anf tubes are in place.    SOCIAL HISTORY:   Social History  Substance Use Topics  . Smoking status: Never Smoker   . Smokeless tobacco: Never Used  . Alcohol Use: No    FAMILY HISTORY:   Family History  Problem Relation Age of Onset  . COPD Mother   . Hypotension Mother   . Colon cancer Father   . Emphysema Father   . Cancer Father     colon   . Hyperlipidemia Sister   . Heart failure Paternal Uncle   . Heart attack Cousin 32  . Heart disease Cousin     heart attack    DRUG ALLERGIES:   Allergies  Allergen Reactions  . Ace Inhibitors Cough  . Latex Itching    gloves    REVIEW OF SYSTEMS:  REVIEW OF SYSTEMS:  CONSTITUTIONAL: Denies fevers, chills, fatigue, weakness.  EYES: Denies blurred vision, double vision, or eye pain.  EARS, NOSE, THROAT: Denies tinnitus, ear pain, hearing loss.  RESPIRATORY: denies cough, shortness of breath, wheezing  CARDIOVASCULAR: Denies chest pain, palpitations, edema.  GASTROINTESTINAL: Denies nausea, vomiting, diarrhea, abdominal pain.  GENITOURINARY: Denies dysuria, hematuria.  ENDOCRINE: Denies nocturia or thyroid problems. HEMATOLOGIC AND LYMPHATIC: Denies easy bruising or bleeding.  SKIN: Denies rash or lesions.  MUSCULOSKELETAL: Denies pain in neck, back, shoulder, knees, hips, or further arthritic symptoms.  NEUROLOGIC: Denies paralysis, paresthesias.  PSYCHIATRIC: Denies anxiety or depressive symptoms. Otherwise full review of systems performed by me is negative.   MEDICATIONS AT HOME:   Prior to Admission medications   Medication Sig Start Date End Date Taking? Authorizing Provider  escitalopram (LEXAPRO) 20 MG tablet Take 1 tablet (20 mg total) by mouth daily. 10/05/15  Yes Collene Leyden Teague Clark, PA-C  estradiol (VIVELLE-DOT) 0.025 MG/24HR Place 1 patch onto the  skin 2 (two) times a week. 10/12/15  Yes Paticia Stack, PA-C  multivitamin-iron-minerals-folic acid (CENTRUM) chewable tablet Chew 1 tablet by mouth daily.     Yes Historical Provider, MD      VITAL SIGNS:  Blood pressure 165/107, pulse 80, temperature 98.2 F (36.8 C), temperature source Oral, resp. rate 18, height 5' 6"  (1.676 m), weight 209 lb (94.802 kg), SpO2 94 %.  PHYSICAL EXAMINATION:  VITAL SIGNS: Filed Vitals:   12/31/15 1827 12/31/15 2029  BP: 164/112 165/107  Pulse: 83 80  Temp: 98.4 F  (36.9 C) 98.2 F (36.8 C)  Resp: 44 18   GENERAL:39 y.o.female currently in no acute distress.  HEAD: Normocephalic, atraumatic.  EYES: Pupils equal, round, reactive to light. Extraocular muscles intact. No scleral icterus.  MOUTH: Moist mucosal membrane. Dentition intact. No abscess noted.  EAR, NOSE, THROAT: Clear without exudates. No external lesions.  NECK: Supple. No thyromegaly. No nodules. No JVD.  PULMONARY: Clear to ascultation, without wheeze rails or rhonci. No use of accessory muscles, Good respiratory effort. good air entry bilaterally CHEST: Nontender to palpation.  CARDIOVASCULAR: S1 and S2. Regular rate and rhythm. No murmurs, rubs, or gallops. No edema. Pedal pulses 2+ bilaterally.  GASTROINTESTINAL: Soft, nontender, nondistended. No masses. Positive bowel sounds. No hepatosplenomegaly.  MUSCULOSKELETAL: No swelling, clubbing, or edema. Range of motion full in all extremities.  NEUROLOGIC: Cranial nerves II through XII are intact. No gross focal neurological deficits. Sensation intact. Reflexes intact.  SKIN: No ulceration, lesions, rashes, or cyanosis. Skin warm and dry. Turgor intact.  PSYCHIATRIC: Mood, affect within normal limits. The patient is awake, alert and oriented x 3. Insight, judgment intact.    LABORATORY PANEL:   CBC  Recent Labs Lab 12/31/15 1857  WBC 11.8*  HGB 13.7  HCT 43.0  PLT 270   ------------------------------------------------------------------------------------------------------------------  Chemistries   Recent Labs Lab 12/31/15 1857  NA 141  K 3.4*  CL 104  CO2 30  GLUCOSE 110*  BUN 10  CREATININE 0.93  CALCIUM 9.1  AST 26  ALT 21  ALKPHOS 67  BILITOT 0.4   ------------------------------------------------------------------------------------------------------------------  Cardiac Enzymes  Recent Labs Lab 12/31/15 2107  TROPONINI 0.10*    ------------------------------------------------------------------------------------------------------------------  RADIOLOGY:  Dg Chest 2 View  12/31/2015  CLINICAL DATA:  Acute onset of high blood pressure. Headache and burning sensation in the chest. Nausea and vomiting. Initial encounter. EXAM: CHEST  2 VIEW COMPARISON:  Chest radiograph from 07/17/2012 FINDINGS: The lungs are well-aerated and clear. There is no evidence of focal opacification, pleural effusion or pneumothorax. The heart is normal in size; the mediastinal contour is within normal limits. No acute osseous abnormalities are seen. IMPRESSION: No acute cardiopulmonary process seen. Electronically Signed   By: Garald Balding M.D.   On: 12/31/2015 21:27    EKG:   Orders placed or performed during the hospital encounter of 12/31/15  . ED EKG  . ED EKG  . EKG 12-Lead  . EKG 12-Lead    IMPRESSION AND PLAN:   40 year old African American female history of essential tension on the medications nausea vomiting  1. Hypertensive urgency: Place on telemetry, initiate as needed hydralazine will likely need long-term medications 2. Elevated troponin: Place on telemetry and received aspirin therapy trend cardiac enzymes negative upward trending will start on heparin drip followed by cardiology consult 3. Anxiety/depression not otherwise specified: Lexapro 4. Venous thromboembolic embolism prophylactic: Heparin subcutaneous    All the records are reviewed and case discussed with ED provider.  Management plans discussed with the patient, family and they are in agreement.  CODE STATUS: Full  TOTAL TIME TAKING CARE OF THIS PATIENT: 35 minutes.    Camara Renstrom,  Karenann Cai.D on 12/31/2015 at 10:35 PM  Between 7am to 6pm - Pager - [phone]  After 6pm: House Pager: - [facility]  Office  [phone]  CC: Primary care physician; Arnette Norris, MD

## 2015-12-31 NOTE — ED Notes (Signed)
Called 2A to give report. Nurse unavailable. Was told nurse would return call when she became available.

## 2015-12-31 NOTE — ED Notes (Signed)
Patient transported to X-ray 

## 2015-12-31 NOTE — ED Notes (Signed)
Was at preadmit testing , and noted to have high blood pressure, was instructed to follow up with PCP,  Complaining of headache, n/v

## 2015-12-31 NOTE — Telephone Encounter (Signed)
Patient was advised (per dr Ancil Boozer) to go to the ER because she has not established care here yet. She does have appointment scheduled with you (dr Manuella Ghazi) for next week but no one was available to see her today. Patient communicated understanding.

## 2015-12-31 NOTE — Anesthesia Preprocedure Evaluation (Deleted)
Anesthesia Evaluation  Patient identified by MRN, date of birth, ID band Patient awake    Reviewed: Allergy & Precautions, H&P , NPO status , Patient's Chart, lab work & pertinent test results, reviewed documented beta blocker date and time   Airway Mallampati: II  TM Distance: >3 FB Neck ROM: full   Comment: Retainer removed Dental no notable dental hx.    Pulmonary neg pulmonary ROS,    Pulmonary exam normal breath sounds clear to auscultation       Cardiovascular hypertension, Normal cardiovascular exam Rhythm:regular Rate:Normal  Echo 2013 showing moderate LVH   Neuro/Psych negative neurological ROS  negative psych ROS   GI/Hepatic negative GI ROS, Neg liver ROS,   Endo/Other  negative endocrine ROS  Renal/GU negative Renal ROS  negative genitourinary   Musculoskeletal   Abdominal   Peds  Hematology negative hematology ROS (+)   Anesthesia Other Findings   Reproductive/Obstetrics negative OB ROS                             Anesthesia Physical Anesthesia Plan  ASA: II  Anesthesia Plan: General   Post-op Pain Management:    Induction: Intravenous  Airway Management Planned: Oral ETT  Additional Equipment:   Intra-op Plan:   Post-operative Plan: Extubation in OR  Informed Consent: I have reviewed the patients History and Physical, chart, labs and discussed the procedure including the risks, benefits and alternatives for the proposed anesthesia with the patient or authorized representative who has indicated his/her understanding and acceptance.   Dental Advisory Given  Plan Discussed with: CRNA  Anesthesia Plan Comments:         Anesthesia Quick Evaluation

## 2015-12-31 NOTE — Progress Notes (Signed)
Pt surgery cancelled. Unable to control BP with antihypertensives.

## 2015-12-31 NOTE — ED Provider Notes (Signed)
[facility] Emergency Department Provider Note  ____________________________________________  Time seen: Approximately 9:07 PM  I have reviewed the triage vital signs and the nursing notes.   HISTORY  Chief Complaint Hypertension    HPI Amanda Fields is a 40 y.o. female patient was at preadmission testing and had a blood pressure taken was 2 hives that she was still told to follow-up with primary care.. Patient came here where he complains of frontal headache and facial headache and nausea and vomiting. Patient was nauseated and vomiting earlier today last vomited at 4:30. Nausea is improved with Zofran. Patient denies any other complaints. She reports she was on antihypertensive medication for some time but her blood pressure remained stable and when she ran out of the medication she did not seem to need more blood pressure medicine. She recently started back to work and had to cut back on her exercise. She has gained weight. Blood pressure. Weight has gone up.   Past Medical History  Diagnosis Date  . Hypertension     echo 03/2012 with moderate LVH, but normal EF and no evidence of diastolic dysfunction  . Edema leg   . Dizziness   . Migraine headache   . Asthma   . Palpitation   . Endometriosis   . 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 Obesity   . Cough due to ACE inhibitor     Patient Active Problem List   Diagnosis Date Noted  . Hot flashes 10/05/2015  . Elevated CK 02/12/2013  . Pelvic pain in female 07/10/2012  . Bilateral leg edema 04/09/2012  . 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 Hypertension     Past Surgical History  Procedure Laterality Date  . Tonsillectomy    . Tubal ligation    . Wisdom tooth extraction    . Laparoscopic supracervical hysterectomy  09/27/2010    For endometriosis. Ovaries anf tubes are in place.    Current Outpatient Rx  Name  Route  Sig  Dispense  Refill  . escitalopram (LEXAPRO) 20 MG tablet   Oral   Take 1 tablet (20 mg  total) by mouth daily.   31 tablet   12   . estradiol (VIVELLE-DOT) 0.025 MG/24HR   Transdermal   Place 1 patch onto the skin 2 (two) times a week.   8 patch   12   . multivitamin-iron-minerals-folic acid (CENTRUM) chewable tablet   Oral   Chew 1 tablet by mouth daily.             Allergies Ace inhibitors and Latex  Family History  Problem Relation Age of Onset  . COPD Mother   . Hypotension Mother   . Colon cancer Father   . Emphysema Father   . Cancer Father     colon  . Hyperlipidemia Sister   . Heart failure Paternal Uncle   . Heart attack Cousin 32  . Heart disease Cousin     heart attack    Social History Social History  Substance Use Topics  . Smoking status: Never Smoker   . Smokeless tobacco: Never Used  . Alcohol Use: No    Review of Systems Constitutional: No fever/chills Eyes: No visual changes. ENT: No sore throat. Cardiovascular: Denies chest pain. Respiratory: Denies shortness of breath. Gastrointestinal: No abdominal pain.  No nausea, no vomiting.  No diarrhea.  No constipation. Genitourinary: Negative for dysuria. Musculoskeletal: Negative for back pain. Skin: Negative for rash. Neurological: Negative for headaches, focal weakness or numbness.  10-point  ROS otherwise negative.  ____________________________________________   PHYSICAL EXAM:  VITAL SIGNS: ED Triage Vitals  Enc Vitals Group     BP 12/31/15 1827 164/112 mmHg     Pulse Rate 12/31/15 1827 83     Resp 12/31/15 1827 18     Temp 12/31/15 1827 98.4 F (36.9 C)     Temp Source 12/31/15 1827 Oral     SpO2 12/31/15 1827 97 %     Weight 12/31/15 1827 209 lb (94.802 kg)     Height 12/31/15 1827 5' 6"  (1.676 m)     Head Cir --      Peak Flow --      Pain Score 12/31/15 1828 7     Pain Loc --      Pain Edu? --      Excl. in [place]? --     Constitutional: Alert and oriented. Well appearing and in no acute distress. Eyes: Conjunctivae are normal. PERRL. EOMI. Head:  Atraumatic. Nose: No congestion/rhinnorhea. Mouth/Throat: Mucous membranes are moist.  Oropharynx non-erythematous. Neck: No stridor. Cardiovascular: Normal rate, regular rhythm. Grossly normal heart sounds.  Good peripheral circulation. Respiratory: Normal respiratory effort.  No retractions. Lungs CTAB. Gastrointestinal: Soft and nontender. No distention. No abdominal bruits. No CVA tenderness. Musculoskeletal: No lower extremity tenderness nor edema.  No joint effusions. Neurologic:  Normal speech and language. No gross focal neurologic deficits are appreciated. No gait instability. Skin:  Skin is warm, dry and intact. No rash noted. Psychiatric: Mood and affect are normal. Speech and behavior are normal.  ____________________________________________   LABS (all labs ordered are listed, but only abnormal results are displayed)  Labs Reviewed  COMPREHENSIVE METABOLIC PANEL - Abnormal; Notable for the following:    Potassium 3.4 (*)    Glucose, Bld 110 (*)    Total Protein 8.4 (*)    All other components within normal limits  CBC - Abnormal; Notable for the following:    WBC 11.8 (*)    RBC 5.35 (*)    MCH 25.6 (*)    MCHC 31.8 (*)    RDW 14.9 (*)    All other components within normal limits  URINALYSIS COMPLETEWITH MICROSCOPIC ([facility] ONLY) - Abnormal; Notable for the following:    Color, Urine YELLOW (*)    APPearance HAZY (*)    Protein, ur 100 (*)    Squamous Epithelial / LPF 6-30 (*)    All other components within normal limits  TROPONIN I - Abnormal; Notable for the following:    Troponin I 0.10 (*)    All other components within normal limits  LIPASE, BLOOD  POC URINE PREG, ED  POCT PREGNANCY, URINE   ____________________________________________  EKG  EKG read and interpreted by me shows normal sinus rhythm at 71 normal axis she has flipped T waves inferiorly and laterally which perhaps is slightly worse than  previously. ____________________________________________  RADIOLOGY  Chest x-ray read by radiology as no acute disease ____________________________________________   PROCEDURES   ____________________________________________   INITIAL IMPRESSION / ASSESSMENT AND PLAN / ED COURSE  Pertinent labs & imaging results that were available during my care of the patient were reviewed by me and considered in my medical decision making (see chart for details).   ____________________________________________   FINAL CLINICAL IMPRESSION(S) / ED DIAGNOSES  Final diagnoses:  Nausea  Elevated troponin      Nena Polio, MD 12/31/15 2159

## 2016-01-01 ENCOUNTER — Observation Stay
Admit: 2016-01-01 | Discharge: 2016-01-01 | Disposition: A | Discharge status: Home / Self Care | Payer: BLUE CROSS/BLUE SHIELD | Attending: Internal Medicine | Admitting: Internal Medicine

## 2016-01-01 DIAGNOSIS — R778 Other specified abnormalities of plasma proteins: Secondary | ICD-10-CM | POA: Diagnosis present

## 2016-01-01 DIAGNOSIS — R7989 Other specified abnormal findings of blood chemistry: Secondary | ICD-10-CM | POA: Diagnosis present

## 2016-01-01 LAB — TROPONIN I: Troponin I: 0.16 ng/mL — ABNORMAL HIGH (ref <0.031)

## 2016-01-01 MED ORDER — CENTRUM PO CHEW: 1.0000 | CHEWABLE_TABLET | Freq: Every day | ORAL | Status: DC

## 2016-01-01 MED ORDER — AMLODIPINE BESYLATE 10 MG PO TABS: 10.0000 mg | ORAL_TABLET | Freq: Every day | ORAL | Status: DC

## 2016-01-01 MED ORDER — ADULT MULTIVITAMIN W/MINERALS CH
1.0000 | ORAL_TABLET | Freq: Every day | ORAL | Status: DC
  Administered 2016-01-01: 1 via ORAL
  Filled 2016-01-01: qty 1

## 2016-01-01 MED ORDER — AMLODIPINE BESYLATE 10 MG PO TABS
10.0000 mg | ORAL_TABLET | Freq: Every day | ORAL | Status: DC
  Administered 2016-01-01: 10 mg via ORAL
  Filled 2016-01-01: qty 1

## 2016-01-01 MED ORDER — ESCITALOPRAM OXALATE 10 MG PO TABS
20.0000 mg | ORAL_TABLET | Freq: Every day | ORAL | Status: DC
  Administered 2016-01-01: 20 mg via ORAL
  Filled 2016-01-01: qty 2

## 2016-01-01 MED ORDER — METOPROLOL SUCCINATE ER 25 MG PO TB24
25.0000 mg | ORAL_TABLET | Freq: Every day | ORAL | Status: DC
  Administered 2016-01-01: 25 mg via ORAL
  Filled 2016-01-01: qty 1

## 2016-01-01 MED ORDER — METOPROLOL SUCCINATE ER 25 MG PO TB24: 25.0000 mg | ORAL_TABLET | Freq: Every day | ORAL | Status: DC

## 2016-01-01 MED ORDER — ASPIRIN 81 MG PO TBEC: 81.0000 mg | DELAYED_RELEASE_TABLET | Freq: Every day | ORAL | Status: DC

## 2016-01-01 NOTE — Progress Notes (Addendum)
Patient admitted to room 235 with a diagnosis of Hypertension urgency. Patient is alert and oriented x 4. Came in with a complain of severe headache. Oxycodone 5 mg PRN  Administered and will re-assess in an hour. Patient had emesis of undigested food (moderate amount). Zofran 4 mg IV was administered. Patient verbalized relief of the nausea. Oriented to her room, staff, call bell/ascom. Telemetry box verified by two nurses. Skin assessment done with Andria Meuse RN, no skin condition of concern noted. Fall Neurosurgeon. Will continue to monitor.

## 2016-01-01 NOTE — Discharge Summary (Signed)
[place] [facility] [place] NAME: Amanda Fields    MR#:  [id number]  DATE OF BIRTH:  1976-06-12  DATE OF ADMISSION:  12/31/2015 ADMITTING PHYSICIAN: Lytle Butte, MD  DATE OF DISCHARGE: 01/01/16  PRIMARY CARE PHYSICIAN: DR Ancil Boozer, MD    ADMISSION DIAGNOSIS:  Nausea [R11.0] Elevated troponin [R79.89]  DISCHARGE DIAGNOSIS:  Malignant HTN Nausea,vomiting and headache-resolved  SECONDARY DIAGNOSIS:   Past Medical History  Diagnosis Date  . Hypertension     echo 03/2012 with moderate LVH, but normal EF and no evidence of diastolic dysfunction  . Edema leg   . Dizziness   . Migraine headache   . Asthma   . Palpitation   . Endometriosis   . 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 Obesity   . Cough due to ACE inhibitor     HOSPITAL COURSE:  40 year old African American female history of essential tension on the medications nausea vomiting  1. Hypertensive urgency:  -bp much improved -pt feels better -will start po low dose toprol and amlodipine -pt will keep log of BP at home and f/u with Dr Ancil Boozer  2. Elevated troponin:  No cp or sob Leak likely due to elevated BP Cont asa Will get echo of the heart.  3. Anxiety/depression not otherwise specified: Lexapro  4. Venous thromboembolic embolism prophylactic: Heparin subcutaneous  D/c later home if bp remains stable CONSULTS OBTAINED:     DRUG ALLERGIES:   Allergies  Allergen Reactions  . Ace Inhibitors Cough  . Latex Itching    gloves    DISCHARGE MEDICATIONS:   Current Discharge Medication List    START taking these medications   Details  amLODipine (NORVASC) 10 MG tablet Take 1 tablet (10 mg total) by mouth daily. Qty: 30 tablet, Refills: 3    aspirin EC 81 MG EC tablet Take 1 tablet (81 mg total) by mouth daily. Qty: 30 tablet, Refills: 0    metoprolol succinate (TOPROL-XL) 25 MG 24 hr tablet Take 1 tablet (25 mg total) by mouth daily. Qty: 30 tablet, Refills: 3      CONTINUE these medications which have NOT CHANGED   Details  escitalopram (LEXAPRO) 20 MG tablet Take 1 tablet (20 mg total) by mouth daily. Qty: 31 tablet, Refills: 12    estradiol (VIVELLE-DOT) 0.025 MG/24HR Place 1 patch onto the skin 2 (two) times a week. Qty: 8 patch, Refills: 12   Associated Diagnoses: Menopause    multivitamin-iron-minerals-folic acid (CENTRUM) chewable tablet Chew 1 tablet by mouth daily.          If you experience worsening of your admission symptoms, develop shortness of breath, life threatening emergency, suicidal or homicidal thoughts you must seek medical attention immediately by calling 911 or calling your MD immediately  if symptoms less severe.  You Must read complete instructions/literature along with all the possible adverse reactions/side effects for all the Medicines you take and that have been prescribed to you. Take any new Medicines after you have completely understood and accept all the possible adverse reactions/side effects.   Please note  You were cared for by a hospitalist during your hospital stay. If you have any questions about your discharge medications or the care you received while you were in the hospital after you are discharged, you can call the unit and asked to speak with the hospitalist on call if the hospitalist that took care of you is not available. Once you are discharged, your primary care physician  will handle any further medical issues. Please note that NO REFILLS for any discharge medications will be authorized once you are discharged, as it is imperative that you return to your primary care physician (or establish a relationship with a primary care physician if you do not have one) for your aftercare needs so that they can reassess your need for medications and monitor your lab values. Today   SUBJECTIVE  Feels better. Reports not taking BP meds for >10 months   VITAL SIGNS:  Blood pressure 118/73, pulse 89,  temperature 98.6 F (37 C), temperature source Oral, resp. rate 16, height 5' 6"  (1.676 m), weight 92.806 kg (204 lb 9.6 oz), SpO2 97 %.  I/O:   Intake/Output Summary (Last 24 hours) at 01/01/16 0730 Last data filed at 01/01/16 0446  Gross per 24 hour  Intake      0 ml  Output      0 ml  Net      0 ml    PHYSICAL EXAMINATION:  GENERAL:  40 y.o.-year-old patient lying in the bed with no acute distress.  EYES: Pupils equal, round, reactive to light and accommodation. No scleral icterus. Extraocular muscles intact.  HEENT: Head atraumatic, normocephalic. Oropharynx and nasopharynx clear.  NECK:  Supple, no jugular venous distention. No thyroid enlargement, no tenderness.  LUNGS: Normal breath sounds bilaterally, no wheezing, rales,rhonchi or crepitation. No use of accessory muscles of respiration.  CARDIOVASCULAR: S1, S2 normal. No murmurs, rubs, or gallops.  ABDOMEN: Soft, non-tender, non-distended. Bowel sounds present. No organomegaly or mass.  EXTREMITIES: No pedal edema, cyanosis, or clubbing.  NEUROLOGIC: Cranial nerves II through XII are intact. Muscle strength 5/5 in all extremities. Sensation intact. Gait not checked.  PSYCHIATRIC: The patient is alert and oriented x 3.  SKIN: No obvious rash, lesion, or ulcer.   DATA REVIEW:   CBC   Recent Labs Lab 12/31/15 1857  WBC 11.8*  HGB 13.7  HCT 43.0  PLT 270    Chemistries   Recent Labs Lab 12/31/15 1857  NA 141  K 3.4*  CL 104  CO2 30  GLUCOSE 110*  BUN 10  CREATININE 0.93  CALCIUM 9.1  AST 26  ALT 21  ALKPHOS 67  BILITOT 0.4    Microbiology Results   No results found for this or any previous visit (from the past 240 hour(s)).  RADIOLOGY:  Dg Chest 2 View  12/31/2015  CLINICAL DATA:  Acute onset of high blood pressure. Headache and burning sensation in the chest. Nausea and vomiting. Initial encounter. EXAM: CHEST  2 VIEW COMPARISON:  Chest radiograph from 07/17/2012 FINDINGS: The lungs are  well-aerated and clear. There is no evidence of focal opacification, pleural effusion or pneumothorax. The heart is normal in size; the mediastinal contour is within normal limits. No acute osseous abnormalities are seen. IMPRESSION: No acute cardiopulmonary process seen. Electronically Signed   By: Garald Balding M.D.   On: 12/31/2015 21:27     Management plans discussed with the patient, family and they are in agreement.  CODE STATUS:     Code Status Orders        Start     Ordered   12/31/15 2202  Full code   Continuous     12/31/15 2204    Code Status History    Date Active Date Inactive Code Status Order ID Comments User Context   This patient has a current code status but no historical code status.  TOTAL TIME TAKING CARE OF THIS PATIENT:40 minutes.    Briah Nary M.D on 01/01/2016 at 7:30 AM  Between 7am to 6pm - Pager - [phone] After 6pm go to [email or web address] - password [email or web address] [facility]  [facility]  Office  [phone]  CC: Primary care physician;[name],MD

## 2016-01-01 NOTE — Discharge Instructions (Signed)
Low sat diet KEEP LOG OF YOUR BP at home

## 2016-01-01 NOTE — Progress Notes (Signed)
Patient received discharge instructions, pt verbalized understanding. Educated patient on the importance of keeping a BP log at home. Patient stated she has a blood pressure cuff at home. Provided patient with hypertension educational material as well.  IV was removed with no signs of infection. Dressing clean, dry intact. No skin tears or wounds present. Prescription was sent to pharmacy of choice. Patient was escorted out with staff member via wheelchair via private auto. No further needs from care management team.

## 2016-01-01 NOTE — Progress Notes (Signed)
ECHO completed at bedside. Per Dr. Posey Pronto okay for patient to discharge home after ECHO. MD is aware of Troponin levels prior to discharge. Per MD patient will follow up with cardiology. Per Dr. Burt Knack patient will be seen in his [place] location after discharge. Notified patient of plans. Patient is okay with plans. Patient has no chest pain, no SOB. Patient is requesting to go home this am. Vital signs are stable.  Filed Vitals:   01/01/16 0916 01/01/16 1025  BP: 122/63 116/69  Pulse: 79 91  Temp:    Resp:     Will discharge patient home.

## 2016-01-03 ENCOUNTER — Telehealth: Payer: Self-pay

## 2016-01-03 NOTE — Telephone Encounter (Signed)
Patient contacted regarding discharge from [facility] on 01/11/16.  Patient understands to follow up with Christell Faith, PA on 01/11/16 at 9:30 at [facility]. Patient understands discharge instructions? yes Patient understands medications and regiment? yes Patient understands to bring all medications to this visit? yes

## 2016-01-03 NOTE — Telephone Encounter (Signed)
-----   Message from Blain Pais sent at 01/03/2016 11:02 AM EST ----- Regarding: tcm/ph 01/11/2016 Christell Faith, PA,9:30

## 2016-01-04 ENCOUNTER — Ambulatory Visit: Payer: Self-pay | Admitting: Family Medicine

## 2016-01-04 ENCOUNTER — Encounter: Payer: Self-pay | Admitting: Physician Assistant

## 2016-01-04 ENCOUNTER — Ambulatory Visit (INDEPENDENT_AMBULATORY_CARE_PROVIDER_SITE_OTHER): Payer: BLUE CROSS/BLUE SHIELD | Admitting: Family Medicine

## 2016-01-04 ENCOUNTER — Encounter: Payer: Self-pay | Admitting: Family Medicine

## 2016-01-04 ENCOUNTER — Ambulatory Visit (INDEPENDENT_AMBULATORY_CARE_PROVIDER_SITE_OTHER): Payer: BLUE CROSS/BLUE SHIELD | Admitting: Physician Assistant

## 2016-01-04 VITALS — BP 147/97 | HR 78 | Resp 18 | Ht 66.75 in | Wt 209.0 lb

## 2016-01-04 VITALS — BP 123/78 | HR 91 | Temp 98.3°F | Resp 18 | Ht 66.0 in | Wt 209.0 lb

## 2016-01-04 DIAGNOSIS — R809 Proteinuria, unspecified: Secondary | ICD-10-CM | POA: Diagnosis not present

## 2016-01-04 DIAGNOSIS — R600 Localized edema: Secondary | ICD-10-CM

## 2016-01-04 DIAGNOSIS — E876 Hypokalemia: Secondary | ICD-10-CM

## 2016-01-04 DIAGNOSIS — R778 Other specified abnormalities of plasma proteins: Secondary | ICD-10-CM | POA: Diagnosis not present

## 2016-01-04 DIAGNOSIS — D72829 Elevated white blood cell count, unspecified: Secondary | ICD-10-CM | POA: Diagnosis not present

## 2016-01-04 DIAGNOSIS — N898 Other specified noninflammatory disorders of vagina: Secondary | ICD-10-CM

## 2016-01-04 DIAGNOSIS — Z113 Encounter for screening for infections with a predominantly sexual mode of transmission: Secondary | ICD-10-CM | POA: Diagnosis not present

## 2016-01-04 DIAGNOSIS — I10 Essential (primary) hypertension: Secondary | ICD-10-CM | POA: Diagnosis not present

## 2016-01-04 NOTE — Patient Instructions (Signed)
Bacterial Vaginosis Bacterial vaginosis is a vaginal infection that occurs when the normal balance of bacteria in the vagina is disrupted. It results from an overgrowth of certain bacteria. This is the most common vaginal infection in women of childbearing age. Treatment is important to prevent complications, especially in pregnant women, as it can cause a premature delivery. CAUSES  Bacterial vaginosis is caused by an increase in harmful bacteria that are normally present in smaller amounts in the vagina. Several different kinds of bacteria can cause bacterial vaginosis. However, the reason that the condition develops is not fully understood. RISK FACTORS Certain activities or behaviors can put you at an increased risk of developing bacterial vaginosis, including:  Having a new sex partner or multiple sex partners.  Douching.  Using an intrauterine device (IUD) for contraception. Women do not get bacterial vaginosis from toilet seats, bedding, swimming pools, or contact with objects around them. SIGNS AND SYMPTOMS  Some women with bacterial vaginosis have no signs or symptoms. Common symptoms include:  Grey vaginal discharge.  A fishlike odor with discharge, especially after sexual intercourse.  Itching or burning of the vagina and vulva.  Burning or pain with urination. DIAGNOSIS  Your health care provider will take a medical history and examine the vagina for signs of bacterial vaginosis. A sample of vaginal fluid may be taken. Your health care provider will look at this sample under a microscope to check for bacteria and abnormal cells. A vaginal pH test may also be done.  TREATMENT  Bacterial vaginosis may be treated with antibiotic medicines. These may be given in the form of a pill or a vaginal cream. A second round of antibiotics may be prescribed if the condition comes back after treatment. Because bacterial vaginosis increases your risk for sexually transmitted diseases, getting  treated can help reduce your risk for chlamydia, gonorrhea, HIV, and herpes. HOME CARE INSTRUCTIONS   Only take over-the-counter or prescription medicines as directed by your health care provider.  If antibiotic medicine was prescribed, take it as directed. Make sure you finish it even if you start to feel better.  Tell all sexual partners that you have a vaginal infection. They should see their health care provider and be treated if they have problems, such as a mild rash or itching.  During treatment, it is important that you follow these instructions:  Avoid sexual activity or use condoms correctly.  Do not douche.  Avoid alcohol as directed by your health care provider.  Avoid breastfeeding as directed by your health care provider. SEEK MEDICAL CARE IF:   Your symptoms are not improving after 3 days of treatment.  You have increased discharge or pain.  You have a fever. MAKE SURE YOU:   Understand these instructions.  Will watch your condition.  Will get help right away if you are not doing well or get worse. FOR MORE INFORMATION  Centers for Disease Control and Prevention, Division of STD Prevention: AppraiserFraud.fi American Sexual Health Association (ASHA): www.ashastd.org    This information is not intended to replace advice given to you by your health care provider. Make sure you discuss any questions you have with your health care provider.   Document Released: 12/04/2005 Document Revised: 12/25/2014 Document Reviewed: 07/16/2013 Elsevier Interactive Patient Education 2016 [place] DASH stands for "Dietary Approaches to Stop Hypertension." The DASH eating plan is a healthy eating plan that has been shown to reduce high blood pressure (hypertension). Additional health benefits may include reducing the  risk of type 2 diabetes mellitus, heart disease, and stroke. The DASH eating plan may also help with weight loss. WHAT DO I NEED TO KNOW ABOUT  THE DASH EATING PLAN? For the DASH eating plan, you will follow these general guidelines:  Choose foods with a percent daily value for sodium of less than 5% (as listed on the food label).  Use salt-free seasonings or herbs instead of table salt or sea salt.  Check with your health care provider or pharmacist before using salt substitutes.  Eat lower-sodium products, often labeled as "lower sodium" or "no salt added."  Eat fresh foods.  Eat more vegetables, fruits, and low-fat dairy products.  Choose whole grains. Look for the word "whole" as the first word in the ingredient list.  Choose fish and skinless chicken or [place] more often than red meat. Limit fish, poultry, and meat to 6 oz (170 g) each day.  Limit sweets, desserts, sugars, and sugary drinks.  Choose heart-healthy fats.  Limit cheese to 1 oz (28 g) per day.  Eat more home-cooked food and less restaurant, buffet, and fast food.  Limit fried foods.  Cook foods using methods other than frying.  Limit canned vegetables. If you do use them, rinse them well to decrease the sodium.  When eating at a restaurant, ask that your food be prepared with less salt, or no salt if possible. WHAT FOODS CAN I EAT? Seek help from a dietitian for individual calorie needs. Grains Whole grain or whole wheat bread. Brown rice. Whole grain or whole wheat pasta. Quinoa, bulgur, and whole grain cereals. Low-sodium cereals. Corn or whole wheat flour tortillas. Whole grain cornbread. Whole grain crackers. Low-sodium crackers. Vegetables Fresh or frozen vegetables (raw, steamed, roasted, or grilled). Low-sodium or reduced-sodium tomato and vegetable juices. Low-sodium or reduced-sodium tomato sauce and paste. Low-sodium or reduced-sodium canned vegetables.  Fruits All fresh, canned (in natural juice), or frozen fruits. Meat and Other Protein Products Ground beef (85% or leaner), grass-fed beef, or beef trimmed of fat. Skinless chicken or  [place]. Ground chicken or [place]. Pork trimmed of fat. All fish and seafood. Eggs. Dried beans, peas, or lentils. Unsalted nuts and seeds. Unsalted canned beans. Dairy Low-fat dairy products, such as skim or 1% milk, 2% or reduced-fat cheeses, low-fat ricotta or cottage cheese, or plain low-fat yogurt. Low-sodium or reduced-sodium cheeses. Fats and Oils Tub margarines without trans fats. Light or reduced-fat mayonnaise and salad dressings (reduced sodium). Avocado. Safflower, olive, or canola oils. Natural peanut or almond butter. Other Unsalted popcorn and pretzels. The items listed above may not be a complete list of recommended foods or beverages. Contact your dietitian for more options. WHAT FOODS ARE NOT RECOMMENDED? Grains White bread. White pasta. White rice. Refined cornbread. Bagels and croissants. Crackers that contain trans fat. Vegetables Creamed or fried vegetables. Vegetables in a cheese sauce. Regular canned vegetables. Regular canned tomato sauce and paste. Regular tomato and vegetable juices. Fruits Dried fruits. Canned fruit in light or heavy syrup. Fruit juice. Meat and Other Protein Products Fatty cuts of meat. Ribs, chicken wings, bacon, sausage, bologna, salami, chitterlings, fatback, hot dogs, bratwurst, and packaged luncheon meats. Salted nuts and seeds. Canned beans with salt. Dairy Whole or 2% milk, cream, half-and-half, and cream cheese. Whole-fat or sweetened yogurt. Full-fat cheeses or blue cheese. Nondairy creamers and whipped toppings. Processed cheese, cheese spreads, or cheese curds. Condiments Onion and garlic salt, seasoned salt, table salt, and sea salt. Canned and packaged gravies. Worcestershire sauce. Tartar sauce.  Barbecue sauce. Teriyaki sauce. Soy sauce, including reduced sodium. Steak sauce. Fish sauce. Oyster sauce. Cocktail sauce. Horseradish. Ketchup and mustard. Meat flavorings and tenderizers. Bouillon cubes. Hot sauce. Tabasco sauce. Marinades.  Taco seasonings. Relishes. Fats and Oils Butter, stick margarine, lard, shortening, ghee, and bacon fat. Coconut, palm kernel, or palm oils. Regular salad dressings. Other Pickles and olives. Salted popcorn and pretzels. The items listed above may not be a complete list of foods and beverages to avoid. Contact your dietitian for more information. WHERE CAN I FIND MORE INFORMATION? National Heart, Lung, and Blood Institute: travelstabloid.com   This information is not intended to replace advice given to you by your health care provider. Make sure you discuss any questions you have with your health care provider.   Document Released: 11/23/2011 Document Revised: 12/25/2014 Document Reviewed: 10/08/2013 Elsevier Interactive Patient Education [employer].

## 2016-01-04 NOTE — Progress Notes (Signed)
Patient ID: Amanda Fields, female   DOB: 04/23/1976, 40 y.o.   MRN: [id number] History:  Amanda Fields is a 40 y.o. [id number] who presents to clinic today for followup after hospital admission and eval of vaginal discharge.  The discharge started yesterday.  It is described as thin and clear with an odor.  She notes the odor especially with urination.  She does desire STD testing.  She no longer has periods.  Last IC was this morning.  She was admitted to the hospital with high blood pressure, nausea, vomiting, sweats.  Her BP continues to be elevated and she will see cardiology one week from today.   She is on a baby aspirin daily now.  She presently denies HA, vision change, N/V, fever, weakness.    The following portions of the patient's history were reviewed and updated as appropriate: allergies, current medications, past family history, past medical history, past social history, past surgical history and problem list.  Review of Systems:  Pertinent items are noted in HPI.  All other systems are negative.   Objective:  Physical Exam BP 147/97 mmHg  Pulse 78  Resp 18  Ht 5' 6.75" (1.695 m)  Wt 209 lb (94.802 kg)  BMI 33.00 kg/m2 GENERAL: Well-developed, well-nourished female in no acute distress.  HEENT: Normocephalic, atraumatic.  RESPIRATORY: Normal rate. Clear to auscultation bilaterally.  CARDIOVASCULAR: Regular rate and rhythm with no adventitious sounds.  ABDOMEN: Soft, nontender, nondistended. No organomegaly.  PELVIC: Normal external female genitalia. Vagina is pink and rugated.  Mod amt of thin, milky discharge. EXTREMITIES: No cyanosis, clubbing, or edema NEURO: CN II-XII grossly intact PSYCH: normal mood, normal behavior   Labs and Imaging   Assessment & Plan:  Assessment: Vaginal discharge Uncontrolled HTN  Plans: Stop Vivelle dot immediately.  Discuss use of HRT with cardiologist prior to resuming Wet prep pending along with GC/Chlamydia.  Will notify patient  if rx needed.  Return PRN or for annual exam  Paticia Stack, PA-C 01/04/2016 9:02 AM

## 2016-01-04 NOTE — Progress Notes (Signed)
Name: Amanda Fields   MRN: [id number]    DOB: Sep 24, 1976   Date:01/04/2016       Progress Note  Subjective  Chief Complaint  Chief Complaint  Patient presents with  . Establish Care    NP    Hypertension This is a chronic problem. The problem is controlled. Pertinent negatives include no blurred vision, chest pain, headaches, malaise/fatigue, palpitations or shortness of breath. Past treatments include beta blockers and calcium channel blockers. There is no history of kidney disease, CAD/MI or CVA.  Seen in the ER on 12/31/15 with elevated BP, was evaluated, discharged to follow up with PCP. Lab work from ER visit reviewed in detail including abnormalities as follows; Elevated WBC count, low potassium, elevated total protein, and proteinuria. These will be repeated today  Pt. Is here to establish care.  No previous PCP but was being seen by Gynecology for yearly physical. Labs and notes from Gynecology reviewed.  Past Medical History  Diagnosis Date  . Hypertension     echo 03/2012 with moderate LVH, but normal EF and no evidence of diastolic dysfunction  . Edema leg   . Dizziness   . Migraine headache   . Asthma   . Palpitation   . Endometriosis   . 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 Obesity   . Cough due to ACE inhibitor     Past Surgical History  Procedure Laterality Date  . Tonsillectomy    . Wisdom tooth extraction    . Laparoscopic supracervical hysterectomy  09/27/2010    For endometriosis. Ovaries anf tubes are in place.  . Tubal ligation  2006    Family History  Problem Relation Age of Onset  . COPD Mother   . Hypotension Mother   . Colon cancer Father   . Emphysema Father   . Cancer Father     colon  . Hyperlipidemia Sister   . Heart failure Paternal Uncle   . Heart attack Cousin 32  . Heart disease Cousin     heart attack    Social History   Social History  . Marital Status: Single    Spouse Name: N/A  . Number of Children: 2  . Years of Education:  N/A   Occupational History  . call center    Social History Main Topics  . Smoking status: Never Smoker   . Smokeless tobacco: Never Used  . Alcohol Use: No     Comment: social  . Drug Use: No  . Sexual Activity:    Partners: Male    Birth Control/ Protection: Surgical   Other Topics Concern  . Not on file   Social History Narrative     Current outpatient prescriptions:  .  amLODipine (NORVASC) 10 MG tablet, Take 1 tablet (10 mg total) by mouth daily., Disp: 30 tablet, Rfl: 3 .  aspirin EC 81 MG EC tablet, Take 1 tablet (81 mg total) by mouth daily., Disp: 30 tablet, Rfl: 0 .  escitalopram (LEXAPRO) 20 MG tablet, Take 1 tablet (20 mg total) by mouth daily., Disp: 31 tablet, Rfl: 12 .  metoprolol succinate (TOPROL-XL) 25 MG 24 hr tablet, Take 1 tablet (25 mg total) by mouth daily., Disp: 30 tablet, Rfl: 3 .  multivitamin-iron-minerals-folic acid (CENTRUM) chewable tablet, Chew 1 tablet by mouth daily.  , Disp: , Rfl:  .  estradiol (VIVELLE-DOT) 0.025 MG/24HR, Place 1 patch onto the skin 2 (two) times a week. (Patient not taking: Reported on 01/04/2016), Disp: 8 patch, Rfl: 12 .  [  DISCONTINUED] fluticasone (FLONASE) 50 MCG/ACT nasal spray, Place 2 sprays into the nose as needed.  , Disp: , Rfl:  .  [DISCONTINUED] lisinopril-hydrochlorothiazide (PRINZIDE) 20-12.5 MG per tablet, Take 1 tablet by mouth daily., Disp: 30 tablet, Rfl: 6 .  [DISCONTINUED] torsemide (DEMADEX) 10 MG tablet, Take 10 mg by mouth daily.  , Disp: , Rfl:   Allergies  Allergen Reactions  . Ace Inhibitors Cough  . Latex Itching    gloves     Review of Systems  Constitutional: Negative for fever, chills, weight loss and malaise/fatigue.  Eyes: Negative for blurred vision.  Respiratory: Negative for shortness of breath.   Cardiovascular: Negative for chest pain and palpitations.  Gastrointestinal: Negative for abdominal pain, blood in stool and melena.  Genitourinary: Negative for dysuria, frequency,  hematuria and flank pain.  Neurological: Negative for dizziness and headaches.    Objective  Filed Vitals:   01/04/16 1501  BP: 123/78  Pulse: 91  Temp: 98.3 F (36.8 C)  TempSrc: Oral  Resp: 18  Height: _0  (1.676 m)  Weight: 209 lb (94.802 kg)  SpO2: 97%    Physical Exam  Constitutional: She is oriented to person, place, and time and well-developed, well-nourished, and in no distress.  HENT:  Head: Normocephalic and atraumatic.  Eyes: Pupils are equal, round, and reactive to light.  Neck: Neck supple.  Cardiovascular: Normal rate and regular rhythm.   Pulmonary/Chest: Effort normal and breath sounds normal.  Abdominal: Soft. Bowel sounds are normal.  Musculoskeletal: She exhibits edema (2+ pitting bilateral edema).  Neurological: She is alert and oriented to person, place, and time.  Skin: Skin is warm and dry.  Nursing note and vitals reviewed.   Recent Results (from the past 2160 hour(s))  Lipase, blood     Status: None   Collection Time: 12/31/15  6:57 PM  Result Value Ref Range   Lipase 23 11 - 51 U/L  Comprehensive metabolic panel     Status: Abnormal   Collection Time: 12/31/15  6:57 PM  Result Value Ref Range   Sodium 141 135 - 145 mmol/L   Potassium 3.4 (L) 3.5 - 5.1 mmol/L   Chloride 104 101 - 111 mmol/L   CO2 30 22 - 32 mmol/L   Glucose, Bld 110 (H) 65 - 99 mg/dL   BUN 10 6 - 20 mg/dL   Creatinine, Ser 0.93 0.44 - 1.00 mg/dL   Calcium 9.1 8.9 - 10.3 mg/dL   Total Protein 8.4 (H) 6.5 - 8.1 g/dL   Albumin 4.4 3.5 - 5.0 g/dL   AST 26 15 - 41 U/L   ALT 21 14 - 54 U/L   Alkaline Phosphatase 67 38 - 126 U/L   Total Bilirubin 0.4 0.3 - 1.2 mg/dL   GFR calc non Af Amer >60 >60 mL/min   GFR calc Af Amer >60 >60 mL/min    Comment: (NOTE) The eGFR has been calculated using the CKD EPI equation. This calculation has not been validated in all clinical situations. eGFR's persistently <60 mL/min signify possible Chronic Kidney Disease.    Anion gap 7 5 -  15  CBC     Status: Abnormal   Collection Time: 12/31/15  6:57 PM  Result Value Ref Range   WBC 11.8 (H) 3.6 - 11.0 K/uL   RBC 5.35 (H) 3.80 - 5.20 MIL/uL   Hemoglobin 13.7 12.0 - 16.0 g/dL   HCT 43.0 35.0 - 47.0 %   MCV 80.4 80.0 - 100.0 fL  MCH 25.6 (L) 26.0 - 34.0 pg   MCHC 31.8 (L) 32.0 - 36.0 g/dL   RDW 14.9 (H) 11.5 - 14.5 %   Platelets 270 150 - 440 K/uL  Troponin I     Status: Abnormal   Collection Time: 12/31/15  9:07 PM  Result Value Ref Range   Troponin I 0.10 (H) <0.031 ng/mL    Comment: READ BACK AND VERIFIED WITH DR.MALINDA  AT 2150 ON 12/31/15 RWW        PERSISTENTLY INCREASED TROPONIN VALUES IN THE RANGE OF 0.04-0.49 ng/mL CAN BE SEEN IN:       -UNSTABLE ANGINA       -CONGESTIVE HEART FAILURE       -MYOCARDITIS       -CHEST TRAUMA       -ARRYHTHMIAS       -LATE PRESENTING MYOCARDIAL INFARCTION       -COPD   CLINICAL FOLLOW-UP RECOMMENDED.   Urinalysis complete, with microscopic ([facility] only)     Status: Abnormal   Collection Time: 12/31/15  9:13 PM  Result Value Ref Range   Color, Urine YELLOW (A) YELLOW   APPearance HAZY (A) CLEAR   Glucose, UA NEGATIVE NEGATIVE mg/dL   Bilirubin Urine NEGATIVE NEGATIVE   Ketones, ur NEGATIVE NEGATIVE mg/dL   Specific Gravity, Urine 1.020 1.005 - 1.030   Hgb urine dipstick NEGATIVE NEGATIVE   pH 7.0 5.0 - 8.0   Protein, ur 100 (A) NEGATIVE mg/dL   Nitrite NEGATIVE NEGATIVE   Leukocytes, UA NEGATIVE NEGATIVE   RBC / HPF 0-5 0 - 5 RBC/hpf   WBC, UA 0-5 0 - 5 WBC/hpf   Bacteria, UA NONE SEEN NONE SEEN   Squamous Epithelial / LPF 6-30 (A) NONE SEEN   Mucous PRESENT   Pregnancy, urine POC     Status: None   Collection Time: 12/31/15  9:17 PM  Result Value Ref Range   Preg Test, Ur NEGATIVE NEGATIVE    Comment:        THE SENSITIVITY OF THIS METHODOLOGY IS >24 mIU/mL   Troponin I (q 6hr x 3)     Status: Abnormal   Collection Time: 01/01/16  6:34 AM  Result Value Ref Range   Troponin I 0.16 (H) <0.031 ng/mL     Comment: PREVIOUS RESULT CALLED DR Lolita Patella 0N 12/31/15 AT 2150PM BY TLB        PERSISTENTLY INCREASED TROPONIN VALUES IN THE RANGE OF 0.04-0.49 ng/mL CAN BE SEEN IN:       -UNSTABLE ANGINA       -CONGESTIVE HEART FAILURE       -MYOCARDITIS       -CHEST TRAUMA       -ARRYHTHMIAS       -LATE PRESENTING MYOCARDIAL INFARCTION       -COPD   CLINICAL FOLLOW-UP RECOMMENDED.      Assessment & Plan  1. Essential hypertension BP stable and controlled at today's office visit. Continue present antihypertensive therapy and recheck in one month.  2. Hypokalemia  - Comprehensive Metabolic Panel (CMET)  3. Proteinuria Repeat urinalysis for evaluation of protein detected in her urine. - Urinalysis  4. Leukocytosis  - CBC with Differential  5. Elevated total protein  - Comprehensive Metabolic Panel (CMET)  6. Bilateral leg edema  - Comprehensive Metabolic Panel (CMET)   Jaeveon Ashland [facility] 01/04/2016 4:09 PM

## 2016-01-05 ENCOUNTER — Telehealth: Payer: Self-pay | Admitting: *Deleted

## 2016-01-05 DIAGNOSIS — B9689 Other specified bacterial agents as the cause of diseases classified elsewhere: Secondary | ICD-10-CM

## 2016-01-05 DIAGNOSIS — N760 Acute vaginitis: Principal | ICD-10-CM

## 2016-01-05 LAB — COMPREHENSIVE METABOLIC PANEL
ALBUMIN: 4.3 g/dL (ref 3.5–5.5)
ALK PHOS: 76 IU/L (ref 39–117)
ALT: 21 IU/L (ref 0–32)
AST: 29 IU/L (ref 0–40)
Albumin/Globulin Ratio: 1.3 (ref 1.1–2.5)
BUN / CREAT RATIO: 15 (ref 9–23)
BUN: 13 mg/dL (ref 6–24)
CHLORIDE: 97 mmol/L (ref 96–106)
CO2: 27 mmol/L (ref 18–29)
Calcium: 9.5 mg/dL (ref 8.7–10.2)
Creatinine, Ser: 0.88 mg/dL (ref 0.57–1.00)
GFR calc Af Amer: 94 mL/min/{1.73_m2} (ref >59)
GFR calc non Af Amer: 82 mL/min/{1.73_m2} (ref >59)
GLOBULIN, TOTAL: 3.3 g/dL (ref 1.5–4.5)
GLUCOSE: 96 mg/dL (ref 65–99)
Potassium: 3.8 mmol/L (ref 3.5–5.2)
SODIUM: 140 mmol/L (ref 134–144)
Total Protein: 7.6 g/dL (ref 6.0–8.5)

## 2016-01-05 LAB — CBC WITH DIFFERENTIAL/PLATELET
BASOS ABS: 0.1 10*3/uL (ref 0.0–0.2)
Basos: 1 %
EOS (ABSOLUTE): 0.6 10*3/uL — AB (ref 0.0–0.4)
Eos: 8 %
HEMOGLOBIN: 13.7 g/dL (ref 11.1–15.9)
Hematocrit: 41.9 % (ref 34.0–46.6)
Immature Grans (Abs): 0 10*3/uL (ref 0.0–0.1)
Immature Granulocytes: 0 %
LYMPHS ABS: 2.9 10*3/uL (ref 0.7–3.1)
Lymphs: 38 %
MCH: 26.1 pg — AB (ref 26.6–33.0)
MCHC: 32.7 g/dL (ref 31.5–35.7)
MCV: 80 fL (ref 79–97)
MONOCYTES: 7 %
MONOS ABS: 0.6 10*3/uL (ref 0.1–0.9)
NEUTROS ABS: 3.5 10*3/uL (ref 1.4–7.0)
Neutrophils: 46 %
Platelets: 307 10*3/uL (ref 150–379)
RBC: 5.25 x10E6/uL (ref 3.77–5.28)
RDW: 15.5 % — ABNORMAL HIGH (ref 12.3–15.4)
WBC: 7.7 10*3/uL (ref 3.4–10.8)

## 2016-01-05 LAB — WET PREP, GENITAL
TRICH WET PREP: NONE SEEN
WBC, Wet Prep HPF POC: NONE SEEN
YEAST WET PREP: NONE SEEN

## 2016-01-05 LAB — URINALYSIS
Bilirubin, UA: NEGATIVE
GLUCOSE, UA: NEGATIVE
KETONES UA: NEGATIVE
LEUKOCYTES UA: NEGATIVE
Nitrite, UA: NEGATIVE
RBC UA: NEGATIVE
SPEC GRAV UA: 1.022 (ref 1.005–1.030)
Urobilinogen, Ur: 0.2 mg/dL (ref 0.2–1.0)
pH, UA: 6 (ref 5.0–7.5)

## 2016-01-05 LAB — GC/CHLAMYDIA PROBE AMP (~~LOC~~) NOT AT ARMC
Chlamydia: NEGATIVE
Neisseria Gonorrhea: NEGATIVE

## 2016-01-05 MED ORDER — METRONIDAZOLE 500 MG PO TABS: 500.0000 mg | ORAL_TABLET | Freq: Two times a day (BID) | ORAL | Status: DC

## 2016-01-05 NOTE — Telephone Encounter (Signed)
-----   Message from Paticia Stack, PA-C sent at 01/05/2016  8:02 AM EST ----- Please inform patient of bacterial vaginosis and send in rx for metronidazole 500 bid x 7.

## 2016-01-05 NOTE — Telephone Encounter (Signed)
I have sent in prescription for bacterial vaginosis, patient aware.

## 2016-01-10 ENCOUNTER — Encounter: Payer: Self-pay | Admitting: Physician Assistant

## 2016-01-10 NOTE — Progress Notes (Signed)
Cardiology Office Note Date:  01/11/2016  Patient ID:  Amanda Fields, Amanda Fields Apr 14, 1976, MRN [id number] PCP:  Keith Rake, MD  Cardiologist:  Dr. Martinique, MD    Chief Complaint: ED follow up  History of Present Illness: Amanda Fields is a 40 y.o. female with history of poorly controlled HTN and moderate LVH presents for hospital follow up. She was last seen by Dr. Martinique, MD on 05/21/2014. She previously had an echo on 04/10/2012 that showed normal LV systolic function with an EF of 60-65%, moderate LVH, mild concentric hypertrophy, no RWMA, LV diastolic function parameters were normal, left atrium was normal in size, RV systolic function was normal, PASP was normal. She had been off all antihypertensives for approximately one year when she was admitted to [facility] as above. She was originally scheduled to undergo routine outpatient procedure. In pre-op her BP was found to be 160-170/111 and did not respond to antihypertensives administered by anesthesiologist. She was advised to follow up as an outpatient later that day with [facility]. She proceed to lunch with a friend, became nauseated, vomited, and developed a HA. This prompted her to come to the ED. In the ED troponin was checked and found to be mildly elevated at 0.10-->0.16. She denied any chest pain. Echo showed EF 55-60%. BP improved upon admission. BP improved with antihypertensives. She was discharged on metoprolol and amlodipine. Amlodipine has led to lower extremity swelling that was not present prior to starting this medication. No further headache. No chest pain. BP has been well controlled at home. She does note some generalized fatigue.     Past Medical History  Diagnosis Date  . Hypertension     a. echo 03/2012 EF 60-65%, moderate LVH, nl LV diastolic fxn, nl PASP; b. echo 12/2015: EF 55-60%  . Edema leg   . Dizziness   . Migraine headache   . Asthma   . Palpitation   . Endometriosis   . 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 Obesity   .  Cough due to ACE inhibitor     Past Surgical History  Procedure Laterality Date  . Tonsillectomy    . Wisdom tooth extraction    . Laparoscopic supracervical hysterectomy  09/27/2010    For endometriosis. Ovaries anf tubes are in place.  . Tubal ligation  2006    Current Outpatient Prescriptions  Medication Sig Dispense Refill  . aspirin EC 81 MG EC tablet Take 1 tablet (81 mg total) by mouth daily. 30 tablet 0  . escitalopram (LEXAPRO) 20 MG tablet Take 1 tablet (20 mg total) by mouth daily. 31 tablet 12  . metoprolol succinate (TOPROL-XL) 50 MG 24 hr tablet Take 1 tablet (50 mg total) by mouth daily. 30 tablet 3  . metroNIDAZOLE (FLAGYL) 500 MG tablet Take 1 tablet (500 mg total) by mouth 2 (two) times daily. 14 tablet 0  . multivitamin-iron-minerals-folic acid (CENTRUM) chewable tablet Chew 1 tablet by mouth daily.      Marland Kitchen estradiol (VIVELLE-DOT) 0.025 MG/24HR Place 1 patch onto the skin 2 (two) times a week. (Patient not taking: Reported on 01/11/2016) 8 patch 12  . hydrochlorothiazide (MICROZIDE) 12.5 MG capsule Take 1 capsule (12.5 mg total) by mouth daily. 30 capsule 3  . [DISCONTINUED] fluticasone (FLONASE) 50 MCG/ACT nasal spray Place 2 sprays into the nose as needed.      . [DISCONTINUED] lisinopril-hydrochlorothiazide (PRINZIDE) 20-12.5 MG per tablet Take 1 tablet by mouth daily. 30 tablet 6  . [DISCONTINUED] torsemide ([employer])  10 MG tablet Take 10 mg by mouth daily.       No current facility-administered medications for this visit.    Allergies:   Ace inhibitors and Latex   Social History:  The patient  reports that she has never smoked. She has never used smokeless tobacco. She reports that she does not drink alcohol or use illicit drugs.   Family History:  The patient's family history includes COPD in her mother; Cancer in her father; Colon cancer in her father; Emphysema in her father; Heart attack (age of onset: 34) in her cousin; Heart disease in her cousin; Heart  failure in her paternal uncle; Hyperlipidemia in her sister; Hypotension in her mother.  ROS:   Review of Systems  Constitutional: Positive for malaise/fatigue. Negative for chills, weight loss and diaphoresis.  HENT: Negative for congestion.   Eyes: Negative for discharge and redness.  Respiratory: Negative for cough, sputum production, shortness of breath and wheezing.   Cardiovascular: Positive for leg swelling. Negative for chest pain, palpitations, orthopnea, claudication and PND.  Gastrointestinal: Negative for nausea, vomiting and abdominal pain.  Musculoskeletal: Negative for falls.  Skin: Negative for rash.  Neurological: Negative for dizziness, tremors, sensory change, speech change, focal weakness, loss of consciousness and weakness.  Endo/Heme/Allergies: Does not bruise/bleed easily.  Psychiatric/Behavioral: Negative for substance abuse. The patient is not nervous/anxious.   All other systems reviewed and are negative.     PHYSICAL EXAM:  VS:  BP 138/81 mmHg  Pulse 81  Ht 5' 6.75" (1.695 m)  Wt 206 lb 12 oz (93.781 kg)  BMI 32.64 kg/m2 BMI: Body mass index is 32.64 kg/(m^2). Well nourished, well developed, in no acute distress HEENT: normocephalic, atraumatic Neck: no JVD, carotid bruits or masses Cardiac:  normal S1, S2; RRR; no murmurs, rubs, or gallops Lungs:  clear to auscultation bilaterally, no wheezing, rhonchi or rales Abd: obese, soft, nontender, no hepatomegaly, + BS MS: no deformity or atrophy Ext: 1+ pre-tibial edema bilaterally Skin: warm and dry, no rash Neuro:  moves all extremities spontaneously, no focal abnormalities noted, follows commands Psych: euthymic mood, full affect   EKG:  Was not ordered today.   Recent Labs: 12/31/2015: Hemoglobin 13.7 01/04/2016: ALT 21; BUN 13; Creatinine, Ser 0.88; Platelets 307; Potassium 3.8; Sodium 140  10/05/2015: Cholesterol 188; HDL 65; LDL Cholesterol 110; Total CHOL/HDL Ratio 2.9; Triglycerides 65; VLDL 13     Estimated Creatinine Clearance: 98.4 mL/min (by C-G formula based on Cr of 0.88).   Wt Readings from Last 3 Encounters:  01/11/16 206 lb 12 oz (93.781 kg)  01/04/16 209 lb (94.802 kg)  01/04/16 209 lb (94.802 kg)     Other studies reviewed: Additional studies/records reviewed today include: summarized above  ASSESSMENT AND PLAN:  1. HTN: -Much improved on antihypertensives since her hospital admission unfortunately, amlodipine has led to LE swelling -Change amlodipine to HCTZ 12.5 mg daily -Increase Toprol XL to 50 mg daily, hold parameters given -Check bmet  2. History of elevated troponin: -No anginal symptoms -Echo as above -Schedule treadmill Myoview to evaluate for high risk ischemia  3. Obesity: -Weight loss advised  Disposition: F/u with me in one month  Current medicines are reviewed at length with the patient today.  The patient did not have any concerns regarding medicines.  Melvern Banker PA-C 01/11/2016 11:52 AM     [place] [address] [place] [address] [place], [place] [address] [phone]

## 2016-01-11 ENCOUNTER — Encounter: Payer: Self-pay | Admitting: Physician Assistant

## 2016-01-11 ENCOUNTER — Ambulatory Visit (INDEPENDENT_AMBULATORY_CARE_PROVIDER_SITE_OTHER): Payer: BLUE CROSS/BLUE SHIELD | Admitting: Physician Assistant

## 2016-01-11 VITALS — BP 138/81 | HR 81 | Ht 66.75 in | Wt 206.8 lb

## 2016-01-11 DIAGNOSIS — I10 Essential (primary) hypertension: Secondary | ICD-10-CM | POA: Diagnosis not present

## 2016-01-11 DIAGNOSIS — R778 Other specified abnormalities of plasma proteins: Secondary | ICD-10-CM

## 2016-01-11 DIAGNOSIS — R7989 Other specified abnormal findings of blood chemistry: Secondary | ICD-10-CM

## 2016-01-11 MED ORDER — HYDROCHLOROTHIAZIDE 12.5 MG PO CAPS: 12.5000 mg | ORAL_CAPSULE | Freq: Every day | ORAL | Status: DC

## 2016-01-11 MED ORDER — METOPROLOL SUCCINATE ER 50 MG PO TB24: 50.0000 mg | ORAL_TABLET | Freq: Every day | ORAL | Status: DC

## 2016-01-11 NOTE — Patient Instructions (Addendum)
Medication Instructions:  Your physician has recommended you make the following change in your medication:  STOP taking amlodipine INCREASE metoprolol to 4m daily START taking hydrocholorathiazide 12.569mdaily   Labwork: BMET  Testing/Procedures: Your physician has requested that you have a lexiscan myoview. For further information please visit [email or web address] follow instruction sheet, as given.  ARCentrevilleYour caregiver has ordered a Stress Test with nuclear imaging. The purpose of this test is to evaluate the blood supply to your heart muscle. This procedure is referred to as a "Non-Invasive Stress Test." This is because other than having an IV started in your vein, nothing is inserted or "invades" your body. Cardiac stress tests are done to find areas of poor blood flow to the heart by determining the extent of coronary artery disease (CAD). Some patients exercise on a treadmill, which naturally increases the blood flow to your heart, while others who are  unable to walk on a treadmill due to physical limitations have a pharmacologic/chemical stress agent called Lexiscan . This medicine will mimic walking on a treadmill by temporarily increasing your coronary blood flow.   Please note: these test may take anywhere between 2-4 hours to complete  PLEASE REPORT TO ARJoshuaT THE FIRST DESK WILL DIRECT YOU WHERE TO GO  Date of Procedure: Monday, January 30  Arrival Time for Procedure: 7:15am  Instructions regarding medication:    __xx__:  Hold metoprolol the night before procedure and morning of procedure  __xx__:  Hold other medications as follows: HCTZ the morning of your procedure. You may resume afterwards.   PLEASE NOTIFY THE OFFICE AT LEAST [id number] IN ADVANCE IF YOU ARE UNABLE TO KEEP YOUR APPOINTMENT.  [id number]  PLEASE NOT[facility] HOU[facility] IN ADVANCE IF YOU ARE UNABLE TO KEEP YOUR  APPOINTMENT. [phone] to prepare for your Myoview test:   Do not eat or drink after midnight  No caffeine for 24 hours prior to test  No smoking 24 hours prior to test.  Your medication may be taken with water.  If your doctor stopped a medication because of this test, do not take that medication.  Ladies, please do not wear dresses.  Skirts or pants are appropriate. Please wear a short sleeve shirt.  No perfume, cologne or lotion.  Wear comfortable walking shoes. No heels!            Follow-Up: Your physician recommends that you schedule a follow-up appointment in: one month with RyChristell FaithPA-C   Any Other Special Instructions Will Be Listed Below (If Applicable).     If you need a refill on your cardiac medications before your next appointment, please call your pharmacy.  Cardiac Nuclear Scanning A cardiac nuclear scan is used to check your heart for problems, such as the following:  A portion of the heart is not getting enough blood.  Part of the heart muscle has died, which happens with a heart attack.  The heart wall is not working normally.  In this test, a radioactive dye (tracer) is injected into your bloodstream. After the tracer has traveled to your heart, a scanning device is used to measure how much of the tracer is absorbed by or distributed to various areas of your heart. LET [facility] HospitalARE PROVIDER KNOW ABOUT:  Any allergies you have.  All medicines you are taking, including vitamins, herbs, eye drops, creams, and over-the-counter medicines.  Previous  problems you or members of your family have had with the use of anesthetics.  Any blood disorders you have.  Previous surgeries you have had.  Medical conditions you have.  RISKS AND COMPLICATIONS Generally, this is a safe procedure. However, as with any procedure, problems can occur. Possible problems include:   Serious chest pain.  Rapid heartbeat.  Sensation of warmth in  your chest. This usually passes quickly. BEFORE THE PROCEDURE Ask your health care provider about changing or stopping your regular medicines. PROCEDURE This procedure is usually done at a hospital and takes 2-4 hours.  An IV tube is inserted into one of your veins.  Your health care provider will inject a small amount of radioactive tracer through the tube.  You will then wait for 20-40 minutes while the tracer travels through your bloodstream.  You will lie down on an exam table so images of your heart can be taken. Images will be taken for about 15-20 minutes.  You will exercise on a treadmill or stationary bike. While you exercise, your heart activity will be monitored with an electrocardiogram (ECG), and your blood pressure will be checked.  If you are unable to exercise, you may be given a medicine to make your heart beat faster.  When blood flow to your heart has peaked, tracer will again be injected through the IV tube.  After 20-40 minutes, you will get back on the exam table and have more images taken of your heart.  When the procedure is over, your IV tube will be removed. AFTER THE PROCEDURE  You will likely be able to leave shortly after the test. Unless your health care provider tells you otherwise, you may return to your normal schedule, including diet, activities, and medicines.  Make sure you find out how and when you will get your test results.   This information is not intended to replace advice given to you by your health care provider. Make sure you discuss any questions you have with your health care provider.   Document Released: 12/29/2004 Document Revised: 12/09/2013 Document Reviewed: 11/12/2013 Elsevier Interactive Patient Education [employer].

## 2016-01-12 LAB — BASIC METABOLIC PANEL
BUN/Creatinine Ratio: 12 (ref 9–23)
BUN: 11 mg/dL (ref 6–24)
CALCIUM: 9.3 mg/dL (ref 8.7–10.2)
CHLORIDE: 101 mmol/L (ref 96–106)
CO2: 24 mmol/L (ref 18–29)
CREATININE: 0.92 mg/dL (ref 0.57–1.00)
GFR calc non Af Amer: 78 mL/min/{1.73_m2} (ref >59)
GFR, EST AFRICAN AMERICAN: 89 mL/min/{1.73_m2} (ref >59)
GLUCOSE: 102 mg/dL — AB (ref 65–99)
Potassium: 4.1 mmol/L (ref 3.5–5.2)
Sodium: 142 mmol/L (ref 134–144)

## 2016-01-17 ENCOUNTER — Encounter
Admission: RE | Admit: 2016-01-17 | Discharge: 2016-01-17 | Disposition: A | Discharge status: Home / Self Care | Payer: BLUE CROSS/BLUE SHIELD | Source: Ambulatory Visit | Attending: Physician Assistant | Admitting: Physician Assistant

## 2016-01-17 DIAGNOSIS — R7989 Other specified abnormal findings of blood chemistry: Secondary | ICD-10-CM | POA: Diagnosis present

## 2016-01-17 DIAGNOSIS — R778 Other specified abnormalities of plasma proteins: Secondary | ICD-10-CM

## 2016-01-17 LAB — NM MYOCAR MULTI W/SPECT W/WALL MOTION / EF
CHL CUP NUCLEAR SDS: 0
CHL CUP NUCLEAR SRS: 4
CHL CUP NUCLEAR SSS: 1
CHL CUP RESTING HR STRESS: 67 {beats}/min
CHL CUP STRESS STAGE 2 GRADE: 0 %
CHL CUP STRESS STAGE 2 HR: 76 {beats}/min
CHL CUP STRESS STAGE 2 SPEED: 0 mph
CHL CUP STRESS STAGE 3 GRADE: 0 %
CHL CUP STRESS STAGE 3 HR: 75 {beats}/min
CHL CUP STRESS STAGE 3 SPEED: 0 mph
CHL CUP STRESS STAGE 5 SPEED: 0 mph
CSEPEW: 1 METS
LV dias vol: 97 mL
LVSYSVOL: 40 mL
Peak HR: 94 {beats}/min
Percent HR: 53 %
Percent of predicted max HR: 52 %
Stage 1 HR: 72 {beats}/min
Stage 4 Grade: 0 %
Stage 4 HR: 94 {beats}/min
Stage 4 Speed: 0 mph
Stage 5 DBP: 84 mmHg
Stage 5 Grade: 0 %
Stage 5 HR: 80 {beats}/min
Stage 5 SBP: 132 mmHg
TID: 0.95

## 2016-01-17 MED ORDER — TECHNETIUM TC 99M SESTAMIBI - CARDIOLITE
33.1600 | Freq: Once | INTRAVENOUS | Status: AC | PRN
  Administered 2016-01-17: 09:00:00 33.16 via INTRAVENOUS

## 2016-01-17 MED ORDER — REGADENOSON 0.4 MG/5ML IV SOLN
0.4000 mg | Freq: Once | INTRAVENOUS | Status: AC
  Administered 2016-01-17: 0.4 mg via INTRAVENOUS
  Filled 2016-01-17: qty 5

## 2016-01-17 MED ORDER — TECHNETIUM TC 99M SESTAMIBI - CARDIOLITE
14.1900 | Freq: Once | INTRAVENOUS | Status: AC | PRN
  Administered 2016-01-17: 14.19 via INTRAVENOUS

## 2016-01-24 ENCOUNTER — Encounter: Payer: Self-pay | Admitting: Obstetrics & Gynecology

## 2016-01-24 ENCOUNTER — Other Ambulatory Visit (INDEPENDENT_AMBULATORY_CARE_PROVIDER_SITE_OTHER): Payer: BLUE CROSS/BLUE SHIELD | Admitting: *Deleted

## 2016-01-24 ENCOUNTER — Telehealth: Payer: Self-pay | Admitting: Family Medicine

## 2016-01-24 DIAGNOSIS — R809 Proteinuria, unspecified: Secondary | ICD-10-CM

## 2016-01-24 DIAGNOSIS — R300 Dysuria: Secondary | ICD-10-CM | POA: Diagnosis not present

## 2016-01-24 LAB — POCT URINALYSIS DIPSTICK
BILIRUBIN UA: NEGATIVE
Glucose, UA: NEGATIVE
KETONES UA: NEGATIVE
Leukocytes, UA: NEGATIVE
NITRITE UA: NEGATIVE
PH UA: 7
Protein, UA: NEGATIVE
RBC UA: NEGATIVE
SPEC GRAV UA: 1.01
Urobilinogen, UA: NEGATIVE

## 2016-01-24 NOTE — Telephone Encounter (Signed)
Patient was seen a couple weeks ago and Dr Manuella Ghazi told the patient that he would refer her to Nephrologist. Patient is checking status on the referral

## 2016-01-24 NOTE — Progress Notes (Signed)
Patient here to drop off a urine specimen today.  Will send off for culture.

## 2016-01-25 LAB — URINE CULTURE

## 2016-01-25 NOTE — Telephone Encounter (Signed)
Routed to Dr. Manuella Ghazi for referral

## 2016-01-26 ENCOUNTER — Telehealth: Payer: Self-pay

## 2016-01-26 NOTE — Telephone Encounter (Signed)
erroneous

## 2016-02-03 NOTE — Progress Notes (Signed)
Cardiology Office Note Date:  02/04/2016  Patient ID:  Amanda Fields, Amanda Fields 02-11-1976, MRN [id number] PCP:  Keith Rake, MD  Cardiologist:  Dr. Martinique, MD    Chief Complaint: HTN follow up  History of Present Illness: Amanda Fields is a 40 y.o. female with history of poorly controlled HTN and moderate LVH presents for routine follow up for her HTN. She previously had an echo on 04/10/2012 that showed normal LV systolic function with an EF of 60-65%, moderate LVH, mild concentric hypertrophy, no RWMA, LV diastolic function parameters were normal, left atrium was normal in size, RV systolic function was normal, PASP was normal. She had been off all antihypertensives for approximately one year when she was admitted to [facility] from 1/13 to 1/14 for malignant HTN. She was originally scheduled to undergo routine outpatient procedure. In pre-op her BP was found to be 160-170/111 and did not respond to antihypertensives administered by anesthesiologist. She was advised to follow up as an outpatient later that day with [facility]. She proceeded to go to lunch with a friend, became nauseated, vomited, and developed a HA. This prompted her to come to the ED. In the ED troponin was checked and found to be mildly elevated at 0.10-->0.16. She denied any chest pain. Echo showed EF 55-60%. BP improved upon admission. She was discharged on metoprolol and amlodipine. In hospital follow up on 01/04/16 she noted amlodipine had led to lower extremity swelling that was not present prior to starting this medication. No further headache. No chest pain. BP had been well controlled at home. She did note some generalized fatigue. She underwent a treadmill Myoview on 01/17/2016 that demonstrated a hypertensive response to exercise and the test was switched to a lexiscan. TWI was noted during stress in II, III, aVF, V3-V6 beginning at 0 minutes and ending at 3 minutes of stress. There was a medium defect of moderate severity present  in the apex location felt to be 2/2 breast attenuation. The study was normal, however the study was of poor quality given breast attenuation and intense GI uptake. EF was 55-65%. Overall, this was a low risk study.   Blood pressure readings have not been that accurate at home with he wrist cuff. She continues to note mild bilateral pre-tibial edema. This has been an ongoing issue for her for many years. She has previously tried compression hose without much success. Weight has been stable. She is taking her medication as directed, though does not note much urine output with HCTZ 12.5 mg daily. No chest pain, headaches, vision changes, or focal deficits. She has an appointment to see nephrology next week.     Past Medical History  Diagnosis Date  . Hypertension     a. echo 03/2012 EF 60-65%, moderate LVH, nl LV diastolic fxn, nl PASP; b. echo 12/2015: EF 55-60%  . Edema leg   . Dizziness   . Migraine headache   . Asthma   . Palpitation   . Endometriosis   . 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 Obesity   . Cough due to ACE inhibitor     Past Surgical History  Procedure Laterality Date  . Tonsillectomy    . Wisdom tooth extraction    . Laparoscopic supracervical hysterectomy  09/27/2010    For endometriosis. Ovaries anf tubes are in place.  . Tubal ligation  2006    Current Outpatient Prescriptions  Medication Sig Dispense Refill  . aspirin EC 81 MG EC tablet Take 1  tablet (81 mg total) by mouth daily. 30 tablet 0  . Chlorpheniramine Maleate (ALLERGY PO) Take by mouth as needed.    Marland Kitchen escitalopram (LEXAPRO) 20 MG tablet Take 1 tablet (20 mg total) by mouth daily. 31 tablet 12  . metoprolol succinate (TOPROL-XL) 50 MG 24 hr tablet Take 1 tablet (50 mg total) by mouth daily. 30 tablet 3  . multivitamin-iron-minerals-folic acid (CENTRUM) chewable tablet Chew 1 tablet by mouth daily.      Marland Kitchen estradiol (VIVELLE-DOT) 0.025 MG/24HR Place 1 patch onto the skin 2 (two) times a week. (Patient not taking:  Reported on 02/04/2016) 8 patch 12  . hydrochlorothiazide (HYDRODIURIL) 25 MG tablet Take 1 tablet (25 mg total) by mouth daily. 90 tablet 3  . [DISCONTINUED] fluticasone (FLONASE) 50 MCG/ACT nasal spray Place 2 sprays into the nose as needed.      . [DISCONTINUED] lisinopril-hydrochlorothiazide (PRINZIDE) 20-12.5 MG per tablet Take 1 tablet by mouth daily. 30 tablet 6  . [DISCONTINUED] torsemide (DEMADEX) 10 MG tablet Take 10 mg by mouth daily.       No current facility-administered medications for this visit.    Allergies:   Ace inhibitors and Latex   Social History:  The patient  reports that she has never smoked. She has never used smokeless tobacco. She reports that she does not drink alcohol or use illicit drugs.   Family History:  The patient's family history includes COPD in her mother; Cancer in her father; Colon cancer in her father; Emphysema in her father; Heart attack (age of onset: 69) in her cousin; Heart disease in her cousin; Heart failure in her paternal uncle; Hyperlipidemia in her sister; Hypotension in her mother.  ROS:   Review of Systems  Constitutional: Positive for malaise/fatigue. Negative for fever, chills, weight loss and diaphoresis.  HENT: Negative for congestion.   Eyes: Negative for discharge and redness.  Respiratory: Negative for cough, hemoptysis, sputum production, shortness of breath and wheezing.   Cardiovascular: Positive for leg swelling. Negative for chest pain, palpitations, orthopnea, claudication and PND.  Gastrointestinal: Negative for nausea and vomiting.  Musculoskeletal: Negative for myalgias and falls.  Skin: Negative for rash.  Neurological: Negative for dizziness, tingling, tremors, sensory change, speech change, focal weakness, loss of consciousness and weakness.  Endo/Heme/Allergies: Does not bruise/bleed easily.  Psychiatric/Behavioral: The patient is not nervous/anxious.       PHYSICAL EXAM:  VS:  BP 138/100 mmHg  Pulse 69  Ht 5'  6" (1.676 m)  Wt 214 lb 4 oz (97.183 kg)  BMI 34.60 kg/m2 BMI: Body mass index is 34.6 kg/(m^2). Well nourished, well developed, in no acute distress HEENT: normocephalic, atraumatic Neck: no JVD, carotid bruits or masses Cardiac:  normal S1, S2; RRR; no murmurs, rubs, or gallops Lungs:  clear to auscultation bilaterally, no wheezing, rhonchi or rales Abd: obese, soft, nontender, no hepatomegaly, + BS MS: no deformity or atrophy Ext: trace pre-tibial edema along the bilateral lower extremities Skin: warm and dry, no rash Neuro:  moves all extremities spontaneously, no focal abnormalities noted, follows commands Psych: euthymic mood, full affect   EKG:  Was not ordered today.   Recent Labs: 12/31/2015: Hemoglobin 13.7 01/04/2016: ALT 21; Platelets 307 01/11/2016: BUN 11; Creatinine, Ser 0.92; Potassium 4.1; Sodium 142  10/05/2015: Cholesterol 188; HDL 65; LDL Cholesterol 110; Total CHOL/HDL Ratio 2.9; Triglycerides 65; VLDL 13   CrCl cannot be calculated (Patient has no serum creatinine result on file.).   Wt Readings from Last 3 Encounters:  02/04/16 214 lb 4 oz (97.183 kg)  01/11/16 206 lb 12 oz (93.781 kg)  01/04/16 209 lb (94.802 kg)     Other studies reviewed: Additional studies/records reviewed today include: summarized above  ASSESSMENT AND PLAN:  1. HTN: Improving, though still not optimal. Increase HCTZ to 25 mg daily. Continue Toprol-XL 50 mg daily. Check renal artery duplex along with cortisol and renin/aldosterone levels to evaluate for secondary hypertension. Continue healthy diet and exercise.   2. History of elevated troponin: Normal nuclear stress test as above. No further ischemic work up at this time.   3. Obesity: Weight loss is advised.   4. History of mild proteinuria: Has follow up with nephrology. Last UA from 11 days ago, resolved. Total protein on last CMET slightly elevated.   5. Lower extremity swelling: TED hose. HCTZ as above. Consider Lasix in  place of HCTZ.   Disposition: F/u with Dr. Rockey Situ in 1 month  Current medicines are reviewed at length with the patient today.  The patient did not have any concerns regarding medicines.  Melvern Banker PA-C 02/04/2016 10:15 AM     [address] [place], [place] [address] [phone]

## 2016-02-04 ENCOUNTER — Encounter: Payer: Self-pay | Admitting: Physician Assistant

## 2016-02-04 ENCOUNTER — Encounter (INDEPENDENT_AMBULATORY_CARE_PROVIDER_SITE_OTHER): Payer: Self-pay

## 2016-02-04 ENCOUNTER — Encounter: Payer: Self-pay | Admitting: Family Medicine

## 2016-02-04 ENCOUNTER — Ambulatory Visit (INDEPENDENT_AMBULATORY_CARE_PROVIDER_SITE_OTHER): Payer: BLUE CROSS/BLUE SHIELD | Admitting: Physician Assistant

## 2016-02-04 ENCOUNTER — Ambulatory Visit (INDEPENDENT_AMBULATORY_CARE_PROVIDER_SITE_OTHER): Payer: BLUE CROSS/BLUE SHIELD | Admitting: Family Medicine

## 2016-02-04 ENCOUNTER — Other Ambulatory Visit
Admission: RE | Admit: 2016-02-04 | Discharge: 2016-02-04 | Disposition: A | Discharge status: Home / Self Care | Payer: BLUE CROSS/BLUE SHIELD | Source: Ambulatory Visit | Attending: Physician Assistant | Admitting: Physician Assistant

## 2016-02-04 VITALS — BP 138/100 | HR 69 | Ht 66.0 in | Wt 214.2 lb

## 2016-02-04 VITALS — BP 138/81 | HR 81 | Temp 98.1°F | Resp 18 | Ht 67.0 in | Wt 214.0 lb

## 2016-02-04 DIAGNOSIS — J302 Other seasonal allergic rhinitis: Secondary | ICD-10-CM

## 2016-02-04 DIAGNOSIS — R7989 Other specified abnormal findings of blood chemistry: Secondary | ICD-10-CM | POA: Insufficient documentation

## 2016-02-04 DIAGNOSIS — I10 Essential (primary) hypertension: Secondary | ICD-10-CM

## 2016-02-04 DIAGNOSIS — M7989 Other specified soft tissue disorders: Secondary | ICD-10-CM | POA: Diagnosis not present

## 2016-02-04 DIAGNOSIS — R778 Other specified abnormalities of plasma proteins: Secondary | ICD-10-CM

## 2016-02-04 DIAGNOSIS — E669 Obesity, unspecified: Secondary | ICD-10-CM

## 2016-02-04 DIAGNOSIS — J309 Allergic rhinitis, unspecified: Secondary | ICD-10-CM | POA: Insufficient documentation

## 2016-02-04 DIAGNOSIS — R809 Proteinuria, unspecified: Secondary | ICD-10-CM

## 2016-02-04 LAB — COMPREHENSIVE METABOLIC PANEL
ALBUMIN: 4 g/dL (ref 3.5–5.0)
ALT: 31 U/L (ref 14–54)
ANION GAP: 10 (ref 5–15)
AST: 65 U/L — AB (ref 15–41)
Alkaline Phosphatase: 63 U/L (ref 38–126)
BUN: 14 mg/dL (ref 6–20)
CHLORIDE: 105 mmol/L (ref 101–111)
CO2: 26 mmol/L (ref 22–32)
Calcium: 8.8 mg/dL — ABNORMAL LOW (ref 8.9–10.3)
Creatinine, Ser: 0.99 mg/dL (ref 0.44–1.00)
GFR calc Af Amer: >60 mL/min (ref >60)
GLUCOSE: 95 mg/dL (ref 65–99)
POTASSIUM: 3.7 mmol/L (ref 3.5–5.1)
Sodium: 141 mmol/L (ref 135–145)
Total Bilirubin: 0.5 mg/dL (ref 0.3–1.2)
Total Protein: 7.3 g/dL (ref 6.5–8.1)

## 2016-02-04 LAB — TSH: TSH: 1.503 u[IU]/mL (ref 0.350–4.500)

## 2016-02-04 LAB — CORTISOL: CORTISOL PLASMA: 8.4 ug/dL

## 2016-02-04 MED ORDER — MOMETASONE FUROATE 50 MCG/ACT NA SUSP: 2.0000 | Freq: Every day | NASAL | Status: DC

## 2016-02-04 MED ORDER — HYDROCHLOROTHIAZIDE 25 MG PO TABS: 25.0000 mg | ORAL_TABLET | Freq: Every day | ORAL | Status: DC

## 2016-02-04 NOTE — Patient Instructions (Addendum)
Medication Instructions:  We are increasing your hydrochlorothiazide to 25 mg Once daily. Continue all other meds.  Labwork: Cortisol, renin-aldosterone, CMET, TSH Go to the [facility] to have your labs drawn.  Testing/Procedures: Your physician has requested that you have a renal artery duplex. During this test, an ultrasound is used to evaluate blood flow to the kidneys. Allow one hour for this exam. Do not eat after midnight the day before and avoid carbonated beverages. Take your medications as you usually do.  Date & time: _________________  Follow-Up: Your physician recommends that you schedule a follow-up appointment in:  1 month with [facility]  Date & time: ___________________  If you need a refill on your cardiac medications before your next appointment, please call your pharmacy.  Renovascular Hypertension Renovascular hypertension is high blood pressure that is caused by a narrowing of the arteries that carry blood to the kidneys (renal arteries).  Complications from renovascular hypertension can lead to:   Heart failure.  Heart disease.  Stroke.  Kidney failure.  Blood vessel damage.  Blindness. CAUSES  Renovascular hypertension occurs when one or both of the renal arteries become narrow. This narrowing reduces blood flow to the kidneys, which makes the kidneys think blood pressure is low. As a result, they make an enzyme called renin. Renin tells the body to keep in salt and water rather than letting it leave the body as urine. This causes an increase in blood pressure.  There are numerous conditions that can cause renovascular hypertension. Some of these are:   Atherosclerosis. This is a hardening of the renal arteries. It causes plaque to build up and block the renal arteries.   Fibromuscular dysplasia. This is a condition in which cells of the artery wall overgrow, causing a narrowing of the renal arteries. It is a common cause of renovascular  hypertension in younger women.   A blockage in the renal artery due to injury, tumors, or blood clots (rare). RISK FACTORS Renovascular hypertension is more common in women younger than 43 years and men older than 50 years.  SIGNS AND SYMPTOMS  There are often no symptoms. If symptoms are present, they may include:   Sudden high blood pressure that gets worse in older persons with previously well-controlled blood pressure.   Nausea and vomiting.   Problems with vision.   Chest pain.  DIAGNOSIS  Renovascular hypertension is often suspected during a routine exam or blood pressure check. Your health care provider may use a stethoscope to listen for a "whooshing" noise over the abdominal or flank area (bruit). Tests to diagnose renovascular hypertension include:   Blood tests. These are done to look at your hormone levels. Tests may also be done to measure renin and aldosterone levels. Aldosterone is a hormone that controls the salt and water balance in your body.   Imaging tests. These may include:  An ultrasound. This is a test that uses sound waves to produce an image of the inside of your body.  Renal angiography. This is a test in which a dye is injected into a kidney artery to show narrowing of the artery on an X-ray.  MRI of the arteries supplying the kidneys. TREATMENT   Medicines. These may be given to help you control your blood pressure.   Treatment of any condition causing the high blood pressure. This may mean lowering your cholesterol, eating a heart-healthy diet, exercising, and maintaining an ideal body weight.   Surgery to remove a blockage. This may  be necessary if a renal artery is blocked badly.   Percutaneous transluminal renal angioplasty (PTRA). This is a procedure to open narrow renal arteries if they are not completely blocked. Sometimes a stent is placed in the artery to prevent the artery from becoming blocked again. HOME CARE INSTRUCTIONS  Take  medicines only as directed by your health care provider.   Maintain an ideal body weight.   Eat a heart-healthy diet. This includes:  Managing your cholesterol levels.  Controlling your salt (sodium) intake. This can lower your blood pressure. Avoid eating foods high in sodium, such as processed meats, MSG, and baking soda.  Exercise regularly.   Stop smoking if you smoke.   Avoid drinking alcohol. Your health care provider can help you determine how much alcohol is safe for you to drink.   Monitor your blood pressure. Monitoring Your Blood Pressure  A blood pressure reading is recorded as two numbers, such as 120 over 80 (or 120/80). The first, higher number is called the systolic pressure. Generally, this number should be under 130. The second, lower number is called the diastolic pressure. It should be below 90.  Keeping your blood pressure in a normal range is important to your overall health and prevention of health problems, such as heart disease and stroke. When your blood pressure is uncontrolled, your heart has to work harder than normal.  SEEK IMMEDIATE MEDICAL CARE IF:  You develop shortness of breath.   You develop numbness on one side.   You develop areas of muscle weakness.   You are unable to speak.  You feel light-headed or pass out.   You have sudden elevations of blood pressure.   You have symptoms of very high blood pressure.   This information is not intended to replace advice given to you by your health care provider. Make sure you discuss any questions you have with your health care provider.   Document Released: 11/17/2005 Document Revised: 12/25/2014 Document Reviewed: 07/23/2013 Elsevier Interactive Patient Education [employer].

## 2016-02-04 NOTE — Progress Notes (Signed)
Name: Amanda Fields   MRN: [id number]    DOB: 01-30-1976   Date:02/04/2016       Progress Note  Subjective  Chief Complaint  Chief Complaint  Patient presents with  . Follow-up    1 mo  . Hypertension    HPI  Allergic Symptoms  Pt. Reports symptoms of allergy including itchiness in eyes, throat, and sneezing, more so at night. She is reportedly allergic to numerous environmental allergens and has been through formal allergy testing.    Past Medical History  Diagnosis Date  . Hypertension     a. echo 03/2012 EF 60-65%, moderate LVH, nl LV diastolic fxn, nl PASP; b. echo 12/2015: EF 55-60%  . Edema leg   . Dizziness   . Migraine headache   . Asthma   . Palpitation   . Endometriosis   . 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 Obesity   . Cough due to ACE inhibitor     Past Surgical History  Procedure Laterality Date  . Tonsillectomy    . Wisdom tooth extraction    . Laparoscopic supracervical hysterectomy  09/27/2010    For endometriosis. Ovaries anf tubes are in place.  . Tubal ligation  2006    Family History  Problem Relation Age of Onset  . COPD Mother   . Hypotension Mother   . Colon cancer Father   . Emphysema Father   . Cancer Father     colon  . Hyperlipidemia Sister   . Heart failure Paternal Uncle   . Heart attack Cousin 32  . Heart disease Cousin     heart attack    Social History   Social History  . Marital Status: Single    Spouse Name: N/A  . Number of Children: 2  . Years of Education: N/A   Occupational History  . call center    Social History Main Topics  . Smoking status: Never Smoker   . Smokeless tobacco: Never Used  . Alcohol Use: No     Comment: social  . Drug Use: No  . Sexual Activity:    Partners: Male    Birth Control/ Protection: Surgical   Other Topics Concern  . Not on file   Social History Narrative     Current outpatient prescriptions:  .  aspirin EC 81 MG EC tablet, Take 1 tablet (81 mg total) by mouth daily.,  Disp: 30 tablet, Rfl: 0 .  escitalopram (LEXAPRO) 20 MG tablet, Take 1 tablet (20 mg total) by mouth daily., Disp: 31 tablet, Rfl: 12 .  estradiol (VIVELLE-DOT) 0.025 MG/24HR, Place 1 patch onto the skin 2 (two) times a week., Disp: 8 patch, Rfl: 12 .  hydrochlorothiazide (HYDRODIURIL) 25 MG tablet, Take 1 tablet (25 mg total) by mouth daily., Disp: 90 tablet, Rfl: 3 .  metoprolol succinate (TOPROL-XL) 50 MG 24 hr tablet, Take 1 tablet (50 mg total) by mouth daily., Disp: 30 tablet, Rfl: 3 .  multivitamin-iron-minerals-folic acid (CENTRUM) chewable tablet, Chew 1 tablet by mouth daily.  , Disp: , Rfl:  .  [DISCONTINUED] fluticasone (FLONASE) 50 MCG/ACT nasal spray, Place 2 sprays into the nose as needed.  , Disp: , Rfl:  .  [DISCONTINUED] lisinopril-hydrochlorothiazide (PRINZIDE) 20-12.5 MG per tablet, Take 1 tablet by mouth daily., Disp: 30 tablet, Rfl: 6 .  [DISCONTINUED] torsemide (DEMADEX) 10 MG tablet, Take 10 mg by mouth daily.  , Disp: , Rfl:   Allergies  Allergen Reactions  . Ace Inhibitors Cough  . Latex  Itching    gloves     Review of Systems  Constitutional: Negative for fever.  Cardiovascular: Negative for chest pain.     Objective  Filed Vitals:   02/04/16 1103  BP: 138/81  Pulse: 81  Temp: 98.1 F (36.7 C)  TempSrc: Oral  Resp: 18  Height: 5' 7"  (1.702 m)  Weight: 214 lb (97.07 kg)  SpO2: 97%    Physical Exam  Constitutional: She is oriented to person, place, and time and well-developed, well-nourished, and in no distress.  HENT:  Right Ear: Tympanic membrane and ear canal normal.  Left Ear: Tympanic membrane and ear canal normal.  Nose: Mucosal edema present. Right sinus exhibits no maxillary sinus tenderness. Left sinus exhibits no maxillary sinus tenderness.  Mouth/Throat: No posterior oropharyngeal erythema.  Nasal turbinate hypertrophy, mucosal inflammation  Cardiovascular: Normal rate and regular rhythm.   Pulmonary/Chest: Effort normal and breath  sounds normal.  Neurological: She is alert and oriented to person, place, and time.  Nursing note and vitals reviewed.       Assessment & Plan  1. Other seasonal allergic rhinitis Has used Flonase in the past which does not work. Will change to Nasonex and advised to take Allegra for her symptoms of allergy. Recheck if no improvement. - mometasone (NASONEX) 50 MCG/ACT nasal spray; Place 2 sprays into the nose daily.  Dispense: 17 g; Refill: 0   [name] [facility] 02/04/2016 11:25 AM

## 2016-02-08 ENCOUNTER — Ambulatory Visit: Payer: BLUE CROSS/BLUE SHIELD

## 2016-02-08 DIAGNOSIS — E669 Obesity, unspecified: Secondary | ICD-10-CM

## 2016-02-08 DIAGNOSIS — R7989 Other specified abnormal findings of blood chemistry: Secondary | ICD-10-CM

## 2016-02-08 DIAGNOSIS — I10 Essential (primary) hypertension: Secondary | ICD-10-CM | POA: Diagnosis not present

## 2016-02-08 DIAGNOSIS — R778 Other specified abnormalities of plasma proteins: Secondary | ICD-10-CM

## 2016-02-12 LAB — ALDOSTERONE + RENIN ACTIVITY W/ RATIO
ALDO / PRA RATIO: 50.9 — AB (ref 0.0–30.0)
Aldosterone: 14.1 ng/dL (ref 0.0–30.0)
PRA LC/MS/MS: 0.277 ng/mL/h (ref 0.167–5.380)

## 2016-03-01 ENCOUNTER — Ambulatory Visit (INDEPENDENT_AMBULATORY_CARE_PROVIDER_SITE_OTHER): Payer: BLUE CROSS/BLUE SHIELD | Admitting: Cardiology

## 2016-03-01 ENCOUNTER — Encounter: Payer: Self-pay | Admitting: *Deleted

## 2016-03-01 ENCOUNTER — Encounter: Payer: Self-pay | Admitting: Cardiology

## 2016-03-01 VITALS — BP 150/108 | HR 69 | Ht 66.0 in | Wt 213.8 lb

## 2016-03-01 DIAGNOSIS — E669 Obesity, unspecified: Secondary | ICD-10-CM | POA: Diagnosis not present

## 2016-03-01 DIAGNOSIS — I10 Essential (primary) hypertension: Secondary | ICD-10-CM

## 2016-03-01 NOTE — Patient Instructions (Addendum)
Medication Instructions:  Your physician recommends that you continue on your current medications as directed. Please refer to the Current Medication list given to you today.   Labwork: Your physician recommends that you return for lab work first thing in the morning at the hospital. Labs ordered are Aldosterone + renin activity w/ ratio Basic Metabolic Panel (BMET) Metanephrines, plasma  Date & Time: ___________________________________________________________   Testing/Procedures: Your physician has requested that you regularly monitor and record your blood pressure readings at home. Please use the same machine at the same time of day to check your readings and record them to bring to your follow-up visit.    Follow-Up: Your physician recommends that you schedule a follow-up appointment in: 3 months with Dr. Yvone Neu  Date & Time: ____________________________________________________________   Any Other Special Instructions Will Be Listed Below (If Applicable).     If you need a refill on your cardiac medications before your next appointment, please call your pharmacy.

## 2016-03-01 NOTE — Progress Notes (Signed)
Cardiology Office Note   Date:  03/01/2016   ID:  Amanda Fields, DOB 1976-09-13, MRN [id number]  Referring Doctor:  Keith Rake, MD   Cardiologist:   Wende Bushy, MD   Reason for consultation:  Chief Complaint  Patient presents with  . other    F/u renal US & labs C/o cramping. Meds reviewed verbally with pt.      History of Present Illness: Amanda Fields is a 40 y.o. female who presents for Follow-up for hypertension.  Patient reports that since her medications were adjusted the last time, HCTZ was discontinued and Lasix was started, she thought that her blood pressure measurements have improved. This was prior to her using Sudafed for nasal congestion. Prior to using Sudafed, her blood pressure was in the 118's over 80s. No headache, chest pain, shortness of breath. She needed to start using Sudafed when necessary for her nasal congestion and she is aware that her blood pressure recordings are much higher. She is willing to wean off the Sudafed as she is concerned about her blood pressure numbers going up.  She complains of some cramping in her legs since starting the Lasix.  She denies headache, chest pain, shortness of breath, orthopnea, PND, edema. She complains of nasal congestion, runny nose.  ROS:  Please see the history of present illness. Aside from mentioned under HPI, all other systems are reviewed and negative.     Past Medical History  Diagnosis Date  . Hypertension     a. echo 03/2012 EF 60-65%, moderate LVH, nl LV diastolic fxn, nl PASP; b. echo 12/2015: EF 55-60%  . Edema leg   . Dizziness   . Migraine headache   . Asthma   . Palpitation   . Endometriosis   . 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 Obesity   . Cough due to ACE inhibitor     Past Surgical History  Procedure Laterality Date  . Tonsillectomy    . Wisdom tooth extraction    . Laparoscopic supracervical hysterectomy  09/27/2010    For endometriosis. Ovaries anf tubes are in place.  . Tubal  ligation  2006     reports that she has never smoked. She has never used smokeless tobacco. She reports that she does not drink alcohol or use illicit drugs.   family history includes COPD in her mother; Cancer in her father; Colon cancer in her father; Emphysema in her father; Heart attack (age of onset: 58) in her cousin; Heart disease in her cousin; Heart failure in her paternal uncle; Hyperlipidemia in her sister; Hypotension in her mother.   Current Outpatient Prescriptions  Medication Sig Dispense Refill  . ampicillin (PRINCIPEN) 500 MG capsule Take 500 mg by mouth daily.   3  . aspirin EC 81 MG EC tablet Take 1 tablet (81 mg total) by mouth daily. 30 tablet 0  . escitalopram (LEXAPRO) 20 MG tablet Take 1 tablet (20 mg total) by mouth daily. 31 tablet 12  . estradiol (VIVELLE-DOT) 0.025 MG/24HR Place 1 patch onto the skin 2 (two) times a week. 8 patch 12  . furosemide (LASIX) 20 MG tablet TAKE 1 TABLET BY MOUTH TWICE A DAY TAKE AS DIRECTED -TAKE WITH BREAKFAST AND LUNCH  3  . metoprolol succinate (TOPROL-XL) 50 MG 24 hr tablet Take 1 tablet (50 mg total) by mouth daily. 30 tablet 3  . multivitamin-iron-minerals-folic acid (CENTRUM) chewable tablet Chew 1 tablet by mouth daily.      . Pseudoephedrine HCl (  SUDAFED 12 HOUR PO) Take by mouth as needed.    . [DISCONTINUED] fluticasone (FLONASE) 50 MCG/ACT nasal spray Place 2 sprays into the nose as needed.      . [DISCONTINUED] lisinopril-hydrochlorothiazide (PRINZIDE) 20-12.5 MG per tablet Take 1 tablet by mouth daily. 30 tablet 6  . [DISCONTINUED] torsemide (DEMADEX) 10 MG tablet Take 10 mg by mouth daily.       No current facility-administered medications for this visit.    Allergies: Ace inhibitors and Latex    PHYSICAL EXAM: VS:  BP 150/108 mmHg  Pulse 69  Ht 5' 6"  (1.676 m)  Wt 213 lb 12 oz (96.956 kg)  BMI 34.52 kg/m2 , Body mass index is 34.52 kg/(m^2). Wt Readings from Last 3 Encounters:  03/01/16 213 lb 12 oz (96.956 kg)    02/04/16 214 lb (97.07 kg)  02/04/16 214 lb 4 oz (97.183 kg)    GENERAL:  well developed, well nourished,  obese, not in acute distress HEENT: normocephalic, pink conjunctivae, anicteric sclerae, no xanthelasma, normal dentition, oropharynx clear NECK:  no neck vein engorgement, JVP normal, no hepatojugular reflux, carotid upstroke brisk and symmetric, no bruit, no thyromegaly, no lymphadenopathy LUNGS:  good respiratory effort, clear to auscultation bilaterally CV:  PMI not displaced, no thrills, no lifts, S1 and S2 within normal limits, no palpable S3 or S4, no murmurs, no rubs, no gallops ABD:  Soft, nontender, nondistended, normoactive bowel sounds, no abdominal aortic bruit, no hepatomegaly, no splenomegaly MS: nontender back, no kyphosis, no scoliosis, no joint deformities EXT:  2+ DP/PT pulses, no edema, no varicosities, no cyanosis, no clubbing SKIN: warm, nondiaphoretic, normal turgor, no ulcers NEUROPSYCH: alert, oriented to person, place, and time, sensory/motor grossly intact, normal mood, appropriate affect  Recent Labs: 12/31/2015: Hemoglobin 13.7 01/04/2016: Platelets 307 02/04/2016: ALT 31; BUN 14; Creatinine, Ser 0.99; Potassium 3.7; Sodium 141; TSH 1.503   Lipid Panel    Component Value Date/Time   CHOL 188 10/05/2015 0859   TRIG 65 10/05/2015 0859   HDL 65 10/05/2015 0859   CHOLHDL 2.9 10/05/2015 0859   VLDL 13 10/05/2015 0859   LDLCALC 110 10/05/2015 0859     Other studies Reviewed:  EKG:  EKG is not ordered today.  Additional studies/ records that were reviewed personally reviewed by me today include:   Echocardiogram 01/01/2016: EF 55-60%.  Exercise nuclear stress test 01/17/2016:  Blood pressure demonstrated a hypertensive response to exercise. The test was switched to [place].  T wave inversion was noted during stress in the II, III, aVF, V3, V4, V5 and V6 leads, beginning at 0 minutes of stress, ending at 3 minutes of stress. T wave inversion  persisted.  Defect 1: There is a medium defect of moderate severity present in the apex location. this likely due to breast attenuation.  The study is normal. However, this is a poor quality study due to breast attenuation and intense GI uptake.  This is a low risk study.  The left ventricular ejection fraction is normal (55-65%)  Renal artery ultrasound to 02/08/2016: Normal renal arteries  PRA LC/MS/MS 0.167 - 5.380 ng/mL/hr 0.277   Comments: (NOTE)  This test was developed and its performance characteristics  determined by [facility]. It has not been cleared or approved  by the Food and Drug Administration.        **Please note reference interval change**     ALDO / PRA Ratio 0.0 - 30.0  50.9 (H)   Comments: (NOTE)  Units:   ng/dL per ng/mL/hr  Performed At: [facility]  [place], [place] [id number]  Lindon Romp MD [id number]     Aldosterone 0.0 - 30.0 ng/dL 14.1   Comments: (NOTE)  This test was developed and its performance characteristics  determined by [facility]. It has not been cleared or approved           ASSESSMENT AND PLAN:  Hypertension, elevated, likely secondary to Sudafed use Per patient blood pressure has been within the normal limits prior you to using Sudafed. Stable on the Lasix and metoprolol. We'll check BMP/potassium 4 issues with cramping. We'll replace if needed. Recommend discontinuation of Sudafed. Blood pressure monitor.  In terms of workup for secondary hypertension: PA-C/PRA ratio elevated, although aldosterone level not more than 15 though. Recommend to repeat for further verification. We'll also request for plasma fractionated metanephrine levels. No evidence of renal artery stenosis on ultrasound.  Obesity Body mass index is 34.52 kg/(m^2).Marland Kitchen Recommend aggressive weight loss through diet and increased physical activity.      Current medicines are reviewed at length with  the patient today.  The patient does not have concerns regarding medicines.  Labs/ tests ordered today include:  Orders Placed This Encounter  Procedures  . Basic Metabolic Panel (BMET)  . Aldosterone + renin activity w/ ratio  . Metanephrines, plasma    I had a lengthy and detailed discussion with the patient regarding diagnoses, prognosis, diagnostic options, treatment options, and side effects of medications.   I counseled the patient on importance of lifestyle modification including heart healthy diet, regular physical activity.  Disposition:   FU with undersigned one month  I spent at least 25 minutes with the patient today and more than 50% of the time was spent counseling the patient and coordinating care.      Signed, Wende Bushy, MD  03/01/2016 10:48 AM    [place]

## 2016-03-02 ENCOUNTER — Other Ambulatory Visit
Admission: RE | Admit: 2016-03-02 | Discharge: 2016-03-02 | Disposition: A | Discharge status: Home / Self Care | Payer: BLUE CROSS/BLUE SHIELD | Source: Ambulatory Visit | Attending: Cardiology | Admitting: Cardiology

## 2016-03-02 DIAGNOSIS — E669 Obesity, unspecified: Secondary | ICD-10-CM | POA: Diagnosis not present

## 2016-03-02 DIAGNOSIS — I10 Essential (primary) hypertension: Secondary | ICD-10-CM | POA: Diagnosis present

## 2016-03-02 DIAGNOSIS — R7989 Other specified abnormal findings of blood chemistry: Secondary | ICD-10-CM | POA: Diagnosis not present

## 2016-03-02 DIAGNOSIS — R778 Other specified abnormalities of plasma proteins: Secondary | ICD-10-CM

## 2016-03-02 LAB — BASIC METABOLIC PANEL
Anion gap: 4 — ABNORMAL LOW (ref 5–15)
BUN: 12 mg/dL (ref 6–20)
CALCIUM: 8.7 mg/dL — AB (ref 8.9–10.3)
CO2: 30 mmol/L (ref 22–32)
CREATININE: 1 mg/dL (ref 0.44–1.00)
Chloride: 104 mmol/L (ref 101–111)
GFR calc Af Amer: >60 mL/min (ref >60)
GFR calc non Af Amer: >60 mL/min (ref >60)
GLUCOSE: 97 mg/dL (ref 65–99)
Potassium: 3.5 mmol/L (ref 3.5–5.1)
Sodium: 138 mmol/L (ref 135–145)

## 2016-03-02 LAB — TSH: TSH: 1.883 u[IU]/mL (ref 0.350–4.500)

## 2016-03-06 ENCOUNTER — Other Ambulatory Visit: Payer: Self-pay | Admitting: Family Medicine

## 2016-03-07 ENCOUNTER — Other Ambulatory Visit: Payer: Self-pay | Admitting: *Deleted

## 2016-03-07 DIAGNOSIS — E878 Other disorders of electrolyte and fluid balance, not elsewhere classified: Secondary | ICD-10-CM

## 2016-03-07 DIAGNOSIS — I10 Essential (primary) hypertension: Secondary | ICD-10-CM

## 2016-03-09 LAB — METANEPHRINES, PLASMA
Metanephrine, Free: <10 pg/mL (ref 0–62)
NORMETANEPHRINE FREE: 30 pg/mL (ref 0–145)

## 2016-03-09 LAB — ALDOSTERONE + RENIN ACTIVITY W/ RATIO
ALDO / PRA Ratio: >79.6 — ABNORMAL HIGH (ref 0.0–30.0)
ALDOSTERONE: 13.3 ng/dL (ref 0.0–30.0)

## 2016-03-10 ENCOUNTER — Other Ambulatory Visit: Payer: Self-pay

## 2016-03-10 ENCOUNTER — Other Ambulatory Visit (INDEPENDENT_AMBULATORY_CARE_PROVIDER_SITE_OTHER): Payer: BLUE CROSS/BLUE SHIELD

## 2016-03-10 DIAGNOSIS — E269 Hyperaldosteronism, unspecified: Secondary | ICD-10-CM

## 2016-03-10 DIAGNOSIS — I10 Essential (primary) hypertension: Secondary | ICD-10-CM

## 2016-03-11 LAB — BASIC METABOLIC PANEL
BUN / CREAT RATIO: 11 (ref 9–23)
BUN: 11 mg/dL (ref 6–24)
CO2: 22 mmol/L (ref 18–29)
Calcium: 8.9 mg/dL (ref 8.7–10.2)
Chloride: 100 mmol/L (ref 96–106)
Creatinine, Ser: 1.01 mg/dL — ABNORMAL HIGH (ref 0.57–1.00)
GFR, EST AFRICAN AMERICAN: 79 mL/min/{1.73_m2} (ref >59)
GFR, EST NON AFRICAN AMERICAN: 69 mL/min/{1.73_m2} (ref >59)
Glucose: 111 mg/dL — ABNORMAL HIGH (ref 65–99)
POTASSIUM: 4.3 mmol/L (ref 3.5–5.2)
SODIUM: 142 mmol/L (ref 134–144)

## 2016-03-14 ENCOUNTER — Ambulatory Visit: Payer: BLUE CROSS/BLUE SHIELD | Admitting: Family Medicine

## 2016-03-16 ENCOUNTER — Ambulatory Visit (INDEPENDENT_AMBULATORY_CARE_PROVIDER_SITE_OTHER): Payer: BLUE CROSS/BLUE SHIELD | Admitting: Family Medicine

## 2016-03-16 ENCOUNTER — Encounter: Payer: Self-pay | Admitting: Family Medicine

## 2016-03-16 VITALS — BP 152/100 | HR 82 | Temp 98.4°F | Resp 17 | Ht 66.0 in | Wt 214.8 lb

## 2016-03-16 DIAGNOSIS — I10 Essential (primary) hypertension: Secondary | ICD-10-CM

## 2016-03-16 DIAGNOSIS — N644 Mastodynia: Secondary | ICD-10-CM | POA: Diagnosis not present

## 2016-03-16 MED ORDER — OLMESARTAN MEDOXOMIL 40 MG PO TABS: 40.0000 mg | ORAL_TABLET | Freq: Every day | ORAL | Status: DC

## 2016-03-16 NOTE — Progress Notes (Signed)
Name: Amanda Fields   MRN: [id number]    DOB: January 07, 1976   Date:03/16/2016       Progress Note  Subjective  Chief Complaint  Chief Complaint  Patient presents with  . Follow-up    Discuss labs    HPI  Pt. Returns for evaluation of lab work obtained at Cardiology office. Lab work for secondary hypertension drawn there showed an elevated Aldosterone/PRA ratio, normal serum potassium. Dr. Yvone Neu recommended to repeat serum potassium in 1 week by PCP. Pt. Presents today for further assessment. Pt. Also reports experiencing intermittent pain underneath her right breast, present for 2-3 weeks, no nipple discharge or palpable mass. No history of breast CA in self or family.   Past Medical History  Diagnosis Date  . Hypertension     a. echo 03/2012 EF 60-65%, moderate LVH, nl LV diastolic fxn, nl PASP; b. echo 12/2015: EF 55-60%  . Edema leg   . Dizziness   . Migraine headache   . Asthma   . Palpitation   . Endometriosis   . 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 Obesity   . Cough due to ACE inhibitor     Past Surgical History  Procedure Laterality Date  . Tonsillectomy    . Wisdom tooth extraction    . Laparoscopic supracervical hysterectomy  09/27/2010    For endometriosis. Ovaries anf tubes are in place.  . Tubal ligation  2006    Family History  Problem Relation Age of Onset  . COPD Mother   . Hypotension Mother   . Colon cancer Father   . Emphysema Father   . Cancer Father     colon  . Hyperlipidemia Sister   . Heart failure Paternal Uncle   . Heart attack Cousin 32  . Heart disease Cousin     heart attack    Social History   Social History  . Marital Status: Single    Spouse Name: N/A  . Number of Children: 2  . Years of Education: N/A   Occupational History  . call center    Social History Main Topics  . Smoking status: Never Smoker   . Smokeless tobacco: Never Used  . Alcohol Use: No     Comment: social  . Drug Use: No  . Sexual Activity:    Partners:  Male    Birth Control/ Protection: Surgical   Other Topics Concern  . Not on file   Social History Narrative     Current outpatient prescriptions:  .  ampicillin (PRINCIPEN) 500 MG capsule, Take 500 mg by mouth daily. , Disp: , Rfl: 3 .  aspirin EC 81 MG EC tablet, Take 1 tablet (81 mg total) by mouth daily., Disp: 30 tablet, Rfl: 0 .  escitalopram (LEXAPRO) 20 MG tablet, Take 1 tablet (20 mg total) by mouth daily., Disp: 31 tablet, Rfl: 12 .  estradiol (VIVELLE-DOT) 0.025 MG/24HR, Place 1 patch onto the skin 2 (two) times a week., Disp: 8 patch, Rfl: 12 .  metoprolol succinate (TOPROL-XL) 50 MG 24 hr tablet, Take 1 tablet (50 mg total) by mouth daily., Disp: 30 tablet, Rfl: 3 .  multivitamin-iron-minerals-folic acid (CENTRUM) chewable tablet, Chew 1 tablet by mouth daily.  , Disp: , Rfl:  .  olmesartan (BENICAR) 40 MG tablet, Take 1 tablet (40 mg total) by mouth daily., Disp: 30 tablet, Rfl: 2 .  [DISCONTINUED] fluticasone (FLONASE) 50 MCG/ACT nasal spray, Place 2 sprays into the nose as needed.  , Disp: , Rfl:  .  [  DISCONTINUED] lisinopril-hydrochlorothiazide (PRINZIDE) 20-12.5 MG per tablet, Take 1 tablet by mouth daily., Disp: 30 tablet, Rfl: 6 .  [DISCONTINUED] torsemide (DEMADEX) 10 MG tablet, Take 10 mg by mouth daily.  , Disp: , Rfl:   Allergies  Allergen Reactions  . Ace Inhibitors Cough  . Latex Itching    gloves     Review of Systems  Eyes: Negative for blurred vision.  Cardiovascular: Positive for palpitations (infrequent palpitations, followed by Cardiolgoy.). Negative for chest pain.  Neurological: Negative for headaches.     Objective  Filed Vitals:   03/16/16 0835 03/16/16 0937  BP: 148/91 152/100  Pulse: 82   Temp: 98.4 F (36.9 C)   TempSrc: Oral   Resp: 17   Height: 5' 6"  (1.676 m)   Weight: 214 lb 12.8 oz (97.433 kg)   SpO2: 98%     Physical Exam  Constitutional: She is well-developed, well-nourished, and in no distress.  Cardiovascular:  Normal rate and regular rhythm.   Pulmonary/Chest: Effort normal and breath sounds normal. Right breast exhibits no mass, no nipple discharge, no skin change and no tenderness.  No palpable mass or tenderness in the area of concern in the right breast,   Abdominal: Soft. Bowel sounds are normal.  Musculoskeletal:       Right ankle: She exhibits swelling.       Left ankle: She exhibits swelling.  1+ bilateral pitting edema.  Nursing note and vitals reviewed.     Assessment & Plan  1. Breast pain, right No definitive mass palpated. Obtain diagnostic mammogram for further assessment. - MM Digital Diagnostic Unilat R; Future  2. Essential hypertension Lab work reviewed, patient is being referred to endocrinology for further management of possible hyperaldosteronism. She has been started on Benicar 1 mg by nephrology. We'll increase to 40 mg for poorly controlled blood pressure. Recheck in 6 weeks. - Basic Metabolic Panel (BMET) - olmesartan (BENICAR) 40 MG tablet; Take 1 tablet (40 mg total) by mouth daily.  Dispense: 30 tablet; Refill: 2    Earlyne Feeser Asad A. [facility] 03/16/2016 6:14 PM

## 2016-03-17 ENCOUNTER — Other Ambulatory Visit: Payer: Self-pay

## 2016-03-17 LAB — BASIC METABOLIC PANEL
BUN / CREAT RATIO: 11 (ref 9–23)
BUN: 11 mg/dL (ref 6–24)
CHLORIDE: 101 mmol/L (ref 96–106)
CO2: 25 mmol/L (ref 18–29)
Calcium: 9.4 mg/dL (ref 8.7–10.2)
Creatinine, Ser: 0.98 mg/dL (ref 0.57–1.00)
GFR calc non Af Amer: 71 mL/min/{1.73_m2} (ref >59)
GFR, EST AFRICAN AMERICAN: 82 mL/min/{1.73_m2} (ref >59)
GLUCOSE: 89 mg/dL (ref 65–99)
POTASSIUM: 4.4 mmol/L (ref 3.5–5.2)
SODIUM: 143 mmol/L (ref 134–144)

## 2016-03-17 MED ORDER — METOPROLOL SUCCINATE ER 50 MG PO TB24: 50.0000 mg | ORAL_TABLET | Freq: Every day | ORAL | Status: DC

## 2016-03-17 NOTE — Telephone Encounter (Signed)
Refill sent for Metoprolol succ 50 mg

## 2016-03-20 ENCOUNTER — Other Ambulatory Visit: Payer: Self-pay | Admitting: *Deleted

## 2016-03-20 MED ORDER — METOPROLOL SUCCINATE ER 50 MG PO TB24: 50.0000 mg | ORAL_TABLET | Freq: Every day | ORAL | Status: DC

## 2016-04-04 ENCOUNTER — Other Ambulatory Visit: Payer: Self-pay | Admitting: *Deleted

## 2016-04-04 NOTE — Telephone Encounter (Signed)
Received a fax from [facility] requesting a refill for escitalopram.  Patient is a [facility] patient , will forward to Allie Dimmer and [place], Therapist, sports

## 2016-04-04 NOTE — Telephone Encounter (Signed)
Prescription was filled on 10-05-15 with 12 refills, spoke to pharmacy and stated rx still had refill remaining, not sure why we received a refill request.  Will fill prescription for pt.

## 2016-04-11 ENCOUNTER — Encounter: Payer: Self-pay | Admitting: Endocrinology

## 2016-04-11 ENCOUNTER — Ambulatory Visit (INDEPENDENT_AMBULATORY_CARE_PROVIDER_SITE_OTHER): Payer: BLUE CROSS/BLUE SHIELD | Admitting: Endocrinology

## 2016-04-11 VITALS — BP 136/92 | HR 72 | Temp 98.3°F | Ht 66.0 in | Wt 222.0 lb

## 2016-04-11 DIAGNOSIS — E2609 Other primary hyperaldosteronism: Secondary | ICD-10-CM | POA: Insufficient documentation

## 2016-04-11 NOTE — Progress Notes (Signed)
Subjective:    Patient ID: Amanda Fields, female    DOB: Sep 20, 1976, 40 y.o.   MRN: [id number]  HPI Pt was dx'ed with HTN in 2005.  She now has slight swelling of the legs, and assoc DOE.  She says she was rx'ed with [facility] in the past, but not recently.  She has been on 2 meds for HTN+ lasix for several years.  norvasc and HCTZ were stopped, due to lack of effect.  She has had TAH.   Past Medical History  Diagnosis Date  . Hypertension     a. echo 03/2012 EF 60-65%, moderate LVH, nl LV diastolic fxn, nl PASP; b. echo 12/2015: EF 55-60%  . Edema leg   . Dizziness   . Migraine headache   . Asthma   . Palpitation   . Endometriosis   . 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 Obesity   . Cough due to ACE inhibitor     Past Surgical History  Procedure Laterality Date  . Tonsillectomy    . Wisdom tooth extraction    . Laparoscopic supracervical hysterectomy  09/27/2010    For endometriosis. Ovaries anf tubes are in place.  . Tubal ligation  2006    Social History   Social History  . Marital Status: Single    Spouse Name: N/A  . Number of Children: 2  . Years of Education: N/A   Occupational History  . call center    Social History Main Topics  . Smoking status: Never Smoker   . Smokeless tobacco: Never Used  . Alcohol Use: No     Comment: social  . Drug Use: No  . Sexual Activity:    Partners: Male    Birth Control/ Protection: Surgical   Other Topics Concern  . Not on file   Social History Narrative    Current Outpatient Prescriptions on File Prior to Visit  Medication Sig Dispense Refill  . ampicillin (PRINCIPEN) 500 MG capsule Take 500 mg by mouth daily.   3  . aspirin EC 81 MG EC tablet Take 1 tablet (81 mg total) by mouth daily. 30 tablet 0  . escitalopram (LEXAPRO) 20 MG tablet Take 1 tablet (20 mg total) by mouth daily. 31 tablet 12  . estradiol (VIVELLE-DOT) 0.025 MG/24HR Place 1 patch onto the skin 2 (two) times a week. 8 patch 12  . metoprolol succinate  (TOPROL-XL) 50 MG 24 hr tablet Take 1 tablet (50 mg total) by mouth daily. 90 tablet 3  . multivitamin-iron-minerals-folic acid (CENTRUM) chewable tablet Chew 1 tablet by mouth daily.      Marland Kitchen olmesartan (BENICAR) 40 MG tablet Take 1 tablet (40 mg total) by mouth daily. 30 tablet 2  . [DISCONTINUED] fluticasone (FLONASE) 50 MCG/ACT nasal spray Place 2 sprays into the nose as needed.      . [DISCONTINUED] lisinopril-hydrochlorothiazide (PRINZIDE) 20-12.5 MG per tablet Take 1 tablet by mouth daily. 30 tablet 6  . [DISCONTINUED] torsemide (DEMADEX) 10 MG tablet Take 10 mg by mouth daily.       No current facility-administered medications on file prior to visit.    Allergies  Allergen Reactions  . Ace Inhibitors Cough  . Latex Itching    gloves    Family History  Problem Relation Age of Onset  . COPD Mother   . Hypotension Mother   . Colon cancer Father   . Emphysema Father   . Cancer Father     colon  . Hyperlipidemia Sister   .  Heart failure Paternal Uncle   . Heart attack Cousin 32  . Heart disease Cousin     heart attack  . Adrenal disorder Neg Hx     BP 136/92 mmHg  Pulse 72  Temp(Src) 98.3 F (36.8 C) (Oral)  Ht 5' 6"  (1.676 m)  Wt 222 lb (100.699 kg)  BMI 35.85 kg/m2  SpO2 96%  Review of Systems Denies numbness, visual loss, hearing loss, palpitations, n/v, polyuria, open wound, depression, cold intolerance, and headache.  She has intermittent leg cramps, low back pain, rhinorrhea, and weight gain.      Objective:   Physical Exam VS: see vs page GEN: no distress HEAD: head: no deformity eyes: no periorbital swelling, no proptosis external nose and ears are normal mouth: no lesion seen NECK: supple, thyroid is not enlarged CHEST WALL: no deformity.  LUNGS: clear to auscultation CV: reg rate and rhythm, no murmur ABD: abdomen is soft, nontender.  no hepatosplenomegaly.  not distended.  no hernia MUSCULOSKELETAL: muscle bulk and strength are grossly normal.  no  obvious joint swelling.  gait is normal and steady EXTEMITIES: no deformity.  Trace bilat leg edema.   PULSES: no carotid bruit NEURO:  cn 2-12 grossly intact.   readily moves all 4's.  sensation is intact to touch on all 4's SKIN:  Normal texture and temperature.  No rash or suspicious lesion is visible.   NODES:  None palpable at the neck PSYCH: alert, well-oriented.  Does not appear anxious nor depressed.     CT (2011): The adrenal glands are normal in appearance.  Lab Results  Component Value Date   CREATININE 0.98 03/16/2016   BUN 11 03/16/2016   NA 143 03/16/2016   K 4.4 03/16/2016   CL 101 03/16/2016   CO2 25 03/16/2016   Aldosterone>80 Renin<2    Assessment & Plan:  Primary hyperaldosteronism, new to me.   Patient is advised the following: Patient Instructions  Let's recheck the CT scan.  you will receive a phone call, about a day and time for an appointment. Based on the results, you may need "adrenal vein sampling."  I'll let you know. We can also suppress the aldosterone with medication. Our goals will be normal aldosterone, potassium, and blood pressure. Please continue the same medications for now.

## 2016-04-11 NOTE — Patient Instructions (Addendum)
Let's recheck the CT scan.  you will receive a phone call, about a day and time for an appointment. Based on the results, you may need "adrenal vein sampling."  I'll let you know. We can also suppress the aldosterone with medication. Our goals will be normal aldosterone, potassium, and blood pressure. Please continue the same medications for now.

## 2016-04-19 ENCOUNTER — Telehealth: Payer: Self-pay | Admitting: Endocrinology

## 2016-04-19 ENCOUNTER — Other Ambulatory Visit: Payer: Self-pay | Admitting: Endocrinology

## 2016-04-19 DIAGNOSIS — E2609 Other primary hyperaldosteronism: Secondary | ICD-10-CM

## 2016-04-20 ENCOUNTER — Ambulatory Visit
Admission: RE | Admit: 2016-04-20 | Discharge: 2016-04-20 | Disposition: A | Discharge status: Home / Self Care | Payer: BLUE CROSS/BLUE SHIELD | Source: Ambulatory Visit | Attending: Endocrinology | Admitting: Endocrinology

## 2016-04-20 DIAGNOSIS — E2609 Other primary hyperaldosteronism: Secondary | ICD-10-CM

## 2016-04-20 NOTE — Telephone Encounter (Signed)
error 

## 2016-04-26 ENCOUNTER — Ambulatory Visit: Payer: BLUE CROSS/BLUE SHIELD | Admitting: Family Medicine

## 2016-04-27 ENCOUNTER — Ambulatory Visit: Payer: BLUE CROSS/BLUE SHIELD | Admitting: Family Medicine

## 2016-04-28 ENCOUNTER — Telehealth: Payer: Self-pay | Admitting: Endocrinology

## 2016-04-28 DIAGNOSIS — E2609 Other primary hyperaldosteronism: Secondary | ICD-10-CM

## 2016-04-28 NOTE — Telephone Encounter (Signed)
Ok, i requested.  you will receive a phone call, about a day and time for an appointment

## 2016-04-28 NOTE — Telephone Encounter (Signed)
Patient stated that she would like to do the blood sampling test instead if the pill. please advise

## 2016-04-28 NOTE — Telephone Encounter (Signed)
I contacted the pt and advised of note below. Pt voiced understanding.  

## 2016-04-28 NOTE — Telephone Encounter (Signed)
See note below and please advise, Thanks! 

## 2016-05-09 ENCOUNTER — Other Ambulatory Visit: Payer: Self-pay | Admitting: Endocrinology

## 2016-05-12 ENCOUNTER — Other Ambulatory Visit (HOSPITAL_COMMUNITY): Payer: Self-pay | Admitting: Interventional Radiology

## 2016-05-12 ENCOUNTER — Telehealth: Payer: Self-pay | Admitting: Endocrinology

## 2016-05-12 ENCOUNTER — Telehealth (HOSPITAL_COMMUNITY): Payer: Self-pay | Admitting: Interventional Radiology

## 2016-05-12 DIAGNOSIS — E2609 Other primary hyperaldosteronism: Secondary | ICD-10-CM

## 2016-05-12 MED ORDER — SPIRONOLACTONE 25 MG PO TABS: 25.0000 mg | ORAL_TABLET | Freq: Every day | ORAL | Status: DC

## 2016-05-12 NOTE — Telephone Encounter (Signed)
Called pt, left VM for her to call to schedule her venous sampling procedure. JM

## 2016-05-12 NOTE — Telephone Encounter (Signed)
Please read message below.  

## 2016-05-12 NOTE — Telephone Encounter (Signed)
Left message for Amanda Fields that prescription has been sent to her pharmacy and to follow up with Dr. Loanne Drilling in 2 weeks.

## 2016-05-12 NOTE — Telephone Encounter (Signed)
Pt has decided to take the pill option

## 2016-05-12 NOTE — Telephone Encounter (Signed)
Ok, i have sent a prescription to your pharmacy Please come back for a follow-up appointment in 2 weeks

## 2016-06-01 ENCOUNTER — Ambulatory Visit: Payer: BLUE CROSS/BLUE SHIELD | Admitting: Cardiology

## 2016-06-01 ENCOUNTER — Encounter: Payer: Self-pay | Admitting: *Deleted

## 2016-06-06 ENCOUNTER — Telehealth (HOSPITAL_COMMUNITY): Payer: Self-pay | Admitting: Interventional Radiology

## 2016-06-06 NOTE — Telephone Encounter (Signed)
Called pt to schedule her venous sampling, left VM. JM

## 2016-06-29 ENCOUNTER — Telehealth (HOSPITAL_COMMUNITY): Payer: Self-pay | Admitting: Interventional Radiology

## 2016-06-29 NOTE — Telephone Encounter (Signed)
Have called pt multiple times to schedule venous sampling, no answer and no VM. JM

## 2016-06-30 ENCOUNTER — Telehealth (HOSPITAL_COMMUNITY): Payer: Self-pay

## 2016-06-30 NOTE — Telephone Encounter (Signed)
Called to schedule venous sampling, no answer, no vm. AW

## 2016-07-06 ENCOUNTER — Other Ambulatory Visit: Payer: Self-pay | Admitting: Radiology

## 2016-07-06 ENCOUNTER — Other Ambulatory Visit: Payer: Self-pay | Admitting: General Surgery

## 2016-07-06 MED ORDER — COSYNTROPIN 0.25 MG IJ SOLR: 0.0500 mg | Freq: Once | INTRAMUSCULAR | Status: DC

## 2016-07-07 ENCOUNTER — Other Ambulatory Visit (HOSPITAL_COMMUNITY): Payer: Self-pay | Admitting: Interventional Radiology

## 2016-07-07 ENCOUNTER — Encounter (HOSPITAL_COMMUNITY): Payer: Self-pay

## 2016-07-07 ENCOUNTER — Other Ambulatory Visit: Payer: Self-pay | Admitting: Endocrinology

## 2016-07-07 ENCOUNTER — Ambulatory Visit (HOSPITAL_COMMUNITY)
Admission: RE | Admit: 2016-07-07 | Discharge: 2016-07-07 | Disposition: A | Discharge status: Home / Self Care | Payer: Medicaid Other | Source: Ambulatory Visit | Attending: Interventional Radiology | Admitting: Interventional Radiology

## 2016-07-07 DIAGNOSIS — J45909 Unspecified asthma, uncomplicated: Secondary | ICD-10-CM | POA: Diagnosis not present

## 2016-07-07 DIAGNOSIS — Z800 Family history of malignant neoplasm of digestive organs: Secondary | ICD-10-CM | POA: Insufficient documentation

## 2016-07-07 DIAGNOSIS — Z6834 Body mass index (BMI) 34.0-34.9, adult: Secondary | ICD-10-CM | POA: Insufficient documentation

## 2016-07-07 DIAGNOSIS — E2609 Other primary hyperaldosteronism: Secondary | ICD-10-CM

## 2016-07-07 DIAGNOSIS — E269 Hyperaldosteronism, unspecified: Secondary | ICD-10-CM | POA: Diagnosis present

## 2016-07-07 DIAGNOSIS — E878 Other disorders of electrolyte and fluid balance, not elsewhere classified: Secondary | ICD-10-CM | POA: Diagnosis not present

## 2016-07-07 DIAGNOSIS — Z7982 Long term (current) use of aspirin: Secondary | ICD-10-CM | POA: Insufficient documentation

## 2016-07-07 DIAGNOSIS — I10 Essential (primary) hypertension: Secondary | ICD-10-CM | POA: Insufficient documentation

## 2016-07-07 DIAGNOSIS — G43909 Migraine, unspecified, not intractable, without status migrainosus: Secondary | ICD-10-CM | POA: Insufficient documentation

## 2016-07-07 DIAGNOSIS — E669 Obesity, unspecified: Secondary | ICD-10-CM | POA: Insufficient documentation

## 2016-07-07 HISTORY — PX: IR GENERIC HISTORICAL: IMG1180011

## 2016-07-07 LAB — BASIC METABOLIC PANEL
Anion gap: 7 (ref 5–15)
BUN: 10 mg/dL (ref 6–20)
CHLORIDE: 105 mmol/L (ref 101–111)
CO2: 26 mmol/L (ref 22–32)
CREATININE: 1.08 mg/dL — AB (ref 0.44–1.00)
Calcium: 9.2 mg/dL (ref 8.9–10.3)
GFR calc non Af Amer: >60 mL/min (ref >60)
GLUCOSE: 92 mg/dL (ref 65–99)
Potassium: 3.1 mmol/L — ABNORMAL LOW (ref 3.5–5.1)
Sodium: 138 mmol/L (ref 135–145)

## 2016-07-07 LAB — CBC
HCT: 39.4 % (ref 36.0–46.0)
Hemoglobin: 12.9 g/dL (ref 12.0–15.0)
MCH: 25.8 pg — AB (ref 26.0–34.0)
MCHC: 32.7 g/dL (ref 30.0–36.0)
MCV: 78.8 fL (ref 78.0–100.0)
PLATELETS: 290 10*3/uL (ref 150–400)
RBC: 5 MIL/uL (ref 3.87–5.11)
RDW: 15.1 % (ref 11.5–15.5)
WBC: 6.9 10*3/uL (ref 4.0–10.5)

## 2016-07-07 LAB — CORTISOL
CORTISOL PLASMA: 5.7 ug/dL
CORTISOL PLASMA: 7 ug/dL
CORTISOL PLASMA: 7.4 ug/dL
CORTISOL PLASMA: 17.2 ug/dL
CORTISOL PLASMA: 21.1 ug/dL
CORTISOL PLASMA: 99.4 ug/dL
Cortisol, Plasma: 15.1 ug/dL
Cortisol, Plasma: 5.8 ug/dL
Cortisol, Plasma: 25.5 ug/dL
Cortisol, Plasma: 55.3 ug/dL

## 2016-07-07 LAB — PROTIME-INR
INR: 1.14 (ref 0.00–1.49)
Prothrombin Time: 14.8 seconds (ref 11.6–15.2)

## 2016-07-07 LAB — APTT: aPTT: 29 seconds (ref 24–37)

## 2016-07-07 MED ORDER — IOPAMIDOL (ISOVUE-300) INJECTION 61%
INTRAVENOUS | Status: AC
  Filled 2016-07-07: qty 100

## 2016-07-07 MED ORDER — HYDROCODONE-ACETAMINOPHEN 5-325 MG PO TABS: 1.0000 | ORAL_TABLET | ORAL | Status: DC | PRN

## 2016-07-07 MED ORDER — MIDAZOLAM HCL 2 MG/2ML IJ SOLN
INTRAMUSCULAR | Status: AC
  Filled 2016-07-07: qty 4

## 2016-07-07 MED ORDER — LIDOCAINE HCL 1 % IJ SOLN
INTRAMUSCULAR | Status: AC
  Administered 2016-07-07: 10 mL
  Filled 2016-07-07: qty 20

## 2016-07-07 MED ORDER — FLUMAZENIL 0.5 MG/5ML IV SOLN
INTRAVENOUS | Status: AC
  Filled 2016-07-07: qty 5

## 2016-07-07 MED ORDER — FENTANYL CITRATE (PF) 100 MCG/2ML IJ SOLN
INTRAMUSCULAR | Status: AC
  Filled 2016-07-07: qty 4

## 2016-07-07 MED ORDER — IOPAMIDOL (ISOVUE-300) INJECTION 61%
INTRAVENOUS | Status: AC
  Administered 2016-07-07: 100 mL
  Filled 2016-07-07: qty 100

## 2016-07-07 MED ORDER — NALOXONE HCL 0.4 MG/ML IJ SOLN
INTRAMUSCULAR | Status: AC
  Filled 2016-07-07: qty 1

## 2016-07-07 MED ORDER — COSYNTROPIN NICU IV SYRINGE 0.25 MG/ML (STANDARD DOSE)
INTRAVENOUS | Status: AC | PRN
  Administered 2016-07-07 (×2): 50 ug via INTRAVENOUS

## 2016-07-07 MED ORDER — MIDAZOLAM HCL 2 MG/2ML IJ SOLN
INTRAMUSCULAR | Status: AC | PRN
  Administered 2016-07-07 (×4): .25 mg via INTRAVENOUS
  Administered 2016-07-07: 0.5 mg via INTRAVENOUS
  Administered 2016-07-07: .25 mg via INTRAVENOUS
  Administered 2016-07-07: 1 mg via INTRAVENOUS
  Administered 2016-07-07 (×3): 0.5 mg via INTRAVENOUS
  Administered 2016-07-07: .25 mg via INTRAVENOUS
  Administered 2016-07-07: 0.5 mg via INTRAVENOUS

## 2016-07-07 MED ORDER — DEXTROSE 5 % IV SOLN
250.0000 ug | INTRAVENOUS | Status: DC
  Filled 2016-07-07: qty 0.25

## 2016-07-07 MED ORDER — FENTANYL CITRATE (PF) 100 MCG/2ML IJ SOLN
INTRAMUSCULAR | Status: AC | PRN
  Administered 2016-07-07: 25 ug via INTRAVENOUS
  Administered 2016-07-07: 50 ug via INTRAVENOUS
  Administered 2016-07-07: 12.5 ug via INTRAVENOUS
  Administered 2016-07-07 (×2): 25 ug via INTRAVENOUS
  Administered 2016-07-07 (×3): 12.5 ug via INTRAVENOUS
  Administered 2016-07-07 (×2): 25 ug via INTRAVENOUS
  Administered 2016-07-07 (×2): 12.5 ug via INTRAVENOUS

## 2016-07-07 NOTE — Sedation Documentation (Signed)
Patient responding to voice; restless on the exam table. Patient states she is comfortable and in no discomfort or pain. Will continue to monitor. Roselyn Reef Joshue Badal,RN

## 2016-07-07 NOTE — Discharge Instructions (Signed)
Groin Surgical Site Care °Refer to this sheet in the next few weeks. These instructions provide you with information about caring for yourself after your procedure. Your health care provider may also give you more specific instructions. Your treatment has been planned according to current medical practices, but problems sometimes occur. Call your health care provider if you have any problems or questions after your procedure. °WHAT TO EXPECT AFTER THE PROCEDURE °After your procedure, it is typical to have the following: °· Bruising at the groin site that usually fades within 1-2 weeks. °· Blood collecting in the tissue (hematoma) that may be painful to the touch. It should usually decrease in size and tenderness within 1-2 weeks. °HOME CARE INSTRUCTIONS °· Take medicines only as directed by your health care provider. °· You may shower 24-48 hours after the procedure or as directed by your health care provider. Remove the bandage (dressing) and gently wash the site with plain soap and water. Pat the area dry with a clean towel. Do not rub the site, because this may cause bleeding. °· Do not take baths, swim, or use a hot tub until your health care provider approves. °· Check your insertion site every day for redness, swelling, or drainage. °· Do not apply powder or lotion to the site. °· Limit use of stairs to twice a day for the first 2-3 days or as directed by your health care provider. °· Do not squat for the first 2-3 days or as directed by your health care provider. °· Do not lift over 10 lb (4.5 kg) for 5 days after your procedure or as directed by your health care provider. °· Ask your health care provider when it is okay to: °¨ Return to work or school. °¨ Resume usual physical activities or sports. °¨ Resume sexual activity. °· Do not drive home if you are discharged the same day as the procedure. Have someone else drive you. °· You may drive 24 hours after the procedure unless otherwise instructed by your  health care provider. °· Do not operate machinery or power tools for 24 hours after the procedure or as directed by your health care provider. °· If your procedure was done as an outpatient procedure, which means that you went home the same day as your procedure, a responsible adult should be with you for the first 24 hours after you arrive home. °· Keep all follow-up visits as directed by your health care provider. This is important. °SEEK MEDICAL CARE IF: °· You have a fever. °· You have chills. °· You have increased bleeding from the groin site. Hold pressure on the site. °SEEK IMMEDIATE MEDICAL CARE IF: °· You have unusual pain at the groin site. °· You have redness, warmth, or swelling at the groin site. °· You have drainage (other than a small amount of blood on the dressing) from the groin site. °· The groin site is bleeding, and the bleeding does not stop after 30 minutes of holding steady pressure on the site. °· Your leg or foot becomes pale, cool, tingly, or numb. °  °This information is not intended to replace advice given to you by your health care provider. Make sure you discuss any questions you have with your health care provider. °  °Document Released: 08/07/2014 Document Reviewed: 08/07/2014 °Elsevier Interactive Patient Education ©2016 [employer] ° °

## 2016-07-07 NOTE — Sedation Documentation (Signed)
Patient is resting comfortably. 

## 2016-07-07 NOTE — Procedures (Signed)
Interventional Radiology Procedure Note  Procedure:  Bilateral adrenal venous sampling  Complications: None  Estimated Blood Loss: < 10 mL  Access:  Right CFV with 2 separate 5 Fr sheaths  Bilateral adrenal vein sampling performed prior to, and following, ACTH stimulation. See full dictated procedure notes. Cortisol and aldosterone levels drawn from peripheral vein prior to procedure. Suprarenal IVC, infrarenal IVC, right adrenal, left renal and left adrenal venous samples drawn prior to stimulation and after ACTH stimulation (50 mcg bolus followed by 50 mcg/hr drip for 30 min and during collection).  Venetia Night. Kathlene Cote, M.D Pager:  [phone]

## 2016-07-07 NOTE — H&P (Signed)
Chief Complaint: Patient was seen in consultation today for adrenal venous sampling at the request of Dr Renato Shin  Referring Physician(s): Dr Renato Shin  Supervising Physician: Aletta Edouard  Patient Status: Outpatient  History of Present Illness: Amanda Fields is a 40 y.o. female   Pt had been treated for hypertension in 2015 for approx 1 year with resolution She was scheduled for nasal surgery in January 2017 and was unable to proceed with surgery secondary to resistent hypertension  Work up discovered hyperaldosteronism Continued hypertension and weight gain Dr Renato Shin has requested adrenal venous sampling for evaluation Scheduled now for same   Past Medical History  Diagnosis Date  . Hypertension     a. echo 03/2012 EF 60-65%, moderate LVH, nl LV diastolic fxn, nl PASP; b. echo 12/2015: EF 55-60%  . Edema leg   . Dizziness   . Migraine headache   . Asthma   . Palpitation   . Endometriosis   . 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 Obesity   . Cough due to ACE inhibitor     Past Surgical History  Procedure Laterality Date  . Tonsillectomy    . Wisdom tooth extraction    . Laparoscopic supracervical hysterectomy  09/27/2010    For endometriosis. Ovaries anf tubes are in place.  . Tubal ligation  2006    Allergies: Ace inhibitors and Latex  Medications: Prior to Admission medications   Medication Sig Start Date End Date Taking? Authorizing Provider  estradiol (VIVELLE-DOT) 0.025 MG/24HR Place 1 patch onto the skin 2 (two) times a week. 10/12/15  Yes Collene Leyden Teague Clark, PA-C  furosemide (LASIX) 20 MG tablet Take 20 mg by mouth 2 (two) times daily.    Yes Historical Provider, MD  levocetirizine (XYZAL) 5 MG tablet Take 5 mg by mouth every evening.   Yes Historical Provider, MD  metoprolol succinate (TOPROL-XL) 50 MG 24 hr tablet Take 1 tablet (50 mg total) by mouth daily. 03/20/16  Yes Wende Bushy, MD  multivitamin-iron-minerals-folic acid (CENTRUM)  chewable tablet Chew 1 tablet by mouth daily.     Yes Historical Provider, MD  spironolactone (ALDACTONE) 25 MG tablet Take 1 tablet (25 mg total) by mouth daily. 05/12/16  Yes Renato Shin, MD  aspirin EC 81 MG EC tablet Take 1 tablet (81 mg total) by mouth daily. 01/01/16   Fritzi Mandes, MD  escitalopram (LEXAPRO) 20 MG tablet Take 1 tablet (20 mg total) by mouth daily. 10/05/15   Paticia Stack, PA-C     Family History  Problem Relation Age of Onset  . COPD Mother   . Hypotension Mother   . Colon cancer Father   . Emphysema Father   . Cancer Father     colon  . Hyperlipidemia Sister   . Heart failure Paternal Uncle   . Heart attack Cousin 32  . Heart disease Cousin     heart attack  . Adrenal disorder Neg Hx     Social History   Social History  . Marital Status: Single    Spouse Name: N/A  . Number of Children: 2  . Years of Education: N/A   Occupational History  . call center    Social History Main Topics  . Smoking status: Never Smoker   . Smokeless tobacco: Never Used  . Alcohol Use: No     Comment: social  . Drug Use: No  . Sexual Activity:    Partners: Male    Patent examiner  Protection: Surgical   Other Topics Concern  . None   Social History Narrative     Review of Systems: A 12 point ROS discussed and pertinent positives are indicated in the HPI above.  All other systems are negative.  Review of Systems  Constitutional: Positive for unexpected weight change. Negative for fever, activity change, appetite change and fatigue.  Eyes: Negative for visual disturbance.  Respiratory: Negative for cough and shortness of breath.   Cardiovascular: Negative for chest pain.  Gastrointestinal: Negative for abdominal pain.  Musculoskeletal: Negative for back pain.  Neurological: Negative for weakness.  Psychiatric/Behavioral: Negative for behavioral problems and confusion.    Vital Signs: BP 161/113 mmHg  Pulse 65  Temp(Src) 97.8 F (36.6 C) (Oral)   Resp 18  Ht 5' 6"  (1.676 m)  Wt 213 lb (96.616 kg)  BMI 34.40 kg/m2  SpO2 100%  Physical Exam  Constitutional: She is oriented to person, place, and time. She appears well-nourished.  Cardiovascular: Normal rate, regular rhythm and normal heart sounds.   Pulmonary/Chest: Effort normal and breath sounds normal.  Abdominal: Soft. Bowel sounds are normal.  Musculoskeletal: Normal range of motion.  Neurological: She is alert and oriented to person, place, and time.  Skin: Skin is warm and dry.  Psychiatric: She has a normal mood and affect. Her behavior is normal. Judgment and thought content normal.  Nursing note and vitals reviewed.   Mallampati Score:  MD Evaluation Airway: WNL Heart: WNL Abdomen: WNL Chest/ Lungs: WNL ASA  Classification: 2 Mallampati/Airway Score: One  Imaging: No results found.  Labs:  CBC:  Recent Labs  12/31/15 1857 01/04/16 1621 07/07/16 0720  WBC 11.8* 7.7 6.9  HGB 13.7  --  12.9  HCT 43.0 41.9 39.4  PLT 270 307 290    COAGS: No results for input(s): INR, APTT in the last 8760 hours.  BMP:  Recent Labs  03/02/16 0756 03/10/16 0839 03/16/16 1009 07/07/16 0720  NA 138 142 143 138  K 3.5 4.3 4.4 3.1*  CL 104 100 101 105  CO2 30 22 25 26   GLUCOSE 97 111* 89 92  BUN 12 11 11 10   CALCIUM 8.7* 8.9 9.4 9.2  CREATININE 1.00 1.01* 0.98 1.08*  GFRNONAA >60 69 71 >60  GFRAA >60 79 82 >60    LIVER FUNCTION TESTS:  Recent Labs  12/31/15 1857 01/04/16 1621 02/04/16 0930  BILITOT 0.4 <0.2 0.5  AST 26 29 65*  ALT 21 21 31   ALKPHOS 67 76 63  PROT 8.4* 7.6 7.3  ALBUMIN 4.4 4.3 4.0    TUMOR MARKERS: No results for input(s): AFPTM, CEA, CA199, CHROMGRNA in the last 8760 hours.  Assessment and Plan:  Resistant hypertension Weight gain Hyperaldosteronism Scheduled now for adrenal venous sampling Risks and Benefits discussed with the patient including, but not limited to bleeding, infection, vascular injury or contrast  induced renal failure. All of the patient's questions were answered, patient is agreeable to proceed. Consent signed and in chart.   Thank you for this interesting consult.  I greatly enjoyed meeting Amanda Fields and look forward to participating in their care.  A copy of this report was sent to the requesting provider on this date.  Electronically Signed: Monia Sabal A 07/07/2016, 8:08 AM    I spent a total of  30 Minutes   in face to face in clinical consultation, greater than 50% of which was counseling/coordinating care for adrenal venous sampling

## 2016-07-13 LAB — ALDOSTERONE
ALDOSTERONE: 6.1 ng/dL (ref 0.0–30.0)
ALDOSTERONE: 33.6 ng/dL — AB (ref 0.0–30.0)
Aldosterone: 8.7 ng/dL (ref 0.0–30.0)
Aldosterone: 7.9 ng/dL (ref 0.0–30.0)
Aldosterone: 8.1 ng/dL (ref 0.0–30.0)
Aldosterone: <1 ng/dL (ref 0.0–30.0)
Aldosterone: 248.6 ng/dL — ABNORMAL HIGH (ref 0.0–30.0)
Aldosterone: 131.9 ng/dL — ABNORMAL HIGH (ref 0.0–30.0)

## 2016-07-14 ENCOUNTER — Telehealth: Payer: Self-pay | Admitting: Endocrinology

## 2016-07-14 NOTE — Telephone Encounter (Signed)
PT calling requesting test results. Please call back.

## 2016-07-14 NOTE — Telephone Encounter (Signed)
There are numerous results.  These are being analyzed by the doctor who did the procedure.  When I get the results, i'll pass the results on to you.

## 2016-07-14 NOTE — Telephone Encounter (Signed)
Can you read her labs? Thanks.

## 2016-07-17 LAB — ALDOSTERONE
ALDOSTERONE: 3734.7 ng/dL — AB (ref 0.0–30.0)
Aldosterone: 503.4 ng/dL — ABNORMAL HIGH (ref 0.0–30.0)

## 2016-07-17 NOTE — Telephone Encounter (Signed)
Please call pt back.

## 2016-07-17 NOTE — Telephone Encounter (Signed)
Attempted to reach the pt. Pt was unavailable. Will try again at a later time.

## 2016-07-21 NOTE — Telephone Encounter (Signed)
Called and spoke with patient about her lab results, advised patient they were not in yet and we would call her when they came in. No questions or concerns at this time.

## 2016-07-21 NOTE — Telephone Encounter (Signed)
Patient returning your call.

## 2016-07-21 NOTE — Telephone Encounter (Signed)
Patient is returning your call.  

## 2016-07-21 NOTE — Telephone Encounter (Signed)
Attempted to reach the pt. Pt was unavailable and vm was full will try again at a later time.

## 2016-07-24 ENCOUNTER — Encounter (HOSPITAL_COMMUNITY): Payer: Self-pay | Admitting: Interventional Radiology

## 2016-07-25 ENCOUNTER — Encounter: Payer: Self-pay | Admitting: Family Medicine

## 2016-07-25 ENCOUNTER — Ambulatory Visit (INDEPENDENT_AMBULATORY_CARE_PROVIDER_SITE_OTHER): Payer: Medicaid Other | Admitting: Family Medicine

## 2016-07-25 VITALS — BP 171/118 | HR 69 | Resp 16 | Ht 66.75 in | Wt 221.0 lb

## 2016-07-25 DIAGNOSIS — B9689 Other specified bacterial agents as the cause of diseases classified elsewhere: Secondary | ICD-10-CM

## 2016-07-25 DIAGNOSIS — A499 Bacterial infection, unspecified: Secondary | ICD-10-CM | POA: Diagnosis not present

## 2016-07-25 DIAGNOSIS — N760 Acute vaginitis: Secondary | ICD-10-CM

## 2016-07-25 MED ORDER — BORIC ACID POWD: 5 refills | Status: DC

## 2016-07-25 MED ORDER — METRONIDAZOLE 500 MG PO TABS: 500.0000 mg | ORAL_TABLET | Freq: Two times a day (BID) | ORAL | 0 refills | Status: AC

## 2016-07-25 NOTE — Progress Notes (Signed)
   Subjective:    Patient ID: Amanda Fields is a 40 y.o. female presenting with Vaginal Itching  on 07/25/2016  HPI: Patient's partner changed condoms and now she has a vaginal odor. Has vaginal itching. Has h/o recurrent BV.  Review of Systems  Constitutional: Negative for chills and fever.  Respiratory: Negative for shortness of breath.   Cardiovascular: Negative for chest pain.  Gastrointestinal: Negative for abdominal pain, nausea and vomiting.  Genitourinary: Negative for dysuria.  Skin: Negative for rash.      Objective:    BP (!) 171/118   Pulse 69   Resp 16   Ht 5' 6.75" (1.695 m)   Wt 221 lb (100.2 kg)   BMI 34.87 kg/m  Physical Exam  Constitutional: She is oriented to person, place, and time. She appears well-developed and well-nourished. No distress.  HENT:  Head: Normocephalic and atraumatic.  Eyes: No scleral icterus.  Neck: Neck supple.  Cardiovascular: Normal rate.   Pulmonary/Chest: Effort normal.  Abdominal: Soft.  Genitourinary:  Genitourinary Comments: BUS normal, vagina is pink and rugated, cervix is nulliparous without lesion, uterus is small and anteverted, no adnexal mass or tenderness.   Neurological: She is alert and oriented to person, place, and time.  Skin: Skin is warm and dry.  Psychiatric: She has a normal mood and affect.        Assessment & Plan:   Problem List Items Addressed This Visit    None    Visit Diagnoses    Bacterial vaginosis    -  Primary   recurrent trial of hylafem--rx for boric acid sent in--Flagyl if needed.   Relevant Medications   metroNIDAZOLE (FLAGYL) 500 MG tablet   Boric Acid POWD   Other Relevant Orders   WET PREP BY MOLECULAR PROBE      Total face-to-face time with patient: 15 minutes. Over 50% of encounter was spent on counseling and coordination of care. Return if symptoms worsen or fail to improve.  Christifer Chapdelaine S 07/25/2016 3:10 PM

## 2016-07-25 NOTE — Patient Instructions (Signed)

## 2016-07-26 LAB — WET PREP BY MOLECULAR PROBE
Candida species: NEGATIVE
GARDNERELLA VAGINALIS: POSITIVE — AB
TRICHOMONAS VAG: NEGATIVE

## 2016-08-02 ENCOUNTER — Ambulatory Visit (INDEPENDENT_AMBULATORY_CARE_PROVIDER_SITE_OTHER): Payer: Medicaid Other | Admitting: Endocrinology

## 2016-08-02 ENCOUNTER — Encounter: Payer: Self-pay | Admitting: Endocrinology

## 2016-08-02 VITALS — BP 174/115 | HR 73 | Temp 98.3°F | Resp 16 | Ht 66.75 in | Wt 221.6 lb

## 2016-08-02 DIAGNOSIS — E2609 Other primary hyperaldosteronism: Secondary | ICD-10-CM | POA: Diagnosis not present

## 2016-08-02 LAB — BASIC METABOLIC PANEL
BUN: 12 mg/dL (ref 6–23)
CALCIUM: 9.3 mg/dL (ref 8.4–10.5)
CO2: 29 meq/L (ref 19–32)
CREATININE: 1.04 mg/dL (ref 0.40–1.20)
Chloride: 104 mEq/L (ref 96–112)
GFR: 74.55 mL/min (ref >60.00)
GLUCOSE: 131 mg/dL — AB (ref 70–99)
Potassium: 3.8 mEq/L (ref 3.5–5.1)
SODIUM: 138 meq/L (ref 135–145)

## 2016-08-02 MED ORDER — SPIRONOLACTONE 50 MG PO TABS: 50.0000 mg | ORAL_TABLET | Freq: Two times a day (BID) | ORAL | 11 refills | Status: DC

## 2016-08-02 NOTE — Patient Instructions (Addendum)
I have sent a prescription to your pharmacy, to increase the spironolactone Please see a surgery specialist.  you will receive a phone call, about a day and time for an appointment. Please come back here after the surgery.

## 2016-08-02 NOTE — Progress Notes (Signed)
Subjective:    Patient ID: Amanda Fields, female    DOB: 1976/04/29, 40 y.o.   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  She has been on 2 meds for HTN+ lasix for several years; She has had TAH.  pt states she feels well in general. Past Medical History:  Diagnosis Date  . Asthma   . 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 Cough due to ACE inhibitor   . Dizziness   . Edema leg   . Endometriosis   . Hypertension    a. echo 03/2012 EF 60-65%, moderate LVH, nl LV diastolic fxn, nl PASP; b. echo 12/2015: EF 55-60%  . Migraine headache   . Obesity   . Palpitation     Past Surgical History:  Procedure Laterality Date  . IR GENERIC HISTORICAL  07/07/2016   IR VENOGRAM RENAL UNI LEFT 07/07/2016 Aletta Edouard, MD [facility]  . IR GENERIC HISTORICAL  07/07/2016   IR VENOUS SAMPLING 07/07/2016 Aletta Edouard, MD [facility]  . IR GENERIC HISTORICAL  07/07/2016   IR VENOGRAM ADRENAL BI 07/07/2016 Aletta Edouard, MD [facility]  . IR GENERIC HISTORICAL  07/07/2016   IR US GUIDE VASC ACCESS RIGHT 07/07/2016 Aletta Edouard, MD [facility]  . IR GENERIC HISTORICAL  07/07/2016   IR ANGIOGRAM SELECTIVE EACH ADDITIONAL VESSEL 07/07/2016 Aletta Edouard, MD [facility]  . IR GENERIC HISTORICAL  07/07/2016   IR VENOUS SAMPLING 07/07/2016 Aletta Edouard, MD [facility]  . IR GENERIC HISTORICAL  07/07/2016   IR US GUIDE VASC ACCESS RIGHT 07/07/2016 Aletta Edouard, MD [facility]  . LAPAROSCOPIC SUPRACERVICAL HYSTERECTOMY  09/27/2010   For endometriosis. Ovaries anf tubes are in place.  . TONSILLECTOMY    . TUBAL LIGATION  2006  . WISDOM TOOTH EXTRACTION      Social History   Social History  . Marital status: Single    Spouse name: N/A  . Number of children: 2  . Years of education: N/A   Occupational History  . call center    Social History Main Topics  .  Smoking status: Never Smoker  . Smokeless tobacco: Never Used  . Alcohol use No     Comment: social  . Drug use: No  . Sexual activity: Yes    Partners: Male    Birth control/ protection: Surgical   Other Topics Concern  . Not on file   Social History Narrative  . No narrative on file    Current Outpatient Prescriptions on File Prior to Visit  Medication Sig Dispense Refill  . aspirin EC 81 MG EC tablet Take 1 tablet (81 mg total) by mouth daily. 30 tablet 0  . Boric Acid POWD Place in capsule in vagina every other week 454 g 5  . estradiol (VIVELLE-DOT) 0.025 MG/24HR Place 1 patch onto the skin 2 (two) times a week. 8 patch 12  . furosemide (LASIX) 20 MG tablet Take 20 mg by mouth 2 (two) times daily.     Marland Kitchen levocetirizine (XYZAL) 5 MG tablet Take 5 mg by mouth every evening.    . metoprolol succinate (TOPROL-XL) 50 MG 24 hr tablet Take 1 tablet (50 mg total) by mouth daily. 90 tablet 3  . multivitamin-iron-minerals-folic acid (CENTRUM) chewable tablet Chew 1 tablet by mouth daily.      . [DISCONTINUED] fluticasone (FLONASE) 50  MCG/ACT nasal spray Place 2 sprays into the nose as needed.      . [DISCONTINUED] lisinopril-hydrochlorothiazide (PRINZIDE) 20-12.5 MG per tablet Take 1 tablet by mouth daily. 30 tablet 6  . [DISCONTINUED] torsemide (DEMADEX) 10 MG tablet Take 10 mg by mouth daily.       Current Facility-Administered Medications on File Prior to Visit  Medication Dose Route Frequency Provider Last Rate Last Dose  . cosyntropin (CORTROSYN) injection 0.05 mg  0.05 mg Intravenous Once Monia Sabal, PA-C        Allergies  Allergen Reactions  . Ace Inhibitors Cough  . Latex Itching    gloves    Family History  Problem Relation Age of Onset  . COPD Mother   . Hypotension Mother   . Colon cancer Father   . Emphysema Father   . Cancer Father     colon  . Hyperlipidemia Sister   . Heart failure Paternal Uncle   . Heart attack Cousin 32  . Heart disease Cousin      heart attack  . Adrenal disorder Neg Hx     BP (!) 174/115 (BP Location: Left Arm, Patient Position: Sitting, Cuff Size: Large)   Pulse 73   Temp 98.3 F (36.8 C) (Oral)   Resp 16   Ht 5' 6.75" (1.695 m)   Wt 221 lb 9.6 oz (100.5 kg)   SpO2 98%   BMI 34.97 kg/m   Review of Systems Denies sob.      Objective:   Physical Exam  VITAL SIGNS:  See vs page GENERAL: no distress Trace bilat leg edema.       Assessment & Plan:  primary hyperaldosteronism: she needs increased rx.

## 2016-08-06 LAB — ALDOSTERONE + RENIN ACTIVITY W/ RATIO
ALDO / PRA RATIO: 10.1 ratio (ref 0.9–28.9)
ALDOSTERONE: 9 ng/dL
PRA LC/MS/MS: 0.89 ng/mL/h (ref 0.25–5.82)

## 2016-08-18 ENCOUNTER — Ambulatory Visit (INDEPENDENT_AMBULATORY_CARE_PROVIDER_SITE_OTHER): Payer: Medicaid Other | Admitting: Obstetrics and Gynecology

## 2016-08-18 ENCOUNTER — Other Ambulatory Visit (HOSPITAL_COMMUNITY)
Admission: RE | Admit: 2016-08-18 | Discharge: 2016-08-18 | Disposition: A | Discharge status: Home / Self Care | Payer: Medicaid Other | Source: Ambulatory Visit | Attending: Obstetrics and Gynecology | Admitting: Obstetrics and Gynecology

## 2016-08-18 ENCOUNTER — Encounter: Payer: Self-pay | Admitting: Obstetrics and Gynecology

## 2016-08-18 VITALS — BP 154/106 | HR 73 | Ht 66.75 in | Wt 216.0 lb

## 2016-08-18 DIAGNOSIS — Z113 Encounter for screening for infections with a predominantly sexual mode of transmission: Secondary | ICD-10-CM | POA: Diagnosis not present

## 2016-08-18 NOTE — Progress Notes (Signed)
Her today for STI screening.  Her partner told her he has " lymph channel inflamation" self diagnosed. Patient does have some vaginal itching, not change to discharge.

## 2016-08-18 NOTE — Progress Notes (Signed)
40 yo presenting today for STI testing. Patient reports onset of a pruritic discharge a few weeks ago. Her partner informed her that she needed to abstain from intercourse until his "lymph channel inflammation" healed. She is without any other symptoms. She denies abnormal bleeding, abdominal pain or pelvic pain  Past Medical History:  Diagnosis Date  . Asthma   . 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 Cough due to ACE inhibitor   . Dizziness   . Edema leg   . Endometriosis   . Hypertension    a. echo 03/2012 EF 60-65%, moderate LVH, nl LV diastolic fxn, nl PASP; b. echo 12/2015: EF 55-60%  . Migraine headache   . Obesity   . Palpitation    Past Surgical History:  Procedure Laterality Date  . IR GENERIC HISTORICAL  07/07/2016   IR VENOGRAM RENAL UNI LEFT 07/07/2016 Aletta Edouard, MD [facility]  . IR GENERIC HISTORICAL  07/07/2016   IR VENOUS SAMPLING 07/07/2016 Aletta Edouard, MD [facility]  . IR GENERIC HISTORICAL  07/07/2016   IR VENOGRAM ADRENAL BI 07/07/2016 Aletta Edouard, MD [facility]  . IR GENERIC HISTORICAL  07/07/2016   IR US GUIDE VASC ACCESS RIGHT 07/07/2016 Aletta Edouard, MD [facility]  . IR GENERIC HISTORICAL  07/07/2016   IR ANGIOGRAM SELECTIVE EACH ADDITIONAL VESSEL 07/07/2016 Aletta Edouard, MD [facility]  . IR GENERIC HISTORICAL  07/07/2016   IR VENOUS SAMPLING 07/07/2016 Aletta Edouard, MD [facility]  . IR GENERIC HISTORICAL  07/07/2016   IR US GUIDE VASC ACCESS RIGHT 07/07/2016 Aletta Edouard, MD [facility]  . LAPAROSCOPIC SUPRACERVICAL HYSTERECTOMY  09/27/2010   For endometriosis. Ovaries anf tubes are in place.  . TONSILLECTOMY    . TUBAL LIGATION  2006  . WISDOM TOOTH EXTRACTION     Family History  Problem Relation Age of Onset  . COPD Mother   . Hypotension Mother   . Colon cancer Father   . Emphysema Father   . Cancer Father     colon  . Hyperlipidemia Sister   . Heart failure Paternal Uncle   . Heart attack Cousin 32  . Heart disease Cousin    heart attack  . Adrenal disorder Neg Hx    Social History  Substance Use Topics  . Smoking status: Never Smoker  . Smokeless tobacco: Never Used  . Alcohol use No     Comment: social   ROS See pertinent in HPI  Blood pressure (!) 154/106, pulse 73, height 5' 6.75" (1.695 m), weight 216 lb (98 kg). GENERAL: Well-developed, well-nourished female in no acute distress.  ABDOMEN: Soft, nontender, nondistended. No organomegaly. PELVIC: Normal external female genitalia. Vagina is pink and rugated.  Normal discharge. Normal appearing cervix. Uterus is normal in size. No adnexal mass or tenderness. EXTREMITIES: No cyanosis, clubbing, or edema, 2+ distal pulses.  A/P 40 yo here for STI testing - Cultures and wet prep collected - HIV, RPR and Hep B ordered - Patient will be contacted with any abnormal results - Patient has had a normal mammogram with [facility] last year - RTC in October for annual exam

## 2016-08-19 LAB — RPR

## 2016-08-19 LAB — HEPATITIS B SURFACE ANTIGEN: HEP B S AG: NEGATIVE

## 2016-08-19 LAB — WET PREP, GENITAL
Trich, Wet Prep: NONE SEEN
Yeast Wet Prep HPF POC: NONE SEEN

## 2016-08-19 LAB — HEPATITIS C ANTIBODY: HCV Ab: NEGATIVE

## 2016-08-19 LAB — HIV ANTIBODY (ROUTINE TESTING W REFLEX): HIV 1&2 Ab, 4th Generation: NONREACTIVE

## 2016-08-22 ENCOUNTER — Telehealth: Payer: Self-pay | Admitting: *Deleted

## 2016-08-22 ENCOUNTER — Ambulatory Visit: Payer: Self-pay | Admitting: Surgery

## 2016-08-22 MED ORDER — METRONIDAZOLE 500 MG PO TABS: 500.0000 mg | ORAL_TABLET | Freq: Two times a day (BID) | ORAL | 0 refills | Status: DC

## 2016-08-22 NOTE — H&P (Signed)
[facility] 08/22/2016 9:33 AM Location: [facility] Patient #: [id number] DOB: May 01, 1976 Single / Language: Amanda Fields / Race: Black or African American Female  History of Present Illness Adin Hector MD; 08/22/2016 10:50 AM) The patient is a 40 year old female who presents wtih an adrenal mass. Note for "Adrenal mass": Patient sent for surgical consultation by Dr Landry Mellow for concern of hyperaldosteronism. Venous sampling suggested left-sided etiology despite no mass on CT  Pleasant active female. Diagnosed with hypertension after delivering her last child in 2006 age 84. Was due to get elective deviated septum surgery, but she was found to have much worse hypertension. Had to take much more blood pressure medications. Actually evaluated in the emergency room with elevated troponin and cardiac strain. Workup done showing elevated aldosterone level. No adrenal mass seen on CT scan. Venous sampling strongly favors left adrenal side.   Aldosterone levels under better control. Blood pressure under better control on spironolactone, potassium supplementation, & BP meds. However, given the unilateral etiology, her endocrinologist recommended unilateral adrenalectomy to help deal with the hyperaldosteronism. Patient wishes to have surgery as a chance to cure the problem and get off the many blood pressure medicines. No from family history of blood pressure issues. She normally walk couple miles without difficulty. She had abdominal hysterectomy in 2011 without incident. Moves her bowels about every day. She does not smoke.     ADDENDUM REPORT: 07/24/2016 10:34  ADDENDUM: Results from the adrenal venous sampling of are tabulated below:  Serum (0720 hours): Aldosterone 8.7 ng/dL, Cortisol 15.1 mcg/dL  Pre-Stimulation:  Infrarenal IVC: Aldosterone 8.1, Cortisol 7.4  Suprarenal IVC: Aldosterone 7.9, Cortisol 7.0  Left Renal Vein: Aldosterone 6.1, Cortisol  5.8  Right Adrenal Vein: Aldosterone < 1, Cortisol 5.7  Left Adrenal Vein: Aldosterone 503.4, Cortisol 17.2  Post-Stimulation:  Infrarenal IVC: Aldosterone 33.6, Cortisol 25.5  Suprarenal IVC: Aldosterone 131.9, Cortisol 55.3  Left Renal Vein: Aldosterone 248.6, Cortisol 99.4  Right Adrenal Vein: Aldosterone < 1, Cortisol 21.1  Left Adrenal Vein: Aldosterone 3,734.7, Cortisol >100  Both pre and post stimulation sampling clearly show lateralization of hyperaldosteronism and higher cortisol production to the left adrenal gland compared to the right.   Electronically Signed By: Aletta Edouard M.D. On: 07/24/2016 10:34   Other Problems Sander Nephew, CMA; 08/22/2016 9:53 AM) Asthma Hemorrhoids High blood pressure Migraine Headache Sleep Apnea  Past Surgical History Sander Nephew, [place]; 08/22/2016 9:53 AM) Hysterectomy (not due to cancer) - Partial Tonsillectomy  Diagnostic Studies History Sander Nephew, CMA; 08/22/2016 9:53 AM) Colonoscopy never Mammogram within last year Pap Smear 1-5 years ago  Allergies Sander Nephew, CMA; 08/22/2016 9:57 AM) Latex Iodinated Contrast Media ACE Inhibitors  Medication History Sander Nephew, CMA; 08/22/2016 9:58 AM) Ampicillin (500MG Capsule, Oral) Active. Escitalopram Oxalate (20MG Tablet, Oral) Active. Estradiol (0.025MG/24HR Patch TW, Transdermal) Active. Furosemide (20MG Tablet, Oral) Active. Metoprolol Succinate ER (50MG Tablet ER 24HR, Oral) Active. MetroNIDAZOLE (500MG Tablet, Oral) Active. Olmesartan Medoxomil (20MG Tablet, Oral) Active. Olmesartan Medoxomil (40MG Tablet, Oral) Active. Spironolactone (25MG Tablet, Oral) Active. Spironolactone (50MG Tablet, Oral) Active. Medications Reconciled  Social History Sander Nephew, [place]; 08/22/2016 9:53 AM) Alcohol use Occasional alcohol use. Caffeine use Carbonated beverages, Tea. No drug use Tobacco use Never  smoker.  Family History Sander Nephew, [place]; 08/22/2016 9:53 AM) Alcohol Abuse Father. Cancer Sister. Colon Cancer Father. Hypertension Mother. Kidney Disease Daughter. Respiratory Condition Mother.  Pregnancy / Birth History Sander Nephew, [place]; 08/22/2016 9:53 AM) Age at menarche 39 years. Age of  menopause <43 Contraceptive History Depo-provera, Oral contraceptives. Gravida 2 Irregular periods Length (months) of breastfeeding 3-6 Maternal age 83-25 Para 2     Review of Systems Sander Nephew CMA; 08/22/2016 9:54 AM) General Present- Fatigue, Night Sweats and Weight Gain. Not Present- Appetite Loss, Chills, Fever and Weight Loss. HEENT Present- Seasonal Allergies, Sinus Pain and Wears glasses/contact lenses. Not Present- Earache, Hearing Loss, Hoarseness, Nose Bleed, Oral Ulcers, Ringing in the Ears, Sore Throat, Visual Disturbances and Yellow Eyes. Respiratory Present- Snoring. Not Present- Bloody sputum, Chronic Cough, Difficulty Breathing and Wheezing. Breast Not Present- Breast Mass, Breast Pain, Nipple Discharge and Skin Changes. Cardiovascular Present- Leg Cramps and Swelling of Extremities. Not Present- Chest Pain, Difficulty Breathing Lying Down, Palpitations, Rapid Heart Rate and Shortness of Breath. Gastrointestinal Present- Hemorrhoids. Not Present- Abdominal Pain, Bloating, Bloody Stool, Change in Bowel Habits, Chronic diarrhea, Constipation, Difficulty Swallowing, Excessive gas, Gets full quickly at meals, Indigestion, Nausea, Rectal Pain and Vomiting. Female Genitourinary Present- Pelvic Pain. Not Present- Frequency, Nocturia, Painful Urination and Urgency. Musculoskeletal Present- Back Pain, Joint Pain, Joint Stiffness, Muscle Pain, Muscle Weakness and Swelling of Extremities. Neurological Present- Decreased Memory and Headaches. Not Present- Fainting, Numbness, Seizures, Tingling, Tremor, Trouble walking and Weakness. Psychiatric Present- Change in  Sleep Pattern. Not Present- Anxiety, Bipolar, Depression, Fearful and Frequent crying. Endocrine Present- Hot flashes. Not Present- Cold Intolerance, Excessive Hunger, Hair Changes, Heat Intolerance and New Diabetes. Hematology Not Present- Blood Thinners, Easy Bruising, Excessive bleeding, Gland problems, HIV and Persistent Infections.  Vitals Georgia Lopes Albanese CMA; 08/22/2016 9:55 AM) 08/22/2016 9:54 AM Weight: 215.6 lb Height: 66in Body Surface Area: 2.07 m Body Mass Index: 34.8 kg/m  Temp.: 98.33F(Oral)  Pulse: 76 (Regular)  Resp.: 18 (Unlabored)  P.OX: 96% (Room air) BP: 138/78 (Sitting, Left Arm, Standard)      Physical Exam Adin Hector MD; 08/22/2016 10:30 AM)  General Mental Status-Alert. General Appearance-Not in acute distress, Not Sickly. Orientation-Oriented X3. Hydration-Well hydrated. Voice-Normal.  Integumentary Global Assessment Upon inspection and palpation of skin surfaces of the - Axillae: non-tender, no inflammation or ulceration, no drainage. and Distribution of scalp and body hair is normal. General Characteristics Temperature - normal warmth is noted.  Head and Neck Head-normocephalic, atraumatic with no lesions or palpable masses. Face Global Assessment - atraumatic, no absence of expression. Neck Global Assessment - no abnormal movements, no bruit auscultated on the right, no bruit auscultated on the left, no decreased range of motion, non-tender. Trachea-midline. Thyroid Gland Characteristics - non-tender.  Eye Eyeball - Left-Extraocular movements intact, No Nystagmus. Eyeball - Right-Extraocular movements intact, No Nystagmus. Cornea - Left-No Hazy. Cornea - Right-No Hazy. Sclera/Conjunctiva - Left-No scleral icterus, No Discharge. Sclera/Conjunctiva - Right-No scleral icterus, No Discharge. Pupil - Left-Direct reaction to light normal. Pupil - Right-Direct reaction to light  normal.  ENMT Ears Pinna - Left - no drainage observed, no generalized tenderness observed. Right - no drainage observed, no generalized tenderness observed. Nose and Sinuses External Inspection of the Nose - no destructive lesion observed. Inspection of the nares - Left - quiet respiration. Right - quiet respiration. Mouth and Throat Lips - Upper Lip - no fissures observed, no pallor noted. Lower Lip - no fissures observed, no pallor noted. Nasopharynx - no discharge present. Oral Cavity/Oropharynx - Tongue - no dryness observed. Oral Mucosa - no cyanosis observed. Hypopharynx - no evidence of airway distress observed.  Chest and Lung Exam Inspection Movements - Normal and Symmetrical. Accessory muscles - No use of accessory muscles in breathing. Palpation  Palpation of the chest reveals - Non-tender. Auscultation Breath sounds - Normal and Clear.  Cardiovascular Auscultation Rhythm - Regular. Murmurs & Other Heart Sounds - Auscultation of the heart reveals - No Murmurs and No Systolic Clicks.  Abdomen Inspection Inspection of the abdomen reveals - No Visible peristalsis and No Abnormal pulsations. Umbilicus - No Bleeding, No Urine drainage. Palpation/Percussion Palpation and Percussion of the abdomen reveal - Soft, Non Tender, No Rebound tenderness, No Rigidity (guarding) and No Cutaneous hyperesthesia.  Female Genitourinary Sexual Maturity Tanner 5 - Adult hair pattern. Note: No vaginal bleeding nor discharge  Peripheral Vascular Upper Extremity Inspection - Left - No Cyanotic nailbeds, Not Ischemic. Right - No Cyanotic nailbeds, Not Ischemic.  Neurologic Neurologic evaluation reveals -normal attention span and ability to concentrate, able to name objects and repeat phrases. Appropriate fund of knowledge , normal sensation and normal coordination. Mental Status Affect - not angry, not paranoid. Cranial Nerves-Normal  Bilaterally. Gait-Normal.  Neuropsychiatric Mental status exam performed with findings of-able to articulate well with normal speech/language, rate, volume and coherence, thought content normal with ability to perform basic computations and apply abstract reasoning and no evidence of hallucinations, delusions, obsessions or homicidal/suicidal ideation.  Musculoskeletal Global Assessment Spine, Ribs and Pelvis - no instability, subluxation or laxity. Right Upper Extremity - no instability, subluxation or laxity.  Lymphatic Head & Neck  General Head & Neck Lymphatics: Bilateral - Description - No Localized lymphadenopathy. Axillary  General Axillary Region: Bilateral - Description - No Localized lymphadenopathy. Femoral & Inguinal  Generalized Femoral & Inguinal Lymphatics: Left - Description - No Localized lymphadenopathy. Right - Description - No Localized lymphadenopathy.    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 No mass byt CT but venous aampling strongly suggests left-sided etiology.  Given the unilateral etiology, think she would benefit from surgical resection with a left adrenalectomy. Minimally invasive approach. Discussed with my colleague Dr. Rosendo Gros. We will do it together robotically. She is more than ready to get this done and wishes to proceed as soon as possible.  Current Plans Pt Education - Adrenal Gland Disorders: discussed with patient and provided information. Pt Education - CCS Adrenalectomy You are being scheduled for surgery - Our schedulers will call you.  You should hear from our office's scheduling department within 5 working days about the location, date, and time of surgery. We try to make accommodations for patient's preferences in scheduling surgery, but sometimes the OR schedule or the surgeon's schedule prevents [place] from making those  accommodations.  If you have not heard from our office [phone]) in 5 working days, call the office and ask for your surgeon's nurse.  If you have other questions about your diagnosis, plan, or surgery, call the office and ask for your surgeon's nurse.  Pt Education - CCS Education - Written Instructions given: discussed with patient and provided information. Pt Education - CCS Laparosopic Post Op HCI (Halo Laski)  Adin Hector, M.D., F.A.C.S. Gastrointestinal and Minimally Invasive Surgery [facility] [address], [place], [place] [address] [phone] Main / Paging

## 2016-08-22 NOTE — Addendum Note (Signed)
Addended by: Mora Bellman on: 08/22/2016 12:30 PM   Modules accepted: Orders

## 2016-08-22 NOTE — Telephone Encounter (Signed)
-----   Message from Mora Bellman, MD sent at 08/22/2016 12:30 PM EDT ----- Please inform patient of BV infection. Flagyl has been e-prescribed.  Is it normal not to have results for GC/Cl?  Peggy

## 2016-08-22 NOTE — Telephone Encounter (Signed)
Called pt to adv meds at [place] for pt to rtn call if needed.

## 2016-08-22 NOTE — Telephone Encounter (Signed)
Called pt, no answer, left message to call the office.

## 2016-08-23 LAB — GC/CHLAMYDIA PROBE AMP (~~LOC~~) NOT AT ARMC
Chlamydia: NEGATIVE
Neisseria Gonorrhea: NEGATIVE

## 2016-10-06 ENCOUNTER — Ambulatory Visit (INDEPENDENT_AMBULATORY_CARE_PROVIDER_SITE_OTHER): Payer: Medicaid Other | Admitting: Obstetrics and Gynecology

## 2016-10-06 ENCOUNTER — Other Ambulatory Visit (HOSPITAL_COMMUNITY)
Admission: RE | Admit: 2016-10-06 | Discharge: 2016-10-06 | Disposition: A | Discharge status: Home / Self Care | Payer: Medicaid Other | Source: Ambulatory Visit | Attending: Obstetrics and Gynecology | Admitting: Obstetrics and Gynecology

## 2016-10-06 ENCOUNTER — Other Ambulatory Visit: Payer: Self-pay | Admitting: Obstetrics and Gynecology

## 2016-10-06 ENCOUNTER — Encounter: Payer: Self-pay | Admitting: Obstetrics and Gynecology

## 2016-10-06 VITALS — BP 174/126 | HR 73 | Ht 66.0 in | Wt 213.0 lb

## 2016-10-06 DIAGNOSIS — Z1231 Encounter for screening mammogram for malignant neoplasm of breast: Secondary | ICD-10-CM

## 2016-10-06 DIAGNOSIS — Z124 Encounter for screening for malignant neoplasm of cervix: Secondary | ICD-10-CM | POA: Diagnosis not present

## 2016-10-06 DIAGNOSIS — Z1151 Encounter for screening for human papillomavirus (HPV): Secondary | ICD-10-CM

## 2016-10-06 DIAGNOSIS — Z01419 Encounter for gynecological examination (general) (routine) without abnormal findings: Secondary | ICD-10-CM

## 2016-10-06 DIAGNOSIS — Z01411 Encounter for gynecological examination (general) (routine) with abnormal findings: Secondary | ICD-10-CM | POA: Diagnosis present

## 2016-10-06 LAB — COMPREHENSIVE METABOLIC PANEL
ALK PHOS: 54 U/L (ref 33–115)
ALT: 13 U/L (ref 6–29)
AST: 18 U/L (ref 10–30)
Albumin: 4.1 g/dL (ref 3.6–5.1)
BUN: 13 mg/dL (ref 7–25)
CHLORIDE: 103 mmol/L (ref 98–110)
CO2: 25 mmol/L (ref 20–31)
CREATININE: 1.02 mg/dL (ref 0.50–1.10)
Calcium: 8.8 mg/dL (ref 8.6–10.2)
GLUCOSE: 86 mg/dL (ref 65–99)
POTASSIUM: 4.3 mmol/L (ref 3.5–5.3)
SODIUM: 140 mmol/L (ref 135–146)
TOTAL PROTEIN: 7 g/dL (ref 6.1–8.1)
Total Bilirubin: 0.4 mg/dL (ref 0.2–1.2)

## 2016-10-06 LAB — CBC
HCT: 39.1 % (ref 35.0–45.0)
Hemoglobin: 12.6 g/dL (ref 11.7–15.5)
MCH: 25.6 pg — ABNORMAL LOW (ref 27.0–33.0)
MCHC: 32.2 g/dL (ref 32.0–36.0)
MCV: 79.5 fL — AB (ref 80.0–100.0)
MPV: 9.9 fL (ref 7.5–12.5)
PLATELETS: 278 10*3/uL (ref 140–400)
RBC: 4.92 MIL/uL (ref 3.80–5.10)
RDW: 15.9 % — AB (ref 11.0–15.0)
WBC: 5.9 10*3/uL (ref 3.8–10.8)

## 2016-10-06 LAB — LIPID PANEL
CHOL/HDL RATIO: 3.5 ratio (ref <=5.0)
Cholesterol: 175 mg/dL (ref 125–200)
HDL: 50 mg/dL (ref >=46)
LDL CALC: 112 mg/dL (ref <130)
Triglycerides: 65 mg/dL (ref <150)
VLDL: 13 mg/dL (ref <30)

## 2016-10-06 LAB — TSH: TSH: 1.47 mIU/L

## 2016-10-06 NOTE — Progress Notes (Addendum)
Subjective:     Amanda Fields is a 40 y.o. female G2P2 who is here for a comprehensive physical exam. The patient reports no problems. She is sexually active without complaints. She reports a persists vaginal odor without discharge. She would like to ensure that she still doesn't have BV  Past Medical History:  Diagnosis Date  . Asthma   . 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 Cough due to ACE inhibitor   . Dizziness   . Edema leg   . Endometriosis   . Hypertension    a. echo 03/2012 EF 60-65%, moderate LVH, nl LV diastolic fxn, nl PASP; b. echo 12/2015: EF 55-60%  . Migraine headache   . Obesity   . Palpitation    Past Surgical History:  Procedure Laterality Date  . IR GENERIC HISTORICAL  07/07/2016   IR VENOGRAM RENAL UNI LEFT 07/07/2016 Aletta Edouard, MD [facility]  . IR GENERIC HISTORICAL  07/07/2016   IR VENOUS SAMPLING 07/07/2016 Aletta Edouard, MD [facility]  . IR GENERIC HISTORICAL  07/07/2016   IR VENOGRAM ADRENAL BI 07/07/2016 Aletta Edouard, MD [facility]  . IR GENERIC HISTORICAL  07/07/2016   IR US GUIDE VASC ACCESS RIGHT 07/07/2016 Aletta Edouard, MD [facility]  . IR GENERIC HISTORICAL  07/07/2016   IR ANGIOGRAM SELECTIVE EACH ADDITIONAL VESSEL 07/07/2016 Aletta Edouard, MD [facility]  . IR GENERIC HISTORICAL  07/07/2016   IR VENOUS SAMPLING 07/07/2016 Aletta Edouard, MD [facility]  . IR GENERIC HISTORICAL  07/07/2016   IR US GUIDE VASC ACCESS RIGHT 07/07/2016 Aletta Edouard, MD [facility]  . LAPAROSCOPIC SUPRACERVICAL HYSTERECTOMY  09/27/2010   For endometriosis. Ovaries anf tubes are in place.  . TONSILLECTOMY    . TUBAL LIGATION  2006  . WISDOM TOOTH EXTRACTION     Family History  Problem Relation Age of Onset  . COPD Mother   . Hypotension Mother   . Colon cancer Father   . Emphysema Father   . Cancer Father     colon  . Hyperlipidemia Sister   . Heart failure Paternal Uncle   . Heart attack Cousin 32  . Heart disease Cousin     heart attack  .  Adrenal disorder Neg Hx     Social History   Social History  . Marital status: Single    Spouse name: N/A  . Number of children: 2  . Years of education: N/A   Occupational History  . call center    Social History Main Topics  . Smoking status: Never Smoker  . Smokeless tobacco: Never Used  . Alcohol use No     Comment: social  . Drug use: No  . Sexual activity: Yes    Partners: Male    Birth control/ protection: Surgical   Other Topics Concern  . Not on file   Social History Narrative  . No narrative on file   Health Maintenance  Topic Date Due  . INFLUENZA VACCINE  07/18/2016  . PAP SMEAR  10/04/2018  . TETANUS/TDAP  01/03/2022  . HIV Screening  Completed       Review of Systems Pertinent items are noted in HPI.   Objective:  Blood pressure (!) 174/126, pulse 73, height 5' 6"  (1.676 m), weight 213 lb (96.6 kg).     GENERAL: Well-developed, well-nourished female in no acute distress.  HEENT: Normocephalic, atraumatic. Sclerae anicteric.  NECK: Supple. Normal thyroid.  LUNGS: Clear to auscultation bilaterally.  HEART: Regular rate and rhythm. BREASTS:  Symmetric in size. No palpable masses or lymphadenopathy, skin changes, or nipple drainage. ABDOMEN: Soft, nontender, nondistended. No organomegaly. PELVIC: Normal external female genitalia. Vagina is pink and rugated.  Normal discharge. Normal appearing cervix. No adnexal mass or tenderness. EXTREMITIES: No cyanosis, clubbing, or edema, 2+ distal pulses.    Assessment:    Healthy female exam.      Plan:    pap smear collected Referral for screening mammogram Fasting labs Patient scheduled for adrenal gland surgery for management of her HTN See After Visit Summary for Counseling Recommendations

## 2016-10-07 ENCOUNTER — Other Ambulatory Visit: Payer: Self-pay | Admitting: Obstetrics and Gynecology

## 2016-10-07 LAB — WET PREP BY MOLECULAR PROBE
Candida species: NEGATIVE
Gardnerella vaginalis: NEGATIVE
TRICHOMONAS VAG: NEGATIVE

## 2016-10-07 LAB — VITAMIN D 25 HYDROXY (VIT D DEFICIENCY, FRACTURES): VIT D 25 HYDROXY: 16 ng/mL — AB (ref 30–100)

## 2016-10-07 MED ORDER — CALCIUM CITRATE-VITAMIN D 250-100 MG-UNIT PO TABS: 1.0000 | ORAL_TABLET | Freq: Two times a day (BID) | ORAL | 3 refills | Status: DC

## 2016-10-10 LAB — CYTOLOGY - PAP: ADEQUACY: ABSENT — AB

## 2016-10-11 ENCOUNTER — Telehealth: Payer: Self-pay | Admitting: *Deleted

## 2016-10-11 NOTE — Telephone Encounter (Signed)
Informed pt of result, scheduled colpo for 11-15-16 at 3:00pm.

## 2016-10-11 NOTE — Telephone Encounter (Signed)
-----   Message from Mora Bellman, MD sent at 10/10/2016  5:11 PM EDT ----- Please inform patient of abnormal pap smear and need for colposcopy  Thanks  [facility]

## 2016-10-12 ENCOUNTER — Other Ambulatory Visit: Payer: Self-pay | Admitting: Cardiology

## 2016-10-12 ENCOUNTER — Telehealth: Payer: Self-pay | Admitting: *Deleted

## 2016-10-12 NOTE — Telephone Encounter (Signed)
Informed pt of low Vitamin D level and that rx had been sent to pharmacy.

## 2016-10-12 NOTE — Telephone Encounter (Signed)
-----   Message from Mora Bellman, MD sent at 10/07/2016  1:04 PM EDT ----- Please inform patient of vitamin D deficiency. Rx has been e-prescribed  Holiday representative

## 2016-10-13 ENCOUNTER — Telehealth: Payer: Self-pay | Admitting: Cardiology

## 2016-10-13 ENCOUNTER — Telehealth: Payer: Self-pay | Admitting: *Deleted

## 2016-10-13 DIAGNOSIS — R7989 Other specified abnormal findings of blood chemistry: Secondary | ICD-10-CM

## 2016-10-13 DIAGNOSIS — Z780 Asymptomatic menopausal state: Secondary | ICD-10-CM

## 2016-10-13 MED ORDER — ESTRADIOL 0.025 MG/24HR TD PTTW: 1.0000 | MEDICATED_PATCH | TRANSDERMAL | 12 refills | Status: DC

## 2016-10-13 MED ORDER — CALCIUM CITRATE-VITAMIN D 250-100 MG-UNIT PO TABS: 1.0000 | ORAL_TABLET | Freq: Two times a day (BID) | ORAL | 3 refills | Status: DC

## 2016-10-13 MED ORDER — ESCITALOPRAM OXALATE 20 MG PO TABS: 20.0000 mg | ORAL_TABLET | Freq: Every day | ORAL | 4 refills | Status: DC

## 2016-10-13 NOTE — Telephone Encounter (Signed)
Pt called in regards to Vitamin D rx, informed her that rx had been sent to pharmacy on 10-07-16, also requesting Lexapro and Estradiol to be called into pharmacy as well, spoke with Dr Elly Modena about medications at last visit.  Called medications to pharmacy per Dr Elly Modena order.

## 2016-10-13 NOTE — Telephone Encounter (Signed)
Lmov for patient to call back and schedule an appointment so patient can get refills.

## 2016-10-13 NOTE — Telephone Encounter (Signed)
-----   Message from Amanda Plater, LPN sent at 00/37/9444  9:58 AM EDT ----- Patient needs an appt with Dr. Yvone Neu before refill. Please contact patient and let me know when appt is scheduled. Thanks!

## 2016-10-17 ENCOUNTER — Ambulatory Visit
Admission: RE | Admit: 2016-10-17 | Discharge: 2016-10-17 | Disposition: A | Discharge status: Home / Self Care | Payer: Medicaid Other | Source: Ambulatory Visit | Attending: Obstetrics and Gynecology | Admitting: Obstetrics and Gynecology

## 2016-10-17 DIAGNOSIS — Z1231 Encounter for screening mammogram for malignant neoplasm of breast: Secondary | ICD-10-CM

## 2016-10-24 ENCOUNTER — Encounter (HOSPITAL_COMMUNITY)
Admission: RE | Admit: 2016-10-24 | Discharge: 2016-10-24 | Disposition: A | Discharge status: Home / Self Care | Payer: Medicaid Other | Source: Ambulatory Visit | Attending: Surgery | Admitting: Surgery

## 2016-10-24 ENCOUNTER — Encounter (HOSPITAL_COMMUNITY): Payer: Self-pay

## 2016-10-24 HISTORY — DX: Anemia, unspecified: D64.9

## 2016-10-24 HISTORY — DX: Other specified disorders of adrenal gland: E27.8

## 2016-10-24 NOTE — Patient Instructions (Addendum)
Amanda Fields  10/24/2016   Your procedure is scheduled on: 10-27-16  Report to [facility] Main  Entrance take [facility]  elevators to 3rd floor to  [place] at   [phone].  Call this number if you have problems the morning of surgery [phone]   Remember: ONLY 1 PERSON MAY GO WITH YOU TO SHORT STAY TO GET  READY MORNING OF [place].  Do not eat food or drink liquids :After Midnight.     Take these medicines the morning of surgery with A SIP OF WATER: Metoprolol. Xyzal. DO NOT TAKE ANY DIABETIC MEDICATIONS DAY OF YOUR SURGERY                               You may not have any metal on your body including hair pins and              piercings  Do not wear jewelry, make-up, lotions, powders or perfumes, deodorant             Do not wear nail polish.  Do not shave  48 hours prior to surgery.              Men may shave face and neck.   Do not bring valuables to the hospital. [place].  Contacts, dentures or bridgework may not be worn into surgery.  Leave suitcase in the car. After surgery it may be brought to your room.     Patients discharged the day of surgery will not be allowed to drive home.  Name and phone number of your driver: Bland Span, friend- [phone] cell  Special Instructions: N/A              Please read over the following fact sheets you were given: _____________________________________________________________________             [facility] - Preparing for Surgery Before surgery, you can play an important role.  Because skin is not sterile, your skin needs to be as free of germs as possible.  You can reduce the number of germs on your skin by washing with CHG (chlorahexidine gluconate) soap before surgery.  CHG is an antiseptic cleaner which kills germs and bonds with the skin to continue killing germs even after washing. Please DO NOT use if you have an allergy to CHG or  antibacterial soaps.  If your skin becomes reddened/irritated stop using the CHG and inform your nurse when you arrive at Short Stay. Do not shave (including legs and underarms) for at least 48 hours prior to the first CHG shower.  You may shave your face/neck. Please follow these instructions carefully:  1.  Shower with CHG Soap the night before surgery and the  morning of Surgery.  2.  If you choose to wash your hair, wash your hair first as usual with your  normal  shampoo.  3.  After you shampoo, rinse your hair and body thoroughly to remove the  shampoo.                           4.  Use CHG as you would any other liquid soap.  You can apply chg directly  to  the skin and wash                       Gently with a scrungie or clean washcloth.  5.  Apply the CHG Soap to your body ONLY FROM THE NECK DOWN.   Do not use on face/ open                           Wound or open sores. Avoid contact with eyes, ears mouth and genitals (private parts).                       Wash face,  Genitals (private parts) with your normal soap.             6.  Wash thoroughly, paying special attention to the area where your surgery  will be performed.  7.  Thoroughly rinse your body with warm water from the neck down.  8.  DO NOT shower/wash with your normal soap after using and rinsing off  the CHG Soap.                9.  Pat yourself dry with a clean towel.            10.  Wear clean pajamas.            11.  Place clean sheets on your bed the night of your first shower and do not  sleep with pets. Day of Surgery : Do not apply any lotions/deodorants the morning of surgery.  Please wear clean clothes to the hospital/surgery center.  FAILURE TO FOLLOW THESE INSTRUCTIONS MAY RESULT IN THE CANCELLATION OF YOUR SURGERY PATIENT SIGNATURE_________________________________  NURSE SIGNATURE__________________________________  ________________________________________________________________________

## 2016-10-24 NOTE — Pre-Procedure Instructions (Signed)
10-06-16 labs in Epic - CBC,CMP,LP, TSH. Vit. D. EKG, Echo, Stress CXR, 1'17 Epic. CTscan 04-20-16 Epic.

## 2016-10-25 NOTE — Telephone Encounter (Signed)
Pt is coming 10/26/16 to see Dr Yvone Neu

## 2016-10-26 ENCOUNTER — Ambulatory Visit (INDEPENDENT_AMBULATORY_CARE_PROVIDER_SITE_OTHER): Payer: Medicaid Other | Admitting: Cardiology

## 2016-10-26 ENCOUNTER — Encounter: Payer: Self-pay | Admitting: Cardiology

## 2016-10-26 VITALS — BP 148/100 | HR 76 | Ht 66.0 in | Wt 210.8 lb

## 2016-10-26 DIAGNOSIS — I152 Hypertension secondary to endocrine disorders: Secondary | ICD-10-CM

## 2016-10-26 MED ORDER — METOPROLOL SUCCINATE ER 50 MG PO TB24: 50.0000 mg | ORAL_TABLET | Freq: Every day | ORAL | 3 refills | Status: DC

## 2016-10-26 NOTE — Progress Notes (Addendum)
Cardiology Office Note   Date:  10/26/2016   ID:  Amanda Fields, DOB 05-Dec-1976, MRN [id number]  Referring Doctor:  Keith Rake, MD   Cardiologist:   Wende Bushy, MD   Reason for consultation:  Chief Complaint  Patient presents with  . other    Pt needs refills pt currently holding medications due to upcoming procedure. Meds reviewed verbally with pt.      History of Present Illness: Amanda Fields is a 40 y.o. female who presents for Follow-up for hypertension.  Since last visit, she has been seeing nephrology as well as endocrinology for management of secondary hypertension. Tomorrow, she will be undergoing surgery for left adrenalectomy.  She denies chest pain, shortness of breath, abdominal pain. No edema. Her blood pressure was still difficult to control despite changes to her medications.   ROS:  Please see the history of present illness. Aside from mentioned under HPI, all other systems are reviewed and negative.     Past Medical History:  Diagnosis Date  . Adrenal mass, left ([place])   . Anemia    none since Hysterectomy done-related to heavy menses  . Asthma    Related to allergies"wheezes with pollen exposure" no asthma attacks  . Breast lipoma 09/06/2011   resolved- no surgery-tx. medically.  . Cough due to ACE inhibitor   . Dizziness   . Edema leg   . Endometriosis   . Hypertension    a. echo 03/2012 EF 60-65%, moderate LVH, nl LV diastolic fxn, nl PASP; b. echo 12/2015: EF 55-60%  . Migraine headache    migrianes-less frequent now  . Obesity   . Palpitation     Past Surgical History:  Procedure Laterality Date  . IR GENERIC HISTORICAL  07/07/2016   IR VENOGRAM RENAL UNI LEFT 07/07/2016 Aletta Edouard, MD [facility]  . IR GENERIC HISTORICAL  07/07/2016   IR VENOUS SAMPLING 07/07/2016 Aletta Edouard, MD [facility]  . IR GENERIC HISTORICAL  07/07/2016   IR VENOGRAM ADRENAL BI 07/07/2016 Aletta Edouard, MD [facility]  . IR GENERIC HISTORICAL   07/07/2016   IR US GUIDE VASC ACCESS RIGHT 07/07/2016 Aletta Edouard, MD [facility]  . IR GENERIC HISTORICAL  07/07/2016   IR ANGIOGRAM SELECTIVE EACH ADDITIONAL VESSEL 07/07/2016 Aletta Edouard, MD [facility]  . IR GENERIC HISTORICAL  07/07/2016   IR VENOUS SAMPLING 07/07/2016 Aletta Edouard, MD [facility]  . IR GENERIC HISTORICAL  07/07/2016   IR US GUIDE VASC ACCESS RIGHT 07/07/2016 Aletta Edouard, MD [facility]  . LAPAROSCOPIC SUPRACERVICAL HYSTERECTOMY  09/27/2010   For endometriosis. Ovaries anf tubes are in place.  Marland Kitchen NASAL SEPTUM SURGERY    . TONSILLECTOMY    . TUBAL LIGATION  2006  . WISDOM TOOTH EXTRACTION       reports that she has never smoked. She has never used smokeless tobacco. She reports that she does not drink alcohol or use drugs.   family history includes COPD in her mother; Cancer in her father; Colon cancer in her father; Emphysema in her father; Heart attack (age of onset: 77) in her cousin; Heart disease in her cousin; Heart failure in her paternal uncle; Hyperlipidemia in her sister; Hypotension in her mother.   Current Outpatient Prescriptions  Medication Sig Dispense Refill  . metoprolol succinate (TOPROL-XL) 50 MG 24 hr tablet TAKE 1 TABLET (50 MG TOTAL) BY MOUTH DAILY. 90 tablet 3  . aspirin EC 81 MG EC tablet Take 1 tablet (81 mg  total) by mouth daily. (Patient not taking: Reported on 10/26/2016) 30 tablet 0  . calcium-vitamin D 250-100 MG-UNIT tablet Take 1 tablet by mouth 2 (two) times daily. (Patient not taking: Reported on 10/26/2016) 60 tablet 3  . escitalopram (LEXAPRO) 20 MG tablet Take 1 tablet (20 mg total) by mouth daily. (Patient not taking: Reported on 10/26/2016) 31 tablet 4  . estradiol (VIVELLE-DOT) 0.025 MG/24HR Place 1 patch onto the skin 2 (two) times a week. (Patient not taking: Reported on 10/26/2016) 8 patch 12  . furosemide (LASIX) 20 MG tablet Take 20 mg by mouth daily.    Marland Kitchen levocetirizine (XYZAL) 5 MG tablet Take 5 mg by mouth daily as  needed for allergies.     . multivitamin-iron-minerals-folic acid (CENTRUM) chewable tablet Chew 1 tablet by mouth daily.      Marland Kitchen olmesartan-hydrochlorothiazide (BENICAR HCT) 40-12.5 MG tablet Take 1 tablet by mouth at bedtime.    Marland Kitchen spironolactone (ALDACTONE) 50 MG tablet Take 1 tablet (50 mg total) by mouth 2 (two) times daily. (Patient not taking: Reported on 10/26/2016) 60 tablet 11   No current facility-administered medications for this visit.    Facility-Administered Medications Ordered in Other Visits  Medication Dose Route Frequency Provider Last Rate Last Dose  . cosyntropin (CORTROSYN) injection 0.05 mg  0.05 mg Intravenous Once Monia Sabal, PA-C        Allergies: Ace inhibitors and Latex    PHYSICAL EXAM: VS:  BP (!) 148/100 (BP Location: Left Arm, Patient Position: Sitting, Cuff Size: Large)   Pulse 76   Ht 5' 6"  (1.676 m)   Wt 210 lb 12 oz (95.6 kg)   BMI 34.02 kg/m  , Body mass index is 34.02 kg/m. Wt Readings from Last 3 Encounters:  10/26/16 210 lb 12 oz (95.6 kg)  10/24/16 210 lb (95.3 kg)  10/06/16 213 lb (96.6 kg)    GENERAL:  well developed, well nourished,  obese, not in acute distress HEENT: normocephalic, pink conjunctivae, anicteric sclerae, no xanthelasma, normal dentition, oropharynx clear NECK:  no neck vein engorgement, JVP normal, no hepatojugular reflux, carotid upstroke brisk and symmetric, no bruit, no thyromegaly, no lymphadenopathy LUNGS:  good respiratory effort, clear to auscultation bilaterally CV:  PMI not displaced, no thrills, no lifts, S1 and S2 within normal limits, no palpable S3 or S4, no murmurs, no rubs, no gallops ABD:  Soft, nontender, nondistended, normoactive bowel sounds, no abdominal aortic bruit, no hepatomegaly, no splenomegaly MS: nontender back, no kyphosis, no scoliosis, no joint deformities EXT:  2+ DP/PT pulses, no edema, no varicosities, no cyanosis, no clubbing SKIN: warm, nondiaphoretic, normal turgor, no  ulcers NEUROPSYCH: alert, oriented to person, place, and time, sensory/motor grossly intact, normal mood, appropriate affect  Recent Labs: 10/06/2016: ALT 13; BUN 13; Creat 1.02; Hemoglobin 12.6; Platelets 278; Potassium 4.3; Sodium 140; TSH 1.47   Lipid Panel    Component Value Date/Time   CHOL 175 10/06/2016 0843   TRIG 65 10/06/2016 0843   HDL 50 10/06/2016 0843   CHOLHDL 3.5 10/06/2016 0843   VLDL 13 10/06/2016 0843   LDLCALC 112 10/06/2016 0843     Other studies Reviewed:  EKG:  EKG From 10/26/2016 was personally reviewed by me. This showed sinus rhythm, 76 BPM. T wave inversions in the inferior and anterolateral leads. These were similar to EKG from 01/02/2016.  Additional studies/ records that were reviewed personally reviewed by me today include:   Echocardiogram 01/01/2016: EF 55-60%.  Exercise nuclear stress test 01/17/2016:  Blood  pressure demonstrated a hypertensive response to exercise. The test was switched to [place].  T wave inversion was noted during stress in the II, III, aVF, V3, V4, V5 and V6 leads, beginning at 0 minutes of stress, ending at 3 minutes of stress. T wave inversion persisted.  Defect 1: There is a medium defect of moderate severity present in the apex location. this likely due to breast attenuation.  The study is normal. However, this is a poor quality study due to breast attenuation and intense GI uptake.  This is a low risk study.  The left ventricular ejection fraction is normal (55-65%)  Renal artery ultrasound to 02/08/2016: Normal renal arteries  PRA LC/MS/MS 0.167 - 5.380 ng/mL/hr 0.277   Comments: (NOTE)  This test was developed and its performance characteristics  determined by [facility]. It has not been cleared or approved  by the Food and Drug Administration.        **Please note reference interval change**     ALDO / PRA Ratio 0.0 - 30.0  50.9 (H)   Comments: (NOTE)              Units:   ng/dL  per ng/mL/hr  Performed At: [facility]  [place], [place] [id number]  Lindon Romp MD [id number]     Aldosterone 0.0 - 30.0 ng/dL 14.1   Comments: (NOTE)  This test was developed and its performance characteristics  determined by [facility]. 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 We will send in prescription for metoprolol succinate 50 mg daily. Since she will be coming to our office on as needed basis, she can follow-up with her PCP for this. Post surgery, she will likely need medication adjustment.  Obesity Body mass index is 34.02 kg/m.Marland Kitchen Recommend aggressive weight loss through diet and increased physical activity.      Current medicines are reviewed at length with the patient today.  The patient does not have concerns regarding medicines.  Labs/ tests ordered today include:  Orders Placed This Encounter  Procedures  . EKG 12-Lead    I had a lengthy and detailed discussion with the patient regarding diagnoses, prognosis, diagnostic options, treatment options, and side effects of medications.   I counseled the patient on importance of lifestyle modification including heart healthy diet, regular physical activity.  Disposition:   FU with undersigned prn     Signed, Wende Bushy, MD  10/26/2016 9:42 AM    [place]

## 2016-10-26 NOTE — H&P (Signed)
Amanda Fields 08/22/2016 9:33 AM Location: [facility] Patient #: [id number] DOB: [birth year] Single / Language: Cleophus Molt / Race: Black or African American Female  Patient Care Team: Roselee Nova, MD as PCP - General (Family Medicine) Michael Boston, MD as Consulting Physician (General Surgery) Renato Shin, MD as Consulting Physician (Endocrinology)   History of Present Illness Adin Hector MD; 08/22/2016 10:50 AM) The patient is a 40 year old female who presents wtih an adrenal mass. Note for "Adrenal mass": Patient sent for surgical consultation by Dr Landry Mellow for concern of hyperaldosteronism. Venous sampling suggested left-sided etiology despite no mass on CT  Pleasant active female. Diagnosed with hypertension after delivering her last child in 2006 age 30. Was due to get elective deviated septum surgery, but she was found to have much worse hypertension. Had to take much more blood pressure medications. Actually evaluated in the emergency room with elevated troponin and cardiac strain. Workup done showing elevated aldosterone level. No adrenal mass seen on CT scan. Venous sampling strongly favors left adrenal side.   Aldosterone levels under better control. Blood pressure under better control on spironolactone, potassium supplementation, & BP meds. However, given the unilateral etiology, her endocrinologist recommended unilateral adrenalectomy to help deal with the hyperaldosteronism. Patient wishes to have surgery as a chance to cure the problem and get off the many blood pressure medicines. No from family history of blood pressure issues. She normally walk couple miles without difficulty. She had abdominal hysterectomy in 2011 without incident. Moves her bowels about every day. She does not smoke.     ADDENDUM REPORT: 07/24/2016 10:34  ADDENDUM: Results from the adrenal venous sampling of are tabulated below:  Serum (0720 hours): Aldosterone  8.7 ng/dL, Cortisol 15.1 mcg/dL  Pre-Stimulation:  Infrarenal IVC: Aldosterone 8.1, Cortisol 7.4  Suprarenal IVC: Aldosterone 7.9, Cortisol 7.0  Left Renal Vein: Aldosterone 6.1, Cortisol 5.8  Right Adrenal Vein: Aldosterone < 1, Cortisol 5.7  Left Adrenal Vein: Aldosterone 503.4, Cortisol 17.2  Post-Stimulation:  Infrarenal IVC: Aldosterone 33.6, Cortisol 25.5  Suprarenal IVC: Aldosterone 131.9, Cortisol 55.3  Left Renal Vein: Aldosterone 248.6, Cortisol 99.4  Right Adrenal Vein: Aldosterone < 1, Cortisol 21.1  Left Adrenal Vein: Aldosterone 3,734.7, Cortisol >100  Both pre and post stimulation sampling clearly show lateralization of hyperaldosteronism and higher cortisol production to the left adrenal gland compared to the right.   Electronically Signed By: Aletta Edouard M.D. On: 07/24/2016 10:34   Other Problems Sander Nephew, CMA; 08/22/2016 9:53 AM) Asthma Hemorrhoids High blood pressure Migraine Headache Sleep Apnea  Past Surgical History Sander Nephew, [place]; 08/22/2016 9:53 AM) Hysterectomy (not due to cancer) - Partial Tonsillectomy  Diagnostic Studies History Sander Nephew, CMA; 08/22/2016 9:53 AM) Colonoscopy never Mammogram within last year Pap Smear 1-5 years ago  Allergies Sander Nephew, CMA; 08/22/2016 9:57 AM) Latex Iodinated Contrast Media ACE Inhibitors  Medication History Sander Nephew, CMA; 08/22/2016 9:58 AM) Ampicillin (500MG Capsule, Oral) Active. Escitalopram Oxalate (20MG Tablet, Oral) Active. Estradiol (0.025MG/24HR Patch TW, Transdermal) Active. Furosemide (20MG Tablet, Oral) Active. Metoprolol Succinate ER (50MG Tablet ER 24HR, Oral) Active. MetroNIDAZOLE (500MG Tablet, Oral) Active. Olmesartan Medoxomil (20MG Tablet, Oral) Active. Olmesartan Medoxomil (40MG Tablet, Oral) Active. Spironolactone (25MG Tablet, Oral) Active. Spironolactone (50MG Tablet, Oral) Active. Medications  Reconciled  Social History Sander Nephew, [place]; 08/22/2016 9:53 AM) Alcohol use Occasional alcohol use. Caffeine use Carbonated beverages, Tea. No drug use Tobacco use Never smoker.  Family History Sander Nephew, [place]; 08/22/2016 9:53 AM) Alcohol Abuse Father.  Cancer Sister. Colon Cancer Father. Hypertension Mother. Kidney Disease Daughter. Respiratory Condition Mother.  Pregnancy / Birth History Sander Nephew, [place]; 08/22/2016 9:53 AM) Age at menarche 46 years. Age of menopause <43 Contraceptive History Depo-provera, Oral contraceptives. Gravida 2 Irregular periods Length (months) of breastfeeding 3-6 Maternal age 55-25 Para 2    Review of Systems Sander Nephew CMA; 08/22/2016 9:54 AM) General Present- Fatigue, Night Sweats and Weight Gain. Not Present- Appetite Loss, Chills, Fever and Weight Loss. HEENT Present- Seasonal Allergies, Sinus Pain and Wears glasses/contact lenses. Not Present- Earache, Hearing Loss, Hoarseness, Nose Bleed, Oral Ulcers, Ringing in the Ears, Sore Throat, Visual Disturbances and Yellow Eyes. Respiratory Present- Snoring. Not Present- Bloody sputum, Chronic Cough, Difficulty Breathing and Wheezing. Breast Not Present- Breast Mass, Breast Pain, Nipple Discharge and Skin Changes. Cardiovascular Present- Leg Cramps and Swelling of Extremities. Not Present- Chest Pain, Difficulty Breathing Lying Down, Palpitations, Rapid Heart Rate and Shortness of Breath. Gastrointestinal Present- Hemorrhoids. Not Present- Abdominal Pain, Bloating, Bloody Stool, Change in Bowel Habits, Chronic diarrhea, Constipation, Difficulty Swallowing, Excessive gas, Gets full quickly at meals, Indigestion, Nausea, Rectal Pain and Vomiting. Female Genitourinary Present- Pelvic Pain. Not Present- Frequency, Nocturia, Painful Urination and Urgency. Musculoskeletal Present- Back Pain, Joint Pain, Joint Stiffness, Muscle Pain, Muscle Weakness and Swelling of  Extremities. Neurological Present- Decreased Memory and Headaches. Not Present- Fainting, Numbness, Seizures, Tingling, Tremor, Trouble walking and Weakness. Psychiatric Present- Change in Sleep Pattern. Not Present- Anxiety, Bipolar, Depression, Fearful and Frequent crying. Endocrine Present- Hot flashes. Not Present- Cold Intolerance, Excessive Hunger, Hair Changes, Heat Intolerance and New Diabetes. Hematology Not Present- Blood Thinners, Easy Bruising, Excessive bleeding, Gland problems, HIV and Persistent Infections.  Vitals Georgia Lopes Albanese CMA; 08/22/2016 9:55 AM) 08/22/2016 9:54 AM Weight: 215.6 lb Height: 66in Body Surface Area: 2.07 m Body Mass Index: 34.8 kg/m  Temp.: 98.48F(Oral)  Pulse: 76 (Regular)  Resp.: 18 (Unlabored)  P.OX: 96% (Room air) BP: 138/78 (Sitting, Left Arm, Standard)       Physical Exam Adin Hector MD; 08/22/2016 10:30 AM) General Mental Status-Alert. General Appearance-Not in acute distress, Not Sickly. Orientation-Oriented X3. Hydration-Well hydrated. Voice-Normal.  Integumentary Global Assessment Upon inspection and palpation of skin surfaces of the - Axillae: non-tender, no inflammation or ulceration, no drainage. and Distribution of scalp and body hair is normal. General Characteristics Temperature - normal warmth is noted.  Head and Neck Head-normocephalic, atraumatic with no lesions or palpable masses. Face Global Assessment - atraumatic, no absence of expression. Neck Global Assessment - no abnormal movements, no bruit auscultated on the right, no bruit auscultated on the left, no decreased range of motion, non-tender. Trachea-midline. Thyroid Gland Characteristics - non-tender.  Eye Eyeball - Left-Extraocular movements intact, No Nystagmus. Eyeball - Right-Extraocular movements intact, No Nystagmus. Cornea - Left-No Hazy. Cornea - Right-No Hazy. Sclera/Conjunctiva - Left-No scleral icterus,  No Discharge. Sclera/Conjunctiva - Right-No scleral icterus, No Discharge. Pupil - Left-Direct reaction to light normal. Pupil - Right-Direct reaction to light normal.  ENMT Ears Pinna - Left - no drainage observed, no generalized tenderness observed. Right - no drainage observed, no generalized tenderness observed. Nose and Sinuses External Inspection of the Nose - no destructive lesion observed. Inspection of the nares - Left - quiet respiration. Right - quiet respiration. Mouth and Throat Lips - Upper Lip - no fissures observed, no pallor noted. Lower Lip - no fissures observed, no pallor noted. Nasopharynx - no discharge present. Oral Cavity/Oropharynx - Tongue - no dryness observed. Oral Mucosa - no cyanosis observed.  Hypopharynx - no evidence of airway distress observed.  Chest and Lung Exam Inspection Movements - Normal and Symmetrical. Accessory muscles - No use of accessory muscles in breathing. Palpation Palpation of the chest reveals - Non-tender. Auscultation Breath sounds - Normal and Clear.  Cardiovascular Auscultation Rhythm - Regular. Murmurs & Other Heart Sounds - Auscultation of the heart reveals - No Murmurs and No Systolic Clicks.  Abdomen Inspection Inspection of the abdomen reveals - No Visible peristalsis and No Abnormal pulsations. Umbilicus - No Bleeding, No Urine drainage. Palpation/Percussion Palpation and Percussion of the abdomen reveal - Soft, Non Tender, No Rebound tenderness, No Rigidity (guarding) and No Cutaneous hyperesthesia.  Female Genitourinary Sexual Maturity Tanner 5 - Adult hair pattern. Note: No vaginal bleeding nor discharge   Peripheral Vascular Upper Extremity Inspection - Left - No Cyanotic nailbeds, Not Ischemic. Right - No Cyanotic nailbeds, Not Ischemic.  Neurologic Neurologic evaluation reveals -normal attention span and ability to concentrate, able to name objects and repeat phrases. Appropriate fund of knowledge ,  normal sensation and normal coordination. Mental Status Affect - not angry, not paranoid. Cranial Nerves-Normal Bilaterally. Gait-Normal.  Neuropsychiatric Mental status exam performed with findings of-able to articulate well with normal speech/language, rate, volume and coherence, thought content normal with ability to perform basic computations and apply abstract reasoning and no evidence of hallucinations, delusions, obsessions or homicidal/suicidal ideation.  Musculoskeletal Global Assessment Spine, Ribs and Pelvis - no instability, subluxation or laxity. Right Upper Extremity - no instability, subluxation or laxity.  Lymphatic Head & Neck  General Head & Neck Lymphatics: Bilateral - Description - No Localized lymphadenopathy. Axillary  General Axillary Region: Bilateral - Description - No Localized lymphadenopathy. Femoral & Inguinal  Generalized Femoral & Inguinal Lymphatics: Left - Description - No Localized lymphadenopathy. Right - Description - No Localized lymphadenopathy.    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 No mass by CT but venous sampling strongly suggests left-sided etiology.  Given the unilateral etiology, think she would benefit from surgical resection with a left adrenalectomy. Minimally invasive approach. Discussed with my colleague Dr. Rosendo Gros. We will do it together robotically. She is more than ready to get this done and wishes to proceed as soon as possible. Current Plans Pt Education - Adrenal Gland Disorders: discussed with patient and provided information. Pt Education - [facility] Adrenalectomy You are being scheduled for surgery - Our schedulers will call you.  You should hear from our office's scheduling department within 5 working days about the location, date, and time of surgery. We try to make accommodations for patient's preferences in scheduling surgery, but sometimes the OR schedule or  the surgeon's schedule prevents [place] from making those accommodations.  If you have not heard from our office [phone]) in 5 working days, call the office and ask for your surgeon's nurse.  If you have other questions about your diagnosis, plan, or surgery, call the office and ask for your surgeon's nurse.  Pt Education - [facility] Education - Written Instructions given: discussed with patient and provided information. Pt Education - [facility] Laparosopic Post Op HCI (Embry Manrique)  Adin Hector, M.D., F.A.C.S. Gastrointestinal and Minimally Invasive Surgery [facility] [address] [place], [place] [address] [phone] Main / Paging

## 2016-10-26 NOTE — Patient Instructions (Addendum)
Medication Instructions:  Refill sent in for Metoprolol.    Follow-Up:  Make sure to follow up with endocrine and your primary care physician.   Your physician recommends that you schedule a follow-up appointment as needed with Dr. Yvone Neu.  It was a pleasure seeing you today here in the office. Please do not hesitate to give [place] a call back if you have any further questions. [place], BSN

## 2016-10-27 ENCOUNTER — Inpatient Hospital Stay (HOSPITAL_COMMUNITY): Payer: Medicaid Other | Admitting: Anesthesiology

## 2016-10-27 ENCOUNTER — Observation Stay (HOSPITAL_COMMUNITY)
Admission: RE | Admit: 2016-10-27 | Discharge: 2016-10-28 | Disposition: A | Discharge status: Home / Self Care | Payer: Medicaid Other | Source: Ambulatory Visit | Attending: Surgery | Admitting: Surgery

## 2016-10-27 ENCOUNTER — Encounter (HOSPITAL_COMMUNITY)
Admission: RE | Disposition: A | Discharge status: Home / Self Care | Payer: Self-pay | Source: Ambulatory Visit | Attending: Surgery

## 2016-10-27 ENCOUNTER — Encounter (HOSPITAL_COMMUNITY): Payer: Self-pay | Admitting: *Deleted

## 2016-10-27 DIAGNOSIS — E669 Obesity, unspecified: Secondary | ICD-10-CM | POA: Diagnosis not present

## 2016-10-27 DIAGNOSIS — E278 Other specified disorders of adrenal gland: Secondary | ICD-10-CM | POA: Diagnosis not present

## 2016-10-27 DIAGNOSIS — Z811 Family history of alcohol abuse and dependence: Secondary | ICD-10-CM | POA: Diagnosis not present

## 2016-10-27 DIAGNOSIS — R600 Localized edema: Secondary | ICD-10-CM | POA: Diagnosis not present

## 2016-10-27 DIAGNOSIS — I10 Essential (primary) hypertension: Secondary | ICD-10-CM | POA: Diagnosis present

## 2016-10-27 DIAGNOSIS — Z800 Family history of malignant neoplasm of digestive organs: Secondary | ICD-10-CM | POA: Diagnosis not present

## 2016-10-27 DIAGNOSIS — E2609 Other primary hyperaldosteronism: Principal | ICD-10-CM | POA: Diagnosis present

## 2016-10-27 DIAGNOSIS — Z841 Family history of disorders of kidney and ureter: Secondary | ICD-10-CM | POA: Diagnosis not present

## 2016-10-27 DIAGNOSIS — Z79899 Other long term (current) drug therapy: Secondary | ICD-10-CM | POA: Insufficient documentation

## 2016-10-27 DIAGNOSIS — D3502 Benign neoplasm of left adrenal gland: Secondary | ICD-10-CM

## 2016-10-27 DIAGNOSIS — Z79890 Hormone replacement therapy (postmenopausal): Secondary | ICD-10-CM | POA: Diagnosis not present

## 2016-10-27 DIAGNOSIS — Z9889 Other specified postprocedural states: Secondary | ICD-10-CM | POA: Insufficient documentation

## 2016-10-27 DIAGNOSIS — J309 Allergic rhinitis, unspecified: Secondary | ICD-10-CM | POA: Diagnosis present

## 2016-10-27 DIAGNOSIS — Z90710 Acquired absence of both cervix and uterus: Secondary | ICD-10-CM | POA: Diagnosis not present

## 2016-10-27 DIAGNOSIS — Z6834 Body mass index (BMI) 34.0-34.9, adult: Secondary | ICD-10-CM | POA: Diagnosis not present

## 2016-10-27 DIAGNOSIS — J45909 Unspecified asthma, uncomplicated: Secondary | ICD-10-CM | POA: Insufficient documentation

## 2016-10-27 DIAGNOSIS — G4730 Sleep apnea, unspecified: Secondary | ICD-10-CM | POA: Insufficient documentation

## 2016-10-27 DIAGNOSIS — Z8249 Family history of ischemic heart disease and other diseases of the circulatory system: Secondary | ICD-10-CM | POA: Insufficient documentation

## 2016-10-27 HISTORY — PX: ROBOTIC ADRENALECTOMY: SHX6407

## 2016-10-27 HISTORY — DX: Other specified abnormal findings of blood chemistry: R79.89

## 2016-10-27 HISTORY — DX: Hypertensive urgency: I16.0

## 2016-10-27 LAB — POCT I-STAT 4, (NA,K, GLUC, HGB,HCT)
GLUCOSE: 103 mg/dL — AB (ref 65–99)
HEMATOCRIT: 39 % (ref 36.0–46.0)
Hemoglobin: 13.3 g/dL (ref 12.0–15.0)
POTASSIUM: 3.6 mmol/L (ref 3.5–5.1)
SODIUM: 142 mmol/L (ref 135–145)

## 2016-10-27 LAB — TYPE AND SCREEN
ABO/RH(D): O POS
Antibody Screen: NEGATIVE

## 2016-10-27 LAB — ABO/RH: ABO/RH(D): O POS

## 2016-10-27 SURGERY — ADRENALECTOMY, ROBOT-ASSISTED
Anesthesia: General | Site: Abdomen | Laterality: Left

## 2016-10-27 MED ORDER — SODIUM CHLORIDE 0.9 % IJ SOLN
INTRAMUSCULAR | Status: AC
  Filled 2016-10-27: qty 10

## 2016-10-27 MED ORDER — ACETAMINOPHEN 500 MG PO TABS
1000.0000 mg | ORAL_TABLET | ORAL | Status: AC
  Administered 2016-10-27: 1000 mg via ORAL
  Filled 2016-10-27: qty 2

## 2016-10-27 MED ORDER — SODIUM CHLORIDE 0.9 % IJ SOLN
INTRAMUSCULAR | Status: AC
  Filled 2016-10-27: qty 50

## 2016-10-27 MED ORDER — CELECOXIB 200 MG PO CAPS
400.0000 mg | ORAL_CAPSULE | ORAL | Status: AC
  Administered 2016-10-27: 400 mg via ORAL
  Filled 2016-10-27: qty 2

## 2016-10-27 MED ORDER — 0.9 % SODIUM CHLORIDE (POUR BTL) OPTIME
TOPICAL | Status: DC | PRN
  Administered 2016-10-27: 1000 mL

## 2016-10-27 MED ORDER — SUGAMMADEX SODIUM 200 MG/2ML IV SOLN
INTRAVENOUS | Status: DC | PRN
  Administered 2016-10-27: 200 mg via INTRAVENOUS

## 2016-10-27 MED ORDER — ONDANSETRON HCL 4 MG/2ML IJ SOLN
INTRAMUSCULAR | Status: AC
  Filled 2016-10-27: qty 2

## 2016-10-27 MED ORDER — DIPHENHYDRAMINE HCL 50 MG/ML IJ SOLN: 12.5000 mg | Freq: Four times a day (QID) | INTRAMUSCULAR | Status: DC | PRN

## 2016-10-27 MED ORDER — LABETALOL HCL 5 MG/ML IV SOLN
INTRAVENOUS | Status: DC | PRN
  Administered 2016-10-27: 5 mg via INTRAVENOUS
  Administered 2016-10-27 (×4): 2.5 mg via INTRAVENOUS

## 2016-10-27 MED ORDER — SODIUM CHLORIDE 0.9% FLUSH
3.0000 mL | Freq: Two times a day (BID) | INTRAVENOUS | Status: DC
  Administered 2016-10-27: 3 mL via INTRAVENOUS

## 2016-10-27 MED ORDER — METHOCARBAMOL 1000 MG/10ML IJ SOLN: 1000.0000 mg | Freq: Four times a day (QID) | INTRAVENOUS | Status: DC | PRN

## 2016-10-27 MED ORDER — BUPIVACAINE LIPOSOME 1.3 % IJ SUSP
20.0000 mL | Freq: Once | INTRAMUSCULAR | Status: AC
  Administered 2016-10-27: 20 mL
  Filled 2016-10-27: qty 20

## 2016-10-27 MED ORDER — ONDANSETRON 4 MG PO TBDP: 4.0000 mg | ORAL_TABLET | Freq: Four times a day (QID) | ORAL | Status: DC | PRN

## 2016-10-27 MED ORDER — HYDROMORPHONE HCL 1 MG/ML IJ SOLN: 0.2500 mg | INTRAMUSCULAR | Status: DC | PRN

## 2016-10-27 MED ORDER — HYDRALAZINE HCL 20 MG/ML IJ SOLN
5.0000 mg | Freq: Four times a day (QID) | INTRAMUSCULAR | Status: DC | PRN
  Administered 2016-10-27: 10 mg via INTRAVENOUS

## 2016-10-27 MED ORDER — HYDROCHLOROTHIAZIDE 12.5 MG PO CAPS
12.5000 mg | ORAL_CAPSULE | Freq: Every day | ORAL | Status: DC
  Administered 2016-10-27: 12.5 mg via ORAL
  Filled 2016-10-27: qty 1

## 2016-10-27 MED ORDER — ADULT MULTIVITAMIN W/MINERALS CH
1.0000 | ORAL_TABLET | Freq: Every day | ORAL | Status: DC
  Administered 2016-10-28: 1 via ORAL
  Filled 2016-10-27: qty 1

## 2016-10-27 MED ORDER — CALCIUM CITRATE-VITAMIN D 250-100 MG-UNIT PO TABS: 1.0000 | ORAL_TABLET | Freq: Two times a day (BID) | ORAL | Status: DC

## 2016-10-27 MED ORDER — LEVOCETIRIZINE DIHYDROCHLORIDE 5 MG PO TABS: 5.0000 mg | ORAL_TABLET | Freq: Every day | ORAL | Status: DC | PRN

## 2016-10-27 MED ORDER — DIPHENHYDRAMINE HCL 12.5 MG/5ML PO ELIX: 12.5000 mg | ORAL_SOLUTION | Freq: Four times a day (QID) | ORAL | Status: DC | PRN

## 2016-10-27 MED ORDER — METOPROLOL TARTRATE 5 MG/5ML IV SOLN: 5.0000 mg | Freq: Four times a day (QID) | INTRAVENOUS | Status: DC | PRN

## 2016-10-27 MED ORDER — LACTATED RINGERS IV SOLN
INTRAVENOUS | Status: DC | PRN
  Administered 2016-10-27 (×2): via INTRAVENOUS

## 2016-10-27 MED ORDER — BUPIVACAINE HCL (PF) 0.5 % IJ SOLN
INTRAMUSCULAR | Status: DC | PRN
  Administered 2016-10-27: 30 mL

## 2016-10-27 MED ORDER — IRBESARTAN 300 MG PO TABS
300.0000 mg | ORAL_TABLET | Freq: Every day | ORAL | Status: DC
  Administered 2016-10-27: 300 mg via ORAL
  Filled 2016-10-27: qty 1

## 2016-10-27 MED ORDER — LIDOCAINE HCL (CARDIAC) 20 MG/ML IV SOLN
INTRAVENOUS | Status: DC | PRN
  Administered 2016-10-27: 100 mg via INTRAVENOUS

## 2016-10-27 MED ORDER — PROPOFOL 10 MG/ML IV BOLUS
INTRAVENOUS | Status: DC | PRN
  Administered 2016-10-27: 30 mg via INTRAVENOUS
  Administered 2016-10-27: 270 mg via INTRAVENOUS
  Administered 2016-10-27: 50 mg via INTRAVENOUS
  Administered 2016-10-27: 30 mg via INTRAVENOUS

## 2016-10-27 MED ORDER — SIMETHICONE 80 MG PO CHEW: 40.0000 mg | CHEWABLE_TABLET | Freq: Four times a day (QID) | ORAL | Status: DC | PRN

## 2016-10-27 MED ORDER — ESCITALOPRAM OXALATE 20 MG PO TABS
20.0000 mg | ORAL_TABLET | Freq: Every day | ORAL | Status: DC
  Administered 2016-10-28: 20 mg via ORAL
  Filled 2016-10-27: qty 1

## 2016-10-27 MED ORDER — ACETAMINOPHEN 325 MG PO TABS: 650.0000 mg | ORAL_TABLET | Freq: Four times a day (QID) | ORAL | Status: DC | PRN

## 2016-10-27 MED ORDER — ASPIRIN EC 81 MG PO TBEC
81.0000 mg | DELAYED_RELEASE_TABLET | Freq: Every day | ORAL | Status: DC
  Administered 2016-10-28: 81 mg via ORAL
  Filled 2016-10-27: qty 1

## 2016-10-27 MED ORDER — METOPROLOL TARTRATE 12.5 MG HALF TABLET: 12.5000 mg | ORAL_TABLET | Freq: Two times a day (BID) | ORAL | Status: DC | PRN

## 2016-10-27 MED ORDER — HYDROMORPHONE HCL 1 MG/ML IJ SOLN
INTRAMUSCULAR | Status: DC | PRN
  Administered 2016-10-27: 0.5 mg via INTRAVENOUS

## 2016-10-27 MED ORDER — ONDANSETRON HCL 4 MG/2ML IJ SOLN
INTRAMUSCULAR | Status: DC | PRN
  Administered 2016-10-27: 4 mg via INTRAVENOUS

## 2016-10-27 MED ORDER — CALCIUM CARBONATE-VITAMIN D 500-200 MG-UNIT PO TABS
1.0000 | ORAL_TABLET | Freq: Two times a day (BID) | ORAL | Status: DC
  Administered 2016-10-27 – 2016-10-28 (×2): 1 via ORAL
  Filled 2016-10-27 (×2): qty 1

## 2016-10-27 MED ORDER — TRAMADOL HCL 50 MG PO TABS
50.0000 mg | ORAL_TABLET | Freq: Four times a day (QID) | ORAL | Status: DC | PRN
  Administered 2016-10-28: 100 mg via ORAL
  Filled 2016-10-27: qty 2

## 2016-10-27 MED ORDER — METOPROLOL SUCCINATE ER 50 MG PO TB24
50.0000 mg | ORAL_TABLET | Freq: Every day | ORAL | Status: DC
  Administered 2016-10-28: 50 mg via ORAL
  Filled 2016-10-27: qty 1

## 2016-10-27 MED ORDER — LACTATED RINGERS IV BOLUS (SEPSIS): 1000.0000 mL | Freq: Three times a day (TID) | INTRAVENOUS | Status: DC | PRN

## 2016-10-27 MED ORDER — SUFENTANIL CITRATE 50 MCG/ML IV SOLN
INTRAVENOUS | Status: DC | PRN
  Administered 2016-10-27: 10 ug via INTRAVENOUS
  Administered 2016-10-27: 5 ug via INTRAVENOUS
  Administered 2016-10-27 (×2): 10 ug via INTRAVENOUS
  Administered 2016-10-27: 20 ug via INTRAVENOUS
  Administered 2016-10-27 (×3): 10 ug via INTRAVENOUS

## 2016-10-27 MED ORDER — CEFAZOLIN SODIUM-DEXTROSE 2-4 GM/100ML-% IV SOLN
2.0000 g | INTRAVENOUS | Status: AC
  Administered 2016-10-27: 2 g via INTRAVENOUS
  Filled 2016-10-27: qty 100

## 2016-10-27 MED ORDER — ONDANSETRON HCL 4 MG/2ML IJ SOLN: 4.0000 mg | Freq: Four times a day (QID) | INTRAMUSCULAR | Status: DC | PRN

## 2016-10-27 MED ORDER — SPIRONOLACTONE 25 MG PO TABS
50.0000 mg | ORAL_TABLET | Freq: Two times a day (BID) | ORAL | Status: DC
  Administered 2016-10-27 – 2016-10-28 (×2): 50 mg via ORAL
  Filled 2016-10-27 (×2): qty 2

## 2016-10-27 MED ORDER — HYDROMORPHONE HCL 2 MG/ML IJ SOLN
INTRAMUSCULAR | Status: AC
  Filled 2016-10-27: qty 1

## 2016-10-27 MED ORDER — DEXAMETHASONE SODIUM PHOSPHATE 10 MG/ML IJ SOLN
INTRAMUSCULAR | Status: DC | PRN
  Administered 2016-10-27: 10 mg via INTRAVENOUS

## 2016-10-27 MED ORDER — LIP MEDEX EX OINT
TOPICAL_OINTMENT | CUTANEOUS | Status: AC
  Filled 2016-10-27: qty 7

## 2016-10-27 MED ORDER — HYDRALAZINE HCL 20 MG/ML IJ SOLN
INTRAMUSCULAR | Status: AC
Start: 2016-10-27 — End: 2016-10-28
  Filled 2016-10-27: qty 1

## 2016-10-27 MED ORDER — GABAPENTIN 300 MG PO CAPS
300.0000 mg | ORAL_CAPSULE | ORAL | Status: AC
  Administered 2016-10-27: 300 mg via ORAL
  Filled 2016-10-27: qty 1

## 2016-10-27 MED ORDER — NITROPRUSSIDE SODIUM 25 MG/ML IV SOLN
0.0000 ug/kg/min | Freq: Once | INTRAVENOUS | Status: DC
  Filled 2016-10-27: qty 2

## 2016-10-27 MED ORDER — BUPIVACAINE HCL (PF) 0.5 % IJ SOLN
INTRAMUSCULAR | Status: AC
  Filled 2016-10-27: qty 30

## 2016-10-27 MED ORDER — PROPOFOL 10 MG/ML IV BOLUS
INTRAVENOUS | Status: AC
Start: 2016-10-27 — End: 2016-10-27
  Filled 2016-10-27: qty 40

## 2016-10-27 MED ORDER — HYDRALAZINE HCL 20 MG/ML IJ SOLN: 10.0000 mg | INTRAMUSCULAR | Status: DC | PRN

## 2016-10-27 MED ORDER — SODIUM CHLORIDE 0.9 % IV SOLN: 250.0000 mL | INTRAVENOUS | Status: DC | PRN

## 2016-10-27 MED ORDER — DEXAMETHASONE SODIUM PHOSPHATE 10 MG/ML IJ SOLN
INTRAMUSCULAR | Status: AC
  Filled 2016-10-27: qty 1

## 2016-10-27 MED ORDER — ACETAMINOPHEN 500 MG PO TABS
1000.0000 mg | ORAL_TABLET | Freq: Three times a day (TID) | ORAL | Status: DC
  Administered 2016-10-27 – 2016-10-28 (×2): 1000 mg via ORAL
  Filled 2016-10-27 (×2): qty 2

## 2016-10-27 MED ORDER — LACTATED RINGERS IV SOLN
INTRAVENOUS | Status: DC | PRN
  Administered 2016-10-27 (×2): via INTRAVENOUS

## 2016-10-27 MED ORDER — FUROSEMIDE 20 MG PO TABS
20.0000 mg | ORAL_TABLET | Freq: Every day | ORAL | Status: DC
  Administered 2016-10-28: 20 mg via ORAL
  Filled 2016-10-27: qty 1

## 2016-10-27 MED ORDER — METHOCARBAMOL 500 MG PO TABS: 1000.0000 mg | ORAL_TABLET | Freq: Four times a day (QID) | ORAL | Status: DC | PRN

## 2016-10-27 MED ORDER — BUTALBITAL-APAP-CAFFEINE 50-325-40 MG PO TABS
1.0000 | ORAL_TABLET | Freq: Three times a day (TID) | ORAL | Status: DC | PRN
  Administered 2016-10-27: 1 via ORAL
  Filled 2016-10-27: qty 1

## 2016-10-27 MED ORDER — HYDROMORPHONE HCL 1 MG/ML IJ SOLN
0.5000 mg | INTRAMUSCULAR | Status: DC | PRN
  Administered 2016-10-27: 0.5 mg via INTRAVENOUS
  Filled 2016-10-27: qty 1

## 2016-10-27 MED ORDER — MIDAZOLAM HCL 5 MG/5ML IJ SOLN
INTRAMUSCULAR | Status: DC | PRN
  Administered 2016-10-27: 2 mg via INTRAVENOUS

## 2016-10-27 MED ORDER — SODIUM CHLORIDE 0.9% FLUSH: 3.0000 mL | INTRAVENOUS | Status: DC | PRN

## 2016-10-27 MED ORDER — SUGAMMADEX SODIUM 200 MG/2ML IV SOLN
INTRAVENOUS | Status: AC
  Filled 2016-10-27: qty 2

## 2016-10-27 MED ORDER — OLMESARTAN MEDOXOMIL-HCTZ 40-12.5 MG PO TABS: 1.0000 | ORAL_TABLET | Freq: Every day | ORAL | Status: DC

## 2016-10-27 MED ORDER — MIDAZOLAM HCL 2 MG/2ML IJ SOLN
INTRAMUSCULAR | Status: AC
  Filled 2016-10-27: qty 2

## 2016-10-27 MED ORDER — METHOCARBAMOL 500 MG PO TABS: 500.0000 mg | ORAL_TABLET | Freq: Four times a day (QID) | ORAL | Status: DC | PRN

## 2016-10-27 MED ORDER — CEFAZOLIN SODIUM-DEXTROSE 2-4 GM/100ML-% IV SOLN
INTRAVENOUS | Status: AC
  Filled 2016-10-27: qty 100

## 2016-10-27 MED ORDER — LACTATED RINGERS IR SOLN
Status: DC | PRN
  Administered 2016-10-27: 3000 mL

## 2016-10-27 MED ORDER — LORATADINE 10 MG PO TABS: 10.0000 mg | ORAL_TABLET | Freq: Every day | ORAL | Status: DC | PRN

## 2016-10-27 MED ORDER — ACETAMINOPHEN 650 MG RE SUPP: 650.0000 mg | Freq: Four times a day (QID) | RECTAL | Status: DC | PRN

## 2016-10-27 MED ORDER — ROCURONIUM BROMIDE 100 MG/10ML IV SOLN
INTRAVENOUS | Status: DC | PRN
  Administered 2016-10-27: 50 mg via INTRAVENOUS
  Administered 2016-10-27 (×2): 10 mg via INTRAVENOUS

## 2016-10-27 MED ORDER — TRAMADOL HCL 50 MG PO TABS: 50.0000 mg | ORAL_TABLET | Freq: Four times a day (QID) | ORAL | 0 refills | Status: DC | PRN

## 2016-10-27 MED ORDER — ENOXAPARIN SODIUM 40 MG/0.4ML ~~LOC~~ SOLN
40.0000 mg | SUBCUTANEOUS | Status: DC
  Administered 2016-10-28: 40 mg via SUBCUTANEOUS
  Filled 2016-10-27: qty 0.4

## 2016-10-27 MED ORDER — SUFENTANIL CITRATE 50 MCG/ML IV SOLN
INTRAVENOUS | Status: AC
  Filled 2016-10-27: qty 2

## 2016-10-27 SURGICAL SUPPLY — 55 items
APPLIER CLIP 5 13 M/L LIGAMAX5 (MISCELLANEOUS)
APPLIER CLIP ROT 10 11.4 M/L (STAPLE)
BLADE SURG SZ11 CARB STEEL (BLADE) ×2 IMPLANT
CHLORAPREP W/TINT 26ML (MISCELLANEOUS) ×2 IMPLANT
CLIP APPLIE 5 13 M/L LIGAMAX5 (MISCELLANEOUS) IMPLANT
CLIP APPLIE ROT 10 11.4 M/L (STAPLE) IMPLANT
CLIP LIGATING HEM O LOK PURPLE (MISCELLANEOUS) ×4 IMPLANT
COVER TIP SHEARS 8 DVNC (MISCELLANEOUS) ×1 IMPLANT
COVER TIP SHEARS 8MM DA VINCI (MISCELLANEOUS) ×1
DECANTER SPIKE VIAL GLASS SM (MISCELLANEOUS) ×2 IMPLANT
DEVICE TROCAR PUNCTURE CLOSURE (ENDOMECHANICALS) ×2 IMPLANT
DRAIN CHANNEL 19F RND (DRAIN) IMPLANT
DRAIN PENROSE 18X1/2 LTX STRL (DRAIN) IMPLANT
DRAPE ARM DVNC X/XI (DISPOSABLE) ×4 IMPLANT
DRAPE COLUMN DVNC XI (DISPOSABLE) ×1 IMPLANT
DRAPE DA VINCI XI ARM (DISPOSABLE) ×4
DRAPE DA VINCI XI COLUMN (DISPOSABLE) ×1
DRAPE WARM FLUID 44X44 (DRAPE) ×2 IMPLANT
DRSG TEGADERM 2-3/8X2-3/4 SM (GAUZE/BANDAGES/DRESSINGS) ×10 IMPLANT
DRSG TEGADERM 4X4.75 (GAUZE/BANDAGES/DRESSINGS) ×4 IMPLANT
ELECT REM PT RETURN 15FT ADLT (MISCELLANEOUS) ×2 IMPLANT
ENDOLOOP SUT PDS II  0 18 (SUTURE)
ENDOLOOP SUT PDS II 0 18 (SUTURE) IMPLANT
EVACUATOR SILICONE 100CC (DRAIN) IMPLANT
GAUZE SPONGE 2X2 8PLY STRL LF (GAUZE/BANDAGES/DRESSINGS) ×1 IMPLANT
GLOVE ECLIPSE 8.0 STRL XLNG CF (GLOVE) ×4 IMPLANT
GLOVE INDICATOR 8.0 STRL GRN (GLOVE) ×4 IMPLANT
GOWN STRL REUS W/TWL XL LVL3 (GOWN DISPOSABLE) ×12 IMPLANT
IRRIG SUCT STRYKERFLOW 2 WTIP (MISCELLANEOUS) ×2
IRRIGATION SUCT STRKRFLW 2 WTP (MISCELLANEOUS) ×1 IMPLANT
KIT BASIN OR (CUSTOM PROCEDURE TRAY) ×2 IMPLANT
NEEDLE HYPO 22GX1.5 SAFETY (NEEDLE) ×2 IMPLANT
NEEDLE INSUFFLATION 14GA 120MM (NEEDLE) ×2 IMPLANT
PACK CARDIOVASCULAR III (CUSTOM PROCEDURE TRAY) ×2 IMPLANT
SCISSORS LAP 5X45 EPIX DISP (ENDOMECHANICALS) ×2 IMPLANT
SEAL CANN UNIV 5-8 DVNC XI (MISCELLANEOUS) ×4 IMPLANT
SEAL XI 5MM-8MM UNIVERSAL (MISCELLANEOUS) ×4
SEALER VESSEL DA VINCI XI (MISCELLANEOUS) ×1
SEALER VESSEL EXT DVNC XI (MISCELLANEOUS) ×1 IMPLANT
SET BI-LUMEN FLTR TB AIRSEAL (TUBING) ×2 IMPLANT
SOLUTION ELECTROLUBE (MISCELLANEOUS) ×2 IMPLANT
SPONGE GAUZE 2X2 STER 10/PKG (GAUZE/BANDAGES/DRESSINGS) ×1
SPONGE LAP 18X18 X RAY DECT (DISPOSABLE) ×2 IMPLANT
SUT MNCRL AB 4-0 PS2 18 (SUTURE) ×2 IMPLANT
SUT PDS AB 1 CTX 36 (SUTURE) ×6 IMPLANT
SUT PROLENE 2 0 SH DA (SUTURE) IMPLANT
SUT V-LOC BARB 180 2/0GR6 GS22 (SUTURE)
SUT VICRYL 0 UR6 27IN ABS (SUTURE) ×2 IMPLANT
SUTURE V-LC BRB 180 2/0GR6GS22 (SUTURE) IMPLANT
SYR 10ML LL (SYRINGE) ×2 IMPLANT
SYR 20CC LL (SYRINGE) ×2 IMPLANT
TOWEL OR 17X26 10 PK STRL BLUE (TOWEL DISPOSABLE) ×2 IMPLANT
TOWEL OR NON WOVEN STRL DISP B (DISPOSABLE) ×2 IMPLANT
TRAY FOLEY W/METER SILVER 16FR (SET/KITS/TRAYS/PACK) IMPLANT
TROCAR ADV FIXATION 5X100MM (TROCAR) ×2 IMPLANT

## 2016-10-27 NOTE — Transfer of Care (Signed)
Immediate Anesthesia Transfer of Care Note  Patient: Amanda Fields  Procedure(s) Performed: Procedure(s): XI ROBOTIC LEFT ADRENALECTOMY (Left)  Patient Location: PACU  Anesthesia Type:General  Level of Consciousness: awake, alert , oriented and patient cooperative  Airway & Oxygen Therapy: Patient Spontanous Breathing and Patient connected to face mask oxygen  Post-op Assessment: Report given to RN, Post -op Vital signs reviewed and stable and Patient moving all extremities X 4  Post vital signs: stable  Last Vitals:  Vitals:   10/27/16 0544 10/27/16 0622  BP: (!) 164/116 (!) 172/116  Pulse: 72 66  Resp: 16   Temp: 36.6 C     Last Pain:  Vitals:   10/27/16 0609  TempSrc:   PainSc: 0-No pain      Patients Stated Pain Goal: 3 (27/06/23 7628)  Complications: No apparent anesthesia complications

## 2016-10-27 NOTE — Discharge Instructions (Signed)
LAPAROSCOPIC SURGERY: POST OP INSTRUCTIONS  ######################################################################  EAT Gradually transition to a high fiber diet with a fiber supplement over the next few weeks after discharge.  Start with a pureed / full liquid diet (see below)  WALK Walk an hour a day.  Control your pain to do that.    CONTROL PAIN Control pain so that you can walk, sleep, tolerate sneezing/coughing, go up/down stairs.  HAVE A BOWEL MOVEMENT DAILY Keep your bowels regular to avoid problems.  OK to try a laxative to override constipation.  OK to use an antidairrheal to slow down diarrhea.  Call if not better after 2 tries  CALL IF YOU HAVE PROBLEMS/CONCERNS Call if you are still struggling despite following these instructions. Call if you have concerns not answered by these instructions  ######################################################################    1. DIET: Follow a light bland diet the first 24 hours after arrival home, such as soup, liquids, crackers, etc.  Be sure to include lots of fluids daily.  Avoid fast food or heavy meals as your are more likely to get nauseated.  Eat a low fat the next few days after surgery.   2. Take your usually prescribed home medications unless otherwise directed. 3. PAIN CONTROL: a. Pain is best controlled by a usual combination of three different methods TOGETHER: i. Ice/Heat ii. Over the counter pain medication iii. Prescription pain medication b. Most patients will experience some swelling and bruising around the incisions.  Ice packs or heating pads (30-60 minutes up to 6 times a day) will help. Use ice for the first few days to help decrease swelling and bruising, then switch to heat to help relax tight/sore spots and speed recovery.  Some people prefer to use ice alone, heat alone, alternating between ice & heat.  Experiment to what works for you.  Swelling and bruising can take several weeks to resolve.   c. It is  helpful to take an over-the-counter pain medication regularly for the first few weeks.  Choose one of the following that works best for you: i. Naproxen (Aleve, etc)  Two 254m tabs twice a day ii. Ibuprofen (Advil, etc) Three 2048mtabs four times a day (every meal & bedtime) iii. Acetaminophen (Tylenol, etc) 500-65047mour times a day (every meal & bedtime) d. A  prescription for pain medication (such as oxycodone, hydrocodone, etc) should be given to you upon discharge.  Take your pain medication as prescribed.  i. If you are having problems/concerns with the prescription medicine (does not control pain, nausea, vomiting, rash, itching, etc), please call us [phone] see if we need to switch you to a different pain medicine that will work better for you and/or control your side effect better. ii. If you need a refill on your pain medication, please contact your pharmacy.  They will contact our office to request authorization. Prescriptions will not be filled after 5 pm or on week-ends. 4. Avoid getting constipated.  Between the surgery and the pain medications, it is common to experience some constipation.  Increasing fluid intake and taking a fiber supplement (such as Metamucil, Citrucel, FiberCon, MiraLax, etc) 1-2 times a day regularly will usually help prevent this problem from occurring.  A mild laxative (prune juice, Milk of Magnesia, MiraLax, etc) should be taken according to package directions if there are no bowel movements after 48 hours.   5. Watch out for diarrhea.  If you have many loose bowel movements, simplify your diet to bland foods & liquids for  a few days.  Stop any stool softeners and decrease your fiber supplement.  Switching to mild anti-diarrheal medications (Kayopectate, Pepto Bismol) can help.  If this worsens or does not improve, please call us. 6. Wash / shower every day.  You may shower over the dressings as they are waterproof.  Continue to shower over incision(s)  after the dressing is off. 7. Remove your waterproof bandages 5 days after surgery.  You may leave the incision open to air.  You may replace a dressing/Band-Aid to cover the incision for comfort if you wish.  8. ACTIVITIES as tolerated:   a. You may resume regular (light) daily activities beginning the next day--such as daily self-care, walking, climbing stairs--gradually increasing activities as tolerated.  If you can walk 30 minutes without difficulty, it is safe to try more intense activity such as jogging, treadmill, bicycling, low-impact aerobics, swimming, etc. b. Save the most intensive and strenuous activity for last such as sit-ups, heavy lifting, contact sports, etc  Refrain from any heavy lifting or straining until you are off narcotics for pain control.   c. DO NOT PUSH THROUGH PAIN.  Let pain be your guide: If it hurts to do something, don't do it.  Pain is your body warning you to avoid that activity for another week until the pain goes down. d. You may drive when you are no longer taking prescription pain medication, you can comfortably wear a seatbelt, and you can safely maneuver your car and apply brakes. e. Dennis Bast may have sexual intercourse when it is comfortable.  9. FOLLOW UP in our office a. Please call [facility] at [phone] to set up an appointment to see your surgeon in the office for a follow-up appointment approximately 2-3 weeks after your surgery. b. Make sure that you call for this appointment the day you arrive home to insure a convenient appointment time. 10. IF YOU HAVE DISABILITY OR FAMILY LEAVE FORMS, BRING THEM TO THE OFFICE FOR PROCESSING.  DO NOT GIVE THEM TO YOUR DOCTOR.   WHEN TO CALL us [phone]: 1. Poor pain control 2. Reactions / problems with new medications (rash/itching, nausea, etc)  3. Fever over 101.5 F (38.5 C) 4. Inability to urinate 5. Nausea and/or vomiting 6. Worsening swelling or bruising 7. Continued bleeding from incision. 8. Increased  pain, redness, or drainage from the incision   The clinic staff is available to answer your questions during regular business hours (8:30am-5pm).  Please dont hesitate to call and ask to speak to one of our nurses for clinical concerns.   If you have a medical emergency, go to the nearest emergency room or call 911.  A surgeon from [facility] is always on call at the [facility], [place], [place], [place], [place]  [address] ? MAIN: [phone] ? TOLL FREE: [phone] ?  FAX [phone] [email or web address]

## 2016-10-27 NOTE — Anesthesia Preprocedure Evaluation (Signed)
Anesthesia Evaluation  Patient identified by MRN, date of birth, ID band Patient awake    Reviewed: Allergy & Precautions, NPO status , Patient's Chart, lab work & pertinent test results  Airway Mallampati: II       Dental   Pulmonary asthma ,    breath sounds clear to auscultation       Cardiovascular hypertension,  Rhythm:Regular Rate:Normal     Neuro/Psych    GI/Hepatic Neg liver ROS, History noted. CE   Endo/Other  negative endocrine ROS  Renal/GU negative Renal ROS     Musculoskeletal   Abdominal   Peds  Hematology   Anesthesia Other Findings   Reproductive/Obstetrics                             Anesthesia Physical Anesthesia Plan  ASA: II  Anesthesia Plan: General   Post-op Pain Management:    Induction: Intravenous  Airway Management Planned: Oral ETT  Additional Equipment:   Intra-op Plan:   Post-operative Plan:   Informed Consent:   Dental advisory given  Plan Discussed with: CRNA and Anesthesiologist  Anesthesia Plan Comments:         Anesthesia Quick Evaluation

## 2016-10-27 NOTE — Anesthesia Procedure Notes (Signed)
Procedure Name: Intubation Date/Time: 10/27/2016 7:47 AM Performed by: Lissa Morales Pre-anesthesia Checklist: Patient identified, Emergency Drugs available, Suction available and Patient being monitored Patient Re-evaluated:Patient Re-evaluated prior to inductionOxygen Delivery Method: Circle system utilized Preoxygenation: Pre-oxygenation with 100% oxygen Intubation Type: IV induction Ventilation: Mask ventilation without difficulty Laryngoscope Size: Mac and 4 Grade View: Grade II Tube type: Subglottic suction tube (taper tube 2ndary to  long case) Tube size: 7.5 mm Number of attempts: 1 Airway Equipment and Method: Stylet and Oral airway Placement Confirmation: ETT inserted through vocal cords under direct vision,  positive ETCO2 and breath sounds checked- equal and bilateral Secured at: 21 cm Tube secured with: Tape Dental Injury: Teeth and Oropharynx as per pre-operative assessment

## 2016-10-27 NOTE — Anesthesia Postprocedure Evaluation (Signed)
Anesthesia Post Note  Patient: Amanda Fields  Procedure(s) Performed: Procedure(s) (LRB): XI ROBOTIC LEFT ADRENALECTOMY (Left)  Patient location during evaluation: PACU Anesthesia Type: General Level of consciousness: awake Pain management: pain level controlled Vital Signs Assessment: post-procedure vital signs reviewed and stable Respiratory status: spontaneous breathing Anesthetic complications: no    Last Vitals:  Vitals:   10/27/16 1300 10/27/16 1315  BP: (!) 140/126 (!) 172/113  Pulse: 82 77  Resp: 19 16  Temp:      Last Pain:  Vitals:   10/27/16 1245  TempSrc:   PainSc: 0-No pain                 EDWARDS,Hosanna Betley

## 2016-10-27 NOTE — Interval H&P Note (Signed)
History and Physical Interval Note:  10/27/2016 7:29 AM  Amanda Fields  has presented today for surgery, with the diagnosis of Primary hyperaldosteronism  The various methods of treatment have been discussed with the patient and family. After consideration of risks, benefits and other options for treatment, the patient has consented to  Procedure(s): XI ROBOTIC LEFT ADRENALECTOMY (Left) as a surgical intervention .  The patient's history has been reviewed, patient examined, no change in status, stable for surgery.  I have reviewed the patient's chart and labs.  Questions were answered to the patient's satisfaction.     Corde Antonini C.

## 2016-10-27 NOTE — Op Note (Signed)
10/27/2016  10:45 AM  PATIENT:  Amanda Fields  40 y.o. 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  INDICATION: Pleasant young woman struggle with poorly controlled hypertension for several decades.  Underwent workup.  She was found to have to have hyperaldosteronism.   workup revealed no other hormonal problems.  She did not seem to have an obvious discrete adrenal mass on CT scan, but venous sampling pre-and post stimulation showed a marked discrepancy and elevation on the left side.  Recommendation made for adrenalectomy to better to hypertension prevent lifelong other issues.  The anatomy and physiology of the adrenal gland was discussed.  Pathophysiology of the adrenal problem was discussed.  Options were discussed, and I made a recommendation to remove the adrenal gland & to treat the pathology.  Minimally invasive & open techniques discussed.  Risks of bleeding, infection, injury to other organs, need for repair of tissues / organs  reoperation, death, and other risks were discussed.   I noted a good likelihood this will help address the problem.  While there are risks, I feel the risks of nonoperative management are greater; therefore, I feel surgery offers the best option. Educational material was available.  We will work to minimize complications.     OR FINDINGS: Normal size left adrenal gland on superomedial apex of left kidney.  Accessory spleen.  No other abnormalities.  DESCRIPTION:   Informed consent was confirmed.  The patient underwent general anaesthesia without difficulty.  The patient was positioned appropriately.  VTE prevention in place.  The patient's abdomen was clipped, prepped, & draped in a sterile fashion.  Surgical timeout confirmed our plan.  The patient was positioned in reverse Trendelenburg.  Abdominal entry with a 41m laparoscopic port was gained using optical entry technique in the left upper abdomen.  Entry was clean.  I induced carbon dioxide insufflation.  Camera inspection revealed no injury.  Extra ports were carefully placed under direct laparoscopic visualization.  Exira robot docked carefully.  Freed off adhesions to the left upper quadrant.  I mobilized the splenic flexure of the colon from the mid descending colon up towards the spleen and a lateral to medial fashion.  Yet between the plane of the splenic flexure of the colon and Gerota's fascia of the kidney.  I continued more cephalad to mobilize the pancreas tail as well as the spleen and the lateral medial fashion to better expose the retroperitoneum.  She had a moderate amount of retroperitoneal and intra-abdominal fat.   We gradually were able to mobilize  the left upper quadrant organs off the retroperitoneum  to expose the medial border of the kidney and follow it cephalad.  Could see a subtle but definite demarcation suspicious for the adrenal gland.    Switched over from cautery scissors to vessel sealer.  Began to go through  Gerona's fascia for a 1 cm cuff around the  presumed borders of the left adrenal gland.  Came around laterally and inferiorly.  Gently elevated the adrenal gland in a lateral to medial fashion  to confirm the classic cadmium yellow changes consistent with the adrenal gland that were much more obvious on the posterior renal side..  I then  transected through Gerota's fascia along the  inferior border of the adrenal gland.  I carefully dissected and skeletonized and took that and layer by layer in a lateral to medial fashion.  With gentle elevation I could see  tension suspicious for the left adrenal vein.  I did some skeletonization on the medial border and came down to the inferior corner.  Carefully skeletonized and identified an obvious adrenal vein.  We ligated that with a plastic Hemoclip on the retroperitoneal side.  I then transected the adrenal vein with the vessel sealer on the adrenal side.  With that I was able to mobilize the adrenal gland in a inferior to superior fashion and release final attachments to the retroperitoneum and kidney specially.    There were some with see wispy arterial branches on the inferomedial medial borders was able to carefully transect transect with the vessel sealer quite easily.   I focused on taking a healthy cuff of the Gerona's fascia circumferentially to have good margins around  the adrenal gland.  It freed off well and removed.    We did careful inspection.  Did irrigation.   Hemostasis was excellent.   Remaining ornaments and tissues uninjured.  We allowed the pancreatic tail, spleen, and splenic flexure of the colon to return to their more natural state, abutting the retroperitoneum and left upper quadrant.  We placed the adrenal gland in an Endo Catch bag.  Brought it out the left paramedian port site after extending it to a 3 cm defect to get the adrenal gland out.  Reapproximated the fascia defect of this extraction site using #1 PDS interrupted suture with a  laparoscopic suture passer in a vertical interrupted fashion to good result.  I did reinspection and irrigation laparoscopically with the robotic camera and instruments.  Hemostasis was good..   Capnoperitoneum evacuated   Ports removed.  Skin closed with 4 Monocryl suture.  Sterile dressing applied.  The patient being extubated to recovery room.  Her blood pressure had been elevated  preoperatively but better controlled intraoperatively.  We will follow her at least overnight.  She has a friend here that I will discuss postoperative findings intraoperative findings and postop care with them.         Adin Hector, M.D., F.A.C.S. Gastrointestinal and Minimally Invasive Surgery [facility] [address], [place], [place] [address] [phone] Main / Paging

## 2016-10-28 DIAGNOSIS — E2609 Other primary hyperaldosteronism: Secondary | ICD-10-CM | POA: Diagnosis not present

## 2016-10-28 NOTE — Progress Notes (Signed)
Patient ID: Amanda Fields, female   DOB: [birth year], 40 y.o.   MRN: [id number] 1 Day Post-Op  Subjective: Some pain at incisions but controlled with medications and has not used any since last night. Tolerating breakfast. Getting up out of bed.  Objective: Vital signs in last 24 hours: Temp:  [97.4 F (36.3 C)-99.3 F (37.4 C)] 98.8 F (37.1 C) (11/11 0549) Pulse Rate:  [68-99] 91 (11/11 0549) Resp:  [10-19] 16 (11/11 0549) BP: (102-185)/(74-149) 102/87 (11/11 0549) SpO2:  [94 %-100 %] 97 % (11/11 0549) Arterial Line BP: (182-189)/(115-119) 189/119 (11/10 1145)    Intake/Output from previous day: 11/10 0701 - 11/11 0700 In: 2600 [I.V.:2400] Out: 1450 [Urine:1350; Blood:100] Intake/Output this shift: No intake/output data recorded.  General appearance: alert, cooperative and no distress GI: normal findings: soft, non-tender Incision/Wound: Dressings clean and dry  Lab Results:   Recent Labs  10/27/16 0727  HGB 13.3  HCT 39.0   BMET  Recent Labs  10/27/16 0727  NA 142  K 3.6  GLUCOSE 103*     Studies/Results: No results found.  Anti-infectives: Anti-infectives    Start     Dose/Rate Route Frequency Ordered Stop   10/27/16 0600  ceFAZolin (ANCEF) IVPB 2g/100 mL premix     2 g 200 mL/hr over 30 Minutes Intravenous On call to O.R. 10/27/16 0551 10/27/16 0823      Assessment/Plan: s/p Procedure(s): XI ROBOTIC LEFT ADRENALECTOMY Doing very well postoperatively without apparent complication. Appears stable for discharge today.   LOS: 1 day    Zvi Duplantis T 10/28/2016

## 2016-10-28 NOTE — Progress Notes (Signed)
Assessment unchanged. Pt verbalized understanding of dc instructions through teach back including follow up care and when to call the doctor. Script x 1 given as provided by MD. Pt discharged via foot per request accompanied by family and NT.

## 2016-10-30 ENCOUNTER — Encounter (HOSPITAL_COMMUNITY): Payer: Self-pay | Admitting: Surgery

## 2016-10-30 NOTE — Discharge Summary (Signed)
Physician Discharge Summary  Patient ID: Amanda Fields MRN: [id number] DOB/AGE: 05-19-1976 40 y.o.  Admit date: 10/27/2016 Discharge date: 10/28/2016  Patient Care Team: Roselee Nova, MD as PCP - General (Family Medicine) Michael Boston, MD as Consulting Physician (General Surgery) Renato Shin, MD as Consulting Physician (Endocrinology)  Admission Diagnoses: Principal Problem:   Primary hyperaldosteronism s/p robotic left adrenalectomy 10/27/2016 Active Problems:   Hypertension   Bilateral leg edema   Allergic rhinitis   Adenoma of left adrenal gland   Discharge Diagnoses:  Principal Problem:   Primary hyperaldosteronism s/p robotic left adrenalectomy 10/27/2016 Active Problems:   Hypertension   Bilateral leg edema   Allergic rhinitis   Adenoma of left adrenal gland   POST-OPERATIVE DIAGNOSIS:   Primary hyperaldosteronism  SURGERY:  10/27/2016  Procedure(s): XI ROBOTIC LEFT ADRENALECTOMY  SURGEON:    Surgeon(s): Michael Boston, MD Ralene Ok, MD  Consults: None  Hospital Course:   The patient underwent the surgery above.  Postoperatively, the patient gradually mobilized and advanced to a solid diet.  Pain and other symptoms were treated aggressively.    By the time of discharge, the patient was walking well the hallways, eating food, having flatus.  Pain was well-controlled on an oral medications.  HTN much less & better controlled.  Based on meeting discharge criteria and continuing to recover, I felt it was safe for the patient to be discharged from the hospital to further recover with close followup. Postoperative recommendations were discussed in detail.  They are written as well.   Significant Diagnostic Studies:  No results found for this or any previous visit (from the past 72 hour(s)).  No results found.  Discharge Exam: Blood pressure 102/87, pulse 91, temperature 98.8 F (37.1 C), temperature source Oral, resp. rate 16, height 5' 6"   (1.676 m), weight 95.3 kg (210 lb), SpO2 97 %.  General: Pt awake/alert/oriented x4 in no major acute distress Eyes: PERRL, normal EOM. Sclera nonicteric Neuro: CN II-XII intact w/o focal sensory/motor deficits. Lymph: No head/neck/groin lymphadenopathy Psych:  No delerium/psychosis/paranoia HENT: Normocephalic, Mucus membranes moist.  No thrush Neck: Supple, No tracheal deviation Chest: No pain.  Good respiratory excursion. CV:  Pulses intact.  Regular rhythm MS: Normal AROM mjr joints.  No obvious deformity Abdomen: Soft, Nondistended.  Min tender at L abd extraction incision.  No incarcerated hernias. Ext:  SCDs BLE.  No significant edema.  No cyanosis Skin: No petechiae / purpura  Discharged Condition: good   Past Medical History:  Diagnosis Date  . Adrenal mass, left ([place])   . Anemia    none since Hysterectomy done-related to heavy menses  . Asthma    Related to allergies"wheezes with pollen exposure" no asthma attacks  . Breast lipoma 09/06/2011   resolved- no surgery-tx. medically.  . Cough due to ACE inhibitor   . Dizziness   . Edema leg   . Elevated troponin I level 12/31/2015  . Endometriosis   . Hypertension    a. echo 03/2012 EF 60-65%, moderate LVH, nl LV diastolic fxn, nl PASP; b. echo 12/2015: EF 55-60%  . Hypertensive urgency 12/31/2015  . Migraine headache    migrianes-less frequent now  . Obesity   . Palpitation     Past Surgical History:  Procedure Laterality Date  . IR GENERIC HISTORICAL  07/07/2016   IR VENOGRAM RENAL UNI LEFT 07/07/2016 Aletta Edouard, MD [facility]  . IR GENERIC HISTORICAL  07/07/2016   IR VENOUS SAMPLING 07/07/2016 Alet[facility] Edouard, MD [facility]  . IR GENERIC HISTORICAL  07/07/2016   IR VENOGRAM ADRENAL BI 07/07/2016 Aletta Edouard, MD [facility]  . IR GENERIC HISTORICAL  07/07/2016   IR US GUIDE VASC ACCESS RIGHT 07/07/2016 Aletta Edouard, MD [facility]  . IR GENERIC HISTORICAL  07/07/2016   IR ANGIOGRAM SELECTIVE EACH  ADDITIONAL VESSEL 07/07/2016 Aletta Edouard, MD [facility]  . IR GENERIC HISTORICAL  07/07/2016   IR VENOUS SAMPLING 07/07/2016 Aletta Edouard, MD [facility]  . IR GENERIC HISTORICAL  07/07/2016   IR US GUIDE VASC ACCESS RIGHT 07/07/2016 Aletta Edouard, MD [facility]  . LAPAROSCOPIC SUPRACERVICAL HYSTERECTOMY  09/27/2010   For endometriosis. Ovaries anf tubes are in place.  Marland Kitchen NASAL SEPTUM SURGERY    . TONSILLECTOMY    . TUBAL LIGATION  2006  . WISDOM TOOTH EXTRACTION      Social History   Social History  . Marital status: Single    Spouse name: N/A  . Number of children: 2  . Years of education: N/A   Occupational History  . call center    Social History Main Topics  . Smoking status: Never Smoker  . Smokeless tobacco: Never Used  . Alcohol use No     Comment: social  . Drug use: No  . Sexual activity: Yes    Partners: Male    Birth control/ protection: Surgical   Other Topics Concern  . Not on file   Social History Narrative  . No narrative on file    Family History  Problem Relation Age of Onset  . COPD Mother   . Hypotension Mother   . Colon cancer Father   . Emphysema Father   . Cancer Father     colon  . Hyperlipidemia Sister   . Heart failure Paternal Uncle   . Heart attack Cousin 32  . Heart disease Cousin     heart attack  . Adrenal disorder Neg Hx     No current facility-administered medications for this encounter.    Current Outpatient Prescriptions  Medication Sig Dispense Refill  . calcium-vitamin D 250-100 MG-UNIT tablet Take 1 tablet by mouth 2 (two) times daily. (Patient not taking: Reported on 10/26/2016) 60 tablet 3  . escitalopram (LEXAPRO) 20 MG tablet Take 1 tablet (20 mg total) by mouth daily. (Patient not taking: Reported on 10/26/2016) 31 tablet 4  . furosemide (LASIX) 20 MG tablet Take 20 mg by mouth daily.    Marland Kitchen levocetirizine (XYZAL) 5 MG tablet Take 5 mg by mouth daily as needed for allergies.     . metoprolol succinate  (TOPROL-XL) 50 MG 24 hr tablet Take 1 tablet (50 mg total) by mouth daily. 90 tablet 3  . multivitamin-iron-minerals-folic acid (CENTRUM) chewable tablet Chew 1 tablet by mouth daily.      Marland Kitchen spironolactone (ALDACTONE) 50 MG tablet Take 1 tablet (50 mg total) by mouth 2 (two) times daily. 60 tablet 11  . aspirin EC 81 MG EC tablet Take 1 tablet (81 mg total) by mouth daily. (Patient not taking: Reported on 10/26/2016) 30 tablet 0  . estradiol (VIVELLE-DOT) 0.025 MG/24HR Place 1 patch onto the skin 2 (two) times a week. (Patient not taking: Reported on 10/27/2016) 8 patch 12  . olmesartan-hydrochlorothiazide (BENICAR HCT) 40-12.5 MG tablet Take 1 tablet by mouth at bedtime.    . traMADol (ULTRAM) 50 MG tablet Take 1-2 tablets (50-100 mg total) by mouth every 6 (six) hours as needed for moderate pain or severe pain. 30 tablet  0   Facility-Administered Medications Ordered in Other Encounters  Medication Dose Route Frequency Provider Last Rate Last Dose  . cosyntropin (CORTROSYN) injection 0.05 mg  0.05 mg Intravenous Once Monia Sabal, PA-C         Allergies  Allergen Reactions  . Ace Inhibitors Cough  . Latex Itching    gloves    Disposition: 01-Home or Self Care  Discharge Instructions    Call MD for:    Complete by:  As directed    Temperature > 101.88F   Call MD for:  extreme fatigue    Complete by:  As directed    Call MD for:  hives    Complete by:  As directed    Call MD for:  persistant nausea and vomiting    Complete by:  As directed    Call MD for:  redness, tenderness, or signs of infection (pain, swelling, redness, odor or green/yellow discharge around incision site)    Complete by:  As directed    Call MD for:  severe uncontrolled pain    Complete by:  As directed    Diet - low sodium heart healthy    Complete by:  As directed    Start with bland, low residue diet for a few days, then advance to a heart healthy (low fat, high fiber) diet.  If you feel nauseated or  constipated, simplify to a liquid only diet for 48 hours until you are feeling better (no more nausea, farting/passing gas, having a bowel movement, etc...).  If you cannot tolerate even drinking liquids, or feeling worse, let your surgeon know or go to the Emergency Department for help.   Discharge instructions    Complete by:  As directed    Please see discharge instruction sheets.   Also refer to any handouts/printouts that may have been given from the [facility] surgery office (if you visited [place] there before surgery) Please call our office if you have any questions or concerns [phone]   Discharge patient    Complete by:  As directed    Discharge wound care:    Complete by:  As directed    If you have closed incisions: Shower and bathe over these incisions with soap and water every day.  It is OK to wash over the dressings: they are waterproof. Remove all surgical dressings on postoperative day #3.  You do not need to replace dressings over the closed incisions unless you feel more comfortable with a Band-Aid covering it.   If you have an open wound: That requires packing, so please see wound care instructions.   In general, remove all dressings, wash wound with soap and water and then replace with saline moistened gauze.  Do the dressing change at least every day.    Please call our office [phone] if you have further questions.   Driving Restrictions    Complete by:  As directed    No driving until off narcotics and can safely swerve away without pain during an emergency   Increase activity slowly    Complete by:  As directed    Lifting restrictions    Complete by:  As directed    Avoid heavy lifting initially, <20 pounds at first.   Do not push through pain.   You have no specific weight limit: If it hurts to do, DON'T DO IT.    If you feel no pain, you are not injuring anything.  Pain will protect you from injury.  Coughing and sneezing are far more stressful to your  incision than any lifting.   Avoid resuming heavy lifting (>50 pounds) or other intense activity until off all narcotic pain medications.   When want to exercise more, give yourself 2 weeks to gradually get back to full intense exercise/activity.   May shower / Bathe    Complete by:  As directed    [place].  It is fine for dressings or wounds to be washed/rinsed.  Use gentle soap & water.  This will help the incisions and/or wounds get clean & minimize infection.   May walk up steps    Complete by:  As directed    Sexual Activity Restrictions    Complete by:  As directed    Sexual activity as tolerated.  Do not push through pain.  Pain will protect you from injury.   Walk with assistance    Complete by:  As directed    Walk over an hour a day.  May use a walker/cane/companion to help with balance and stamina.       Medication List    TAKE these medications   aspirin 81 MG EC tablet Take 1 tablet (81 mg total) by mouth daily.   calcium-vitamin D 250-100 MG-UNIT tablet Take 1 tablet by mouth 2 (two) times daily.   escitalopram 20 MG tablet Commonly known as:  LEXAPRO Take 1 tablet (20 mg total) by mouth daily.   estradiol 0.025 MG/24HR Commonly known as:  VIVELLE-DOT Place 1 patch onto the skin 2 (two) times a week.   furosemide 20 MG tablet Commonly known as:  LASIX Take 20 mg by mouth daily.   levocetirizine 5 MG tablet Commonly known as:  XYZAL Take 5 mg by mouth daily as needed for allergies.   metoprolol succinate 50 MG 24 hr tablet Commonly known as:  TOPROL-XL Take 1 tablet (50 mg total) by mouth daily.   multivitamin-iron-minerals-folic acid chewable tablet Chew 1 tablet by mouth daily.   olmesartan-hydrochlorothiazide 40-12.5 MG tablet Commonly known as:  BENICAR HCT Take 1 tablet by mouth at bedtime.   spironolactone 50 MG tablet Commonly known as:  ALDACTONE Take 1 tablet (50 mg total) by mouth 2 (two) times daily.   traMADol 50 MG  tablet Commonly known as:  ULTRAM Take 1-2 tablets (50-100 mg total) by mouth every 6 (six) hours as needed for moderate pain or severe pain.      Follow-up Information    Chloris Marcoux C., MD. Schedule an appointment as soon as possible for a visit in 3 weeks.   Specialty:  General Surgery Why:  To follow up after your operation, To follow up after your hospital stay Contact information: [address] [place] [address] [phone]            Signed: Morton Peters, M.D., F.A.C.S. Gastrointestinal and Minimally Invasive Surgery [facility] [address] [place], [place] [address] [phone] Main / Paging   10/30/2016, 7:38 AM

## 2016-11-14 ENCOUNTER — Encounter: Payer: Self-pay | Admitting: Surgery

## 2016-11-14 DIAGNOSIS — Q8909 Congenital malformations of spleen: Secondary | ICD-10-CM | POA: Insufficient documentation

## 2016-11-15 ENCOUNTER — Ambulatory Visit (INDEPENDENT_AMBULATORY_CARE_PROVIDER_SITE_OTHER): Payer: Medicaid Other | Admitting: Obstetrics and Gynecology

## 2016-11-15 ENCOUNTER — Encounter: Payer: Self-pay | Admitting: Obstetrics and Gynecology

## 2016-11-15 VITALS — BP 151/102 | HR 73 | Wt 210.0 lb

## 2016-11-15 DIAGNOSIS — R87612 Low grade squamous intraepithelial lesion on cytologic smear of cervix (LGSIL): Secondary | ICD-10-CM | POA: Diagnosis not present

## 2016-11-15 NOTE — Progress Notes (Signed)
40 yo with LGSIL on 10/06/16 pap smear here for colposcopy  Patient given informed consent, signed copy in the chart, time out was performed.  Placed in lithotomy position. Cervix viewed with speculum and colposcope after application of acetic acid.   Colposcopy adequate?  yes Acetowhite lesions? Yes at 6 o'clock Punctation? no Mosaicism?  no Abnormal vasculature?  no Biopsies? Yes 6 o'clock ECC? yes  COMMENTS:  Patient was given post procedure instructions.  She will return in 2 weeks for results.  Mora Bellman, MD

## 2016-11-17 ENCOUNTER — Telehealth: Payer: Self-pay | Admitting: *Deleted

## 2016-11-17 NOTE — Telephone Encounter (Signed)
-----   Message from Mora Bellman, MD sent at 11/16/2016  9:04 PM EST ----- Please inform patient of biopsy results consistent with pap smear. Plan is to repeat pap smear in 1 year.   Thanks  [employer]

## 2016-11-17 NOTE — Telephone Encounter (Signed)
Informed pt of colpo result and to follow-up in one year for a repeat pap smear, pt acknowledged.

## 2016-11-20 ENCOUNTER — Encounter: Payer: Self-pay | Admitting: *Deleted

## 2016-12-28 ENCOUNTER — Ambulatory Visit (INDEPENDENT_AMBULATORY_CARE_PROVIDER_SITE_OTHER): Payer: Medicaid Other | Admitting: Endocrinology

## 2016-12-28 DIAGNOSIS — Z0289 Encounter for other administrative examinations: Secondary | ICD-10-CM

## 2017-01-14 ENCOUNTER — Other Ambulatory Visit: Payer: Self-pay | Admitting: Physician Assistant

## 2017-01-14 DIAGNOSIS — Z780 Asymptomatic menopausal state: Secondary | ICD-10-CM

## 2017-01-15 ENCOUNTER — Encounter: Payer: Self-pay | Admitting: Family Medicine

## 2017-01-15 ENCOUNTER — Ambulatory Visit (INDEPENDENT_AMBULATORY_CARE_PROVIDER_SITE_OTHER): Payer: Managed Care, Other (non HMO) | Admitting: Family Medicine

## 2017-01-15 VITALS — BP 126/82 | HR 65 | Temp 98.8°F | Resp 16 | Ht 66.0 in | Wt 213.9 lb

## 2017-01-15 DIAGNOSIS — I152 Hypertension secondary to endocrine disorders: Secondary | ICD-10-CM | POA: Diagnosis not present

## 2017-01-15 DIAGNOSIS — J0100 Acute maxillary sinusitis, unspecified: Secondary | ICD-10-CM | POA: Diagnosis not present

## 2017-01-15 MED ORDER — AZITHROMYCIN 250 MG PO TABS: ORAL_TABLET | ORAL | 0 refills | Status: DC

## 2017-01-15 MED ORDER — OLMESARTAN MEDOXOMIL-HCTZ 40-12.5 MG PO TABS: 1.0000 | ORAL_TABLET | Freq: Every day | ORAL | 1 refills | Status: DC

## 2017-01-15 NOTE — Progress Notes (Signed)
Name: Amanda Fields   MRN: [id number]    DOB: 15-Jul-1976   Date:01/15/2017       Progress Note  Subjective  Chief Complaint  Chief Complaint  Patient presents with  . Hypertension  . Sinusitis    Hypertension  This is a chronic problem. The problem is unchanged. The problem is controlled. Associated symptoms include headaches. Pertinent negatives include no blurred vision, chest pain, orthopnea, palpitations or shortness of breath. Past treatments include diuretics, angiotensin blockers and beta blockers. There is no history of kidney disease, CAD/MI or CVA.  Sinusitis  This is a new problem. The current episode started 1 to 4 weeks ago (2 weeks ago). There has been no fever. Associated symptoms include congestion, coughing, headaches and sinus pressure. Pertinent negatives include no shortness of breath. Past treatments include oral decongestants (Coricidin HBP, Dayquil HBP). The treatment provided no relief.     Past Medical History:  Diagnosis Date  . Adrenal mass, left ([place])   . Anemia    none since Hysterectomy done-related to heavy menses  . Asthma    Related to allergies"wheezes with pollen exposure" no asthma attacks  . Breast lipoma 09/06/2011   resolved- no surgery-tx. medically.  . Cough due to ACE inhibitor   . Dizziness   . Edema leg   . Elevated troponin I level 12/31/2015  . Endometriosis   . Hypertension    a. echo 03/2012 EF 60-65%, moderate LVH, nl LV diastolic fxn, nl PASP; b. echo 12/2015: EF 55-60%  . Hypertensive urgency 12/31/2015  . Migraine headache    migrianes-less frequent now  . Obesity   . Palpitation     Past Surgical History:  Procedure Laterality Date  . IR GENERIC HISTORICAL  07/07/2016   IR VENOGRAM RENAL UNI LEFT 07/07/2016 Aletta Edouard, MD [facility]  . IR GENERIC HISTORICAL  07/07/2016   IR VENOUS SAMPLING 07/07/2016 Aletta Edouard, MD [facility]  . IR GENERIC HISTORICAL  07/07/2016   IR VENOGRAM ADRENAL BI 07/07/2016 Aletta Edouard, MD [facility]  . IR GENERIC HISTORICAL  07/07/2016   IR US GUIDE VASC ACCESS RIGHT 07/07/2016 Aletta Edouard, MD [facility]  . IR GENERIC HISTORICAL  07/07/2016   IR ANGIOGRAM SELECTIVE EACH ADDITIONAL VESSEL 07/07/2016 Aletta Edouard, MD [facility]  . IR GENERIC HISTORICAL  07/07/2016   IR VENOUS SAMPLING 07/07/2016 Aletta Edouard, MD [facility]  . IR GENERIC HISTORICAL  07/07/2016   IR US GUIDE VASC ACCESS RIGHT 07/07/2016 Aletta Edouard, MD [facility]  . LAPAROSCOPIC SUPRACERVICAL HYSTERECTOMY  09/27/2010   For endometriosis. Ovaries anf tubes are in place.  Marland Kitchen NASAL SEPTUM SURGERY    . ROBOTIC ADRENALECTOMY Left 10/27/2016   Procedure: XI ROBOTIC LEFT ADRENALECTOMY;  Surgeon: Michael Boston, MD;  Location: [facility];  Service: General;  Laterality: Left;  . TONSILLECTOMY    . TUBAL LIGATION  2006  . WISDOM TOOTH EXTRACTION      Family History  Problem Relation Age of Onset  . COPD Mother   . Hypotension Mother   . Colon cancer Father   . Emphysema Father   . Cancer Father     colon  . Hyperlipidemia Sister   . Heart failure Paternal Uncle   . Heart attack Cousin 32  . Heart disease Cousin     heart attack  . Adrenal disorder Neg Hx     Social History   Social History  . Marital status: Single    Spouse name: N/A  .  Number of children: 2  . Years of education: N/A   Occupational History  . call center    Social History Main Topics  . Smoking status: Never Smoker  . Smokeless tobacco: Never Used  . Alcohol use No     Comment: social  . Drug use: No  . Sexual activity: Yes    Partners: Male    Birth control/ protection: Surgical   Other Topics Concern  . Not on file   Social History Narrative  . No narrative on file     Current Outpatient Prescriptions:  .  aspirin EC 81 MG EC tablet, Take 1 tablet (81 mg total) by mouth daily., Disp: 30 tablet, Rfl: 0 .  calcium-vitamin D 250-100 MG-UNIT tablet, Take 1 tablet by mouth 2 (two) times  daily., Disp: 60 tablet, Rfl: 3 .  escitalopram (LEXAPRO) 20 MG tablet, Take 1 tablet (20 mg total) by mouth daily., Disp: 31 tablet, Rfl: 4 .  estradiol (VIVELLE-DOT) 0.025 MG/24HR, Place 1 patch onto the skin 2 (two) times a week., Disp: 8 patch, Rfl: 12 .  furosemide (LASIX) 20 MG tablet, Take 20 mg by mouth daily., Disp: , Rfl:  .  levocetirizine (XYZAL) 5 MG tablet, Take 5 mg by mouth daily as needed for allergies. , Disp: , Rfl:  .  metoprolol succinate (TOPROL-XL) 50 MG 24 hr tablet, Take 1 tablet (50 mg total) by mouth daily., Disp: 90 tablet, Rfl: 3 .  multivitamin-iron-minerals-folic acid (CENTRUM) chewable tablet, Chew 1 tablet by mouth daily.  , Disp: , Rfl:  .  olmesartan-hydrochlorothiazide (BENICAR HCT) 40-12.5 MG tablet, Take 1 tablet by mouth at bedtime., Disp: , Rfl:  .  spironolactone (ALDACTONE) 50 MG tablet, Take 1 tablet (50 mg total) by mouth 2 (two) times daily., Disp: 60 tablet, Rfl: 11 .  traMADol (ULTRAM) 50 MG tablet, Take 1-2 tablets (50-100 mg total) by mouth every 6 (six) hours as needed for moderate pain or severe pain. (Patient not taking: Reported on 01/15/2017), Disp: 30 tablet, Rfl: 0 No current facility-administered medications for this visit.   Facility-Administered Medications Ordered in Other Visits:  .  cosyntropin (CORTROSYN) injection 0.05 mg, 0.05 mg, Intravenous, Once, Monia Sabal, PA-C  Allergies  Allergen Reactions  . Ace Inhibitors Cough  . Latex Itching    gloves     Review of Systems  HENT: Positive for congestion and sinus pressure.   Eyes: Negative for blurred vision.  Respiratory: Positive for cough. Negative for shortness of breath.   Cardiovascular: Negative for chest pain, palpitations and orthopnea.  Neurological: Positive for headaches.     Objective  Vitals:   01/15/17 1130  BP: 126/82  Pulse: 65  Resp: 16  Temp: 98.8 F (37.1 C)  TempSrc: Oral  SpO2: 98%  Weight: 213 lb 14.4 oz (97 kg)  Height: 5' 6"  (1.676 m)     Physical Exam  Constitutional: She is oriented to person, place, and time and well-developed, well-nourished, and in no distress.  HENT:  Head: Normocephalic and atraumatic.  Right Ear: Tympanic membrane and ear canal normal. No drainage or swelling.  Left Ear: Tympanic membrane and ear canal normal. No drainage or swelling.  Nose: Right sinus exhibits maxillary sinus tenderness. Left sinus exhibits maxillary sinus tenderness.  Mouth/Throat: No posterior oropharyngeal erythema.  Cardiovascular: Normal rate, regular rhythm and normal heart sounds.   No murmur heard. Pulmonary/Chest: Effort normal and breath sounds normal. She has no wheezes.  Neurological: She is alert and oriented to  person, place, and time.  Psychiatric: Mood, memory, affect and judgment normal.  Nursing note and vitals reviewed.    Assessment & Plan  1. Acute non-recurrent maxillary sinusitis Start on antibiotic therapy for sinusitis unresponsive to OTC medications - azithromycin (ZITHROMAX) 250 MG tablet; 2 tabs po day 1,then 1 tab po q day x 4 days  Dispense: 6 each; Refill: 0  2. Hypertension due to endocrine disorder Patient had left adrenalectomy, BP stable on present antihypertensive therapy - olmesartan-hydrochlorothiazide (BENICAR HCT) 40-12.5 MG tablet; Take 1 tablet by mouth at bedtime.  Dispense: 90 tablet; Refill: 1   Elexis Pollak [facility] 01/15/2017 11:34 AM

## 2017-01-31 ENCOUNTER — Other Ambulatory Visit: Payer: Self-pay | Admitting: Family Medicine

## 2017-01-31 DIAGNOSIS — I10 Essential (primary) hypertension: Secondary | ICD-10-CM

## 2017-02-03 ENCOUNTER — Other Ambulatory Visit: Payer: Self-pay | Admitting: Family Medicine

## 2017-02-03 DIAGNOSIS — I10 Essential (primary) hypertension: Secondary | ICD-10-CM

## 2017-02-05 ENCOUNTER — Telehealth: Payer: Self-pay | Admitting: Family Medicine

## 2017-02-05 NOTE — Telephone Encounter (Signed)
You prescribed olmesartan and sent it to the pharmacy on 01-15-17 however the pharmacy told patient that they did not have it on file. Pt has not taken medication in almost 5 days. Asking that you please resend to [facility]

## 2017-02-05 NOTE — Telephone Encounter (Signed)
Patient is being followed by endocrinology and apparently her medication was changed from olmesartan-hydrochlorothiazide to plain Olmesartan, records from the endocrinology are not available. I have recommended that her endocrinology should continue prescribing the medication and she should have them forward her records to [place] for review and we can prescribe subsequent refills. Patient verbalized understanding.

## 2017-03-20 ENCOUNTER — Encounter: Payer: Self-pay | Admitting: Family Medicine

## 2017-03-20 ENCOUNTER — Ambulatory Visit (INDEPENDENT_AMBULATORY_CARE_PROVIDER_SITE_OTHER): Payer: Managed Care, Other (non HMO) | Admitting: Family Medicine

## 2017-03-20 ENCOUNTER — Ambulatory Visit: Payer: Managed Care, Other (non HMO) | Admitting: Family Medicine

## 2017-03-20 DIAGNOSIS — K625 Hemorrhage of anus and rectum: Secondary | ICD-10-CM

## 2017-03-20 LAB — CBC WITH DIFFERENTIAL/PLATELET
BASOS ABS: 78 {cells}/uL (ref 0–200)
Basophils Relative: 1 %
EOS ABS: 624 {cells}/uL — AB (ref 15–500)
EOS PCT: 8 %
HCT: 39.6 % (ref 35.0–45.0)
HEMOGLOBIN: 13 g/dL (ref 11.7–15.5)
LYMPHS ABS: 2028 {cells}/uL (ref 850–3900)
Lymphocytes Relative: 26 %
MCH: 26.1 pg — AB (ref 27.0–33.0)
MCHC: 32.8 g/dL (ref 32.0–36.0)
MCV: 79.4 fL — AB (ref 80.0–100.0)
MPV: 10 fL (ref 7.5–12.5)
Monocytes Absolute: 468 cells/uL (ref 200–950)
Monocytes Relative: 6 %
NEUTROS PCT: 59 %
Neutro Abs: 4602 cells/uL (ref 1500–7800)
Platelets: 308 10*3/uL (ref 140–400)
RBC: 4.99 MIL/uL (ref 3.80–5.10)
RDW: 15.6 % — ABNORMAL HIGH (ref 11.0–15.0)
WBC: 7.8 10*3/uL (ref 3.8–10.8)

## 2017-03-20 NOTE — Progress Notes (Signed)
Name: Amanda Fields   MRN: [id number]    DOB: 12-07-1976   Date:03/20/2017       Progress Note  Subjective  Chief Complaint  Chief Complaint  Patient presents with  . Rectal Bleeding    bloody stools x3 weeks    Rectal Bleeding   The current episode started more than 2 weeks ago (3 weeks ago). The onset was sudden. The problem occurs frequently. The problem has been unchanged. The patient is experiencing no pain. The stool is described as soft and mixed with blood. Pertinent negatives include no fever, no abdominal pain, no diarrhea, no hematemesis, no hemorrhoids (has history of hemorrhoids since vaginal delivery 12 years ago), no nausea, no rectal pain, no vomiting, no hematuria and no coughing.    Past Medical History:  Diagnosis Date  . Adrenal mass, left ([place])   . Anemia    none since Hysterectomy done-related to heavy menses  . Asthma    Related to allergies"wheezes with pollen exposure" no asthma attacks  . Breast lipoma 09/06/2011   resolved- no surgery-tx. medically.  . Cough due to ACE inhibitor   . Dizziness   . Edema leg   . Elevated troponin I level 12/31/2015  . Endometriosis   . Hypertension    a. echo 03/2012 EF 60-65%, moderate LVH, nl LV diastolic fxn, nl PASP; b. echo 12/2015: EF 55-60%  . Hypertensive urgency 12/31/2015  . Migraine headache    migrianes-less frequent now  . Obesity   . Palpitation     Past Surgical History:  Procedure Laterality Date  . IR GENERIC HISTORICAL  07/07/2016   IR VENOGRAM RENAL UNI LEFT 07/07/2016 Aletta Edouard, MD [facility]  . IR GENERIC HISTORICAL  07/07/2016   IR VENOUS SAMPLING 07/07/2016 Aletta Edouard, MD [facility]  . IR GENERIC HISTORICAL  07/07/2016   IR VENOGRAM ADRENAL BI 07/07/2016 Aletta Edouard, MD [facility]  . IR GENERIC HISTORICAL  07/07/2016   IR US GUIDE VASC ACCESS RIGHT 07/07/2016 Aletta Edouard, MD [facility]  . IR GENERIC HISTORICAL  07/07/2016   IR ANGIOGRAM SELECTIVE EACH ADDITIONAL VESSEL  07/07/2016 Aletta Edouard, MD [facility]  . IR GENERIC HISTORICAL  07/07/2016   IR VENOUS SAMPLING 07/07/2016 Aletta Edouard, MD [facility]  . IR GENERIC HISTORICAL  07/07/2016   IR US GUIDE VASC ACCESS RIGHT 07/07/2016 Aletta Edouard, MD [facility]  . LAPAROSCOPIC SUPRACERVICAL HYSTERECTOMY  09/27/2010   For endometriosis. Ovaries anf tubes are in place.  Marland Kitchen NASAL SEPTUM SURGERY    . ROBOTIC ADRENALECTOMY Left 10/27/2016   Procedure: XI ROBOTIC LEFT ADRENALECTOMY;  Surgeon: Michael Boston, MD;  Location: [facility];  Service: General;  Laterality: Left;  . TONSILLECTOMY    . TUBAL LIGATION  2006  . WISDOM TOOTH EXTRACTION      Family History  Problem Relation Age of Onset  . COPD Mother   . Hypotension Mother   . Colon cancer Father   . Emphysema Father   . Cancer Father     colon  . Hyperlipidemia Sister   . Heart failure Paternal Uncle   . Heart attack Cousin 32  . Heart disease Cousin     heart attack  . Adrenal disorder Neg Hx     Social History   Social History  . Marital status: Single    Spouse name: N/A  . Number of children: 2  . Years of education: N/A   Occupational History  . call center  Social History Main Topics  . Smoking status: Never Smoker  . Smokeless tobacco: Never Used  . Alcohol use No     Comment: social  . Drug use: No  . Sexual activity: Yes    Partners: Male    Birth control/ protection: Surgical   Other Topics Concern  . Not on file   Social History Narrative  . No narrative on file     Current Outpatient Prescriptions:  .  aspirin EC 81 MG EC tablet, Take 1 tablet (81 mg total) by mouth daily., Disp: 30 tablet, Rfl: 0 .  calcium-vitamin D 250-100 MG-UNIT tablet, Take 1 tablet by mouth 2 (two) times daily., Disp: 60 tablet, Rfl: 3 .  escitalopram (LEXAPRO) 20 MG tablet, Take 1 tablet (20 mg total) by mouth daily., Disp: 31 tablet, Rfl: 4 .  levocetirizine (XYZAL) 5 MG tablet, Take 5 mg by mouth daily as needed for allergies.  , Disp: , Rfl:  .  metoprolol succinate (TOPROL-XL) 50 MG 24 hr tablet, Take 1 tablet (50 mg total) by mouth daily., Disp: 90 tablet, Rfl: 3 .  multivitamin-iron-minerals-folic acid (CENTRUM) chewable tablet, Chew 1 tablet by mouth daily.  , Disp: , Rfl:  .  olmesartan-hydrochlorothiazide (BENICAR HCT) 40-12.5 MG tablet, Take 1 tablet by mouth at bedtime., Disp: 90 tablet, Rfl: 1 .  spironolactone (ALDACTONE) 50 MG tablet, Take 1 tablet (50 mg total) by mouth 2 (two) times daily., Disp: 60 tablet, Rfl: 11  Allergies  Allergen Reactions  . Ace Inhibitors Cough  . Latex Itching    gloves     Review of Systems  Constitutional: Negative for fever.  Respiratory: Negative for cough.   Gastrointestinal: Positive for hematochezia. Negative for abdominal pain, diarrhea, hematemesis, hemorrhoids (has history of hemorrhoids since vaginal delivery 12 years ago), nausea, rectal pain and vomiting.  Genitourinary: Negative for hematuria.    Objective  Vitals:   03/20/17 1047  BP: 128/77  Pulse: 77  Resp: 16  Temp: 98.1 F (36.7 C)  TempSrc: Oral  SpO2: 98%  Weight: 221 lb 1.6 oz (100.3 kg)  Height: 5' 6"  (1.676 m)    Physical Exam  Constitutional: She is oriented to person, place, and time and well-developed, well-nourished, and in no distress.  Genitourinary: Rectal exam shows mass. Rectal exam shows no external hemorrhoid, no fissure and no tenderness.  Genitourinary Comments: Non tender mass just outside the rectum, has an appearance of a skin tag/old external hemorrhoid  Neurological: She is alert and oriented to person, place, and time.  Psychiatric: Mood, memory, affect and judgment normal.  Nursing note and vitals reviewed.     Assessment & Plan  1. Bright red rectal bleeding Hemoccult was negative, with history of blood in stools, we'll refer to gastroenterology for consideration of colonoscopy, obtain CBC to evaluate for blood loss - CBC with Differential/Platelet - POC  Hemoccult Bld/Stl (1-Cd Office Dx) - Ambulatory referral to Gastroenterology   Okey Dupre [facility] 03/20/2017 10:56 AM

## 2017-03-21 LAB — POC HEMOCCULT BLD/STL (OFFICE/1-CARD/DIAGNOSTIC): FECAL OCCULT BLD: NEGATIVE

## 2017-04-02 ENCOUNTER — Encounter: Payer: Self-pay | Admitting: Gastroenterology

## 2017-04-02 ENCOUNTER — Other Ambulatory Visit: Payer: Self-pay

## 2017-04-02 ENCOUNTER — Ambulatory Visit (INDEPENDENT_AMBULATORY_CARE_PROVIDER_SITE_OTHER): Payer: Managed Care, Other (non HMO) | Admitting: Gastroenterology

## 2017-04-02 VITALS — BP 93/61 | HR 67 | Temp 98.3°F | Ht 66.0 in | Wt 223.0 lb

## 2017-04-02 DIAGNOSIS — K921 Melena: Secondary | ICD-10-CM

## 2017-04-02 DIAGNOSIS — K625 Hemorrhage of anus and rectum: Secondary | ICD-10-CM

## 2017-04-02 NOTE — Progress Notes (Signed)
Gastroenterology Consultation  Referring Provider:     Roselee Nova, MD Primary Care Physician:  Keith Rake, MD Primary Gastroenterologist:  Dr. Allen Norris     Reason for Consultation:     Hematochezia        HPI:   Amanda Fields is a 41 y.o. y/o female referred for consultation & management of Hematochezia by Dr. Keith Rake, MD.  This patient comes in today with a report of rectal bleeding.  The patient states that he has been going on for some time but has not sought medical help.  The patient states the region she is here today is because her mother has given her a hard time about not eating seen by a doctor.  The patient reports that her father had colon cancer at the age of 69 and refused treatment and died from it.  The patient denies any unexplained weight loss.  The patient also denies any change in her bowel habits.  The patient comes in today with a picture of the blood in the toilet with bright red blood and some clots seen in the toilet bowl.  The rectal bleeding has not been associated with any abdominal pain.  Past Medical History:  Diagnosis Date  . Adrenal mass, left ([place])   . Anemia    none since Hysterectomy done-related to heavy menses  . Asthma    Related to allergies"wheezes with pollen exposure" no asthma attacks  . Breast lipoma 09/06/2011   resolved- no surgery-tx. medically.  . Cough due to ACE inhibitor   . Dizziness   . Edema leg   . Elevated troponin I level 12/31/2015  . Endometriosis   . Hypertension    a. echo 03/2012 EF 60-65%, moderate LVH, nl LV diastolic fxn, nl PASP; b. echo 12/2015: EF 55-60%  . Hypertensive urgency 12/31/2015  . Migraine headache    migrianes-less frequent now  . Obesity   . Palpitation     Past Surgical History:  Procedure Laterality Date  . IR GENERIC HISTORICAL  07/07/2016   IR VENOGRAM RENAL UNI LEFT 07/07/2016 Aletta Edouard, MD [facility]  . IR GENERIC HISTORICAL  07/07/2016   IR VENOUS SAMPLING 07/07/2016 Aletta Edouard, MD [facility]  . IR GENERIC HISTORICAL  07/07/2016   IR VENOGRAM ADRENAL BI 07/07/2016 Aletta Edouard, MD [facility]  . IR GENERIC HISTORICAL  07/07/2016   IR US GUIDE VASC ACCESS RIGHT 07/07/2016 Aletta Edouard, MD [facility]  . IR GENERIC HISTORICAL  07/07/2016   IR ANGIOGRAM SELECTIVE EACH ADDITIONAL VESSEL 07/07/2016 Aletta Edouard, MD [facility]  . IR GENERIC HISTORICAL  07/07/2016   IR VENOUS SAMPLING 07/07/2016 Aletta Edouard, MD [facility]  . IR GENERIC HISTORICAL  07/07/2016   IR US GUIDE VASC ACCESS RIGHT 07/07/2016 Aletta Edouard, MD [facility]  . LAPAROSCOPIC SUPRACERVICAL HYSTERECTOMY  09/27/2010   For endometriosis. Ovaries anf tubes are in place.  Marland Kitchen NASAL SEPTUM SURGERY    . ROBOTIC ADRENALECTOMY Left 10/27/2016   Procedure: XI ROBOTIC LEFT ADRENALECTOMY;  Surgeon: Michael Boston, MD;  Location: [facility];  Service: General;  Laterality: Left;  . TONSILLECTOMY    . TUBAL LIGATION  2006  . WISDOM TOOTH EXTRACTION      Prior to Admission medications   Medication Sig Start Date End Date Taking? Authorizing Provider  aspirin EC 81 MG EC tablet Take 1 tablet (81 mg total) by mouth daily. 01/01/16  Yes Fritzi Mandes, MD  calcium-vitamin D 250-100  MG-UNIT tablet Take 1 tablet by mouth 2 (two) times daily. 10/13/16  Yes Peggy Constant, MD  ELIDEL 1 % cream 1 APPLICATION APPLY ON THE SKIN DAILY 03/22/17  Yes Historical Provider, MD  escitalopram (LEXAPRO) 20 MG tablet Take 1 tablet (20 mg total) by mouth daily. 10/13/16  Yes Peggy Constant, MD  levocetirizine (XYZAL) 5 MG tablet Take 5 mg by mouth daily as needed for allergies.    Yes Historical Provider, MD  metoprolol succinate (TOPROL-XL) 50 MG 24 hr tablet Take 1 tablet (50 mg total) by mouth daily. 10/26/16  Yes Wende Bushy, MD  multivitamin-iron-minerals-folic acid (CENTRUM) chewable tablet Chew 1 tablet by mouth daily.     Yes Historical Provider, MD  olmesartan-hydrochlorothiazide (BENICAR HCT) 40-12.5 MG tablet  Take 1 tablet by mouth at bedtime. 01/15/17  Yes Roselee Nova, MD  spironolactone (ALDACTONE) 50 MG tablet Take 1 tablet (50 mg total) by mouth 2 (two) times daily. 08/02/16  Yes Renato Shin, MD    Family History  Problem Relation Age of Onset  . COPD Mother   . Hypotension Mother   . Colon cancer Father   . Emphysema Father   . Cancer Father     colon  . Hyperlipidemia Sister   . Heart failure Paternal Uncle   . Heart attack Cousin 32  . Heart disease Cousin     heart attack  . Adrenal disorder Neg Hx      Social History  Substance Use Topics  . Smoking status: Never Smoker  . Smokeless tobacco: Never Used  . Alcohol use No     Comment: social    Allergies as of 04/02/2017 - Review Complete 04/02/2017  Allergen Reaction Noted  . Ace inhibitors Cough 03/22/2012  . Latex Itching 12/22/2015    Review of Systems:    All systems reviewed and negative except where noted in HPI.   Physical Exam:  BP 93/61   Pulse 67   Temp 98.3 F (36.8 C) (Oral)   Ht 5' 6"  (1.676 m)   Wt 223 lb (101.2 kg)   BMI 35.99 kg/m  No LMP recorded. Patient has had a hysterectomy. Psych:  Alert and cooperative. Normal mood and affect. General:   Alert,  Well-developed, well-nourished, pleasant and cooperative in NAD Head:  Normocephalic and atraumatic. Eyes:  Sclera clear, no icterus.   Conjunctiva pink. Ears:  Normal auditory acuity. Nose:  No deformity, discharge, or lesions. Mouth:  No deformity or lesions,oropharynx pink & moist. Neck:  Supple; no masses or thyromegaly. Lungs:  Respirations even and unlabored.  Clear throughout to auscultation.   No wheezes, crackles, or rhonchi. No acute distress. Heart:  Regular rate and rhythm; no murmurs, clicks, rubs, or gallops. Abdomen:  Normal bowel sounds.  No bruits.  Soft, non-tender and non-distended without masses, hepatosplenomegaly or hernias noted.  No guarding or rebound tenderness.  Negative Carnett sign.   Rectal:  Deferred.  Msk:   Symmetrical without gross deformities.  Good, equal movement & strength bilaterally. Pulses:  Normal pulses noted. Extremities:  No clubbing or edema.  No cyanosis. Neurologic:  Alert and oriented x3;  grossly normal neurologically. Skin:  Intact without significant lesions or rashes.  No jaundice. Lymph Nodes:  No significant cervical adenopathy. Psych:  Alert and cooperative. Normal mood and affect.  Imaging Studies: No results found.  Assessment and Plan:   Amanda Fields is a 41 y.o. y/o female who comes in today with a history of rectal bleeding  and a father who had colon cancer the age of 73. The patient has never had a colonoscopy and comes in today at the urging of her mother to have it checked out.  The patient will be set up for colonoscopy to look for source of her rectal bleeding and due to her family history of colon cancer.  The patient has been explained the plan and agrees with it    Lucilla Lame, MD. [facility]   Note: This dictation was prepared with Dragon dictation along with smaller phrase technology. Any transcriptional errors that result from this process are unintentional.

## 2017-04-03 ENCOUNTER — Telehealth: Payer: Self-pay | Admitting: Gastroenterology

## 2017-04-03 ENCOUNTER — Other Ambulatory Visit: Payer: Self-pay | Admitting: Family Medicine

## 2017-04-03 DIAGNOSIS — J302 Other seasonal allergic rhinitis: Secondary | ICD-10-CM

## 2017-04-03 NOTE — Telephone Encounter (Signed)
04/03/17 Per [employer] automated system NO authorization required for Colonoscopy [phone] / K62.5 Rectal bleeding. Confirmation # [id number]

## 2017-04-04 ENCOUNTER — Encounter: Payer: Self-pay | Admitting: *Deleted

## 2017-04-06 ENCOUNTER — Other Ambulatory Visit: Payer: Self-pay

## 2017-04-06 MED ORDER — NA SULFATE-K SULFATE-MG SULF 17.5-3.13-1.6 GM/177ML PO SOLN: 1.0000 | ORAL | 0 refills | Status: DC

## 2017-04-06 NOTE — Discharge Instructions (Signed)
General Anesthesia, Adult, Care After These instructions provide you with information about caring for yourself after your procedure. Your health care provider may also give you more specific instructions. Your treatment has been planned according to current medical practices, but problems sometimes occur. Call your health care provider if you have any problems or questions after your procedure. What can I expect after the procedure? After the procedure, it is common to have:  Vomiting.  A sore throat.  Mental slowness. It is common to feel:  Nauseous.  Cold or shivery.  Sleepy.  Tired.  Sore or achy, even in parts of your body where you did not have surgery. Follow these instructions at home: For at least 24 hours after the procedure:   Do not:  Participate in activities where you could fall or become injured.  Drive.  Use heavy machinery.  Drink alcohol.  Take sleeping pills or medicines that cause drowsiness.  Make important decisions or sign legal documents.  Take care of children on your own.  Rest. Eating and drinking   If you vomit, drink water, juice, or soup when you can drink without vomiting.  Drink enough fluid to keep your urine clear or pale yellow.  Make sure you have little or no nausea before eating solid foods.  Follow the diet recommended by your health care provider. General instructions   Have a responsible adult stay with you until you are awake and alert.  Return to your normal activities as told by your health care provider. Ask your health care provider what activities are safe for you.  Take over-the-counter and prescription medicines only as told by your health care provider.  If you smoke, do not smoke without supervision.  Keep all follow-up visits as told by your health care provider. This is important. Contact a health care provider if:  You continue to have nausea or vomiting at home, and medicines are not helpful.  You  cannot drink fluids or start eating again.  You cannot urinate after 8-12 hours.  You develop a skin rash.  You have fever.  You have increasing redness at the site of your procedure. Get help right away if:  You have difficulty breathing.  You have chest pain.  You have unexpected bleeding.  You feel that you are having a life-threatening or urgent problem. This information is not intended to replace advice given to you by your health care provider. Make sure you discuss any questions you have with your health care provider. Document Released: 03/12/2001 Document Revised: 05/08/2016 Document Reviewed: 11/18/2015 Elsevier Interactive Patient Education  2017 [employer].

## 2017-04-09 ENCOUNTER — Ambulatory Visit: Payer: Managed Care, Other (non HMO) | Admitting: Anesthesiology

## 2017-04-09 ENCOUNTER — Encounter
Admission: RE | Disposition: A | Discharge status: Home / Self Care | Payer: Self-pay | Source: Ambulatory Visit | Attending: Gastroenterology

## 2017-04-09 ENCOUNTER — Ambulatory Visit
Admission: RE | Admit: 2017-04-09 | Discharge: 2017-04-09 | Disposition: A | Discharge status: Home / Self Care | Payer: Managed Care, Other (non HMO) | Source: Ambulatory Visit | Attending: Gastroenterology | Admitting: Gastroenterology

## 2017-04-09 DIAGNOSIS — K921 Melena: Secondary | ICD-10-CM | POA: Diagnosis not present

## 2017-04-09 DIAGNOSIS — Z825 Family history of asthma and other chronic lower respiratory diseases: Secondary | ICD-10-CM | POA: Insufficient documentation

## 2017-04-09 DIAGNOSIS — Z91040 Latex allergy status: Secondary | ICD-10-CM | POA: Diagnosis not present

## 2017-04-09 DIAGNOSIS — I10 Essential (primary) hypertension: Secondary | ICD-10-CM | POA: Insufficient documentation

## 2017-04-09 DIAGNOSIS — K641 Second degree hemorrhoids: Secondary | ICD-10-CM | POA: Insufficient documentation

## 2017-04-09 DIAGNOSIS — Z9889 Other specified postprocedural states: Secondary | ICD-10-CM | POA: Diagnosis not present

## 2017-04-09 DIAGNOSIS — E669 Obesity, unspecified: Secondary | ICD-10-CM | POA: Diagnosis not present

## 2017-04-09 DIAGNOSIS — Z800 Family history of malignant neoplasm of digestive organs: Secondary | ICD-10-CM | POA: Diagnosis not present

## 2017-04-09 DIAGNOSIS — Z888 Allergy status to other drugs, medicaments and biological substances status: Secondary | ICD-10-CM | POA: Insufficient documentation

## 2017-04-09 DIAGNOSIS — Z8349 Family history of other endocrine, nutritional and metabolic diseases: Secondary | ICD-10-CM | POA: Insufficient documentation

## 2017-04-09 DIAGNOSIS — J45909 Unspecified asthma, uncomplicated: Secondary | ICD-10-CM | POA: Insufficient documentation

## 2017-04-09 DIAGNOSIS — Z6834 Body mass index (BMI) 34.0-34.9, adult: Secondary | ICD-10-CM | POA: Diagnosis not present

## 2017-04-09 DIAGNOSIS — Z9851 Tubal ligation status: Secondary | ICD-10-CM | POA: Diagnosis not present

## 2017-04-09 DIAGNOSIS — Z79899 Other long term (current) drug therapy: Secondary | ICD-10-CM | POA: Insufficient documentation

## 2017-04-09 HISTORY — PX: COLONOSCOPY WITH PROPOFOL: SHX5780

## 2017-04-09 SURGERY — COLONOSCOPY WITH PROPOFOL
Anesthesia: Monitor Anesthesia Care | Wound class: Contaminated

## 2017-04-09 MED ORDER — STERILE WATER FOR IRRIGATION IR SOLN
Status: DC | PRN
  Administered 2017-04-09: 10:00:00

## 2017-04-09 MED ORDER — PROPOFOL 10 MG/ML IV BOLUS
INTRAVENOUS | Status: DC | PRN
  Administered 2017-04-09: 100 mg via INTRAVENOUS
  Administered 2017-04-09: 20 mg via INTRAVENOUS
  Administered 2017-04-09: 50 mg via INTRAVENOUS
  Administered 2017-04-09: 20 mg via INTRAVENOUS
  Administered 2017-04-09: 50 mg via INTRAVENOUS

## 2017-04-09 MED ORDER — LIDOCAINE HCL (CARDIAC) 20 MG/ML IV SOLN
INTRAVENOUS | Status: DC | PRN
  Administered 2017-04-09: 50 mg via INTRAVENOUS

## 2017-04-09 MED ORDER — LACTATED RINGERS IV SOLN
10.0000 mL/h | INTRAVENOUS | Status: DC
Start: 2017-04-09 — End: 2017-04-09
  Administered 2017-04-09: 10 mL/h via INTRAVENOUS

## 2017-04-09 SURGICAL SUPPLY — 23 items

## 2017-04-09 NOTE — Anesthesia Preprocedure Evaluation (Signed)
Anesthesia Evaluation  Patient identified by MRN, date of birth, ID band Patient awake    Reviewed: Allergy & Precautions  Airway Mallampati: I  TM Distance: >3 FB     Dental  (+) Teeth Intact   Pulmonary asthma ,    breath sounds clear to auscultation       Cardiovascular hypertension,  Rhythm:regular     Neuro/Psych  Headaches,    GI/Hepatic Rectal bleeding   Endo/Other  s/p adrenalectomy   Renal/GU      Musculoskeletal   Abdominal   Peds  Hematology  (+) anemia ,   Anesthesia Other Findings   Reproductive/Obstetrics                            Anesthesia Physical Anesthesia Plan  ASA: II  Anesthesia Plan: MAC   Post-op Pain Management:    Induction:   Airway Management Planned:   Additional Equipment:   Intra-op Plan:   Post-operative Plan:   Informed Consent: I have reviewed the patients History and Physical, chart, labs and discussed the procedure including the risks, benefits and alternatives for the proposed anesthesia with the patient or authorized representative who has indicated his/her understanding and acceptance.     Plan Discussed with: CRNA  Anesthesia Plan Comments:         Anesthesia Quick Evaluation

## 2017-04-09 NOTE — Op Note (Signed)
[facility] Gastroenterology Patient Name: Amanda Fields Procedure Date: 04/09/2017 10:03 AM MRN: [id number] Account #: [id number] Date of Birth: 1976/11/13 Admit Type: Outpatient Age: 41 Room: [facility] OR ROOM 01 Gender: Female Note Status: Finalized Procedure:            Colonoscopy Indications:          Family history of colon cancer in a first-degree                        relative, Hematochezia Providers:            Lucilla Lame MD, MD Referring MD:         Otila Back. Manuella Ghazi (Referring MD) Medicines:            Propofol per Anesthesia Complications:        No immediate complications. Procedure:            Pre-Anesthesia Assessment:                       - Prior to the procedure, a History and Physical was                        performed, and patient medications and allergies were                        reviewed. The patient's tolerance of previous                        anesthesia was also reviewed. The risks and benefits of                        the procedure and the sedation options and risks were                        discussed with the patient. All questions were                        answered, and informed consent was obtained. Prior                        Anticoagulants: The patient has taken no previous                        anticoagulant or antiplatelet agents. ASA Grade                        Assessment: II - A patient with mild systemic disease.                        After reviewing the risks and benefits, the patient was                        deemed in satisfactory condition to undergo the                        procedure.                       After obtaining informed consent, the colonoscope was  passed under direct vision. Throughout the procedure,                        the patient's blood pressure, pulse, and oxygen                        saturations were monitored continuously. The [place] [id number]) was introduced through the                        anus and advanced to the the cecum, identified by                        appendiceal orifice and ileocecal valve. The                        colonoscopy was performed without difficulty. The                        patient tolerated the procedure well. The quality of                        the bowel preparation was fair. Findings:      The perianal and digital rectal examinations were normal.      Non-bleeding internal hemorrhoids were found during retroflexion. The       hemorrhoids were Grade II (internal hemorrhoids that prolapse but reduce       spontaneously). Impression:           - Preparation of the colon was fair.                       - Non-bleeding internal hemorrhoids.                       - No specimens collected. Recommendation:       - Discharge patient to home.                       - Resume previous diet.                       - High fiber diet. Procedure Code(s):    --- Professional ---                       [phone], Colonoscopy, flexible; diagnostic, including                        collection of specimen(s) by brushing or washing, when                        performed (separate procedure) Diagnosis Code(s):    --- Professional ---                       Z80.0, Family history of malignant neoplasm of                        digestive organs  K92.1, Melena (includes Hematochezia) CPT copyright 2016 American Medical Association. All rights reserved. The codes documented in this report are preliminary and upon coder review may  be revised to meet current compliance requirements. Lucilla Lame MD, MD 04/09/2017 10:24:54 AM This report has been signed electronically. Number of Addenda: 0 Note Initiated On: 04/09/2017 10:03 AM Scope Withdrawal Time: 0 hours 6 minutes 4 seconds  Total Procedure Duration: 0 hours 10 minutes 14 seconds       [facility]

## 2017-04-09 NOTE — H&P (Signed)
Lucilla Lame, MD [facility] [address], [place], [place] [address] Phone:[phone] Fax : [phone]  Primary Care Physician:  Keith Rake, MD Primary Gastroenterologist:  Dr. Allen Norris  Pre-Procedure History & Physical: HPI:  CHRISTINA GINTZ is a 41 y.o. female is here for an colonoscopy.   Past Medical History:  Diagnosis Date  . Adrenal mass, left ([place])   . Anemia    none since Hysterectomy done-related to heavy menses  . Asthma    Related to allergies"wheezes with pollen exposure" no asthma attacks  . Breast lipoma 09/06/2011   resolved- no surgery-tx. medically.  . Cough due to ACE inhibitor   . Dizziness   . Edema leg   . Elevated troponin I level 12/31/2015  . Endometriosis   . Hypertension    a. echo 03/2012 EF 60-65%, moderate LVH, nl LV diastolic fxn, nl PASP; b. echo 12/2015: EF 55-60%  . Hypertensive urgency 12/31/2015  . Migraine headache    migrianes-less frequent now  . Obesity   . Palpitation     Past Surgical History:  Procedure Laterality Date  . IR GENERIC HISTORICAL  07/07/2016   IR VENOGRAM RENAL UNI LEFT 07/07/2016 Aletta Edouard, MD [facility]  . IR GENERIC HISTORICAL  07/07/2016   IR VENOUS SAMPLING 07/07/2016 Aletta Edouard, MD [facility]  . IR GENERIC HISTORICAL  07/07/2016   IR VENOGRAM ADRENAL BI 07/07/2016 Aletta Edouard, MD [facility]  . IR GENERIC HISTORICAL  07/07/2016   IR US GUIDE VASC ACCESS RIGHT 07/07/2016 Aletta Edouard, MD [facility]  . IR GENERIC HISTORICAL  07/07/2016   IR ANGIOGRAM SELECTIVE EACH ADDITIONAL VESSEL 07/07/2016 Aletta Edouard, MD [facility]  . IR GENERIC HISTORICAL  07/07/2016   IR VENOUS SAMPLING 07/07/2016 Aletta Edouard, MD [facility]  . IR GENERIC HISTORICAL  07/07/2016   IR US GUIDE VASC ACCESS RIGHT 07/07/2016 Aletta Edouard, MD [facility]  . LAPAROSCOPIC SUPRACERVICAL HYSTERECTOMY  09/27/2010   For endometriosis. Ovaries anf tubes are in place.  Marland Kitchen NASAL SEPTUM SURGERY    . ROBOTIC  ADRENALECTOMY Left 10/27/2016   Procedure: XI ROBOTIC LEFT ADRENALECTOMY;  Surgeon: Michael Boston, MD;  Location: [facility];  Service: General;  Laterality: Left;  . TONSILLECTOMY    . TUBAL LIGATION  2006  . WISDOM TOOTH EXTRACTION      Prior to Admission medications   Medication Sig Start Date End Date Taking? Authorizing Provider  calcium-vitamin D 250-100 MG-UNIT tablet Take 1 tablet by mouth 2 (two) times daily. 10/13/16  Yes Peggy Constant, MD  ELIDEL 1 % cream 1 APPLICATION APPLY ON THE SKIN DAILY 03/22/17  Yes Historical Provider, MD  escitalopram (LEXAPRO) 20 MG tablet Take 1 tablet (20 mg total) by mouth daily. 10/13/16  Yes Peggy Constant, MD  levocetirizine (XYZAL) 5 MG tablet Take 5 mg by mouth daily as needed for allergies.    Yes Historical Provider, MD  metoprolol succinate (TOPROL-XL) 50 MG 24 hr tablet Take 1 tablet (50 mg total) by mouth daily. 10/26/16  Yes Wende Bushy, MD  multivitamin-iron-minerals-folic acid (CENTRUM) chewable tablet Chew 1 tablet by mouth daily.     Yes Historical Provider, MD  Na Sulfate-K Sulfate-Mg Sulf (SUPREP BOWEL PREP KIT) 17.5-3.13-1.6 GM/180ML SOLN Take 1 kit by mouth as directed. 04/06/17  Yes Tajee Savant Allen Norris, MD  olmesartan-hydrochlorothiazide (BENICAR HCT) 40-12.5 MG tablet Take 1 tablet by mouth at bedtime. 01/15/17  Yes Roselee Nova, MD  spironolactone (ALDACTONE) 50 MG tablet Take 1 tablet (50  mg total) by mouth 2 (two) times daily. 08/02/16  Yes Renato Shin, MD    Allergies as of 04/02/2017 - Review Complete 04/02/2017  Allergen Reaction Noted  . Ace inhibitors Cough 03/22/2012  . Latex Itching 12/22/2015    Family History  Problem Relation Age of Onset  . COPD Mother   . Hypotension Mother   . Colon cancer Father   . Emphysema Father   . Cancer Father     colon  . Hyperlipidemia Sister   . Heart failure Paternal Uncle   . Heart attack Cousin 32  . Heart disease Cousin     heart attack  . Adrenal disorder Neg Hx     Social  History   Social History  . Marital status: Single    Spouse name: N/A  . Number of children: 2  . Years of education: N/A   Occupational History  . call center    Social History Main Topics  . Smoking status: Never Smoker  . Smokeless tobacco: Never Used  . Alcohol use Yes     Comment: social (1 drink/mo)  . Drug use: No  . Sexual activity: Yes    Partners: Male    Birth control/ protection: Surgical   Other Topics Concern  . Not on file   Social History Narrative  . No narrative on file    Review of Systems: See HPI, otherwise negative ROS  Physical Exam: Ht 5' 6"  (1.676 m)   Wt 223 lb (101.2 kg)   BMI 35.99 kg/m  General:   Alert,  pleasant and cooperative in NAD Head:  Normocephalic and atraumatic. Neck:  Supple; no masses or thyromegaly. Lungs:  Clear throughout to auscultation.    Heart:  Regular rate and rhythm. Abdomen:  Soft, nontender and nondistended. Normal bowel sounds, without guarding, and without rebound.   Neurologic:  Alert and  oriented x4;  grossly normal neurologically.  Impression/Plan: CHALSEY LEETH is here for an colonoscopy to be performed for hematochezia  Risks, benefits, limitations, and alternatives regarding  colonoscopy have been reviewed with the patient.  Questions have been answered.  All parties agreeable.   Lucilla Lame, MD  04/09/2017, 9:08 AM

## 2017-04-09 NOTE — Anesthesia Postprocedure Evaluation (Signed)
Anesthesia Post Note  Patient: Amanda Fields  Procedure(s) Performed: Procedure(s) (LRB): COLONOSCOPY WITH PROPOFOL (N/A)  Patient location during evaluation: PACU Anesthesia Type: MAC Level of consciousness: awake and alert Pain management: pain level controlled Vital Signs Assessment: post-procedure vital signs reviewed and stable Respiratory status: spontaneous breathing, nonlabored ventilation and respiratory function stable Cardiovascular status: stable Postop Assessment: no signs of nausea or vomiting Anesthetic complications: no    Veda Canning

## 2017-04-09 NOTE — Transfer of Care (Signed)
Immediate Anesthesia Transfer of Care Note  Patient: Amanda Fields  Procedure(s) Performed: Procedure(s) with comments: COLONOSCOPY WITH PROPOFOL (N/A) - LATEX sensitivity  Patient Location: PACU  Anesthesia Type: MAC  Level of Consciousness: awake, alert  and patient cooperative  Airway and Oxygen Therapy: Patient Spontanous Breathing and Patient connected to supplemental oxygen  Post-op Assessment: Post-op Vital signs reviewed, Patient's Cardiovascular Status Stable, Respiratory Function Stable, Patent Airway and No signs of Nausea or vomiting  Post-op Vital Signs: Reviewed and stable  Complications: No apparent anesthesia complications

## 2017-04-09 NOTE — Anesthesia Procedure Notes (Signed)
Procedure Name: MAC Performed by: Nivaan Dicenzo Pre-anesthesia Checklist: Patient identified, Emergency Drugs available, Suction available, Timeout performed and Patient being monitored Patient Re-evaluated:Patient Re-evaluated prior to inductionOxygen Delivery Method: Nasal cannula Placement Confirmation: positive ETCO2     

## 2017-04-10 ENCOUNTER — Ambulatory Visit: Payer: Managed Care, Other (non HMO) | Admitting: Family Medicine

## 2017-04-10 ENCOUNTER — Encounter: Payer: Self-pay | Admitting: Gastroenterology

## 2017-04-11 ENCOUNTER — Encounter: Payer: Self-pay | Admitting: Family Medicine

## 2017-04-11 ENCOUNTER — Ambulatory Visit (INDEPENDENT_AMBULATORY_CARE_PROVIDER_SITE_OTHER): Payer: Managed Care, Other (non HMO) | Admitting: Family Medicine

## 2017-04-11 VITALS — BP 101/63 | HR 111 | Temp 98.5°F | Resp 17 | Ht 66.0 in | Wt 214.9 lb

## 2017-04-11 DIAGNOSIS — E669 Obesity, unspecified: Secondary | ICD-10-CM

## 2017-04-11 MED ORDER — NALTREXONE-BUPROPION HCL ER 8-90 MG PO TB12: ORAL_TABLET | ORAL | 0 refills | Status: DC

## 2017-04-11 NOTE — Progress Notes (Signed)
Name: Amanda Fields   MRN: [id number]    DOB: 05-05-1976   Date:04/11/2017       Progress Note  Subjective  Chief Complaint  Chief Complaint  Patient presents with  . Follow-up    3 wk  . Weight Loss    HPI  Pt. Presents to discuss pharmacotherapeutic options for weight loss, she has been trying to lose weight, has cut back on caloric intake, is more physically active, her BMI is 34.69kg/m2, Has never been on any weight loss medications before.  Past Medical History:  Diagnosis Date  . Adrenal mass, left ([place])   . Anemia    none since Hysterectomy done-related to heavy menses  . Asthma    Related to allergies"wheezes with pollen exposure" no asthma attacks  . Breast lipoma 09/06/2011   resolved- no surgery-tx. medically.  . Cough due to ACE inhibitor   . Dizziness   . Edema leg   . Elevated troponin I level 12/31/2015  . Endometriosis   . Hypertension    a. echo 03/2012 EF 60-65%, moderate LVH, nl LV diastolic fxn, nl PASP; b. echo 12/2015: EF 55-60%  . Hypertensive urgency 12/31/2015  . Migraine headache    migrianes-less frequent now  . Obesity   . Palpitation     Past Surgical History:  Procedure Laterality Date  . COLONOSCOPY WITH PROPOFOL N/A 04/09/2017   Procedure: COLONOSCOPY WITH PROPOFOL;  Surgeon: Lucilla Lame, MD;  Location: [facility];  Service: Endoscopy;  Laterality: N/A;  LATEX sensitivity  . IR GENERIC HISTORICAL  07/07/2016   IR VENOGRAM RENAL UNI LEFT 07/07/2016 Aletta Edouard, MD [facility]  . IR GENERIC HISTORICAL  07/07/2016   IR VENOUS SAMPLING 07/07/2016 Aletta Edouard, MD [facility]  . IR GENERIC HISTORICAL  07/07/2016   IR VENOGRAM ADRENAL BI 07/07/2016 Aletta Edouard, MD [facility]  . IR GENERIC HISTORICAL  07/07/2016   IR US GUIDE VASC ACCESS RIGHT 07/07/2016 Aletta Edouard, MD [facility]  . IR GENERIC HISTORICAL  07/07/2016   IR ANGIOGRAM SELECTIVE EACH ADDITIONAL VESSEL 07/07/2016 Aletta Edouard, MD [facility]  . IR GENERIC  HISTORICAL  07/07/2016   IR VENOUS SAMPLING 07/07/2016 Aletta Edouard, MD [facility]  . IR GENERIC HISTORICAL  07/07/2016   IR US GUIDE VASC ACCESS RIGHT 07/07/2016 Aletta Edouard, MD [facility]  . LAPAROSCOPIC SUPRACERVICAL HYSTERECTOMY  09/27/2010   For endometriosis. Ovaries anf tubes are in place.  Marland Kitchen NASAL SEPTUM SURGERY    . ROBOTIC ADRENALECTOMY Left 10/27/2016   Procedure: XI ROBOTIC LEFT ADRENALECTOMY;  Surgeon: Michael Boston, MD;  [facility];  Service: General;  Laterality: Left;  . TONSILLECTOMY    . TUBAL LIGATION  2006  . WISDOM TOOTH EXTRACTION      Family History  Problem Relation Age of Onset  . COPD Mother   . Hypotension Mother   . Colon cancer Father   . Emphysema Father   . Cancer Father     colon  . Hyperlipidemia Sister   . Heart failure Paternal Uncle   . Heart attack Cousin 32  . Heart disease Cousin     heart attack  . Adrenal disorder Neg Hx     Social History   Social History  . Marital status: Single    Spouse name: N/A  . Number of children: 2  . Years of education: N/A   Occupational History  . call center    Social History Main Topics  . Smoking status: Never  Smoker  . Smokeless tobacco: Never Used  . Alcohol use Yes     Comment: social (1 drink/mo)  . Drug use: No  . Sexual activity: Yes    Partners: Male    Birth control/ protection: Surgical   Other Topics Concern  . Not on file   Social History Narrative  . No narrative on file     Current Outpatient Prescriptions:  .  calcium-vitamin D 250-100 MG-UNIT tablet, Take 1 tablet by mouth 2 (two) times daily., Disp: 60 tablet, Rfl: 3 .  ELIDEL 1 % cream, 1 APPLICATION APPLY ON THE SKIN DAILY, Disp: , Rfl: 3 .  escitalopram (LEXAPRO) 20 MG tablet, Take 1 tablet (20 mg total) by mouth daily., Disp: 31 tablet, Rfl: 4 .  levocetirizine (XYZAL) 5 MG tablet, Take 5 mg by mouth daily as needed for allergies. , Disp: , Rfl:  .  metoprolol succinate (TOPROL-XL) 50 MG 24 hr  tablet, Take 1 tablet (50 mg total) by mouth daily., Disp: 90 tablet, Rfl: 3 .  multivitamin-iron-minerals-folic acid (CENTRUM) chewable tablet, Chew 1 tablet by mouth daily.  , Disp: , Rfl:  .  Na Sulfate-K Sulfate-Mg Sulf (SUPREP BOWEL PREP KIT) 17.5-3.13-1.6 GM/180ML SOLN, Take 1 kit by mouth as directed., Disp: 1 Bottle, Rfl: 0 .  olmesartan-hydrochlorothiazide (BENICAR HCT) 40-12.5 MG tablet, Take 1 tablet by mouth at bedtime., Disp: 90 tablet, Rfl: 1 .  spironolactone (ALDACTONE) 50 MG tablet, Take 1 tablet (50 mg total) by mouth 2 (two) times daily., Disp: 60 tablet, Rfl: 11  Allergies  Allergen Reactions  . Ace Inhibitors Cough  . Latex Itching    Gloves, condoms     ROS  Please see history of present illness for complete discussion of ROS  Objective  Vitals:   04/11/17 1409  BP: 101/63  Pulse: (!) 111  Resp: 17  Temp: 98.5 F (36.9 C)  TempSrc: Oral  SpO2: 97%  Weight: 214 lb 14.4 oz (97.5 kg)  Height: 5' 6"  (1.676 m)    Physical Exam  Constitutional: She is oriented to person, place, and time and well-developed, well-nourished, and in no distress.  HENT:  Head: Normocephalic and atraumatic.  Cardiovascular: Normal rate, regular rhythm and normal heart sounds.   No murmur heard. Pulmonary/Chest: Effort normal and breath sounds normal. She has no wheezes.  Musculoskeletal: She exhibits no edema.  Neurological: She is alert and oriented to person, place, and time.  Psychiatric: Mood, memory, affect and judgment normal.  Nursing note and vitals reviewed.    Assessment & Plan  1. Obesity with body mass index (BMI) of 30.0 to 39.9 With no associated comorbidity, BMI over 30, start on contrary advised that she must continue with dietary and lifestyle changes and should expect to see between 10-15% weight loss within next 3 months.  - Naltrexone-Bupropion HCl ER (CONTRAVE) 8-90 MG TB12; Start: 1 tab po qam x1 wk, then 1 tab PO bid x 1wk, then 2 tabs po qam and 1  tab po qpm x 1 wk, Max: 4 tabs/day  Dispense: 360 tablet; Refill: 0   [facility] 04/11/2017 2:14 PM

## 2017-04-26 ENCOUNTER — Other Ambulatory Visit: Payer: Self-pay | Admitting: Obstetrics and Gynecology

## 2017-04-26 DIAGNOSIS — Z780 Asymptomatic menopausal state: Secondary | ICD-10-CM

## 2017-06-05 ENCOUNTER — Encounter: Payer: Self-pay | Admitting: Family Medicine

## 2017-06-05 ENCOUNTER — Ambulatory Visit (INDEPENDENT_AMBULATORY_CARE_PROVIDER_SITE_OTHER): Payer: Managed Care, Other (non HMO) | Admitting: Family Medicine

## 2017-06-05 VITALS — BP 118/68 | HR 85 | Temp 98.3°F | Resp 16 | Ht 66.0 in | Wt 214.1 lb

## 2017-06-05 DIAGNOSIS — J0100 Acute maxillary sinusitis, unspecified: Secondary | ICD-10-CM

## 2017-06-05 DIAGNOSIS — R05 Cough: Secondary | ICD-10-CM | POA: Diagnosis not present

## 2017-06-05 DIAGNOSIS — R059 Cough, unspecified: Secondary | ICD-10-CM

## 2017-06-05 MED ORDER — AZITHROMYCIN 250 MG PO TABS: ORAL_TABLET | ORAL | 0 refills | Status: DC

## 2017-06-05 MED ORDER — BENZONATATE 100 MG PO CAPS: 100.0000 mg | ORAL_CAPSULE | Freq: Two times a day (BID) | ORAL | 0 refills | Status: DC | PRN

## 2017-06-05 MED ORDER — MOMETASONE FUROATE 50 MCG/ACT NA SUSP: 2.0000 | Freq: Every day | NASAL | 1 refills | Status: DC

## 2017-06-05 NOTE — Progress Notes (Signed)
Name: Amanda Fields   MRN: [id number]    DOB: 12-26-1975   Date:06/05/2017       Progress Note  Subjective  Chief Complaint  Chief Complaint  Patient presents with  . Allergies    Cough  This is a new problem. The current episode started 1 to 4 weeks ago (2 weeks ago). The cough is productive of sputum (yellowish colored thick mucus). Associated symptoms include a fever, headaches, nasal congestion and a sore throat (itching in both ears and back of throat.). Pertinent negatives include no ear congestion or ear pain. Treatments tried: Advil , mucinex, delsym, Nyquil     Past Medical History:  Diagnosis Date  . Adrenal mass, left ([place])   . Anemia    none since Hysterectomy done-related to heavy menses  . Asthma    Related to allergies"wheezes with pollen exposure" no asthma attacks  . Breast lipoma 09/06/2011   resolved- no surgery-tx. medically.  . Cough due to ACE inhibitor   . Dizziness   . Edema leg   . Elevated troponin I level 12/31/2015  . Endometriosis   . Hypertension    a. echo 03/2012 EF 60-65%, moderate LVH, nl LV diastolic fxn, nl PASP; b. echo 12/2015: EF 55-60%  . Hypertensive urgency 12/31/2015  . Migraine headache    migrianes-less frequent now  . Obesity   . Palpitation     Past Surgical History:  Procedure Laterality Date  . COLONOSCOPY WITH PROPOFOL N/A 04/09/2017   Procedure: COLONOSCOPY WITH PROPOFOL;  Surgeon: Lucilla Lame, MD;  Location: [facility];  Service: Endoscopy;  Laterality: N/A;  LATEX sensitivity  . IR GENERIC HISTORICAL  07/07/2016   IR VENOGRAM RENAL UNI LEFT 07/07/2016 Aletta Edouard, MD [facility]  . IR GENERIC HISTORICAL  07/07/2016   IR VENOUS SAMPLING 07/07/2016 Aletta Edouard, MD [facility]  . IR GENERIC HISTORICAL  07/07/2016   IR VENOGRAM ADRENAL BI 07/07/2016 Aletta Edouard, MD [facility]  . IR GENERIC HISTORICAL  07/07/2016   IR US GUIDE VASC ACCESS RIGHT 07/07/2016 Aletta Edouard, MD [facility]  . IR GENERIC  HISTORICAL  07/07/2016   IR ANGIOGRAM SELECTIVE EACH ADDITIONAL VESSEL 07/07/2016 Aletta Edouard, MD [facility]  . IR GENERIC HISTORICAL  07/07/2016   IR VENOUS SAMPLING 07/07/2016 Aletta Edouard, MD [facility]  . IR GENERIC HISTORICAL  07/07/2016   IR US GUIDE VASC ACCESS RIGHT 07/07/2016 Aletta Edouard, MD [facility]  . LAPAROSCOPIC SUPRACERVICAL HYSTERECTOMY  09/27/2010   For endometriosis. Ovaries anf tubes are in place.  Marland Kitchen NASAL SEPTUM SURGERY    . ROBOTIC ADRENALECTOMY Left 10/27/2016   Procedure: XI ROBOTIC LEFT ADRENALECTOMY;  Surgeon: Michael Boston, MD;  [facility];  Service: General;  Laterality: Left;  . TONSILLECTOMY    . TUBAL LIGATION  2006  . WISDOM TOOTH EXTRACTION      Family History  Problem Relation Age of Onset  . COPD Mother   . Hypotension Mother   . Colon cancer Father   . Emphysema Father   . Cancer Father        colon  . Hyperlipidemia Sister   . Heart failure Paternal Uncle   . Heart attack Cousin 32  . Heart disease Cousin        heart attack  . Adrenal disorder Neg Hx     Social History   Social History  . Marital status: Single    Spouse name: N/A  . Number of children: 2  .  Years of education: N/A   Occupational History  . call center    Social History Main Topics  . Smoking status: Never Smoker  . Smokeless tobacco: Never Used  . Alcohol use Yes     Comment: social (1 drink/mo)  . Drug use: No  . Sexual activity: Yes    Partners: Male    Birth control/ protection: Surgical   Other Topics Concern  . Not on file   Social History Narrative  . No narrative on file     Current Outpatient Prescriptions:  .  calcium-vitamin D 250-100 MG-UNIT tablet, Take 1 tablet by mouth 2 (two) times daily., Disp: 60 tablet, Rfl: 3 .  ELIDEL 1 % cream, 1 APPLICATION APPLY ON THE SKIN DAILY, Disp: , Rfl: 3 .  escitalopram (LEXAPRO) 20 MG tablet, TAKE 1 TABLET (20 MG TOTAL) BY MOUTH DAILY., Disp: 31 tablet, Rfl: 4 .  levocetirizine  (XYZAL) 5 MG tablet, Take 5 mg by mouth daily as needed for allergies. , Disp: , Rfl:  .  metoprolol succinate (TOPROL-XL) 50 MG 24 hr tablet, Take 1 tablet (50 mg total) by mouth daily., Disp: 90 tablet, Rfl: 3 .  multivitamin-iron-minerals-folic acid (CENTRUM) chewable tablet, Chew 1 tablet by mouth daily.  , Disp: , Rfl:  .  Naltrexone-Bupropion HCl ER (CONTRAVE) 8-90 MG TB12, Start: 1 tab po qam x1 wk, then 1 tab PO bid x 1wk, then 2 tabs po qam and 1 tab po qpm x 1 wk, Max: 4 tabs/day, Disp: 360 tablet, Rfl: 0 .  olmesartan-hydrochlorothiazide (BENICAR HCT) 40-12.5 MG tablet, Take 1 tablet by mouth at bedtime., Disp: 90 tablet, Rfl: 1 .  spironolactone (ALDACTONE) 50 MG tablet, Take 1 tablet (50 mg total) by mouth 2 (two) times daily., Disp: 60 tablet, Rfl: 11  Allergies  Allergen Reactions  . Ace Inhibitors Cough  . Latex Itching    Gloves, condoms     Review of Systems  Constitutional: Positive for fever.  HENT: Positive for sore throat (itching in both ears and back of throat.). Negative for ear pain.   Respiratory: Positive for cough.   Neurological: Positive for headaches.    Objective  Vitals:   06/05/17 1410  BP: 118/68  Pulse: 85  Resp: 16  Temp: 98.3 F (36.8 C)  TempSrc: Oral  SpO2: 98%  Weight: 214 lb 1.6 oz (97.1 kg)  Height: 5' 6"  (1.676 m)    Physical Exam  Constitutional: She is well-developed, well-nourished, and in no distress.  HENT:  Head: Normocephalic and atraumatic.  Right Ear: Tympanic membrane and ear canal normal. No drainage or swelling.  Left Ear: Tympanic membrane and ear canal normal. No drainage or swelling.  Nose: Right sinus exhibits maxillary sinus tenderness. Right sinus exhibits no frontal sinus tenderness. Left sinus exhibits maxillary sinus tenderness. Left sinus exhibits no frontal sinus tenderness.  Mouth/Throat: No posterior oropharyngeal erythema.  Nasal mucosal inflammation turbinate hypertrophy  Neck: Neck supple.   Cardiovascular: Normal rate, regular rhythm, S1 normal, S2 normal and normal heart sounds.   Pulmonary/Chest: Effort normal and breath sounds normal. She has no wheezes. She has no rhonchi.  Psychiatric: Mood, memory, affect and judgment normal.  Nursing note and vitals reviewed.      Assessment & Plan  1. Acute non-recurrent maxillary sinusitis By history and exam, started on mometasone nasal spray and azithromycin - mometasone (NASONEX) 50 MCG/ACT nasal spray; Place 2 sprays into the nose daily.  Dispense: 17 g; Refill: 1 - azithromycin (ZITHROMAX  Z-PAK) 250 MG tablet; 2 tabs po day 1, then 1 tab po q day x 4 days  Dispense: 6 each; Refill: 0  2. Cough  - benzonatate (TESSALON) 100 MG capsule; Take 1 capsule (100 mg total) by mouth 2 (two) times daily as needed for cough.  Dispense: 20 capsule; Refill: 0   Tamm[facility] Pressman [facility] 06/05/2017 2:15 PM

## 2017-07-12 ENCOUNTER — Encounter: Payer: Self-pay | Admitting: Family Medicine

## 2017-07-12 ENCOUNTER — Ambulatory Visit (INDEPENDENT_AMBULATORY_CARE_PROVIDER_SITE_OTHER): Payer: Managed Care, Other (non HMO) | Admitting: Family Medicine

## 2017-07-12 DIAGNOSIS — N951 Menopausal and female climacteric states: Secondary | ICD-10-CM

## 2017-07-12 DIAGNOSIS — I152 Hypertension secondary to endocrine disorders: Secondary | ICD-10-CM | POA: Diagnosis not present

## 2017-07-12 DIAGNOSIS — R002 Palpitations: Secondary | ICD-10-CM | POA: Diagnosis not present

## 2017-07-12 MED ORDER — METOPROLOL SUCCINATE ER 50 MG PO TB24: 50.0000 mg | ORAL_TABLET | Freq: Every day | ORAL | 0 refills | Status: DC

## 2017-07-12 MED ORDER — ESCITALOPRAM OXALATE 20 MG PO TABS
20.0000 mg | ORAL_TABLET | Freq: Every day | ORAL | 4 refills | Status: DC
Start: 2017-07-12 — End: 2017-10-23

## 2017-07-12 MED ORDER — OLMESARTAN MEDOXOMIL-HCTZ 40-12.5 MG PO TABS: 1.0000 | ORAL_TABLET | Freq: Every day | ORAL | 1 refills | Status: DC

## 2017-07-12 NOTE — Progress Notes (Signed)
Name: Amanda Fields   MRN: [id number]    DOB: 1976-04-13   Date:07/12/2017       Progress Note  Subjective  Chief Complaint  Chief Complaint  Patient presents with  . Medication Refill  . Follow-up    Hypertension  This is a chronic problem. The problem is unchanged. The problem is controlled. Associated symptoms include palpitations (controlled on Metoprolol, orginally prescribed by Cardiology.). Pertinent negatives include no blurred vision, chest pain, headaches or orthopnea. Past treatments include beta blockers and angiotensin blockers. There is no history of kidney disease, CAD/MI or CVA.    Hot Flashes: Patient was prescribed Lexapro 20 mg once daily for hot flashes associated with menopause by her gynecologist Dr. Mora Bellman, she takes it every day, seems to be working well, no side effects reported.   Past Medical History:  Diagnosis Date  . Adrenal mass, left ([place])   . Anemia    none since Hysterectomy done-related to heavy menses  . Asthma    Related to allergies"wheezes with pollen exposure" no asthma attacks  . Breast lipoma 09/06/2011   resolved- no surgery-tx. medically.  . Cough due to ACE inhibitor   . Dizziness   . Edema leg   . Elevated troponin I level 12/31/2015  . Endometriosis   . Hypertension    a. echo 03/2012 EF 60-65%, moderate LVH, nl LV diastolic fxn, nl PASP; b. echo 12/2015: EF 55-60%  . Hypertensive urgency 12/31/2015  . Migraine headache    migrianes-less frequent now  . Obesity   . Palpitation     Past Surgical History:  Procedure Laterality Date  . COLONOSCOPY WITH PROPOFOL N/A 04/09/2017   Procedure: COLONOSCOPY WITH PROPOFOL;  Surgeon: Lucilla Lame, MD;  Location: [place];  Service: Endoscopy;  Laterality: N/A;  LATEX sensitivity  . IR GENERIC HISTORICAL  07/07/2016   IR VENOGRAM RENAL UNI LEFT 07/07/2016 Aletta Edouard, MD [facility]  . IR GENERIC HISTORICAL  07/07/2016   IR VENOUS SAMPLING 07/07/2016 Aletta Edouard, MD  [facility]  . IR GENERIC HISTORICAL  07/07/2016   IR VENOGRAM ADRENAL BI 07/07/2016 Aletta Edouard, MD [facility]  . IR GENERIC HISTORICAL  07/07/2016   IR US GUIDE VASC ACCESS RIGHT 07/07/2016 Aletta Edouard, MD [facility]  . IR GENERIC HISTORICAL  07/07/2016   IR ANGIOGRAM SELECTIVE EACH ADDITIONAL VESSEL 07/07/2016 Aletta Edouard, MD [facility]  . IR GENERIC HISTORICAL  07/07/2016   IR VENOUS SAMPLING 07/07/2016 Aletta Edouard, MD [facility]  . IR GENERIC HISTORICAL  07/07/2016   IR US GUIDE VASC ACCESS RIGHT 07/07/2016 Aletta Edouard, MD [facility]  . LAPAROSCOPIC SUPRACERVICAL HYSTERECTOMY  09/27/2010   For endometriosis. Ovaries anf tubes are in place.  Marland Kitchen NASAL SEPTUM SURGERY    . ROBOTIC ADRENALECTOMY Left 10/27/2016   Procedure: XI ROBOTIC LEFT ADRENALECTOMY;  Surgeon: Michael Boston, MD;  Location: [facility];  Service: General;  Laterality: Left;  . TONSILLECTOMY    . TUBAL LIGATION  2006  . WISDOM TOOTH EXTRACTION      Family History  Problem Relation Age of Onset  . COPD Mother   . Hypotension Mother   . Colon cancer Father   . Emphysema Father   . Cancer Father        colon  . Hyperlipidemia Sister   . Heart failure Paternal Uncle   . Heart attack Cousin 32  . Heart disease Cousin        heart attack  . Adrenal disorder  Neg Hx     Social History   Social History  . Marital status: Single    Spouse name: N/A  . Number of children: 2  . Years of education: N/A   Occupational History  . call center    Social History Main Topics  . Smoking status: Never Smoker  . Smokeless tobacco: Never Used  . Alcohol use Yes     Comment: social (1 drink/mo)  . Drug use: No  . Sexual activity: Yes    Partners: Male    Birth control/ protection: Surgical   Other Topics Concern  . Not on file   Social History Narrative  . No narrative on file     Current Outpatient Prescriptions:  .  calcium-vitamin D 250-100 MG-UNIT tablet, Take 1 tablet by mouth 2 (two)  times daily., Disp: 60 tablet, Rfl: 3 .  ELIDEL 1 % cream, 1 APPLICATION APPLY ON THE SKIN DAILY, Disp: , Rfl: 3 .  escitalopram (LEXAPRO) 20 MG tablet, TAKE 1 TABLET (20 MG TOTAL) BY MOUTH DAILY., Disp: 31 tablet, Rfl: 4 .  levocetirizine (XYZAL) 5 MG tablet, Take 5 mg by mouth daily as needed for allergies. , Disp: , Rfl:  .  metoprolol succinate (TOPROL-XL) 50 MG 24 hr tablet, Take 1 tablet (50 mg total) by mouth daily., Disp: 90 tablet, Rfl: 3 .  mometasone (NASONEX) 50 MCG/ACT nasal spray, Place 2 sprays into the nose daily., Disp: 17 g, Rfl: 1 .  multivitamin-iron-minerals-folic acid (CENTRUM) chewable tablet, Chew 1 tablet by mouth daily.  , Disp: , Rfl:  .  Naltrexone-Bupropion HCl ER (CONTRAVE) 8-90 MG TB12, Start: 1 tab po qam x1 wk, then 1 tab PO bid x 1wk, then 2 tabs po qam and 1 tab po qpm x 1 wk, Max: 4 tabs/day, Disp: 360 tablet, Rfl: 0 .  olmesartan-hydrochlorothiazide (BENICAR HCT) 40-12.5 MG tablet, Take 1 tablet by mouth at bedtime., Disp: 90 tablet, Rfl: 1 .  spironolactone (ALDACTONE) 50 MG tablet, Take 1 tablet (50 mg total) by mouth 2 (two) times daily., Disp: 60 tablet, Rfl: 11 .  azithromycin (ZITHROMAX Z-PAK) 250 MG tablet, 2 tabs po day 1, then 1 tab po q day x 4 days (Patient not taking: Reported on 07/12/2017), Disp: 6 each, Rfl: 0 .  benzonatate (TESSALON) 100 MG capsule, Take 1 capsule (100 mg total) by mouth 2 (two) times daily as needed for cough. (Patient not taking: Reported on 07/12/2017), Disp: 20 capsule, Rfl: 0  Allergies  Allergen Reactions  . Ace Inhibitors Cough  . Latex Itching    Gloves, condoms     Review of Systems  Eyes: Negative for blurred vision.  Cardiovascular: Positive for palpitations (controlled on Metoprolol, orginally prescribed by Cardiology.). Negative for chest pain and orthopnea.  Neurological: Negative for headaches.    Objective  Vitals:   07/12/17 1344  BP: 118/74  Pulse: 91  Resp: 16  Temp: 98.1 F (36.7 C)  TempSrc:  Oral  SpO2: 99%  Weight: 213 lb 8 oz (96.8 kg)  Height: 5' 6"  (1.676 m)    Physical Exam  Constitutional: She is oriented to person, place, and time and well-developed, well-nourished, and in no distress.  HENT:  Head: Normocephalic and atraumatic.  Cardiovascular: Normal rate, regular rhythm and normal heart sounds.   No murmur heard. Pulmonary/Chest: Effort normal and breath sounds normal. She has no wheezes.  Abdominal: Soft. Bowel sounds are normal. There is no tenderness.  Musculoskeletal: She exhibits no edema (trace  pitting edema).  Neurological: She is alert and oriented to person, place, and time.  Psychiatric: Memory, affect and judgment normal.  Nursing note and vitals reviewed.      Assessment & Plan  1. Hypertension due to endocrine disorder BP stable on present antihypertensive treatment - olmesartan-hydrochlorothiazide (BENICAR HCT) 40-12.5 MG tablet; Take 1 tablet by mouth at bedtime.  Dispense: 90 tablet; Refill: 1  2. Hot flashes due to menopause Originally prescribed by gynecology, seems to be working well. - escitalopram (LEXAPRO) 20 MG tablet; Take 1 tablet (20 mg total) by mouth daily.  Dispense: 31 tablet; Refill: 4  3. Palpitation  - metoprolol succinate (TOPROL-XL) 50 MG 24 hr tablet; Take 1 tablet (50 mg total) by mouth daily.  Dispense: 90 tablet; Refill: 0   Keshana Klemz Asad [facility] 07/12/2017 1:51 PM

## 2017-08-21 ENCOUNTER — Encounter: Payer: Self-pay | Admitting: Family Medicine

## 2017-08-21 ENCOUNTER — Ambulatory Visit (INDEPENDENT_AMBULATORY_CARE_PROVIDER_SITE_OTHER): Payer: Managed Care, Other (non HMO) | Admitting: Family Medicine

## 2017-08-21 VITALS — BP 124/74 | HR 79 | Temp 98.1°F | Resp 16 | Ht 66.0 in | Wt 218.4 lb

## 2017-08-21 DIAGNOSIS — R3911 Hesitancy of micturition: Secondary | ICD-10-CM

## 2017-08-21 DIAGNOSIS — Z872 Personal history of diseases of the skin and subcutaneous tissue: Secondary | ICD-10-CM | POA: Diagnosis not present

## 2017-08-21 DIAGNOSIS — R41840 Attention and concentration deficit: Secondary | ICD-10-CM | POA: Diagnosis not present

## 2017-08-21 LAB — POCT URINALYSIS DIPSTICK
BILIRUBIN UA: NEGATIVE
Blood, UA: NEGATIVE
Glucose, UA: NEGATIVE
KETONES UA: NEGATIVE
LEUKOCYTES UA: NEGATIVE
NITRITE UA: NEGATIVE
PH UA: 5 (ref 5.0–8.0)
Protein, UA: NEGATIVE
Spec Grav, UA: 1.015 (ref 1.010–1.025)
Urobilinogen, UA: NEGATIVE E.U./dL — AB

## 2017-08-21 NOTE — Progress Notes (Signed)
Name: Amanda Fields   MRN: [id number]    DOB: [birth year]   Date:08/21/2017       Progress Note  Subjective  Chief Complaint  Chief Complaint  Patient presents with  . Cystitis    possible, pt complains of lower abdomen pain while urinating     Urinary Tract Infection   This is a new problem. The current episode started 1 to 4 weeks ago (2 weeks ago). The problem occurs every urination. The problem has been unchanged. There has been no fever. Associated symptoms include flank pain and hesitancy. Pertinent negatives include no chills, discharge, nausea, sweats or vomiting. Treatments tried: has tried a 'green looking pill' which has not helped.    She is also concerned about being 'ADHD", she reports getting easily distracted, feeling 'scatter brain', difficulty paying attention in class or group setting, these symptoms have bene present for a long time but really didn't think much about them until now.  She has never been on stimulant medications.   She is also requesting a referral to Dermatology to treat what she considers 'blotches' on her face. HSe has been seeing a Dermatologist already but wishes to see a dermatologist in [place] whom she hhas heard id good wth 'black skin'.    Past Medical History:  Diagnosis Date  . Adrenal mass, left ([place])   . Anemia    none since Hysterectomy done-related to heavy menses  . Asthma    Related to allergies"wheezes with pollen exposure" no asthma attacks  . Breast lipoma 09/06/2011   resolved- no surgery-tx. medically.  . Cough due to ACE inhibitor   . Dizziness   . Edema leg   . Elevated troponin I level 12/31/2015  . Endometriosis   . Hypertension    a. echo 03/2012 EF 60-65%, moderate LVH, nl LV diastolic fxn, nl PASP; b. echo 12/2015: EF 55-60%  . Hypertensive urgency 12/31/2015  . Migraine headache    migrianes-less frequent now  . Obesity   . Palpitation     Past Surgical History:  Procedure Laterality Date  . COLONOSCOPY WITH  PROPOFOL N/A 04/09/2017   Procedure: COLONOSCOPY WITH PROPOFOL;  Surgeon: Lucilla Lame, MD;  Location: [place];  Service: Endoscopy;  Laterality: N/A;  LATEX sensitivity  . IR GENERIC HISTORICAL  07/07/2016   IR VENOGRAM RENAL UNI LEFT 07/07/2016 Aletta Edouard, MD [facility]  . IR GENERIC HISTORICAL  07/07/2016   IR VENOUS SAMPLING 07/07/2016 Aletta Edouard, MD [facility]  . IR GENERIC HISTORICAL  07/07/2016   IR VENOGRAM ADRENAL BI 07/07/2016 Aletta Edouard, MD [facility]  . IR GENERIC HISTORICAL  07/07/2016   IR US GUIDE VASC ACCESS RIGHT 07/07/2016 Aletta Edouard, MD [facility]  . IR GENERIC HISTORICAL  07/07/2016   IR ANGIOGRAM SELECTIVE EACH ADDITIONAL VESSEL 07/07/2016 Aletta Edouard, MD [facility]  . IR GENERIC HISTORICAL  07/07/2016   IR VENOUS SAMPLING 07/07/2016 Aletta Edouard, MD [facility]  . IR GENERIC HISTORICAL  07/07/2016   IR US GUIDE VASC ACCESS RIGHT 07/07/2016 Aletta Edouard, MD [facility]  . LAPAROSCOPIC SUPRACERVICAL HYSTERECTOMY  09/27/2010   For endometriosis. Ovaries anf tubes are in place.  Marland Kitchen NASAL SEPTUM SURGERY    . ROBOTIC ADRENALECTOMY Left 10/27/2016   Procedure: XI ROBOTIC LEFT ADRENALECTOMY;  Surgeon: Michael Boston, MD;  Location: [facility];  Service: General;  Laterality: Left;  . TONSILLECTOMY    . TUBAL LIGATION  2006  . WISDOM TOOTH EXTRACTION  Family History  Problem Relation Age of Onset  . COPD Mother   . Hypotension Mother   . Colon cancer Father   . Emphysema Father   . Cancer Father        colon  . Hyperlipidemia Sister   . Heart failure Paternal Uncle   . Heart attack Cousin 32  . Heart disease Cousin        heart attack  . Adrenal disorder Neg Hx     Social History   Social History  . Marital status: Single    Spouse name: N/A  . Number of children: 2  . Years of education: N/A   Occupational History  . call center    Social History Main Topics  . Smoking status: Never Smoker  . Smokeless tobacco:  Never Used  . Alcohol use Yes     Comment: social (1 drink/mo)  . Drug use: No  . Sexual activity: Yes    Partners: Male    Birth control/ protection: Surgical   Other Topics Concern  . Not on file   Social History Narrative  . No narrative on file     Current Outpatient Prescriptions:  .  calcium-vitamin D 250-100 MG-UNIT tablet, Take 1 tablet by mouth 2 (two) times daily., Disp: 60 tablet, Rfl: 3 .  ELIDEL 1 % cream, 1 APPLICATION APPLY ON THE SKIN DAILY, Disp: , Rfl: 3 .  escitalopram (LEXAPRO) 20 MG tablet, Take 1 tablet (20 mg total) by mouth daily., Disp: 31 tablet, Rfl: 4 .  levocetirizine (XYZAL) 5 MG tablet, Take 5 mg by mouth daily as needed for allergies. , Disp: , Rfl:  .  metoprolol succinate (TOPROL-XL) 50 MG 24 hr tablet, Take 1 tablet (50 mg total) by mouth daily., Disp: 90 tablet, Rfl: 0 .  mometasone (NASONEX) 50 MCG/ACT nasal spray, Place 2 sprays into the nose daily., Disp: 17 g, Rfl: 1 .  multivitamin-iron-minerals-folic acid (CENTRUM) chewable tablet, Chew 1 tablet by mouth daily.  , Disp: , Rfl:  .  olmesartan-hydrochlorothiazide (BENICAR HCT) 40-12.5 MG tablet, Take 1 tablet by mouth at bedtime., Disp: 90 tablet, Rfl: 1 .  spironolactone (ALDACTONE) 50 MG tablet, Take 1 tablet (50 mg total) by mouth 2 (two) times daily., Disp: 60 tablet, Rfl: 11  Allergies  Allergen Reactions  . Ace Inhibitors Cough  . Latex Itching    Gloves, condoms     Review of Systems  Constitutional: Negative for chills.  Gastrointestinal: Negative for nausea and vomiting.  Genitourinary: Positive for flank pain and hesitancy.      Objective  Vitals:   08/21/17 1602  BP: 124/74  Pulse: 79  Resp: 16  Temp: 98.1 F (36.7 C)  TempSrc: Oral  SpO2: 97%  Weight: 218 lb 6.4 oz (99.1 kg)  Height: 5' 6"  (1.676 m)    Physical Exam  Constitutional: She is oriented to person, place, and time and well-developed, well-nourished, and in no distress.  HENT:  Head:  Normocephalic and atraumatic.  Cardiovascular: Normal rate, regular rhythm and normal heart sounds.   No murmur heard. Pulmonary/Chest: Effort normal and breath sounds normal. She has no wheezes.  Abdominal: Soft. Bowel sounds are normal. There is no tenderness. There is no CVA tenderness.  Musculoskeletal: She exhibits edema.  Neurological: She is alert and oriented to person, place, and time.  Psychiatric: Mood, memory, affect and judgment normal.  Nursing note and vitals reviewed.     Assessment & Plan  1. Urinary hesitancy Point-of-care  testing reveals no evidence of urinary infection, we will obtain urinalysis, microscopic exam and culture by the lab, started on antibiotics if culture dictates - POCT Urinalysis Dipstick - Urinalysis, Routine w reflex microscopic - Urine Culture  2. Inattention  Provided ADHD screening questionnaire to be reviewed in one to 2 weeks  3. History of acne  - Ambulatory referral to Dermatology  [facility] 08/21/2017 4:10 PM

## 2017-08-22 LAB — URINALYSIS, ROUTINE W REFLEX MICROSCOPIC
BILIRUBIN URINE: NEGATIVE
Glucose, UA: NEGATIVE
HGB URINE DIPSTICK: NEGATIVE
KETONES UR: NEGATIVE
Leukocytes, UA: NEGATIVE
NITRITE: NEGATIVE
PH: 6 (ref 5.0–8.0)
Protein, ur: NEGATIVE
SPECIFIC GRAVITY, URINE: 1.026 (ref 1.001–1.035)

## 2017-08-22 LAB — URINE CULTURE

## 2017-08-29 ENCOUNTER — Ambulatory Visit: Payer: Managed Care, Other (non HMO) | Admitting: Family Medicine

## 2017-09-04 ENCOUNTER — Ambulatory Visit: Payer: Managed Care, Other (non HMO) | Admitting: Family Medicine

## 2017-09-16 ENCOUNTER — Other Ambulatory Visit: Payer: Self-pay | Admitting: Endocrinology

## 2017-09-16 NOTE — Telephone Encounter (Signed)
Please refill x 1 Ov is due  

## 2017-09-17 ENCOUNTER — Other Ambulatory Visit: Payer: Managed Care, Other (non HMO)

## 2017-09-17 ENCOUNTER — Other Ambulatory Visit: Payer: Self-pay

## 2017-09-17 MED ORDER — SPIRONOLACTONE 50 MG PO TABS: 50.0000 mg | ORAL_TABLET | Freq: Two times a day (BID) | ORAL | 1 refills | Status: DC

## 2017-09-18 ENCOUNTER — Other Ambulatory Visit: Payer: Self-pay | Admitting: Family Medicine

## 2017-09-18 DIAGNOSIS — J0100 Acute maxillary sinusitis, unspecified: Secondary | ICD-10-CM

## 2017-10-15 ENCOUNTER — Ambulatory Visit: Payer: Managed Care, Other (non HMO) | Admitting: Family Medicine

## 2017-10-23 ENCOUNTER — Ambulatory Visit: Payer: Managed Care, Other (non HMO) | Admitting: Obstetrics & Gynecology

## 2017-10-23 ENCOUNTER — Ambulatory Visit (INDEPENDENT_AMBULATORY_CARE_PROVIDER_SITE_OTHER): Payer: Managed Care, Other (non HMO) | Admitting: Obstetrics & Gynecology

## 2017-10-23 ENCOUNTER — Encounter: Payer: Self-pay | Admitting: Family Medicine

## 2017-10-23 ENCOUNTER — Ambulatory Visit (INDEPENDENT_AMBULATORY_CARE_PROVIDER_SITE_OTHER): Payer: Managed Care, Other (non HMO) | Admitting: Family Medicine

## 2017-10-23 ENCOUNTER — Encounter: Payer: Self-pay | Admitting: Obstetrics & Gynecology

## 2017-10-23 ENCOUNTER — Other Ambulatory Visit (HOSPITAL_COMMUNITY)
Admission: RE | Admit: 2017-10-23 | Discharge: 2017-10-23 | Disposition: A | Discharge status: Home / Self Care | Payer: Managed Care, Other (non HMO) | Source: Ambulatory Visit | Attending: Obstetrics & Gynecology | Admitting: Obstetrics & Gynecology

## 2017-10-23 ENCOUNTER — Other Ambulatory Visit: Payer: Self-pay | Admitting: Obstetrics & Gynecology

## 2017-10-23 VITALS — BP 126/74 | HR 83 | Temp 98.3°F | Resp 14 | Ht 66.0 in | Wt 214.8 lb

## 2017-10-23 VITALS — BP 106/71 | HR 93 | Ht 67.0 in | Wt 216.0 lb

## 2017-10-23 DIAGNOSIS — Z01419 Encounter for gynecological examination (general) (routine) without abnormal findings: Secondary | ICD-10-CM | POA: Diagnosis not present

## 2017-10-23 DIAGNOSIS — I152 Hypertension secondary to endocrine disorders: Secondary | ICD-10-CM | POA: Diagnosis not present

## 2017-10-23 DIAGNOSIS — Z23 Encounter for immunization: Secondary | ICD-10-CM | POA: Diagnosis not present

## 2017-10-23 DIAGNOSIS — Z1231 Encounter for screening mammogram for malignant neoplasm of breast: Secondary | ICD-10-CM

## 2017-10-23 DIAGNOSIS — Z309 Encounter for contraceptive management, unspecified: Secondary | ICD-10-CM

## 2017-10-23 DIAGNOSIS — N951 Menopausal and female climacteric states: Secondary | ICD-10-CM

## 2017-10-23 DIAGNOSIS — F900 Attention-deficit hyperactivity disorder, predominantly inattentive type: Secondary | ICD-10-CM

## 2017-10-23 MED ORDER — OLMESARTAN MEDOXOMIL-HCTZ 40-12.5 MG PO TABS: 1.0000 | ORAL_TABLET | Freq: Every day | ORAL | 1 refills | Status: DC

## 2017-10-23 MED ORDER — ESCITALOPRAM OXALATE 20 MG PO TABS: 20.0000 mg | ORAL_TABLET | Freq: Every day | ORAL | 2 refills | Status: DC

## 2017-10-23 MED ORDER — CLONIDINE HCL 0.1 MG PO TABS: 0.1000 mg | ORAL_TABLET | Freq: Every day | ORAL | 0 refills | Status: DC

## 2017-10-23 NOTE — Progress Notes (Signed)
Name: Amanda Fields   MRN: [id number]    DOB: 14-Nov-1976   Date:10/23/2017       Progress Note  Subjective  Chief Complaint  Chief Complaint  Patient presents with  . Medication Refill    lexapro and metoprolol   . food poision    stomach issues     Hypertension  This is a chronic problem. The problem is unchanged. The problem is controlled. Pertinent negatives include no blurred vision, chest pain, headaches, palpitations, shortness of breath or sweats. Past treatments include angiotensin blockers and diuretics. There is no history of kidney disease, CAD/MI or CVA. Identifiable causes of hypertension include hyperaldosteronism.   Attention Deficit Disorder: Pt. Brings her self report for ADHD symptoms checklist, she has symptoms of inattention, difficulty relaxing, difficulty with focusing, getting easily distracted etc. She reports symptoms have been present for years, she reports significant difficulty with her employment and household work, she has never been on stimulant medications.   She has hot flashes due to surgical menopause, had hysterectomy in 2011, since then, she has hot flashes and night sweats, she takes Lexapro 20 mg daily, which seems to help with her symptoms.   Past Medical History:  Diagnosis Date  . Adrenal mass, left ([place])   . Anemia    none since Hysterectomy done-related to heavy menses  . Asthma    Related to allergies"wheezes with pollen exposure" no asthma attacks  . Breast lipoma 09/06/2011   resolved- no surgery-tx. medically.  . Cough due to ACE inhibitor   . Dizziness   . Edema leg   . Elevated troponin I level 12/31/2015  . Endometriosis   . Hypertension    a. echo 03/2012 EF 60-65%, moderate LVH, nl LV diastolic fxn, nl PASP; b. echo 12/2015: EF 55-60%  . Hypertensive urgency 12/31/2015  . Migraine headache    migrianes-less frequent now  . Obesity   . Palpitation     Past Surgical History:  Procedure Laterality Date  . IR GENERIC  HISTORICAL  07/07/2016   IR VENOGRAM RENAL UNI LEFT 07/07/2016 Aletta Edouard, MD [facility]  . IR GENERIC HISTORICAL  07/07/2016   IR VENOUS SAMPLING 07/07/2016 Aletta Edouard, MD [facility]  . IR GENERIC HISTORICAL  07/07/2016   IR VENOGRAM ADRENAL BI 07/07/2016 Aletta Edouard, MD [facility]  . IR GENERIC HISTORICAL  07/07/2016   IR US GUIDE VASC ACCESS RIGHT 07/07/2016 Aletta Edouard, MD [facility]  . IR GENERIC HISTORICAL  07/07/2016   IR ANGIOGRAM SELECTIVE EACH ADDITIONAL VESSEL 07/07/2016 Aletta Edouard, MD [facility]  . IR GENERIC HISTORICAL  07/07/2016   IR VENOUS SAMPLING 07/07/2016 Aletta Edouard, MD [facility]  . IR GENERIC HISTORICAL  07/07/2016   IR US GUIDE VASC ACCESS RIGHT 07/07/2016 Aletta Edouard, MD [facility]  . LAPAROSCOPIC SUPRACERVICAL HYSTERECTOMY  09/27/2010   For endometriosis. Ovaries anf tubes are in place.  Marland Kitchen NASAL SEPTUM SURGERY    . TONSILLECTOMY    . TUBAL LIGATION  2006  . WISDOM TOOTH EXTRACTION      Family History  Problem Relation Age of Onset  . COPD Mother   . Hypotension Mother   . Colon cancer Father   . Emphysema Father   . Cancer Father        colon  . Hyperlipidemia Sister   . Heart failure Paternal Uncle   . Heart attack Cousin 32  . Heart disease Cousin        heart attack  .  Adrenal disorder Neg Hx     Social History   Socioeconomic History  . Marital status: Single    Spouse name: Not on file  . Number of children: 2  . Years of education: Not on file  . Highest education level: Not on file  Social Needs  . Financial resource strain: Not on file  . Food insecurity - worry: Not on file  . Food insecurity - inability: Not on file  . Transportation needs - medical: Not on file  . Transportation needs - non-medical: Not on file  Occupational History  . Occupation: call center  Tobacco Use  . Smoking status: Never Smoker  . Smokeless tobacco: Never Used  Substance and Sexual Activity  . Alcohol use: Yes     Comment: social (1 drink/mo)  . Drug use: No  . Sexual activity: Yes    Partners: Male    Birth control/protection: Surgical  Other Topics Concern  . Not on file  Social History Narrative  . Not on file     Current Outpatient Medications:  .  calcium-vitamin D 250-100 MG-UNIT tablet, Take 1 tablet by mouth 2 (two) times daily., Disp: 60 tablet, Rfl: 3 .  escitalopram (LEXAPRO) 20 MG tablet, Take 1 tablet (20 mg total) by mouth daily., Disp: 31 tablet, Rfl: 4 .  metoprolol succinate (TOPROL-XL) 50 MG 24 hr tablet, Take 1 tablet (50 mg total) by mouth daily., Disp: 90 tablet, Rfl: 0 .  mometasone (NASONEX) 50 MCG/ACT nasal spray, PLACE 2 SPRAYS INTO THE NOSE DAILY., Disp: 17 g, Rfl: 1 .  multivitamin-iron-minerals-folic acid (CENTRUM) chewable tablet, Chew 1 tablet by mouth daily.  , Disp: , Rfl:  .  olmesartan-hydrochlorothiazide (BENICAR HCT) 40-12.5 MG tablet, Take 1 tablet by mouth at bedtime., Disp: 90 tablet, Rfl: 1 .  spironolactone (ALDACTONE) 50 MG tablet, Take 1 tablet (50 mg total) by mouth 2 (two) times daily., Disp: 60 tablet, Rfl: 1 .  ELIDEL 1 % cream, 1 APPLICATION APPLY ON THE SKIN DAILY, Disp: , Rfl: 3 .  levocetirizine (XYZAL) 5 MG tablet, Take 5 mg by mouth daily as needed for allergies. , Disp: , Rfl:   Allergies  Allergen Reactions  . Ace Inhibitors Cough  . Latex Itching    Gloves, condoms     Review of Systems  Eyes: Negative for blurred vision.  Respiratory: Negative for shortness of breath.   Cardiovascular: Negative for chest pain and palpitations.  Neurological: Negative for headaches.     Objective  Vitals:   10/23/17 0844  BP: 126/74  Pulse: 83  Resp: 14  Temp: 98.3 F (36.8 C)  TempSrc: Oral  SpO2: 98%  Weight: 214 lb 12.8 oz (97.4 kg)  Height: 5' 6"  (1.676 m)    Physical Exam  Constitutional: She is oriented to person, place, and time and well-developed, well-nourished, and in no distress.  HENT:  Head: Normocephalic and  atraumatic.  Cardiovascular: Normal rate, regular rhythm and normal heart sounds.  No murmur heard. Pulmonary/Chest: Effort normal and breath sounds normal. She has no wheezes.  Abdominal: Soft. Bowel sounds are normal. There is no tenderness.  Musculoskeletal: She exhibits no edema.  Neurological: She is alert and oriented to person, place, and time.  Psychiatric: Memory, affect and judgment normal.  Nursing note and vitals reviewed.      Assessment & Plan  1. Hot flashes due to menopause Stable, continue on Lexapro - escitalopram (LEXAPRO) 20 MG tablet; Take 1 tablet (20 mg total) daily  by mouth.  Dispense: 30 tablet; Refill: 2  2. Hypertension due to endocrine disorder BP stable on present antihypertensive treatment - olmesartan-hydrochlorothiazide (BENICAR HCT) 40-12.5 MG tablet; Take 1 tablet at bedtime by mouth.  Dispense: 90 tablet; Refill: 1  3. ADHD (attention deficit hyperactivity disorder), inattentive type Reviewed the ADHD questionnaire and patient's responses, will start on clonidine 0.1 mg daily, avoid stimulants at this time because of history of hypertension induced by hyperaldosteronism and history of palpitations. Follow-up in one month - cloNIDine (CATAPRES) 0.1 MG tablet; Take 1 tablet (0.1 mg total) daily by mouth.  Dispense: 30 tablet; Refill: 0   Gemini Beaumier Asad [facility] 10/23/2017 8:50 AM

## 2017-10-23 NOTE — Progress Notes (Signed)
hpvSubjective:    Amanda Fields is a 41 y.o. S AA P2 (70 and 24 yo kids) female who presents for an annual exam. The patient has no complaints today. The patient is sexually active. GYN screening history: last pap: was normal. The patient wears seatbelts: yes. The patient participates in regular exercise: yes. Has the patient ever been transfused or tattooed?: yes. The patient reports that there is not domestic violence in her life.   Menstrual History: OB History    Gravida Para Term Preterm AB Living   2 2 2     2    SAB TAB Ectopic Multiple Live Births           2      Menarche age: 2 No LMP recorded. Patient has had a hysterectomy.    The following portions of the patient's history were reviewed and updated as appropriate: allergies, current medications, past family history, past medical history, past social history, past surgical history and problem list.  Review of Systems Pertinent items are noted in HPI.   FH- + breast cancer in mat aunt- premenopausal, + mat first cousin, Patient's BRCA was negative No gyn cancer.  + colon cancer in father, dx'd at 22 yo. Patient had her colonoscopy this year. 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)   Objective:    BP 106/71   Pulse 93   Ht 5' 7"  (1.702 m)   Wt 216 lb (98 kg)   BMI 33.83 kg/m   General Appearance:    Alert, cooperative, no distress, appears stated age  Head:    Normocephalic, without obvious abnormality, atraumatic  Eyes:    PERRL, conjunctiva/corneas clear, EOM's intact, fundi    benign, both eyes  Ears:    Normal TM's and external ear canals, both ears  Nose:   Nares normal, septum midline, mucosa normal, no drainage    or sinus tenderness  Throat:   Lips, mucosa, and tongue normal; teeth and gums normal  Neck:   Supple, symmetrical, trachea midline, no adenopathy;    thyroid:  no enlargement/tenderness/nodules; no carotid   bruit or JVD  Back:     Symmetric, no curvature, ROM normal, no CVA  tenderness  Lungs:     Clear to auscultation bilaterally, respirations unlabored  Chest Wall:    No tenderness or deformity   Heart:    Regular rate and rhythm, S1 and S2 normal, no murmur, rub   or gallop  Breast Exam:    No tenderness, masses, or nipple abnormality  Abdomen:     Soft, non-tender, bowel sounds active all four quadrants,    no masses, no organomegaly  Genitalia:    Normal female without lesion, discharge or tenderness, cervix appears normal, bimanual exam reveals no masses or tenderness     Extremities:   Extremities normal, atraumatic, no cyanosis or edema  Pulses:   2+ and symmetric all extremities  Skin:   Skin color, texture, turgor normal, no rashes or lesions  Lymph nodes:   Cervical, supraclavicular, and axillary nodes normal  Neurologic:   CNII-XII intact, normal strength, sensation and reflexes    throughout  .    Assessment:    Healthy female exam.    Plan:     Mammogram.

## 2017-10-25 ENCOUNTER — Encounter: Payer: Self-pay | Admitting: Family Medicine

## 2017-10-25 LAB — CYTOLOGY - PAP
Diagnosis: UNDETERMINED — AB
HPV (WINDOPATH): NOT DETECTED

## 2017-10-26 ENCOUNTER — Encounter: Payer: Self-pay | Admitting: *Deleted

## 2017-10-26 ENCOUNTER — Ambulatory Visit: Payer: Self-pay | Admitting: *Deleted

## 2017-10-26 NOTE — Telephone Encounter (Signed)
Patient is decreasing dosage and frequency of b/p meds due to lightheadedness with full doses daily. She did not want to come in office today but would Monday.  Reason for Disposition . [3] Systolic BP 29-924 AND [2] taking blood pressure medications AND [3] dizzy, lightheaded or weak  Answer Assessment - Initial Assessment Questions 1. BLOOD PRESSURE: "What is the blood pressure?" "Did you take at least two measurements 5 minutes apart?"    99/75 both times today 2. ONSET: "When did you take your blood pressure?"     This morning 99/75 3. HOW: "How did you obtain the blood pressure?" (e.g., visiting nurse, automatic home BP monitor)     Automatic home bp monitor 4. HISTORY: "Do you have a history of low blood pressure?" "What is your blood pressure normally?"     Has happened in the past and would cut Benicar to half dose 5. MEDICATIONS: "Are you taking any medications for blood pressure?" If yes: "Have they been changed recently?"     Recently added clonidine hcl .25m every day for ADHD. Also on other b/p meds. Lt adrenal gland removed last year 6. PULSE RATE: "Do you know what your pulse rate is?"    68 7. OTHER SYMPTOMS: "Have you been sick recently?" "Have you had a recent injury?"     no 8. PREGNANCY: "Is there any chance you are pregnant?" "When was your last menstrual period?"   Does not have periods  Protocols used: LOW BLOOD PRESSURE-A-AH

## 2017-10-29 ENCOUNTER — Ambulatory Visit (INDEPENDENT_AMBULATORY_CARE_PROVIDER_SITE_OTHER): Payer: Managed Care, Other (non HMO) | Admitting: Family Medicine

## 2017-10-29 ENCOUNTER — Other Ambulatory Visit: Payer: Managed Care, Other (non HMO)

## 2017-10-29 ENCOUNTER — Encounter: Payer: Self-pay | Admitting: Family Medicine

## 2017-10-29 VITALS — BP 104/62 | HR 72 | Temp 98.3°F | Resp 14 | Ht 66.0 in | Wt 220.4 lb

## 2017-10-29 DIAGNOSIS — F900 Attention-deficit hyperactivity disorder, predominantly inattentive type: Secondary | ICD-10-CM

## 2017-10-29 DIAGNOSIS — I952 Hypotension due to drugs: Secondary | ICD-10-CM | POA: Diagnosis not present

## 2017-10-29 HISTORY — DX: Attention-deficit hyperactivity disorder, predominantly inattentive type: F90.0

## 2017-10-29 MED ORDER — METHYLPHENIDATE HCL ER (OSM) 18 MG PO TBCR: 18.0000 mg | EXTENDED_RELEASE_TABLET | Freq: Every day | ORAL | 0 refills | Status: DC

## 2017-10-29 NOTE — Progress Notes (Signed)
Name: Amanda Fields   MRN: [id number]    DOB: February 04, 1976   Date:10/29/2017       Progress Note  Subjective  Chief Complaint  Chief Complaint  Patient presents with  . Hypotension    BP was 97/71 last night. Pt states when she work out she feels lightheadness and dizzness; pt denies any blur vision.   . medication mangement    PT states she low Bp started right after the ADHD medication.     HPI  Pt. Presents for evaluation of low blood pressure and dizziness since she started taking Clonidine 0.1 mg last week, her BP has been ranging in low 542H systolic over 06-23J diastolic, yesterday it was 96/71 and she was very light-headed. She denies any congestion, sinus pain etc. She has experienced headaches more frequently since she started taking Clonidine.     Past Medical History:  Diagnosis Date  . Adrenal mass, left ([place])   . Anemia    none since Hysterectomy done-related to heavy menses  . Asthma    Related to allergies"wheezes with pollen exposure" no asthma attacks  . Breast lipoma 09/06/2011   resolved- no surgery-tx. medically.  . Cough due to ACE inhibitor   . Dizziness   . Edema leg   . Elevated troponin I level 12/31/2015  . Endometriosis   . Hypertension    a. echo 03/2012 EF 60-65%, moderate LVH, nl LV diastolic fxn, nl PASP; b. echo 12/2015: EF 55-60%  . Hypertensive urgency 12/31/2015  . Migraine headache    migrianes-less frequent now  . Obesity   . Palpitation     Past Surgical History:  Procedure Laterality Date  . IR GENERIC HISTORICAL  07/07/2016   IR VENOGRAM RENAL UNI LEFT 07/07/2016 Aletta Edouard, MD [facility]  . IR GENERIC HISTORICAL  07/07/2016   IR VENOUS SAMPLING 07/07/2016 Aletta Edouard, MD [facility]  . IR GENERIC HISTORICAL  07/07/2016   IR VENOGRAM ADRENAL BI 07/07/2016 Aletta Edouard, MD [facility]  . IR GENERIC HISTORICAL  07/07/2016   IR US GUIDE VASC ACCESS RIGHT 07/07/2016 Aletta Edouard, MD [facility]  . IR GENERIC HISTORICAL   07/07/2016   IR ANGIOGRAM SELECTIVE EACH ADDITIONAL VESSEL 07/07/2016 Aletta Edouard, MD [facility]  . IR GENERIC HISTORICAL  07/07/2016   IR VENOUS SAMPLING 07/07/2016 Aletta Edouard, MD [facility]  . IR GENERIC HISTORICAL  07/07/2016   IR US GUIDE VASC ACCESS RIGHT 07/07/2016 Aletta Edouard, MD [facility]  . LAPAROSCOPIC SUPRACERVICAL HYSTERECTOMY  09/27/2010   For endometriosis. Ovaries anf tubes are in place.  Marland Kitchen NASAL SEPTUM SURGERY    . TONSILLECTOMY    . TUBAL LIGATION  2006  . WISDOM TOOTH EXTRACTION      Family History  Problem Relation Age of Onset  . COPD Mother   . Hypotension Mother   . Colon cancer Father   . Emphysema Father   . Cancer Father        colon  . Hyperlipidemia Sister   . Heart failure Paternal Uncle   . Heart attack Cousin 32  . Heart disease Cousin        heart attack  . Adrenal disorder Neg Hx     Social History   Socioeconomic History  . Marital status: Single    Spouse name: Not on file  . Number of children: 2  . Years of education: Not on file  . Highest education level: Not on file  Social Needs  .  Financial resource strain: Not on file  . Food insecurity - worry: Not on file  . Food insecurity - inability: Not on file  . Transportation needs - medical: Not on file  . Transportation needs - non-medical: Not on file  Occupational History  . Occupation: call center  Tobacco Use  . Smoking status: Never Smoker  . Smokeless tobacco: Never Used  Substance and Sexual Activity  . Alcohol use: Yes    Comment: social (1 drink/mo)  . Drug use: No  . Sexual activity: Yes    Partners: Male    Birth control/protection: Surgical  Other Topics Concern  . Not on file  Social History Narrative  . Not on file     Current Outpatient Medications:  .  calcium-vitamin D 250-100 MG-UNIT tablet, Take 1 tablet by mouth 2 (two) times daily., Disp: 60 tablet, Rfl: 3 .  cloNIDine (CATAPRES) 0.1 MG tablet, Take 1 tablet (0.1 mg total) daily by  mouth., Disp: 30 tablet, Rfl: 0 .  ELIDEL 1 % cream, 1 APPLICATION APPLY ON THE SKIN DAILY, Disp: , Rfl: 3 .  escitalopram (LEXAPRO) 20 MG tablet, Take 1 tablet (20 mg total) daily by mouth., Disp: 30 tablet, Rfl: 2 .  levocetirizine (XYZAL) 5 MG tablet, Take 5 mg by mouth daily as needed for allergies. , Disp: , Rfl:  .  metoprolol succinate (TOPROL-XL) 50 MG 24 hr tablet, Take 1 tablet (50 mg total) by mouth daily., Disp: 90 tablet, Rfl: 0 .  mometasone (NASONEX) 50 MCG/ACT nasal spray, PLACE 2 SPRAYS INTO THE NOSE DAILY., Disp: 17 g, Rfl: 1 .  multivitamin-iron-minerals-folic acid (CENTRUM) chewable tablet, Chew 1 tablet by mouth daily.  , Disp: , Rfl:  .  olmesartan-hydrochlorothiazide (BENICAR HCT) 40-12.5 MG tablet, Take 1 tablet at bedtime by mouth., Disp: 90 tablet, Rfl: 1 .  spironolactone (ALDACTONE) 50 MG tablet, Take 1 tablet (50 mg total) by mouth 2 (two) times daily., Disp: 60 tablet, Rfl: 1  Allergies  Allergen Reactions  . Ace Inhibitors Cough  . Latex Itching    Gloves, condoms     ROS  Please see HPI for complete discussion of ROS.  Objective  Vitals:   10/29/17 0925  BP: 104/62  Pulse: 72  Resp: 14  Temp: 98.3 F (36.8 C)  TempSrc: Oral  SpO2: 98%  Weight: 220 lb 6.4 oz (100 kg)  Height: 5' 6"  (1.676 m)    Physical Exam  Constitutional: She is oriented to person, place, and time and well-developed, well-nourished, and in no distress.  HENT:  Head: Normocephalic and atraumatic.  Nose: Right sinus exhibits no maxillary sinus tenderness and no frontal sinus tenderness. Left sinus exhibits no maxillary sinus tenderness and no frontal sinus tenderness.  Cardiovascular: Normal rate, regular rhythm and normal heart sounds.  No murmur heard. Pulmonary/Chest: Effort normal and breath sounds normal. She has no wheezes.  Neurological: She is alert and oriented to person, place, and time.  Psychiatric: Mood, memory, affect and judgment normal.  Nursing note and  vitals reviewed.      Recent Results (from the past 2160 hour(s))  POCT Urinalysis Dipstick     Status: Abnormal   Collection Time: 08/21/17  4:07 PM  Result Value Ref Range   Color, UA     Clarity, UA     Glucose, UA NEG    Bilirubin, UA NEG    Ketones, UA NEG    Spec Grav, UA 1.015 1.010 - 1.025   Blood, UA  NEG    pH, UA 5.0 5.0 - 8.0   Protein, UA NEG    Urobilinogen, UA negative (A) 0.2 or 1.0 E.U./dL   Nitrite, UA NEG    Leukocytes, UA Negative Negative  Urinalysis, Routine w reflex microscopic     Status: None   Collection Time: 08/21/17  4:26 PM  Result Value Ref Range   Color, Urine YELLOW YELLOW   APPearance CLEAR CLEAR   Specific Gravity, Urine 1.026 1.001 - 1.035   pH 6.0 5.0 - 8.0   Glucose, UA NEGATIVE NEGATIVE   Bilirubin Urine NEGATIVE NEGATIVE   Ketones, ur NEGATIVE NEGATIVE   Hgb urine dipstick NEGATIVE NEGATIVE   Protein, ur NEGATIVE NEGATIVE   Nitrite NEGATIVE NEGATIVE   Leukocytes, UA NEGATIVE NEGATIVE  Urine Culture     Status: None   Collection Time: 08/21/17  4:39 PM  Result Value Ref Range   Organism ID, Bacteria      Three or more organisms present,each greater than 10,000 CFU/mL.These organisms,commonly found on external and internal genitalia,are considered to be colonizers.No further testing performed.   Cytology - PAP     Status: Abnormal   Collection Time: 10/23/17 12:00 AM  Result Value Ref Range   Adequacy (A)     Satisfactory for evaluation  endocervical/transformation zone component PRESENT.   Diagnosis (A)     ATYPICAL SQUAMOUS CELLS OF UNDETERMINED SIGNIFICANCE (ASC-US).   HPV NOT DETECTED     Comment: Normal Reference Range - NOT Detected   Material Submitted CervicoVaginal Pap [ThinPrep Imaged] (A)      Assessment & Plan  1. Hypotension due to drugs Hypotension likely because of clonidine, asked to discontinue via taper over the next 5-6 days by decreasing the dose in half and then stopping completely. Continue on  Benicar-HCTZ  2. ADHD (attention deficit hyperactivity disorder), inattentive type DC clonidine and start on Concerta 18 mg daily for symptoms of attention deficit disorder, advised to check blood pressure regularly, and reassess in 1 month - methylphenidate (CONCERTA) 18 MG PO CR tablet; Take 1 tablet (18 mg total) daily by mouth.  Dispense: 30 tablet; Refill: 0   Zulema Pulaski [facility] 10/29/2017 10:07 AM

## 2017-10-30 LAB — CBC
Hematocrit: 36.6 % (ref 34.0–46.6)
Hemoglobin: 11.9 g/dL (ref 11.1–15.9)
MCH: 26.2 pg — ABNORMAL LOW (ref 26.6–33.0)
MCHC: 32.5 g/dL (ref 31.5–35.7)
MCV: 81 fL (ref 79–97)
PLATELETS: 310 10*3/uL (ref 150–379)
RBC: 4.54 x10E6/uL (ref 3.77–5.28)
RDW: 15.4 % (ref 12.3–15.4)
WBC: 6.1 10*3/uL (ref 3.4–10.8)

## 2017-10-30 LAB — COMPREHENSIVE METABOLIC PANEL
A/G RATIO: 1.5 (ref 1.2–2.2)
ALK PHOS: 43 IU/L (ref 39–117)
ALT: 24 IU/L (ref 0–32)
AST: 22 IU/L (ref 0–40)
Albumin: 4 g/dL (ref 3.5–5.5)
BUN/Creatinine Ratio: 9 (ref 9–23)
BUN: 11 mg/dL (ref 6–24)
CHLORIDE: 104 mmol/L (ref 96–106)
CO2: 24 mmol/L (ref 20–29)
Calcium: 9 mg/dL (ref 8.7–10.2)
Creatinine, Ser: 1.23 mg/dL — ABNORMAL HIGH (ref 0.57–1.00)
GFR calc Af Amer: 62 mL/min/{1.73_m2} (ref >59)
GFR calc non Af Amer: 54 mL/min/{1.73_m2} — ABNORMAL LOW (ref >59)
GLUCOSE: 91 mg/dL (ref 65–99)
Globulin, Total: 2.6 g/dL (ref 1.5–4.5)
POTASSIUM: 4.7 mmol/L (ref 3.5–5.2)
Sodium: 143 mmol/L (ref 134–144)
Total Protein: 6.6 g/dL (ref 6.0–8.5)

## 2017-10-30 LAB — LIPID PANEL
CHOLESTEROL TOTAL: 160 mg/dL (ref 100–199)
Chol/HDL Ratio: 3.9 ratio (ref 0.0–4.4)
HDL: 41 mg/dL (ref >39)
LDL Calculated: 105 mg/dL — ABNORMAL HIGH (ref 0–99)
Triglycerides: 68 mg/dL (ref 0–149)
VLDL CHOLESTEROL CAL: 14 mg/dL (ref 5–40)

## 2017-10-30 LAB — TSH: TSH: 2.2 u[IU]/mL (ref 0.450–4.500)

## 2017-11-20 ENCOUNTER — Ambulatory Visit
Admission: RE | Admit: 2017-11-20 | Discharge: 2017-11-20 | Disposition: A | Discharge status: Home / Self Care | Payer: Managed Care, Other (non HMO) | Source: Ambulatory Visit | Attending: Obstetrics & Gynecology | Admitting: Obstetrics & Gynecology

## 2017-11-20 DIAGNOSIS — Z1231 Encounter for screening mammogram for malignant neoplasm of breast: Secondary | ICD-10-CM

## 2017-11-22 ENCOUNTER — Ambulatory Visit (INDEPENDENT_AMBULATORY_CARE_PROVIDER_SITE_OTHER): Payer: Managed Care, Other (non HMO) | Admitting: Family Medicine

## 2017-11-22 ENCOUNTER — Encounter: Payer: Self-pay | Admitting: Family Medicine

## 2017-11-22 VITALS — BP 110/72 | HR 73 | Temp 98.3°F | Resp 14 | Wt 212.1 lb

## 2017-11-22 DIAGNOSIS — I152 Hypertension secondary to endocrine disorders: Secondary | ICD-10-CM

## 2017-11-22 DIAGNOSIS — J0100 Acute maxillary sinusitis, unspecified: Secondary | ICD-10-CM | POA: Diagnosis not present

## 2017-11-22 MED ORDER — FLUTICASONE PROPIONATE 50 MCG/ACT NA SUSP: 2.0000 | Freq: Every day | NASAL | 0 refills | Status: DC

## 2017-11-22 MED ORDER — AZITHROMYCIN 250 MG PO TABS: ORAL_TABLET | ORAL | 0 refills | Status: DC

## 2017-11-22 NOTE — Progress Notes (Signed)
Name: Amanda Fields   MRN: [id number]    DOB: 1976/08/02   Date:11/22/2017       Progress Note  Subjective  Chief Complaint  Chief Complaint  Patient presents with  . Follow-up    1 month; hypotension. Pt states she feel better. Pt BP has been up last Bp reading was 120/70  . Sinus Problem    scrtachy throat, running nose    Sinus Problem  This is a new problem. The current episode started in the past 7 days. The problem is unchanged. There has been no fever. Associated symptoms include congestion, sinus pressure, sneezing and a sore throat. Past treatments include oral decongestants (Mucinex.). The treatment provided no relief.   Pt. Is here for follow up on low blood pressure, she was on Clonidine 0.1 mg daily which was recommedned to be discontinued but she takes it at night to help her sleep and it seems to be working well, no reported side effects.  Blood pressure is at goal today  Past Medical History:  Diagnosis Date  . ADHD (attention deficit hyperactivity disorder), 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  . Adrenal mass, left ([place])   . Anemia    none since Hysterectomy done-related to heavy menses  . Asthma    Related to allergies"wheezes with pollen exposure" no asthma attacks  . Breast lipoma 09/06/2011   resolved- no surgery-tx. medically.  . Cough due to ACE inhibitor   . Dizziness   . Edema leg   . Elevated troponin I level 12/31/2015  . Endometriosis   . Hypertension    a. echo 03/2012 EF 60-65%, moderate LVH, nl LV diastolic fxn, nl PASP; b. echo 12/2015: EF 55-60%  . Hypertensive urgency 12/31/2015  . Migraine headache    migrianes-less frequent now  . Obesity   . Palpitation     Past Surgical History:  Procedure Laterality Date  . COLONOSCOPY WITH PROPOFOL N/A 04/09/2017   Procedure: COLONOSCOPY WITH PROPOFOL;  Surgeon: Lucilla Lame, MD;  Location: [facility];  Service: Endoscopy;  Laterality: N/A;  LATEX sensitivity  . IR GENERIC HISTORICAL  07/07/2016   IR  VENOGRAM RENAL UNI LEFT 07/07/2016 Aletta Edouard, MD [facility]  . IR GENERIC HISTORICAL  07/07/2016   IR VENOUS SAMPLING 07/07/2016 Aletta Edouard, MD [facility]  . IR GENERIC HISTORICAL  07/07/2016   IR VENOGRAM ADRENAL BI 07/07/2016 Aletta Edouard, MD [facility]  . IR GENERIC HISTORICAL  07/07/2016   IR US GUIDE VASC ACCESS RIGHT 07/07/2016 Aletta Edouard, MD [facility]  . IR GENERIC HISTORICAL  07/07/2016   IR ANGIOGRAM SELECTIVE EACH ADDITIONAL VESSEL 07/07/2016 Aletta Edouard, MD [facility]  . IR GENERIC HISTORICAL  07/07/2016   IR VENOUS SAMPLING 07/07/2016 Aletta Edouard, MD [facility]  . IR GENERIC HISTORICAL  07/07/2016   IR US GUIDE VASC ACCESS RIGHT 07/07/2016 Aletta Edouard, MD [facility]  . LAPAROSCOPIC SUPRACERVICAL HYSTERECTOMY  09/27/2010   For endometriosis. Ovaries anf tubes are in place.  Marland Kitchen NASAL SEPTUM SURGERY    . ROBOTIC ADRENALECTOMY Left 10/27/2016   Procedure: XI ROBOTIC LEFT ADRENALECTOMY;  Surgeon: Michael Boston, MD;  Location: [facility];  Service: General;  Laterality: Left;  . TONSILLECTOMY    . TUBAL LIGATION  2006  . WISDOM TOOTH EXTRACTION      Family History  Problem Relation Age of Onset  . COPD Mother   . Hypotension Mother   . Colon cancer Father   . Emphysema Father   . Cancer Father  colon  . Hyperlipidemia Sister   . Heart failure Paternal Uncle   . Heart attack Cousin 32  . Heart disease Cousin        heart attack  . Breast cancer Maternal Aunt   . Breast cancer Other   . Adrenal disorder Neg Hx     Social History   Socioeconomic History  . Marital status: Single    Spouse name: Not on file  . Number of children: 2  . Years of education: Not on file  . Highest education level: Not on file  Social Needs  . Financial resource strain: Not on file  . Food insecurity - worry: Not on file  . Food insecurity - inability: Not on file  . Transportation needs - medical: Not on file  . Transportation needs - non-medical:  Not on file  Occupational History  . Occupation: call center  Tobacco Use  . Smoking status: Never Smoker  . Smokeless tobacco: Never Used  Substance and Sexual Activity  . Alcohol use: Yes    Comment: social (1 drink/mo)  . Drug use: No  . Sexual activity: Yes    Partners: Male    Birth control/protection: Surgical  Other Topics Concern  . Not on file  Social History Narrative  . Not on file     Current Outpatient Medications:  .  calcium-vitamin D 250-100 MG-UNIT tablet, Take 1 tablet by mouth 2 (two) times daily., Disp: 60 tablet, Rfl: 3 .  cloNIDine (CATAPRES) 0.1 MG tablet, Take 1 tablet by mouth daily., Disp: , Rfl: 0 .  ELIDEL 1 % cream, 1 APPLICATION APPLY ON THE SKIN DAILY, Disp: , Rfl: 3 .  escitalopram (LEXAPRO) 20 MG tablet, Take 1 tablet (20 mg total) daily by mouth., Disp: 30 tablet, Rfl: 2 .  levocetirizine (XYZAL) 5 MG tablet, Take 5 mg by mouth daily as needed for allergies. , Disp: , Rfl:  .  methylphenidate (CONCERTA) 18 MG PO CR tablet, Take 1 tablet (18 mg total) daily by mouth., Disp: 30 tablet, Rfl: 0 .  metoprolol succinate (TOPROL-XL) 50 MG 24 hr tablet, Take 1 tablet (50 mg total) by mouth daily., Disp: 90 tablet, Rfl: 0 .  mometasone (NASONEX) 50 MCG/ACT nasal spray, PLACE 2 SPRAYS INTO THE NOSE DAILY., Disp: 17 g, Rfl: 1 .  multivitamin-iron-minerals-folic acid (CENTRUM) chewable tablet, Chew 1 tablet by mouth daily.  , Disp: , Rfl:  .  olmesartan-hydrochlorothiazide (BENICAR HCT) 40-12.5 MG tablet, Take 1 tablet at bedtime by mouth., Disp: 90 tablet, Rfl: 1 .  spironolactone (ALDACTONE) 50 MG tablet, Take 1 tablet (50 mg total) by mouth 2 (two) times daily., Disp: 60 tablet, Rfl: 1  Allergies  Allergen Reactions  . Ace Inhibitors Cough  . Latex Itching    Gloves, condoms     Review of Systems  HENT: Positive for congestion, sinus pressure, sneezing and sore throat.      Objective  Vitals:   11/22/17 0833  BP: 110/72  Pulse: 73  Resp: 14   Temp: 98.3 F (36.8 C)  TempSrc: Oral  SpO2: 95%  Weight: 212 lb 1.6 oz (96.2 kg)    Physical Exam  Constitutional: She is well-developed, well-nourished, and in no distress.  HENT:  Right Ear: Tympanic membrane and ear canal normal. No drainage.  Left Ear: Tympanic membrane and ear canal normal. No drainage.  Nose: Right sinus exhibits maxillary sinus tenderness. Left sinus exhibits maxillary sinus tenderness.  Mouth/Throat: Posterior oropharyngeal erythema present.  Nasal  turbinates enlarged, mucosal inflammation.  Cardiovascular: Normal rate, regular rhythm, S1 normal, S2 normal and normal heart sounds.  Pulmonary/Chest: Breath sounds normal. She has no wheezes.  Nursing note and vitals reviewed.     Assessment & Plan  1. Acute non-recurrent maxillary sinusitis Acute maxillary sinusitis by history and exam, start on azithromycin, change Nasonex to Flonase, follow-up if no improvement - azithromycin (ZITHROMAX) 250 MG tablet; 2 tabs po day 1, then 1 tab po q  day x 4 days  Dispense: 6 each; Refill: 0 - fluticasone (FLONASE) 50 MCG/ACT nasal spray; Place 2 sprays into both nostrils daily.  Dispense: 16 g; Refill: 0  2. Hypertension due to endocrine disorder Blood pressure is stable on antihypertensive treatment, may continue to take clonidine at night   Alejandro Adcox [facility] 11/22/2017 8:36 AM

## 2017-11-23 ENCOUNTER — Other Ambulatory Visit: Payer: Self-pay | Admitting: Family Medicine

## 2017-11-23 DIAGNOSIS — F900 Attention-deficit hyperactivity disorder, predominantly inattentive type: Secondary | ICD-10-CM

## 2017-11-26 ENCOUNTER — Other Ambulatory Visit: Payer: Self-pay | Admitting: Endocrinology

## 2017-11-26 NOTE — Telephone Encounter (Signed)
Please refill x 2 mos Ov is due

## 2017-11-28 ENCOUNTER — Other Ambulatory Visit: Payer: Self-pay

## 2017-11-28 ENCOUNTER — Other Ambulatory Visit: Payer: Self-pay | Admitting: Family Medicine

## 2017-11-28 DIAGNOSIS — F900 Attention-deficit hyperactivity disorder, predominantly inattentive type: Secondary | ICD-10-CM

## 2017-11-28 MED ORDER — SPIRONOLACTONE 50 MG PO TABS: 50.0000 mg | ORAL_TABLET | Freq: Two times a day (BID) | ORAL | 1 refills | Status: DC

## 2017-11-28 NOTE — Telephone Encounter (Signed)
Copied from [place]. Topic: General - Other >> Nov 28, 2017  4:48 PM Cecelia Byars, NT wrote: Patient would like to get refill conserta, and also zithromax did not work please call [phone]

## 2017-11-29 MED ORDER — METHYLPHENIDATE HCL ER (OSM) 18 MG PO TBCR: 18.0000 mg | EXTENDED_RELEASE_TABLET | Freq: Every day | ORAL | 0 refills | Status: DC

## 2017-11-29 NOTE — Telephone Encounter (Signed)
Patient states she is having brown and yellow slimy mucus. Throat still feel like trash is back of it and itchy in the throat and ear still feel full with drainage. Pt can not breath at night and it keeps her up.Picked up the medicine on 11/23/2017 Pt last pill taken of the antibiotic was  Tuesday 11/30/2017. Patient states she also did not get concerta when she was hear on the 11/22/2017

## 2017-11-30 ENCOUNTER — Telehealth: Payer: Self-pay

## 2017-11-30 NOTE — Telephone Encounter (Signed)
She was diagnosed with acute maxillary sinusitis on 11/22/2017, if she is still experiencing symptoms after completing an antibiotic course, she should schedule an appointment to discuss starting a broader spectrum antibiotic plus other medications

## 2017-11-30 NOTE — Telephone Encounter (Signed)
Patient states she is having brown and yellow slimy mucus. Throat still feel like trash is back of it and itchy in the throat and ear still feel full with drainage. Pt can not breath at night and it keeps her up.Picked up the medicine on 11/23/2017 Pt last pill taken of the antibiotic was  Tuesday 11/30/2017.  Please advise

## 2017-11-30 NOTE — Telephone Encounter (Signed)
Pt is scheduled for 12/01/17

## 2017-12-01 ENCOUNTER — Encounter: Payer: Self-pay | Admitting: Family Medicine

## 2017-12-01 ENCOUNTER — Ambulatory Visit (INDEPENDENT_AMBULATORY_CARE_PROVIDER_SITE_OTHER): Payer: Managed Care, Other (non HMO) | Admitting: Family Medicine

## 2017-12-01 VITALS — BP 118/80 | HR 78 | Temp 97.9°F | Ht 66.0 in | Wt 216.0 lb

## 2017-12-01 DIAGNOSIS — H66001 Acute suppurative otitis media without spontaneous rupture of ear drum, right ear: Secondary | ICD-10-CM | POA: Diagnosis not present

## 2017-12-01 DIAGNOSIS — J0140 Acute pansinusitis, unspecified: Secondary | ICD-10-CM | POA: Diagnosis not present

## 2017-12-01 DIAGNOSIS — B373 Candidiasis of vulva and vagina: Secondary | ICD-10-CM

## 2017-12-01 DIAGNOSIS — B3731 Acute candidiasis of vulva and vagina: Secondary | ICD-10-CM

## 2017-12-01 MED ORDER — AMOXICILLIN-POT CLAVULANATE 875-125 MG PO TABS: 1.0000 | ORAL_TABLET | Freq: Two times a day (BID) | ORAL | 0 refills | Status: AC

## 2017-12-01 MED ORDER — FLUCONAZOLE 150 MG PO TABS: 150.0000 mg | ORAL_TABLET | Freq: Once | ORAL | 1 refills | Status: AC

## 2017-12-01 NOTE — Progress Notes (Signed)
Name: Amanda Fields   MRN: [id number]    DOB: 15-Apr-1976   Date:12/01/2017       Progress Note  Subjective  Chief Complaint  Chief Complaint  Patient presents with  . Sinusitis    Pt states that she completed z pack, no improvement, would like diflucan to prevent yeast infection w/ antibiotics     HPI  Sinusitis: Was diagnosed 11/22/2017 and given Azithromycin - this was not effective.  She has continued to have nasal congestion, sinus pain and pressure, rhinorrhea, sore throat, occasional cough.  Denies chest pain or shortness of breath., body aches, fevers/chills, NVD, or abdominal pain.  Has been using flonase daily, Xyzal daily - will continue.  Patient Active Problem List   Diagnosis Date Noted  . ADHD (attention deficit hyperactivity disorder), 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  . Palpitation 07/12/2017  . Family history of malignant neoplasm of gastrointestinal tract   . Blood in stool   . Bright red rectal bleeding 03/20/2017  . Acute non-recurrent maxillary sinusitis 01/15/2017  . 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 Adenoma of left adrenal gland 10/27/2016  . Primary hyperaldosteronism s/p robotic left adrenalectomy 10/27/2016 04/11/2016  . Breast pain, right 03/16/2016  . Allergic rhinitis 02/04/2016  . Hypokalemia 01/04/2016  . Proteinuria 01/04/2016  . Leukocytosis 01/04/2016  . Elevated total protein 01/04/2016  . Pelvic pain in female 07/10/2012  . Bilateral leg edema 04/09/2012  . 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 Hypertension     Social History   Tobacco Use  . Smoking status: Never Smoker  . Smokeless tobacco: Never Used  Substance Use Topics  . Alcohol use: Yes    Comment: social (1 drink/mo)     Current Outpatient Medications:  .  calcium-vitamin D 250-100 MG-UNIT tablet, Take 1 tablet by mouth 2 (two) times daily., Disp: 60 tablet, Rfl: 3 .  cloNIDine (CATAPRES) 0.1 MG tablet, TAKE 1 TABLET (0.1 MG TOTAL) DAILY BY MOUTH., Disp: 30 tablet, Rfl: 0 .  ELIDEL 1 %  cream, 1 APPLICATION APPLY ON THE SKIN DAILY, Disp: , Rfl: 3 .  escitalopram (LEXAPRO) 20 MG tablet, Take 1 tablet (20 mg total) daily by mouth., Disp: 30 tablet, Rfl: 2 .  fluticasone (FLONASE) 50 MCG/ACT nasal spray, Place 2 sprays into both nostrils daily., Disp: 16 g, Rfl: 0 .  levocetirizine (XYZAL) 5 MG tablet, Take 5 mg by mouth daily as needed for allergies. , Disp: , Rfl:  .  methylphenidate (CONCERTA) 18 MG PO CR tablet, Take 1 tablet (18 mg total) by mouth daily., Disp: 30 tablet, Rfl: 0 .  metoprolol succinate (TOPROL-XL) 50 MG 24 hr tablet, Take 1 tablet (50 mg total) by mouth daily., Disp: 90 tablet, Rfl: 0 .  multivitamin-iron-minerals-folic acid (CENTRUM) chewable tablet, Chew 1 tablet by mouth daily.  , Disp: , Rfl:  .  olmesartan-hydrochlorothiazide (BENICAR HCT) 40-12.5 MG tablet, Take 1 tablet at bedtime by mouth., Disp: 90 tablet, Rfl: 1 .  spironolactone (ALDACTONE) 50 MG tablet, Take 1 tablet (50 mg total) by mouth 2 (two) times daily., Disp: 60 tablet, Rfl: 1 .  azithromycin (ZITHROMAX) 250 MG tablet, 2 tabs po day 1, then 1 tab po q  day x 4 days, Disp: 6 each, Rfl: 0  Allergies  Allergen Reactions  . Ace Inhibitors Cough  . Latex Itching    Gloves, condoms    ROS  Ten systems reviewed and is negative except as mentioned in HPI.  Objective  Vitals:   12/01/17 1015  BP:  118/80  Pulse: 78  Temp: 97.9 F (36.6 C)  TempSrc: Oral  SpO2: 97%  Weight: 216 lb (98 kg)  Height: 5' 6"  (1.676 m)    Body mass index is 34.86 kg/m.  Nursing Note and Vital Signs reviewed.  Physical Exam  Constitutional: Patient appears well-developed and well-nourished. Obese. No distress.  HEENT: head atraumatic, normocephalic, pupils equal and reactive to light, EOM's intact, RIGHT TM is erythematous and bulging, bilateral maxillary and frontal sinus tenderness, neck supple with bilateral submandibular lymphadenopathy, oropharynx pink and moist without exudate Cardiovascular:  Normal rate, regular rhythm, S1/S2 present.  No murmur or rub heard. No BLE edema. Pulmonary/Chest: Effort normal and breath sounds clear. No respiratory distress or retractions. Psychiatric: Patient has a normal mood and affect. behavior is normal. Judgment and thought content normal.  Recent Results (from the past 2160 hour(s))  Cytology - PAP     Status: Abnormal   Collection Time: 10/23/17 12:00 AM  Result Value Ref Range   Adequacy (A)     Satisfactory for evaluation  endocervical/transformation zone component PRESENT.   Diagnosis (A)     ATYPICAL SQUAMOUS CELLS OF UNDETERMINED SIGNIFICANCE (ASC-US).   HPV NOT DETECTED     Comment: Normal Reference Range - NOT Detected   Material Submitted CervicoVaginal Pap [ThinPrep Imaged] (A)   CBC     Status: Abnormal   Collection Time: 10/29/17  8:22 AM  Result Value Ref Range   WBC 6.1 3.4 - 10.8 x10E3/uL   RBC 4.54 3.77 - 5.28 x10E6/uL   Hemoglobin 11.9 11.1 - 15.9 g/dL   Hematocrit 36.6 34.0 - 46.6 %   MCV 81 79 - 97 fL   MCH 26.2 (L) 26.6 - 33.0 pg   MCHC 32.5 31.5 - 35.7 g/dL   RDW 15.4 12.3 - 15.4 %   Platelets 310 150 - 379 x10E3/uL  Comprehensive metabolic panel     Status: Abnormal   Collection Time: 10/29/17  8:22 AM  Result Value Ref Range   Glucose 91 65 - 99 mg/dL   BUN 11 6 - 24 mg/dL   Creatinine, Ser 1.23 (H) 0.57 - 1.00 mg/dL   GFR calc non Af Amer 54 (L) >59 mL/min/1.73   GFR calc Af Amer 62 >59 mL/min/1.73   BUN/Creatinine Ratio 9 9 - 23   Sodium 143 134 - 144 mmol/L   Potassium 4.7 3.5 - 5.2 mmol/L   Chloride 104 96 - 106 mmol/L   CO2 24 20 - 29 mmol/L   Calcium 9.0 8.7 - 10.2 mg/dL   Total Protein 6.6 6.0 - 8.5 g/dL   Albumin 4.0 3.5 - 5.5 g/dL   Globulin, Total 2.6 1.5 - 4.5 g/dL   Albumin/Globulin Ratio 1.5 1.2 - 2.2   Bilirubin Total <0.2 0.0 - 1.2 mg/dL   Alkaline Phosphatase 43 39 - 117 IU/L   AST 22 0 - 40 IU/L   ALT 24 0 - 32 IU/L  Lipid panel     Status: Abnormal   Collection Time: 10/29/17   8:22 AM  Result Value Ref Range   Cholesterol, Total 160 100 - 199 mg/dL   Triglycerides 68 0 - 149 mg/dL   HDL 41 >39 mg/dL   VLDL Cholesterol Cal 14 5 - 40 mg/dL   LDL Calculated 105 (H) 0 - 99 mg/dL   Chol/HDL Ratio 3.9 0.0 - 4.4 ratio    Comment:  T. Chol/HDL Ratio                                             Men  Women                               1/2 Avg.Risk  3.4    3.3                                   Avg.Risk  5.0    4.4                                2X Avg.Risk  9.6    7.1                                3X Avg.Risk 23.4   11.0   TSH     Status: None   Collection Time: 10/29/17  8:22 AM  Result Value Ref Range   TSH 2.200 0.450 - 4.500 uIU/mL     Assessment & Plan 1. Acute non-recurrent pansinusitis - amoxicillin-clavulanate (AUGMENTIN) 875-125 MG tablet; Take 1 tablet by mouth 2 (two) times daily for 10 days.  Dispense: 20 tablet; Refill: 0 - Continue flonase and Xyzal.  2. Acute suppurative otitis media of right ear without spontaneous rupture of tympanic membrane, recurrence not specified - amoxicillin-clavulanate (AUGMENTIN) 875-125 MG tablet; Take 1 tablet by mouth 2 (two) times daily for 10 days.  Dispense: 20 tablet; Refill: 0 - Continue flonase and Xyzal.  3. Vaginal candidiasis - fluconazole (DIFLUCAN) 150 MG tablet; Take 1 tablet (150 mg total) by mouth once for 1 dose. May repeat in 72 hours if not improving.  Dispense: 1 tablet; Refill: 1  -Red flags and when to present for emergency care or RTC including fever >101.71F, chest pain, shortness of breath, new/worsening/un-resolving symptoms, pain with ocular movement or periorbital swelling. reviewed with patient at time of visit. Follow up and care instructions discussed and provided in AVS.

## 2017-12-01 NOTE — Patient Instructions (Addendum)
Take probiotic or eat probiotic yogurt while taking Augmentin and for 1 week afterwards. Otitis Media, Adult Otitis media is redness, soreness, and puffiness (swelling) in the space just behind your eardrum (middle ear). It may be caused by allergies or infection. It often happens along with a cold. Follow these instructions at home:  Take your medicine as told. Finish it even if you start to feel better.  Only take over-the-counter or prescription medicines for pain, discomfort, or fever as told by your doctor.  Follow up with your doctor as told. Contact a doctor if:  You have otitis media only in one ear, or bleeding from your nose, or both.  You notice a lump on your neck.  You are not getting better in 3-5 days.  You feel worse instead of better. Get help right away if:  You have pain that is not helped with medicine.  You have puffiness, redness, or pain around your ear.  You get a stiff neck.  You cannot move part of your face (paralysis).  You notice that the bone behind your ear hurts when you touch it. This information is not intended to replace advice given to you by your health care provider. Make sure you discuss any questions you have with your health care provider. Document Released: 05/22/2008 Document Revised: 05/11/2016 Document Reviewed: 07/01/2013 Elsevier Interactive Patient Education  2017 Elsevier Inc.  Sinusitis, Adult Sinusitis is soreness and inflammation of your sinuses. Sinuses are hollow spaces in the bones around your face. They are located:  Around your eyes.  In the middle of your forehead.  Behind your nose.  In your cheekbones.  Your sinuses and nasal passages are lined with a stringy fluid (mucus). Mucus normally drains out of your sinuses. When your nasal tissues get inflamed or swollen, the mucus can get trapped or blocked so air cannot flow through your sinuses. This lets bacteria, viruses, and funguses grow, and that leads to  infection. Follow these instructions at home: Medicines  Take, use, or apply over-the-counter and prescription medicines only as told by your doctor. These may include nasal sprays.  If you were prescribed an antibiotic medicine, take it as told by your doctor. Do not stop taking the antibiotic even if you start to feel better. Hydrate and Humidify  Drink enough water to keep your pee (urine) clear or pale yellow.  Use a cool mist humidifier to keep the humidity level in your home above 50%.  Breathe in steam for 10-15 minutes, 3-4 times a day or as told by your doctor. You can do this in the bathroom while a hot shower is running.  Try not to spend time in cool or dry air. Rest  Rest as much as possible.  Sleep with your head raised (elevated).  Make sure to get enough sleep each night. General instructions  Put a warm, moist washcloth on your face 3-4 times a day or as told by your doctor. This will help with discomfort.  Wash your hands often with soap and water. If there is no soap and water, use hand sanitizer.  Do not smoke. Avoid being around people who are smoking (secondhand smoke).  Keep all follow-up visits as told by your doctor. This is important. Contact a doctor if:  You have a fever.  Your symptoms get worse.  Your symptoms do not get better within 10 days. Get help right away if:  You have a very bad headache.  You cannot stop throwing up (vomiting).  You have pain or swelling around your face or eyes.  You have trouble seeing.  You feel confused.  Your neck is stiff.  You have trouble breathing. This information is not intended to replace advice given to you by your health care provider. Make sure you discuss any questions you have with your health care provider. Document Released: 05/22/2008 Document Revised: 07/30/2016 Document Reviewed: 09/29/2015 Elsevier Interactive Patient Education  [employer].

## 2017-12-06 ENCOUNTER — Encounter: Payer: Self-pay | Admitting: *Deleted

## 2017-12-17 ENCOUNTER — Other Ambulatory Visit: Payer: Self-pay

## 2017-12-17 DIAGNOSIS — R002 Palpitations: Secondary | ICD-10-CM

## 2017-12-17 DIAGNOSIS — J0100 Acute maxillary sinusitis, unspecified: Secondary | ICD-10-CM

## 2017-12-17 NOTE — Telephone Encounter (Signed)
[facility] request for general medication[facility]    Last office visit: 12/01/2017  No Follow-up on file.

## 2017-12-19 ENCOUNTER — Ambulatory Visit: Payer: Managed Care, Other (non HMO)

## 2017-12-19 MED ORDER — FLUTICASONE PROPIONATE 50 MCG/ACT NA SUSP: 2.0000 | Freq: Every day | NASAL | 0 refills | Status: DC

## 2017-12-19 MED ORDER — METOPROLOL SUCCINATE ER 50 MG PO TB24: 50.0000 mg | ORAL_TABLET | Freq: Every day | ORAL | 0 refills | Status: DC

## 2017-12-25 ENCOUNTER — Ambulatory Visit (INDEPENDENT_AMBULATORY_CARE_PROVIDER_SITE_OTHER): Payer: Self-pay

## 2017-12-25 VITALS — BP 107/84 | HR 72

## 2017-12-25 DIAGNOSIS — Z23 Encounter for immunization: Secondary | ICD-10-CM

## 2017-12-25 NOTE — Progress Notes (Signed)
Agree with nursing staff's documentation of this patient's clinic encounter.  Mora Bellman, MD 12/25/2017 2:45 PM

## 2017-12-25 NOTE — Progress Notes (Signed)
Patient presented to the office for gardisil injection. Patient tolerated well.

## 2018-01-01 ENCOUNTER — Other Ambulatory Visit: Payer: Self-pay

## 2018-01-01 MED ORDER — SPIRONOLACTONE 50 MG PO TABS: 50.0000 mg | ORAL_TABLET | Freq: Two times a day (BID) | ORAL | 0 refills | Status: DC

## 2018-01-02 ENCOUNTER — Other Ambulatory Visit: Payer: Self-pay

## 2018-01-02 DIAGNOSIS — F900 Attention-deficit hyperactivity disorder, predominantly inattentive type: Secondary | ICD-10-CM

## 2018-01-02 IMAGING — CR DG CHEST 2V
2 series · 2 of 2 positions shown · non-contrast
Comparison: Chest radiograph from 07/17/2012

CLINICAL DATA: Acute onset of high blood pressure. Headache and
burning sensation in the chest. Nausea and vomiting. Initial
encounter.

EXAM:
CHEST  2 VIEW

[chest pa]
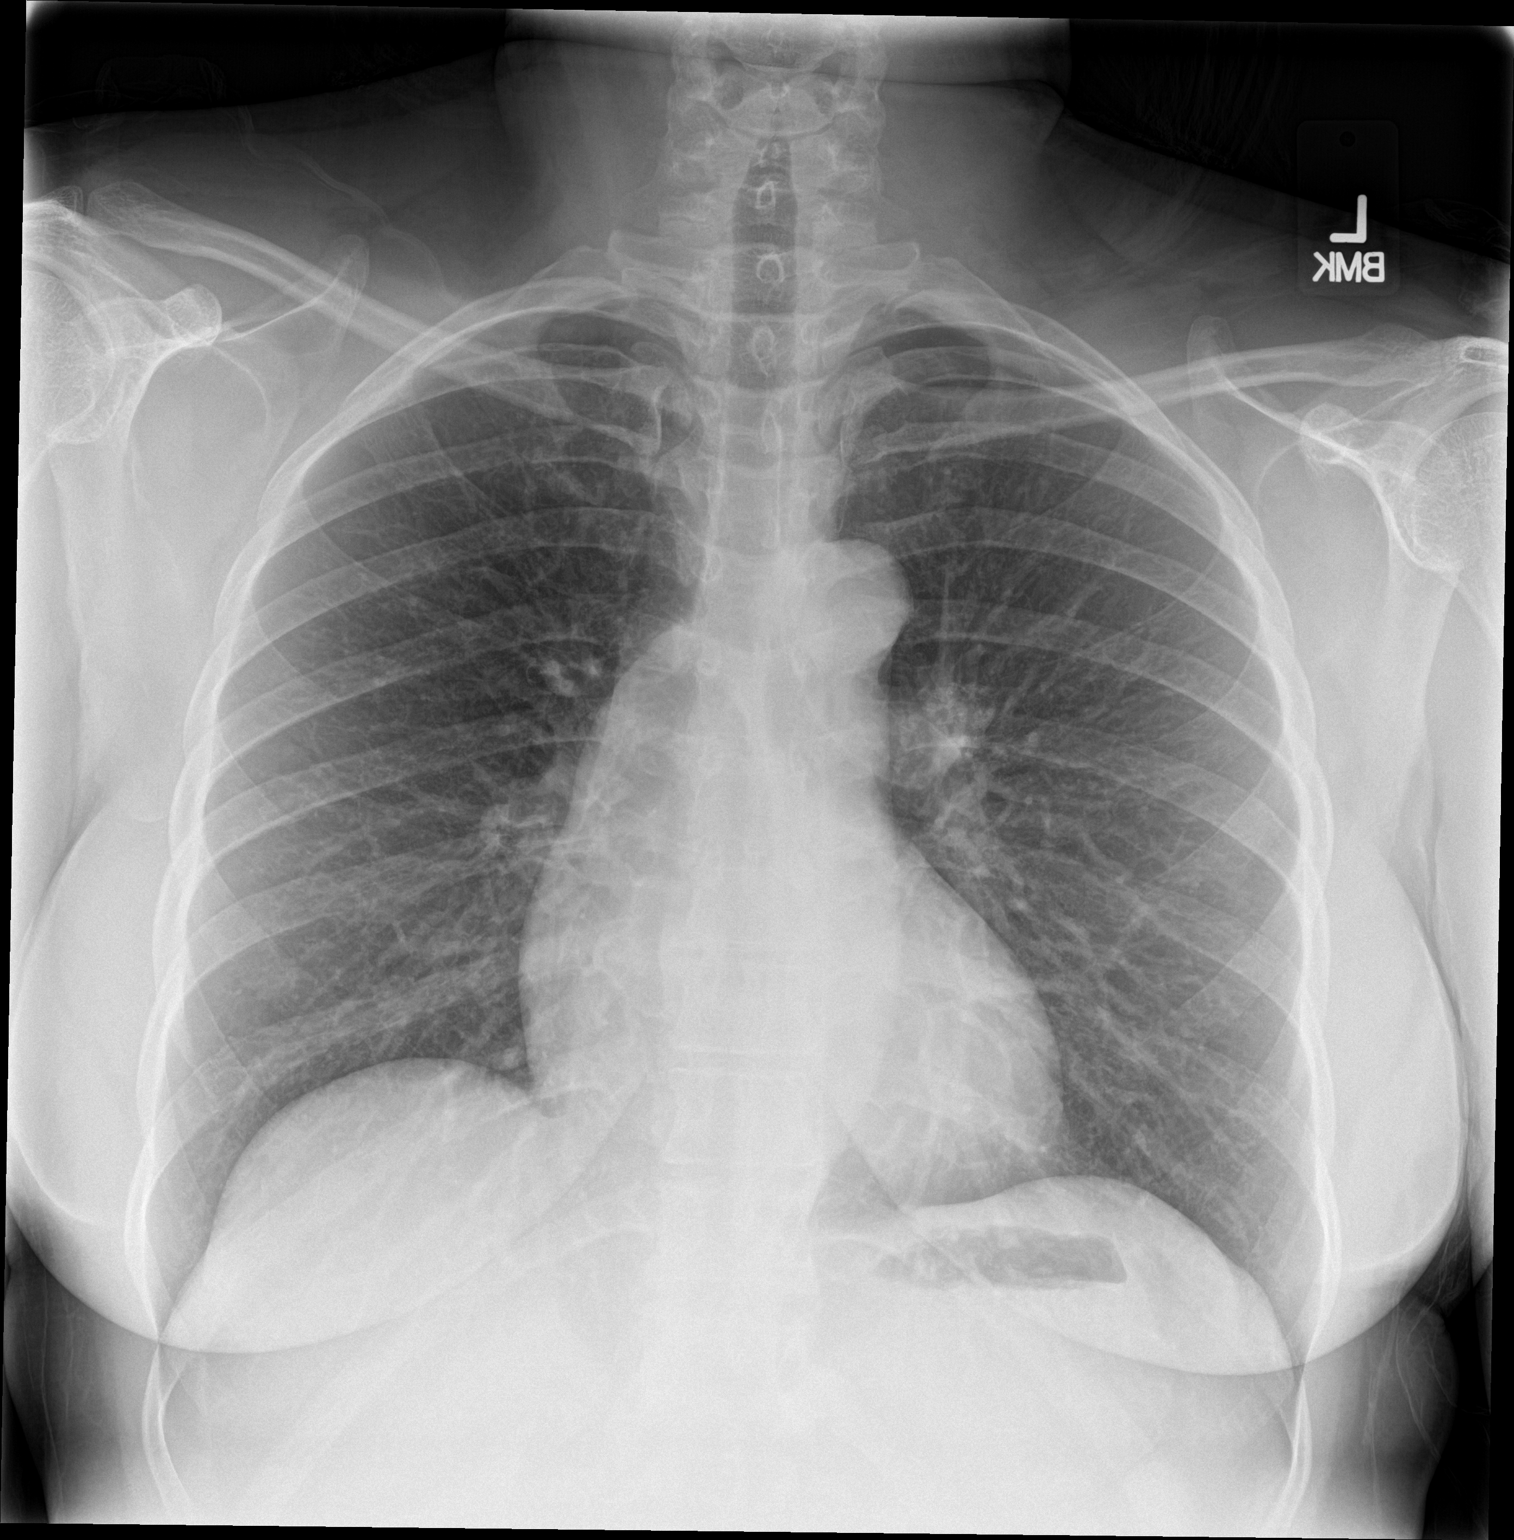

[chest lat]
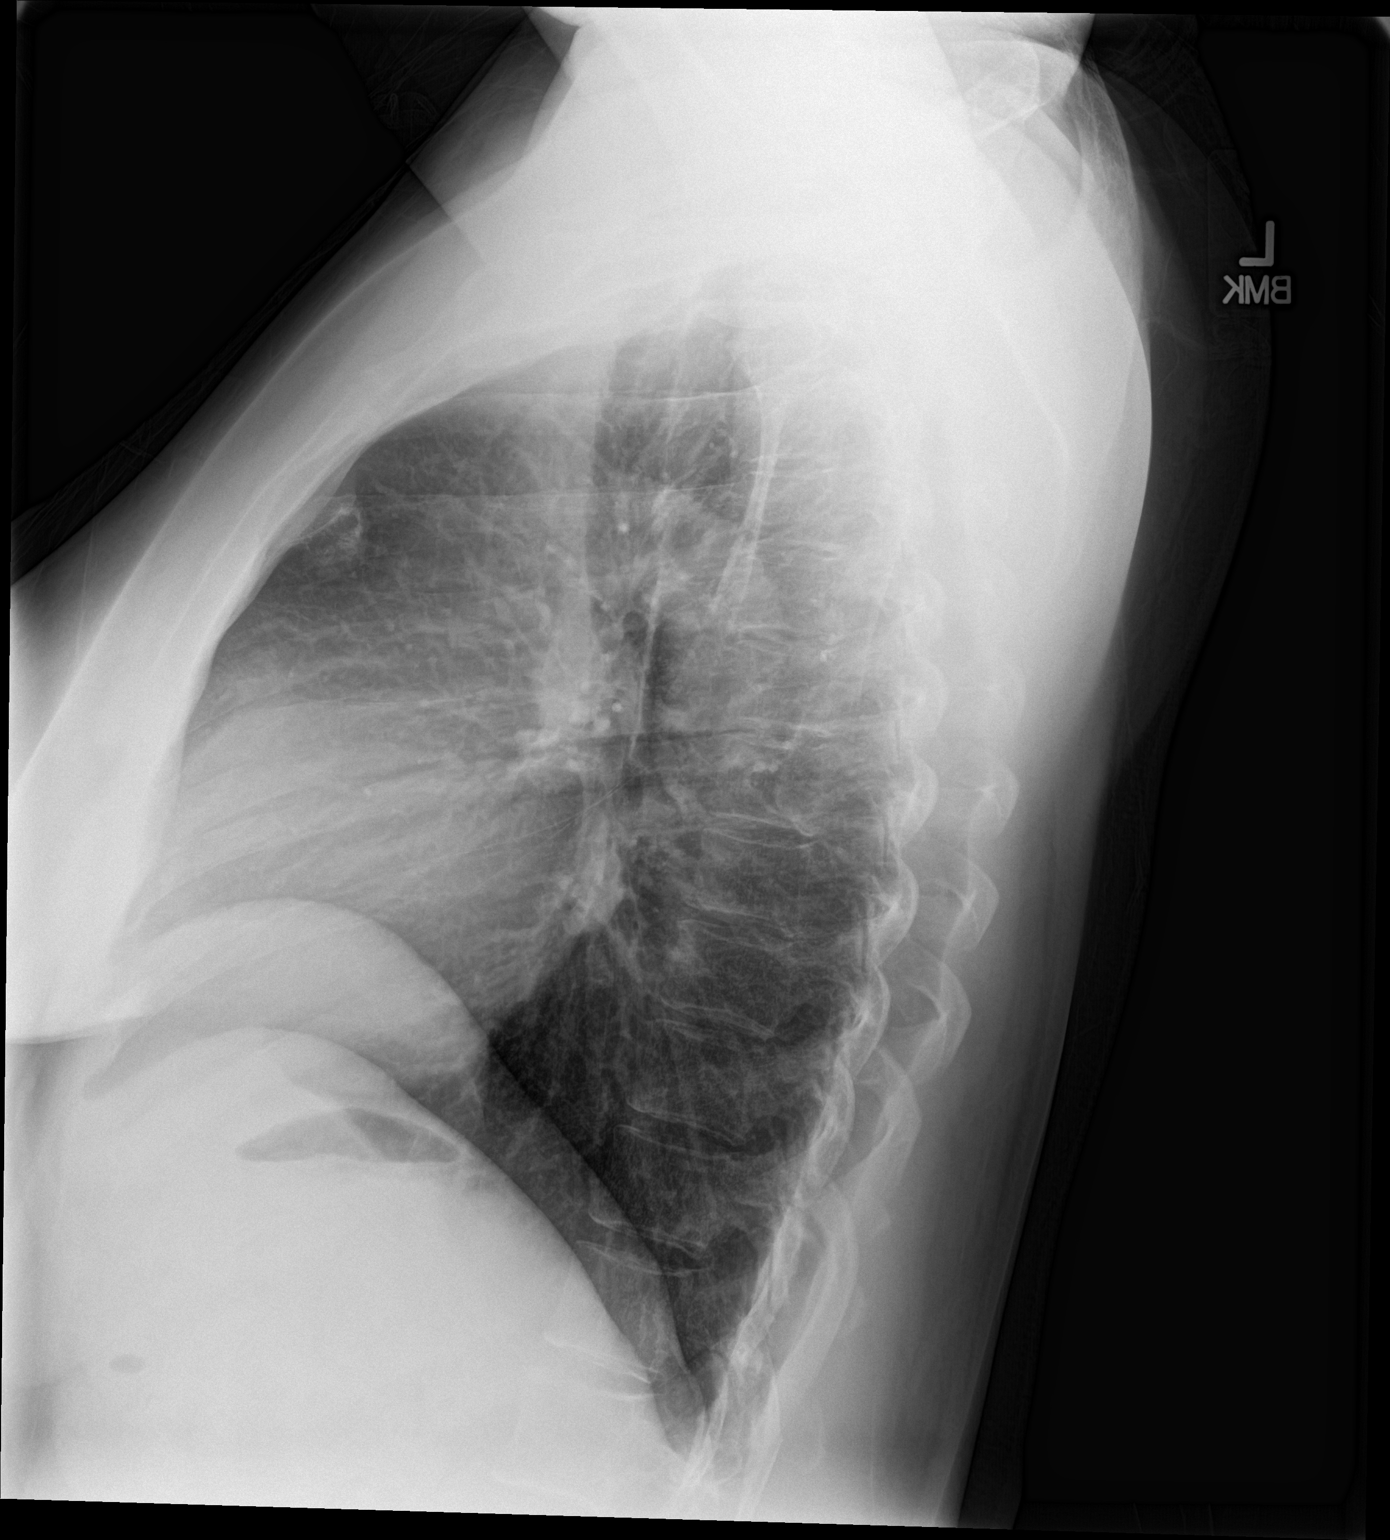

[2 of 2 positions shown; findings below may reference images not displayed]

FINDINGS: The lungs are well-aerated and clear. There is no evidence of focal
opacification, pleural effusion or pneumothorax.

The heart is normal in size; the mediastinal contour is within
normal limits. No acute osseous abnormalities are seen.
IMPRESSION: No acute cardiopulmonary process seen.

## 2018-01-02 MED ORDER — CLONIDINE HCL 0.1 MG PO TABS: 0.1000 mg | ORAL_TABLET | Freq: Every day | ORAL | 0 refills | Status: DC

## 2018-01-07 ENCOUNTER — Other Ambulatory Visit: Payer: Self-pay | Admitting: Family Medicine

## 2018-01-07 DIAGNOSIS — F900 Attention-deficit hyperactivity disorder, predominantly inattentive type: Secondary | ICD-10-CM

## 2018-01-07 NOTE — Telephone Encounter (Signed)
Copied from [place] [id number]. Topic: Quick Communication - Rx Refill/Question >> Jan 07, 2018  3:49 PM Ahmed Prima L wrote: Medication: methylphenidate (CONCERTA) 18 MG PO CR tablet  Has the patient contacted their pharmacy? No bc it is a controlled substance    (Agent: If no, request that the patient contact the pharmacy for the refill.)   Preferred Pharmacy (with phone number or street name): [id number] [place] Please be advised that RX refills may take up to 3 business days. We ask that you follow-up with your pharmacy.

## 2018-01-10 MED ORDER — METHYLPHENIDATE HCL ER (OSM) 18 MG PO TBCR: 18.0000 mg | EXTENDED_RELEASE_TABLET | Freq: Every day | ORAL | 0 refills | Status: DC

## 2018-02-01 ENCOUNTER — Encounter: Payer: Self-pay | Admitting: Family Medicine

## 2018-02-01 ENCOUNTER — Ambulatory Visit: Payer: Managed Care, Other (non HMO) | Admitting: Family Medicine

## 2018-02-01 VITALS — BP 138/86 | HR 80 | Temp 98.2°F | Resp 14 | Wt 219.2 lb

## 2018-02-01 DIAGNOSIS — F900 Attention-deficit hyperactivity disorder, predominantly inattentive type: Secondary | ICD-10-CM

## 2018-02-01 DIAGNOSIS — J329 Chronic sinusitis, unspecified: Secondary | ICD-10-CM

## 2018-02-01 DIAGNOSIS — M25522 Pain in left elbow: Secondary | ICD-10-CM

## 2018-02-01 DIAGNOSIS — I152 Hypertension secondary to endocrine disorders: Secondary | ICD-10-CM

## 2018-02-01 DIAGNOSIS — N951 Menopausal and female climacteric states: Secondary | ICD-10-CM

## 2018-02-01 MED ORDER — METHYLPHENIDATE HCL ER (OSM) 18 MG PO TBCR: 18.0000 mg | EXTENDED_RELEASE_TABLET | Freq: Every day | ORAL | 0 refills | Status: DC

## 2018-02-01 MED ORDER — MOMETASONE FUROATE 50 MCG/ACT NA SUSP: 2.0000 | Freq: Every day | NASAL | 0 refills | Status: DC

## 2018-02-01 MED ORDER — ESCITALOPRAM OXALATE 20 MG PO TABS: 20.0000 mg | ORAL_TABLET | Freq: Every day | ORAL | 2 refills | Status: DC

## 2018-02-01 NOTE — Progress Notes (Signed)
Name: Amanda Fields   MRN: [id number]    DOB: 1976/12/08   Date:02/01/2018       Progress Note  Subjective  Chief Complaint  Chief Complaint  Patient presents with  . Follow-up  . Sinusitis    onset has not fully went away since dec. symtoms include: blood in mucous, pressure and cough  . Arm Pain    hit elbow 2 weeks ago now having pain under her underarm left side  . Hypertension  . Depression  . ADHD  . Medication Refill    concerta    Sinusitis  This is a recurrent problem. Associated symptoms include congestion (nasal congestion. ), headaches and sinus pressure. Pertinent negatives include no sore throat or swollen glands. (Thick yellow mucus) Past treatments include antibiotics (has taken Amoxicillin and Azithromycin in December.).   Pt. Has Attention Deficit Disorder with symptoms including difficulty with attention and focusing, leaving tasks unfinished, she has been taking Concerta 18 mg daily, which seems to help relieve her symptoms, no reported side effects.  Pt. hit her left elbow on a door frame while taking clothes to the laundry room, felt immediate pain and numbness, now improving but still feels pain in the elbow joint and numbness in her fingers, otherwise normal strength.      Past Medical History:  Diagnosis Date  . ADHD (attention deficit hyperactivity disorder), 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  . Adrenal mass, left ([place])   . Anemia    none since Hysterectomy done-related to heavy menses  . Asthma    Related to allergies"wheezes with pollen exposure" no asthma attacks  . Breast lipoma 09/06/2011   resolved- no surgery-tx. medically.  . Cough due to ACE inhibitor   . Dizziness   . Edema leg   . Elevated troponin I level 12/31/2015  . Endometriosis   . Hypertension    a. echo 03/2012 EF 60-65%, moderate LVH, nl LV diastolic fxn, nl PASP; b. echo 12/2015: EF 55-60%  . Hypertensive urgency 12/31/2015  . Migraine headache    migrianes-less frequent now  .  Obesity   . Palpitation     Past Surgical History:  Procedure Laterality Date  . COLONOSCOPY WITH PROPOFOL N/A 04/09/2017   Procedure: COLONOSCOPY WITH PROPOFOL;  Surgeon: Lucilla Lame, MD;  Location: [facility];  Service: Endoscopy;  Laterality: N/A;  LATEX sensitivity  . IR GENERIC HISTORICAL  07/07/2016   IR VENOGRAM RENAL UNI LEFT 07/07/2016 Aletta Edouard, MD [facility]  . IR GENERIC HISTORICAL  07/07/2016   IR VENOUS SAMPLING 07/07/2016 Aletta Edouard, MD [facility]  . IR GENERIC HISTORICAL  07/07/2016   IR VENOGRAM ADRENAL BI 07/07/2016 Aletta Edouard, MD [facility]  . IR GENERIC HISTORICAL  07/07/2016   IR US GUIDE VASC ACCESS RIGHT 07/07/2016 Aletta Edouard, MD [facility]  . IR GENERIC HISTORICAL  07/07/2016   IR ANGIOGRAM SELECTIVE EACH ADDITIONAL VESSEL 07/07/2016 Aletta Edouard, MD [facility]  . IR GENERIC HISTORICAL  07/07/2016   IR VENOUS SAMPLING 07/07/2016 Aletta Edouard, MD [facility]  . IR GENERIC HISTORICAL  07/07/2016   IR US GUIDE VASC ACCESS RIGHT 07/07/2016 Aletta Edouard, MD [facility]  . LAPAROSCOPIC SUPRACERVICAL HYSTERECTOMY  09/27/2010   For endometriosis. Ovaries anf tubes are in place.  Marland Kitchen NASAL SEPTUM SURGERY    . ROBOTIC ADRENALECTOMY Left 10/27/2016   Procedure: XI ROBOTIC LEFT ADRENALECTOMY;  Surgeon: Michael Boston, MD;  Location: [facility];  Service: General;  Laterality: Left;  . TONSILLECTOMY    .  TUBAL LIGATION  2006  . WISDOM TOOTH EXTRACTION      Family History  Problem Relation Age of Onset  . COPD Mother   . Hypotension Mother   . Colon cancer Father   . Emphysema Father   . Cancer Father        colon  . Hyperlipidemia Sister   . Heart failure Paternal Uncle   . Heart attack Cousin 32  . Heart disease Cousin        heart attack  . Breast cancer Maternal Aunt   . Breast cancer Other   . Adrenal disorder Neg Hx     Social History   Socioeconomic History  . Marital status: Single    Spouse name: Not on file  .  Number of children: 2  . Years of education: Not on file  . Highest education level: Not on file  Social Needs  . Financial resource strain: Not on file  . Food insecurity - worry: Not on file  . Food insecurity - inability: Not on file  . Transportation needs - medical: Not on file  . Transportation needs - non-medical: Not on file  Occupational History  . Occupation: call center  Tobacco Use  . Smoking status: Never Smoker  . Smokeless tobacco: Never Used  Substance and Sexual Activity  . Alcohol use: Yes    Comment: social (1 drink/mo)  . Drug use: No  . Sexual activity: Yes    Partners: Male    Birth control/protection: Surgical  Other Topics Concern  . Not on file  Social History Narrative  . Not on file     Current Outpatient Medications:  .  calcium-vitamin D 250-100 MG-UNIT tablet, Take 1 tablet by mouth 2 (two) times daily., Disp: 60 tablet, Rfl: 3 .  cloNIDine (CATAPRES) 0.1 MG tablet, Take 1 tablet (0.1 mg total) by mouth daily., Disp: 30 tablet, Rfl: 0 .  escitalopram (LEXAPRO) 20 MG tablet, Take 1 tablet (20 mg total) daily by mouth., Disp: 30 tablet, Rfl: 2 .  fluticasone (FLONASE) 50 MCG/ACT nasal spray, Place 2 sprays into both nostrils daily., Disp: 16 g, Rfl: 0 .  levocetirizine (XYZAL) 5 MG tablet, Take 5 mg by mouth daily as needed for allergies. , Disp: , Rfl:  .  methylphenidate (CONCERTA) 18 MG PO CR tablet, Take 1 tablet (18 mg total) by mouth daily., Disp: 30 tablet, Rfl: 0 .  metoprolol succinate (TOPROL-XL) 50 MG 24 hr tablet, Take 1 tablet (50 mg total) by mouth daily., Disp: 90 tablet, Rfl: 0 .  olmesartan-hydrochlorothiazide (BENICAR HCT) 40-12.5 MG tablet, Take 1 tablet at bedtime by mouth., Disp: 90 tablet, Rfl: 1 .  spironolactone (ALDACTONE) 50 MG tablet, Take 1 tablet (50 mg total) by mouth 2 (two) times daily., Disp: 180 tablet, Rfl: 0 .  ELIDEL 1 % cream, 1 APPLICATION APPLY ON THE SKIN DAILY, Disp: , Rfl: 3 .   multivitamin-iron-minerals-folic acid (CENTRUM) chewable tablet, Chew 1 tablet by mouth daily.  , Disp: , Rfl:   Allergies  Allergen Reactions  . Ace Inhibitors Cough  . Latex Itching    Gloves, condoms     Review of Systems  HENT: Positive for congestion (nasal congestion. ) and sinus pressure. Negative for sore throat.   Neurological: Positive for headaches.     Objective  Vitals:   02/01/18 1052  BP: 138/86  Pulse: 80  Resp: 14  Temp: 98.2 F (36.8 C)  TempSrc: Oral  SpO2: 94%  Weight:  219 lb 3.2 oz (99.4 kg)    Physical Exam  Constitutional: She is well-developed, well-nourished, and in no distress.  HENT:  Head: Normocephalic and atraumatic.  Right Ear: Tympanic membrane and ear canal normal.  Left Ear: Tympanic membrane and ear canal normal.  Nose: Right sinus exhibits no maxillary sinus tenderness and no frontal sinus tenderness. Left sinus exhibits no maxillary sinus tenderness and no frontal sinus tenderness.  Mouth/Throat: No posterior oropharyngeal erythema.  Nasal turbinate hypertrophy  Cardiovascular: Normal rate, regular rhythm, S1 normal, S2 normal and normal heart sounds.  No murmur heard. Pulmonary/Chest: Effort normal and breath sounds normal. She has no wheezes. She has no rhonchi.  Musculoskeletal:       Left elbow: Tenderness found. Lateral epicondyle tenderness noted. No medial epicondyle tenderness noted.       Left forearm: Normal.  Nursing note and vitals reviewed.       Assessment & Plan  1. ADHD (attention deficit hyperactivity disorder), inattentive type Symptoms of attention deficit disorder are in remission on methylphenidate, refills for 3 months provided - methylphenidate (CONCERTA) 18 MG PO CR tablet; Take 1 tablet (18 mg total) by mouth daily.  Dispense: 30 tablet; Refill: 0 - methylphenidate 18 MG PO CR tablet; Take 1 tablet (18 mg total) by mouth daily.  Dispense: 30 tablet; Refill: 0 - methylphenidate (CONCERTA) 18 MG PO  CR tablet; Take 1 tablet (18 mg total) by mouth daily.  Dispense: 30 tablet; Refill: 0  2. Hot flashes due to menopause  - escitalopram (LEXAPRO) 20 MG tablet; Take 1 tablet (20 mg total) by mouth daily.  Dispense: 30 tablet; Refill: 2  3. Hypertension due to endocrine disorder BP stable on present antihypertensive treatment  4. Left elbow pain Reassured, unlikely to be an elbow fracture, should resolve in 1-2 weeks  5. Chronic recurrent sinusitis  - mometasone (NASONEX) 50 MCG/ACT nasal spray; Place 2 sprays into the nose daily.  Dispense: 17 g; Refill: 0  Nat Lowenthal [facility] 02/01/2018 11:00 AM

## 2018-02-22 ENCOUNTER — Ambulatory Visit: Payer: Medicaid Other

## 2018-02-22 ENCOUNTER — Other Ambulatory Visit (HOSPITAL_COMMUNITY)
Admission: RE | Admit: 2018-02-22 | Discharge: 2018-02-22 | Disposition: A | Discharge status: Home / Self Care | Payer: Medicaid Other | Source: Ambulatory Visit | Attending: Obstetrics & Gynecology | Admitting: Obstetrics & Gynecology

## 2018-02-22 VITALS — BP 123/70 | HR 72

## 2018-02-22 DIAGNOSIS — N898 Other specified noninflammatory disorders of vagina: Secondary | ICD-10-CM | POA: Diagnosis not present

## 2018-02-22 NOTE — Progress Notes (Signed)
SUBJECTIVE:  42 y.o. female complains of pe vag discharge that started a couple of days ago. Denies abnormal vaginal bleeding or significant pelvic pain or fever. No UTI symptoms. Denies history of known exposure to STD.  No LMP recorded. Patient has had a hysterectomy.  OBJECTIVE:  She appears well, afebrile. Urine dipstick: not checked   ASSESSMENT:  Vaginal Discharge  Vaginal Odor   PLAN:  BVAG, CVAG probe sent to lab. Treatment: Patient would like to go ahead and be treated for BV she is positive she has it and feels she can not wait until results come back. Patient inform we will call her in some flagyl and once results come back she will be contacted. Specimen labeled and sent to the pharmacy.

## 2018-02-25 LAB — CERVICOVAGINAL ANCILLARY ONLY
Bacterial vaginitis: POSITIVE — AB
CANDIDA VAGINITIS: NEGATIVE
TRICH (WINDOWPATH): NEGATIVE

## 2018-02-28 ENCOUNTER — Other Ambulatory Visit: Payer: Self-pay

## 2018-02-28 DIAGNOSIS — N951 Menopausal and female climacteric states: Secondary | ICD-10-CM

## 2018-02-28 MED ORDER — ESCITALOPRAM OXALATE 20 MG PO TABS: 20.0000 mg | ORAL_TABLET | Freq: Every day | ORAL | 0 refills | Status: DC

## 2018-02-28 NOTE — Telephone Encounter (Signed)
Refill request for general medication. [facility] instead of [facility] 30 day supply.   Last office visit: 02/01/2018   Follow up 05/01/2018

## 2018-03-05 ENCOUNTER — Other Ambulatory Visit: Payer: Self-pay

## 2018-03-05 DIAGNOSIS — F900 Attention-deficit hyperactivity disorder, predominantly inattentive type: Secondary | ICD-10-CM

## 2018-03-05 MED ORDER — CLONIDINE HCL 0.1 MG PO TABS: 0.1000 mg | ORAL_TABLET | Freq: Every day | ORAL | 0 refills | Status: DC

## 2018-03-05 NOTE — Telephone Encounter (Signed)
Refill request for general medication. [facility]  Last office visit:  02/01/2018   Follow up 05/01/2018

## 2018-03-29 ENCOUNTER — Ambulatory Visit: Payer: Medicaid Other | Admitting: Nurse Practitioner

## 2018-04-02 ENCOUNTER — Telehealth: Payer: Self-pay | Admitting: Family Medicine

## 2018-04-02 NOTE — Telephone Encounter (Signed)
Note from pharmacy that they will not pay for Benicar-HCT She no showed for her appt with Amanda Fields on April 12th Creatinine is not ideal for her age Recommend appt to discuss BP and change medicine with either Amanda Fields or her primary, Dr. Ancil Boozer In the meantime, find out how much Benicar-HCT she has left

## 2018-04-02 NOTE — Telephone Encounter (Signed)
Left a message for patient to call back and reschedule an appointment since she was a no show on April 12. Also how many pills does she have left of the Benicar/HCTZ?

## 2018-04-03 NOTE — Telephone Encounter (Signed)
Can she come in tomorrow?

## 2018-04-04 ENCOUNTER — Other Ambulatory Visit: Payer: Self-pay | Admitting: Family Medicine

## 2018-04-04 NOTE — Telephone Encounter (Signed)
LVM for pt to schedule an appt today with Dr Ancil Boozer

## 2018-04-05 ENCOUNTER — Other Ambulatory Visit: Payer: Self-pay | Admitting: Family Medicine

## 2018-04-05 DIAGNOSIS — F900 Attention-deficit hyperactivity disorder, predominantly inattentive type: Secondary | ICD-10-CM

## 2018-04-10 ENCOUNTER — Encounter: Payer: Self-pay | Admitting: Family Medicine

## 2018-04-10 ENCOUNTER — Ambulatory Visit: Payer: Medicaid Other | Admitting: Family Medicine

## 2018-04-10 VITALS — BP 120/90 | HR 68 | Resp 14 | Ht 66.0 in | Wt 219.2 lb

## 2018-04-10 DIAGNOSIS — F900 Attention-deficit hyperactivity disorder, predominantly inattentive type: Secondary | ICD-10-CM | POA: Diagnosis not present

## 2018-04-10 DIAGNOSIS — I152 Hypertension secondary to endocrine disorders: Secondary | ICD-10-CM

## 2018-04-10 DIAGNOSIS — F330 Major depressive disorder, recurrent, mild: Secondary | ICD-10-CM | POA: Diagnosis not present

## 2018-04-10 DIAGNOSIS — G8929 Other chronic pain: Secondary | ICD-10-CM

## 2018-04-10 DIAGNOSIS — M7711 Lateral epicondylitis, right elbow: Secondary | ICD-10-CM

## 2018-04-10 DIAGNOSIS — M25561 Pain in right knee: Secondary | ICD-10-CM | POA: Diagnosis not present

## 2018-04-10 DIAGNOSIS — N951 Menopausal and female climacteric states: Secondary | ICD-10-CM | POA: Diagnosis not present

## 2018-04-10 DIAGNOSIS — Z8742 Personal history of other diseases of the female genital tract: Secondary | ICD-10-CM | POA: Insufficient documentation

## 2018-04-10 DIAGNOSIS — E669 Obesity, unspecified: Secondary | ICD-10-CM | POA: Insufficient documentation

## 2018-04-10 DIAGNOSIS — F411 Generalized anxiety disorder: Secondary | ICD-10-CM | POA: Diagnosis not present

## 2018-04-10 MED ORDER — MELOXICAM 15 MG PO TABS: 15.0000 mg | ORAL_TABLET | Freq: Every day | ORAL | 0 refills | Status: DC

## 2018-04-10 MED ORDER — OLMESARTAN MEDOXOMIL-HCTZ 40-25 MG PO TABS: 1.0000 | ORAL_TABLET | Freq: Every day | ORAL | 2 refills | Status: DC

## 2018-04-10 MED ORDER — METHYLPHENIDATE HCL ER (OSM) 27 MG PO TBCR: 27.0000 mg | EXTENDED_RELEASE_TABLET | Freq: Every day | ORAL | 0 refills | Status: DC

## 2018-04-10 MED ORDER — ESCITALOPRAM OXALATE 20 MG PO TABS: 20.0000 mg | ORAL_TABLET | Freq: Every day | ORAL | 2 refills | Status: DC

## 2018-04-10 NOTE — Progress Notes (Signed)
Name: Amanda Fields   MRN: [id number]    DOB: 1976/11/08   Date:04/10/2018       Progress Note  Subjective  Chief Complaint  Chief Complaint  Patient presents with  . Knee Pain    right knee pain when she walks or climbs stairs with popping on and off.  . Numbness    in her right arm  . ADHD  . Hypertension    HPI  Right knee pain: going on for over one year, states it is anterior dull pain, worse going up or down stairs and has an urge to stretch leg when sitting for a prolonged period of time, no swelling, redness or increase in warmth, but has a click sensation at times  Right arm problem: she states that for months she has noticed pain on right lateral epicondyle and at night has tingling on arm, and needs to shake it at times. She is right hand dominant.   ADHD: always had symptoms, but diagnosed as an adult by previous PCP, ADD questionnaire completed today. She feels better with concerta but dose was not titrated up, we will increase drom 18 mg to 27 mg and recheck next visit.   GAD and Mild Major Depression: she is taking Lexapro and seems to help with depression but still anxious versus uncontrolled ADD, we will continue Lexapro and increase dose of Concerta, advised her to contact me if symptoms gets worse. We will also have to monitor bp with higher dose of stimulant.   HTN: DBP elevated, she is s/p adrenal gland surgery but still on multiple bp medications. She states usually bp is at goal, but she has an interview at 73 in [place] and is in a hurry today. Denies chest pain, she has lower extremity edema ( left worse than right - she will return to discuss that on her next visit)   Morbid obesity: based on BMI plus other risk factors ( knee pain/HTN), decrease portion size and hopefully once knee pain improves she can increase physical activity   Patient Active Problem List   Diagnosis Date Noted  . History of endometriosis 04/10/2018  . GAD (generalized anxiety disorder)  04/10/2018  . Mild recurrent major depression ([place]) 04/10/2018  . Morbid obesity, unspecified obesity type ([place]) 04/10/2018  . ADHD (attention deficit hyperactivity disorder), 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  . Palpitation 07/12/2017  . Family history of malignant neoplasm of gastrointestinal tract   . 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 Allergic rhinitis 02/04/2016  . Proteinuria 01/04/2016  . Leukocytosis 01/04/2016  . Elevated total protein 01/04/2016  . Pelvic pain in female 07/10/2012  . Bilateral leg edema 04/09/2012  . 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 Hypertension     Past Surgical History:  Procedure Laterality Date  . COLONOSCOPY WITH PROPOFOL N/A 04/09/2017   Procedure: COLONOSCOPY WITH PROPOFOL;  Surgeon: Lucilla Lame, MD;  Location: [facility];  Service: Endoscopy;  Laterality: N/A;  LATEX sensitivity  . IR GENERIC HISTORICAL  07/07/2016   IR VENOGRAM RENAL UNI LEFT 07/07/2016 Aletta Edouard, MD [facility]  . IR GENERIC HISTORICAL  07/07/2016   IR VENOUS SAMPLING 07/07/2016 Aletta Edouard, MD [facility]  . IR GENERIC HISTORICAL  07/07/2016   IR VENOGRAM ADRENAL BI 07/07/2016 Aletta Edouard, MD [facility]  . IR GENERIC HISTORICAL  07/07/2016   IR US GUIDE VASC ACCESS RIGHT 07/07/2016 Aletta Edouard, MD [facility]  . IR GENERIC HISTORICAL  07/07/2016   IR ANGIOGRAM SELECTIVE EACH ADDITIONAL VESSEL 07/07/2016 [facility] Post  Kathlene Cote, MD [facility]  . IR GENERIC HISTORICAL  07/07/2016   IR VENOUS SAMPLING 07/07/2016 Aletta Edouard, MD [facility]  . IR GENERIC HISTORICAL  07/07/2016   IR US GUIDE VASC ACCESS RIGHT 07/07/2016 Aletta Edouard, MD [facility]  . LAPAROSCOPIC SUPRACERVICAL HYSTERECTOMY  09/27/2010   For endometriosis. Ovaries anf tubes are in place.  Marland Kitchen NASAL SEPTUM SURGERY    . ROBOTIC ADRENALECTOMY Left 10/27/2016   Procedure: XI ROBOTIC LEFT ADRENALECTOMY;  Surgeon: Michael Boston, MD;  Location: [facility];  Service: General;  Laterality: Left;  . TONSILLECTOMY    .  TUBAL LIGATION  2006  . WISDOM TOOTH EXTRACTION      Family History  Problem Relation Age of Onset  . COPD Mother   . Hypotension Mother   . Colon cancer Father   . Emphysema Father   . Cancer Father        colon  . Hyperlipidemia Sister   . Heart failure Paternal Uncle   . Heart attack Cousin 32  . Heart disease Cousin        heart attack  . Breast cancer Maternal Aunt   . Breast cancer Other   . Adrenal disorder Neg Hx     Social History   Socioeconomic History  . Marital status: Single    Spouse name: Not on file  . Number of children: 2  . Years of education: Not on file  . Highest education level: Not on file  Occupational History  . Occupation: call center  Social Needs  . Financial resource strain: Not on file  . Food insecurity:    Worry: Not on file    Inability: Not on file  . Transportation needs:    Medical: Not on file    Non-medical: Not on file  Tobacco Use  . Smoking status: Never Smoker  . Smokeless tobacco: Never Used  Substance and Sexual Activity  . Alcohol use: Yes    Comment: social (1 drink/mo)  . Drug use: No  . Sexual activity: Yes    Partners: Male    Birth control/protection: Surgical  Lifestyle  . Physical activity:    Days per week: Not on file    Minutes per session: Not on file  . Stress: Not on file  Relationships  . Social connections:    Talks on phone: Not on file    Gets together: Not on file    Attends religious service: Not on file    Active member of club or organization: Not on file    Attends meetings of clubs or organizations: Not on file    Relationship status: Not on file  . Intimate partner violence:    Fear of current or ex partner: Not on file    Emotionally abused: Not on file    Physically abused: Not on file    Forced sexual activity: Not on file  Other Topics Concern  . Not on file  Social History Narrative  . Not on file     Current Outpatient Medications:  .  calcium-vitamin D 250-100  MG-UNIT tablet, Take 1 tablet by mouth 2 (two) times daily., Disp: 60 tablet, Rfl: 3 .  cloNIDine (CATAPRES) 0.1 MG tablet, TAKE 1 TABLET BY MOUTH EVERY DAY, Disp: 30 tablet, Rfl: 0 .  ELIDEL 1 % cream, 1 APPLICATION APPLY ON THE SKIN DAILY, Disp: , Rfl: 3 .  escitalopram (LEXAPRO) 20 MG tablet, Take 1 tablet (20 mg total) by mouth daily., Disp: 30  tablet, Rfl: 2 .  levocetirizine (XYZAL) 5 MG tablet, Take 5 mg by mouth daily as needed for allergies. , Disp: , Rfl:  .  meloxicam (MOBIC) 15 MG tablet, Take 1 tablet (15 mg total) by mouth daily., Disp: 30 tablet, Rfl: 0 .  methylphenidate (CONCERTA) 18 MG PO CR tablet, Take 1 tablet (18 mg total) by mouth daily., Disp: 30 tablet, Rfl: 0 .  methylphenidate (CONCERTA) 18 MG PO CR tablet, Take 1 tablet (18 mg total) by mouth daily., Disp: 30 tablet, Rfl: 0 .  methylphenidate 27 MG PO CR tablet, Take 1 tablet (27 mg total) by mouth daily., Disp: 30 tablet, Rfl: 0 .  metoprolol succinate (TOPROL-XL) 50 MG 24 hr tablet, Take 1 tablet (50 mg total) by mouth daily., Disp: 90 tablet, Rfl: 0 .  mometasone (NASONEX) 50 MCG/ACT nasal spray, Place 2 sprays into the nose daily. (Patient not taking: Reported on 04/10/2018), Disp: 17 g, Rfl: 0 .  multivitamin-iron-minerals-folic acid (CENTRUM) chewable tablet, Chew 1 tablet by mouth daily.  , Disp: , Rfl:  .  olmesartan-hydrochlorothiazide (BENICAR HCT) 40-25 MG tablet, Take 1 tablet by mouth daily., Disp: 30 tablet, Rfl: 2 .  spironolactone (ALDACTONE) 50 MG tablet, Take 1 tablet (50 mg total) by mouth 2 (two) times daily., Disp: 180 tablet, Rfl: 0  Allergies  Allergen Reactions  . Ace Inhibitors Cough  . Latex Itching    Gloves, condoms     ROS  Ten systems reviewed and is negative except as mentioned in HPI   Objective  Vitals:   04/10/18 0843  BP: 120/90  Pulse: 68  Resp: 14  SpO2: 98%  Weight: 219 lb 3.2 oz (99.4 kg)  Height: 5' 6"  (1.676 m)    Body mass index is 35.38 kg/m.  Physical  Exam  Constitutional: Patient appears well-developed and well-nourished. Obese No distress.  HEENT: head atraumatic, normocephalic, pupils equal and reactive to light,neck supple, throat within normal limits Cardiovascular: Normal rate, regular rhythm and normal heart sounds.  No murmur heard. No BLE edema. Pulmonary/Chest: Effort normal and breath sounds normal. No respiratory distress. Abdominal: Soft.  There is no tenderness. Psychiatric: Patient has a normal mood and affect. behavior is normal. Judgment and thought content normal. Fidgety, moving legs constantly Muscular Skeletal: pain during palpation of right lateral epicondyle, normal grip, normal rom of right knee, no effusion, increase in warmth or redness.    Recent Results (from the past 2160 hour(s))  Cervicovaginal ancillary only     Status: Abnormal   Collection Time: 02/22/18 12:00 AM  Result Value Ref Range   Bacterial vaginitis **POSITIVE for Gardnerella vaginalis** (A)     Comment: Normal Reference Range - Negative   Candida vaginitis Negative for Candida species     Comment: Normal Reference Range - Negative   Trichomonas Negative     Comment: Normal Reference Range - Negative     PHQ2/9: Depression screen [facility] 2/9 04/10/2018 04/10/2018 02/01/2018 11/22/2017 10/29/2017  Decreased Interest - 3 0 1 0  Down, Depressed, Hopeless - 0 1 1 1   PHQ - 2 Score - 3 1 2 1   Altered sleeping - 3 - 1 1  Tired, decreased energy - 0 - 1 1  Change in appetite - 2 - 0 0  Feeling bad or failure about yourself  - 0 - 0 0  Trouble concentrating - 3 - 0 0  Moving slowly or fidgety/restless - 0 - 0 0  Suicidal thoughts - - - 0  0  PHQ-9 Score - 11 - 4 3  Difficult doing work/chores Not difficult at all Not difficult at all - Not difficult at all Not difficult at all    Fall Risk: Fall Risk  02/01/2018 11/22/2017 10/29/2017 10/23/2017 08/21/2017  Falls in the past year? No No No No No  Number falls in past yr: - - - - -  Injury with Fall? -  - - - -  Follow up - - - - -     Functional Status Survey: Is the patient deaf or have difficulty hearing?: No Does the patient have difficulty seeing, even when wearing glasses/contacts?: No Does the patient have difficulty concentrating, remembering, or making decisions?: No Does the patient have difficulty walking or climbing stairs?: No Does the patient have difficulty dressing or bathing?: No Does the patient have difficulty doing errands alone such as visiting a doctor's office or shopping?: No    Assessment & Plan  1. Mild recurrent major depression (HCC)  - escitalopram (LEXAPRO) 20 MG tablet; Take 1 tablet (20 mg total) by mouth daily.  Dispense: 30 tablet; Refill: 2  2. History of endometriosis   3. GAD (generalized anxiety disorder)  - escitalopram (LEXAPRO) 20 MG tablet; Take 1 tablet (20 mg total) by mouth daily.  Dispense: 30 tablet; Refill: 2  4. Hypertension due to endocrine disorder  - olmesartan-hydrochlorothiazide (BENICAR HCT) 40-25 MG tablet; Take 1 tablet by mouth daily.  Dispense: 30 tablet; Refill: 2  5. Hot flashes due to menopause  - escitalopram (LEXAPRO) 20 MG tablet; Take 1 tablet (20 mg total) by mouth daily.  Dispense: 30 tablet; Refill: 2  6. Morbid obesity, unspecified obesity type [facility])  Discussed with the patient the risk posed by an increased BMI. Discussed importance of portion control, calorie counting and at least 150 minutes of physical activity weekly. Avoid sweet beverages and drink more water. Eat at least 6 servings of fruit and vegetables daily   7. 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  Dispense: 30 tablet; Refill: 0  8. Right lateral epicondylitis  - meloxicam (MOBIC) 15 MG tablet; Take 1 tablet (15 mg total) by mouth daily.  Dispense: 30 tablet; Refill: 0  9.ADHD (attention  deficit hyperactivity disorder), inattentive type  We will increase dose and recheck next visit, symptoms not currently controlled - methylphenidate 27 MG PO CR tablet; Take 1 tablet (27 mg total) by mouth daily.  Dispense: 30 tablet; Refill: 0 rx given by Dr. Manuella Ghazi not valid because his license no longer valid in [facility]

## 2018-04-10 NOTE — Patient Instructions (Signed)
Patellofemoral Pain Syndrome Patellofemoral pain syndrome is a condition that involves a softening or breakdown of the tissue (cartilage) on the underside of your kneecap (patella). This causes pain in the front of the knee. The condition is also called runner's knee or chondromalacia patella. Patellofemoral pain syndrome is most common in young adults who are active in sports. Your knee is the largest joint in your body. The patella covers the front of your knee and is attached to muscles above and below your knee. The underside of the patella is covered with a smooth type of cartilage (synovium). The smooth surface helps the patella glide easily when you move your knee. Patellofemoral pain syndrome causes swelling in the joint linings and bone surfaces in your knee. What are the causes? Patellofemoral pain syndrome can be caused by:  Overuse.  Poor alignment of your knee joints.  Weak leg muscles.  A direct blow to your kneecap.  What increases the risk? You may be at risk for patellofemoral pain syndrome if you:  Do a lot of activities that can wear down your kneecap. These include: ? Running. ? Squatting. ? Climbing stairs.  Start a new physical activity or exercise program.  Wear shoes that do not fit well.  Do not have good leg strength.  Are overweight.  What are the signs or symptoms? Knee pain is the most common symptom of patellofemoral pain syndrome. This may feel like a dull, aching pain underneath your patella, in the front of your knee. There may be a popping or cracking sound when you move your knee. Pain may get worse with:  Exercise.  Climbing stairs.  Running.  Jumping.  Squatting.  Kneeling.  Sitting for a long time.  Moving or pushing on your patella.  How is this diagnosed? Your health care provider may be able to diagnose patellofemoral pain syndrome from your symptoms and medical history. You may be asked about your recent physical activities  and which ones cause knee pain. Your health care provider may do a physical exam with certain tests to confirm the diagnosis. These may include:  Moving your patella back and forth.  Checking your range of knee motion.  Having you squat or jump to see if you have pain.  Checking the strength of your leg muscles.  An MRI of the knee may also be done. How is this treated? Patellofemoral pain syndrome can usually be treated at home with rest, ice, compression, and elevation (RICE). Other treatments may include:  Nonsteroidal anti-inflammatory drugs (NSAIDs).  Physical therapy to stretch and strengthen your leg muscles.  Shoe inserts (orthotics) to take stress off your knee.  A knee brace or knee support.  Surgery to remove damaged cartilage or move the patella to a better position. The need for surgery is rare.  Follow these instructions at home:  Take medicines only as directed by your health care provider.  Rest your knee. ? When resting, keep your knee raised above the level of your heart. ? Avoid activities that cause knee pain.  Apply ice to the injured area: ? Put ice in a plastic bag. ? Place a towel between your skin and the bag. ? Leave the ice on for 20 minutes, 2-3 times a day.  Use splints, braces, knee supports, or walking aids as directed by your health care provider.  Perform stretching and strengthening exercises as directed by your health care provider or physical therapist.  Keep all follow-up visits as directed by your health care  provider. This is important. Contact a health care provider if:  Your symptoms get worse.  You are not improving with home care. This information is not intended to replace advice given to you by your health care provider. Make sure you discuss any questions you have with your health care provider. Document Released: 11/22/2009 Document Revised: 05/11/2016 Document Reviewed: 02/23/2014 Elsevier Interactive Patient Education   2018 [employer].

## 2018-04-16 ENCOUNTER — Telehealth: Payer: Self-pay | Admitting: Family Medicine

## 2018-04-16 DIAGNOSIS — Z23 Encounter for immunization: Secondary | ICD-10-CM | POA: Diagnosis not present

## 2018-04-16 DIAGNOSIS — Z111 Encounter for screening for respiratory tuberculosis: Secondary | ICD-10-CM | POA: Diagnosis not present

## 2018-04-16 DIAGNOSIS — Z0184 Encounter for antibody response examination: Secondary | ICD-10-CM | POA: Diagnosis not present

## 2018-04-16 NOTE — Telephone Encounter (Signed)
Copied from [place] [id number]. Topic: Quick Communication - See Telephone Encounter >> Apr 16, 2018  2:32 PM Amanda Fields wrote: CRM for notification. See Telephone encounter for: 04/16/18.  Patient wants to know if she can come in tomorrow and get the varicella shot. Call back @ [phone]

## 2018-04-16 NOTE — Telephone Encounter (Signed)
Also wanted to inform you Medicaid will not approve the combination Benicar but if split in two medications should be covered due to them being on the preferred list.

## 2018-04-24 ENCOUNTER — Ambulatory Visit: Payer: Managed Care, Other (non HMO)

## 2018-05-01 ENCOUNTER — Ambulatory Visit: Payer: Medicaid Other | Attending: Family Medicine

## 2018-05-01 ENCOUNTER — Ambulatory Visit: Payer: Self-pay | Admitting: Family Medicine

## 2018-05-03 ENCOUNTER — Other Ambulatory Visit: Payer: Self-pay | Admitting: Family Medicine

## 2018-05-03 DIAGNOSIS — F900 Attention-deficit hyperactivity disorder, predominantly inattentive type: Secondary | ICD-10-CM

## 2018-05-03 NOTE — Telephone Encounter (Signed)
Refill request for general medication. Clonidine to [facility]  Last office visit: 04/10/18   No follow-ups on file.

## 2018-05-07 ENCOUNTER — Other Ambulatory Visit: Payer: Self-pay | Admitting: Family Medicine

## 2018-05-07 DIAGNOSIS — M7711 Lateral epicondylitis, right elbow: Secondary | ICD-10-CM

## 2018-05-07 DIAGNOSIS — M25561 Pain in right knee: Secondary | ICD-10-CM

## 2018-05-07 DIAGNOSIS — F900 Attention-deficit hyperactivity disorder, predominantly inattentive type: Secondary | ICD-10-CM

## 2018-05-07 DIAGNOSIS — G8929 Other chronic pain: Secondary | ICD-10-CM

## 2018-05-07 NOTE — Telephone Encounter (Signed)
Refill request for general medication: Meloxicam 15 mg  Clonidine 0.0 mg  Last office visit: 04/10/2018  Last physical exam: None indicated  Follow-ups on file. None indicated

## 2018-05-08 NOTE — Telephone Encounter (Signed)
Patient needs appointment in order to obtain further refills on her Meloxicam. She declined the refill on the Clonidine. Not sure why she just said refill not appropriate.

## 2018-05-08 NOTE — Telephone Encounter (Signed)
LVM for pt to call and schedule an appt

## 2018-05-15 ENCOUNTER — Ambulatory Visit: Payer: Medicaid Other

## 2018-05-27 ENCOUNTER — Other Ambulatory Visit: Payer: Self-pay | Admitting: Family Medicine

## 2018-05-27 DIAGNOSIS — F900 Attention-deficit hyperactivity disorder, predominantly inattentive type: Secondary | ICD-10-CM

## 2018-05-27 NOTE — Telephone Encounter (Signed)
Refill request for general medication. Clonidine [facility]  Last office visit 04/10/18  No follow-ups on file.

## 2018-05-31 ENCOUNTER — Other Ambulatory Visit: Payer: Self-pay | Admitting: Family Medicine

## 2018-05-31 DIAGNOSIS — F900 Attention-deficit hyperactivity disorder, predominantly inattentive type: Secondary | ICD-10-CM

## 2018-06-12 ENCOUNTER — Telehealth: Payer: Self-pay | Admitting: Family Medicine

## 2018-06-12 DIAGNOSIS — F900 Attention-deficit hyperactivity disorder, predominantly inattentive type: Secondary | ICD-10-CM

## 2018-06-21 ENCOUNTER — Other Ambulatory Visit: Payer: Self-pay

## 2018-06-21 DIAGNOSIS — R002 Palpitations: Secondary | ICD-10-CM

## 2018-06-21 NOTE — Telephone Encounter (Signed)
Refill request for Hypertension medication:  Metoprolol SUC ER 50 mg  Last office visit pertaining to hypertension: 04/10/2018  BP Readings from Last 3 Encounters:  04/10/18 120/90  02/22/18 123/70  02/01/18 138/86     Lab Results  Component Value Date   CREATININE 1.23 (H) 10/29/2017   BUN 11 10/29/2017   NA 143 10/29/2017   K 4.7 10/29/2017   CL 104 10/29/2017   CO2 24 10/29/2017    Follow-ups on file. None indicated

## 2018-06-23 MED ORDER — METOPROLOL SUCCINATE ER 50 MG PO TB24: 50.0000 mg | ORAL_TABLET | Freq: Every day | ORAL | 0 refills | Status: DC

## 2018-06-25 ENCOUNTER — Ambulatory Visit (INDEPENDENT_AMBULATORY_CARE_PROVIDER_SITE_OTHER): Payer: Medicaid Other

## 2018-06-25 ENCOUNTER — Other Ambulatory Visit: Payer: Self-pay | Admitting: Family Medicine

## 2018-06-25 VITALS — BP 160/108 | HR 78

## 2018-06-25 DIAGNOSIS — F900 Attention-deficit hyperactivity disorder, predominantly inattentive type: Secondary | ICD-10-CM

## 2018-06-25 DIAGNOSIS — Z23 Encounter for immunization: Secondary | ICD-10-CM | POA: Diagnosis not present

## 2018-06-25 DIAGNOSIS — R002 Palpitations: Secondary | ICD-10-CM

## 2018-06-25 NOTE — Telephone Encounter (Signed)
Copied from [id number]. Topic: Quick Communication - Rx Refill/Question >> Jun 25, 2018  9:48 AM Alfredia Ferguson R wrote: Medication: spironolactone (ALDACTONE) 50 MG tablet , metoprolol succinate (TOPROL-XL) 50 MG 24 hr tablet , methylphenidate (CONCERTA) 18 MG PO CR tablet  Has the patient contacted their pharmacy? Yes (Agent: If no, request that the patient contact the pharmacy for the refill.) (Agent: If yes, when and what did the pharmacy advise?)  Preferred Pharmacy (with phone number or street name): [phone](Phone) [phone](Fax)      Agent: Please be advised that RX refills may take up to 3 business days. We ask that you follow-up with your pharmacy.

## 2018-06-25 NOTE — Progress Notes (Signed)
Patient received her gardisil #3  [place] [phone] in her left arm. Patient tolerated well. Patient blood pressure was elevate in the office. She stated she was out of medication and has been trying to get a refill from her pcp. I have advised her to call the office again to let them know what her reading was today. Advised patient is she has any visual changes, headache that will not go away with medication or dizziness she should seek treatment at a urgent care or ER until she can get in with her pcp.

## 2018-06-27 ENCOUNTER — Other Ambulatory Visit: Payer: Self-pay | Admitting: Family Medicine

## 2018-06-27 ENCOUNTER — Encounter: Payer: Self-pay | Admitting: Family Medicine

## 2018-06-27 ENCOUNTER — Ambulatory Visit: Payer: Medicaid Other | Admitting: Family Medicine

## 2018-06-27 VITALS — BP 120/84 | HR 66 | Temp 98.0°F | Resp 16 | Ht 66.0 in | Wt 219.3 lb

## 2018-06-27 DIAGNOSIS — F900 Attention-deficit hyperactivity disorder, predominantly inattentive type: Secondary | ICD-10-CM

## 2018-06-27 DIAGNOSIS — F411 Generalized anxiety disorder: Secondary | ICD-10-CM | POA: Diagnosis not present

## 2018-06-27 DIAGNOSIS — F330 Major depressive disorder, recurrent, mild: Secondary | ICD-10-CM | POA: Diagnosis not present

## 2018-06-27 DIAGNOSIS — I10 Essential (primary) hypertension: Secondary | ICD-10-CM | POA: Diagnosis not present

## 2018-06-27 DIAGNOSIS — N951 Menopausal and female climacteric states: Secondary | ICD-10-CM | POA: Diagnosis not present

## 2018-06-27 DIAGNOSIS — R002 Palpitations: Secondary | ICD-10-CM | POA: Diagnosis not present

## 2018-06-27 MED ORDER — METHYLPHENIDATE HCL ER (OSM) 27 MG PO TBCR: 27.0000 mg | EXTENDED_RELEASE_TABLET | Freq: Every day | ORAL | 0 refills | Status: DC

## 2018-06-27 MED ORDER — CLONIDINE HCL 0.1 MG PO TABS: 0.1000 mg | ORAL_TABLET | Freq: Every day | ORAL | 2 refills | Status: DC

## 2018-06-27 MED ORDER — METHYLPHENIDATE HCL ER (OSM) 27 MG PO TBCR: 27.0000 mg | EXTENDED_RELEASE_TABLET | ORAL | 0 refills | Status: DC

## 2018-06-27 MED ORDER — SPIRONOLACTONE 50 MG PO TABS: 50.0000 mg | ORAL_TABLET | Freq: Two times a day (BID) | ORAL | 2 refills | Status: DC

## 2018-06-27 MED ORDER — VALSARTAN-HYDROCHLOROTHIAZIDE 320-25 MG PO TABS: 1.0000 | ORAL_TABLET | Freq: Every day | ORAL | 2 refills | Status: DC

## 2018-06-27 MED ORDER — OLMESARTAN MEDOXOMIL-HCTZ 40-25 MG PO TABS: 1.0000 | ORAL_TABLET | Freq: Every day | ORAL | 2 refills | Status: DC

## 2018-06-27 MED ORDER — ESCITALOPRAM OXALATE 20 MG PO TABS: 20.0000 mg | ORAL_TABLET | Freq: Every day | ORAL | 2 refills | Status: DC

## 2018-06-27 MED ORDER — METOPROLOL SUCCINATE ER 50 MG PO TB24: 50.0000 mg | ORAL_TABLET | Freq: Every day | ORAL | 2 refills | Status: DC

## 2018-06-27 NOTE — Progress Notes (Signed)
Name: Amanda Fields   MRN: [id number]    DOB: 21-Jan-1976   Date:06/27/2018       Progress Note  Subjective  Chief Complaint  Chief Complaint  Patient presents with  . Follow-up  . Hypertension    Pit Edema in her left leg  . ADHD    Needs Refill  . Depression  . Sinusitis    Onset-2 months, drainage, sinus pressure, bloody mucus. Has been using the Neti-Pot with no help    HPI  Right knee pain: doing better, finished meloxicam 15 mg, aching occasionally only, exercising.   Right arm problem: she states that for months she has noticed pain on right lateral epicondyle and at night has tingling on arm, and needs to shake it at times. She is right hand dominant.   ADHD: always had symptoms, but diagnosed as an adult by previous PCP, ADD questionnaire completed today. She ran out of 27 and is back on 18 mg , she states the 27 mg helps her better and wants to continue on higher dose  GAD and Mild Major Depression: she is taking Lexapro and seems to help with depression but still anxious versus uncontrolled ADD, we will continue Lexapro and concerta, feeling a little better and wants to lose weight. Discussed life style modification   HTN: she ran out of Benicar HCTZ and bp was elevated during visit at GYN for gardasil. She was also on a high salt fast and has stopped doing that since. BP is at goal today, resume benicar hctz and monitor.   Morbid obesity: based on BMI plus other risk factors ( knee pain/HTN), decrease portion size and has been running and walking now. Doing well.     Patient Active Problem List   Diagnosis Date Noted  . History of endometriosis 04/10/2018  . GAD (generalized anxiety disorder) 04/10/2018  . Mild recurrent major depression ([place]) 04/10/2018  . Morbid obesity, unspecified obesity type ([place]) 04/10/2018  . ADHD (attention deficit hyperactivity disorder), 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  . Palpitation 07/12/2017  . Family history of malignant neoplasm of  gastrointestinal tract   . 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 Allergic rhinitis 02/04/2016  . Proteinuria 01/04/2016  . Leukocytosis 01/04/2016  . Elevated total protein 01/04/2016  . Pelvic pain in female 07/10/2012  . Bilateral leg edema 04/09/2012  . 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 Hypertension     Past Surgical History:  Procedure Laterality Date  . COLONOSCOPY WITH PROPOFOL N/A 04/09/2017   Procedure: COLONOSCOPY WITH PROPOFOL;  Surgeon: Lucilla Lame, MD;  Location: [place];  Service: Endoscopy;  Laterality: N/A;  LATEX sensitivity  . IR GENERIC HISTORICAL  07/07/2016   IR VENOGRAM RENAL UNI LEFT 07/07/2016 Aletta Edouard, MD [facility]  . IR GENERIC HISTORICAL  07/07/2016   IR VENOUS SAMPLING 07/07/2016 Aletta Edouard, MD [facility]  . IR GENERIC HISTORICAL  07/07/2016   IR VENOGRAM ADRENAL BI 07/07/2016 Aletta Edouard, MD [facility]  . IR GENERIC HISTORICAL  07/07/2016   IR US GUIDE VASC ACCESS RIGHT 07/07/2016 Aletta Edouard, MD [facility]  . IR GENERIC HISTORICAL  07/07/2016   IR ANGIOGRAM SELECTIVE EACH ADDITIONAL VESSEL 07/07/2016 Aletta Edouard, MD [facility]  . IR GENERIC HISTORICAL  07/07/2016   IR VENOUS SAMPLING 07/07/2016 Aletta Edouard, MD [facility]  . IR GENERIC HISTORICAL  07/07/2016   IR US GUIDE VASC ACCESS RIGHT 07/07/2016 Aletta Edouard, MD [facility]  . LAPAROSCOPIC SUPRACERVICAL HYSTERECTOMY  09/27/2010   For endometriosis. Ovaries  anf tubes are in place.  Marland Kitchen NASAL SEPTUM SURGERY    . ROBOTIC ADRENALECTOMY Left 10/27/2016   Procedure: XI ROBOTIC LEFT ADRENALECTOMY;  Surgeon: Michael Boston, MD;  Location: [facility];  Service: General;  Laterality: Left;  . TONSILLECTOMY    . TUBAL LIGATION  2006  . WISDOM TOOTH EXTRACTION      Family History  Problem Relation Age of Onset  . COPD Mother   . Hypotension Mother   . Colon cancer Father   . Emphysema Father   . Cancer Father        colon  . Hyperlipidemia Sister   . Heart failure Paternal  Uncle   . Heart attack Cousin 32  . Heart disease Cousin        heart attack  . Breast cancer Maternal Aunt   . Breast cancer Other   . Adrenal disorder Neg Hx     Social History   Socioeconomic History  . Marital status: Single    Spouse name: Not on file  . Number of children: 2  . Years of education: Not on file  . Highest education level: Not on file  Occupational History  . Occupation: call center  Social Needs  . Financial resource strain: Not on file  . Food insecurity:    Worry: Not on file    Inability: Not on file  . Transportation needs:    Medical: Not on file    Non-medical: Not on file  Tobacco Use  . Smoking status: Never Smoker  . Smokeless tobacco: Never Used  Substance and Sexual Activity  . Alcohol use: Yes    Comment: social (1 drink/mo)  . Drug use: No  . Sexual activity: Yes    Partners: Male    Birth control/protection: Surgical  Lifestyle  . Physical activity:    Days per week: Not on file    Minutes per session: Not on file  . Stress: Not on file  Relationships  . Social connections:    Talks on phone: Not on file    Gets together: Not on file    Attends religious service: Not on file    Active member of club or organization: Not on file    Attends meetings of clubs or organizations: Not on file    Relationship status: Not on file  . Intimate partner violence:    Fear of current or ex partner: Not on file    Emotionally abused: Not on file    Physically abused: Not on file    Forced sexual activity: Not on file  Other Topics Concern  . Not on file  Social History Narrative  . Not on file     Current Outpatient Medications:  .  calcium-vitamin D 250-100 MG-UNIT tablet, Take 1 tablet by mouth 2 (two) times daily., Disp: 60 tablet, Rfl: 3 .  cloNIDine (CATAPRES) 0.1 MG tablet, TAKE 1 TABLET BY MOUTH EVERY DAY, Disp: 30 tablet, Rfl: 0 .  escitalopram (LEXAPRO) 20 MG tablet, Take 1 tablet (20 mg total) by mouth daily., Disp: 30  tablet, Rfl: 2 .  levocetirizine (XYZAL) 5 MG tablet, Take 5 mg by mouth daily as needed for allergies. , Disp: , Rfl:  .  methylphenidate (CONCERTA) 18 MG PO CR tablet, Take 1 tablet (18 mg total) by mouth daily., Disp: 30 tablet, Rfl: 0 .  methylphenidate (CONCERTA) 18 MG PO CR tablet, Take 1 tablet (18 mg total) by mouth daily., Disp: 30 tablet, Rfl: 0 .  methylphenidate 27 MG PO CR tablet, Take 1 tablet (27 mg total) by mouth daily., Disp: 30 tablet, Rfl: 0 .  metoprolol succinate (TOPROL-XL) 50 MG 24 hr tablet, Take 1 tablet (50 mg total) by mouth daily., Disp: 30 tablet, Rfl: 0 .  spironolactone (ALDACTONE) 50 MG tablet, Take 1 tablet (50 mg total) by mouth 2 (two) times daily., Disp: 180 tablet, Rfl: 0 .  ELIDEL 1 % cream, 1 APPLICATION APPLY ON THE SKIN DAILY, Disp: , Rfl: 3 .  meloxicam (MOBIC) 15 MG tablet, Take 1 tablet (15 mg total) by mouth daily. (Patient not taking: Reported on 06/27/2018), Disp: 30 tablet, Rfl: 0 .  mometasone (NASONEX) 50 MCG/ACT nasal spray, Place 2 sprays into the nose daily. (Patient not taking: Reported on 04/10/2018), Disp: 17 g, Rfl: 0 .  multivitamin-iron-minerals-folic acid (CENTRUM) chewable tablet, Chew 1 tablet by mouth daily.  , Disp: , Rfl:   Allergies  Allergen Reactions  . Ace Inhibitors Cough  . Latex Itching    Gloves, condoms     ROS  Constitutional: Negative for fever or weight change.  Respiratory: Negative for cough and shortness of breath.   Cardiovascular: Negative for chest pain , positive for occasional  palpitations.  Gastrointestinal: Negative for abdominal pain, no bowel changes.  Musculoskeletal: Negative for gait problem or joint swelling.  Skin: Negative for rash.  Neurological: Negative for dizziness or headache.  No other specific complaints in a complete review of systems (except as listed in HPI above).  Objective  Vitals:   06/27/18 0838  BP: 120/84  Pulse: 66  Resp: 16  Temp: 98 F (36.7 C)  TempSrc: Oral   SpO2: 94%  Weight: 219 lb 4.8 oz (99.5 kg)  Height: 5' 6"  (1.676 m)    Body mass index is 35.4 kg/m.  Physical Exam  Constitutional: Patient appears well-developed and well-nourished. Obese No distress.  HEENT: head atraumatic, normocephalic, pupils equal and reactive to light, neck supple, throat within normal limits Cardiovascular: Normal rate, regular rhythm and normal heart sounds.  No murmur heard. No BLE edema. Pulmonary/Chest: Effort normal and breath sounds normal. No respiratory distress. Abdominal: Soft.  There is no tenderness. Psychiatric: Patient has a normal mood and affect. behavior is normal. Judgment and thought content normal.  PHQ2/9: Depression screen [facility] 2/9 06/27/2018 04/10/2018 04/10/2018 02/01/2018 11/22/2017  Decreased Interest 2 - 3 0 1  Down, Depressed, Hopeless 0 - 0 1 1  PHQ - 2 Score 2 - 3 1 2   Altered sleeping 2 - 3 - 1  Tired, decreased energy 0 - 0 - 1  Change in appetite 2 - 2 - 0  Feeling bad or failure about yourself  0 - 0 - 0  Trouble concentrating 1 - 3 - 0  Moving slowly or fidgety/restless 0 - 0 - 0  Suicidal thoughts 0 - - - 0  PHQ-9 Score 7 - 11 - 4  Difficult doing work/chores Somewhat difficult Not difficult at all Not difficult at all - Not difficult at all    Fall Risk: Fall Risk  02/01/2018 11/22/2017 10/29/2017 10/23/2017 08/21/2017  Falls in the past year? No No No No No  Number falls in past yr: - - - - -  Injury with Fall? - - - - -  Follow up - - - - -     Assessment & Plan  1. Palpitation  - metoprolol succinate (TOPROL-XL) 50 MG 24 hr tablet; Take 1 tablet (50 mg total) by mouth  daily.  Dispense: 30 tablet; Refill: 2  2. ADHD (attention deficit hyperactivity disorder), inattentive type  - methylphenidate 27 MG PO CR tablet; Take 1 tablet (27 mg total) by mouth daily.  Dispense: 30 tablet; Refill: 0 - cloNIDine (CATAPRES) 0.1 MG tablet; Take 1 tablet (0.1 mg total) by mouth daily.  Dispense: 30 tablet; Refill: 2 -  methylphenidate (CONCERTA) 27 MG PO CR tablet; Take 1 tablet (27 mg total) by mouth every morning.  Dispense: 30 tablet; Refill: 0 - methylphenidate (CONCERTA) 27 MG PO CR tablet; Take 1 tablet (27 mg total) by mouth every morning.  Dispense: 30 tablet; Refill: 0  3. Hot flashes due to menopause  - escitalopram (LEXAPRO) 20 MG tablet; Take 1 tablet (20 mg total) by mouth daily.  Dispense: 30 tablet; Refill: 2  4. Mild recurrent major depression ([facility])  - escitalopram (LEXAPRO) 20 MG tablet; Take 1 tablet (20 mg total) by mouth daily.  Dispense: 30 tablet; Refill: 2 She does not want to change medication at this time  5. GAD (generalized anxiety disorder)  - escitalopram (LEXAPRO) 20 MG tablet; Take 1 tablet (20 mg total) by mouth daily.  Dispense: 30 tablet; Refill: 2  6. HTN (hypertension), benign  - metoprolol succinate (TOPROL-XL) 50 MG 24 hr tablet; Take 1 tablet (50 mg total) by mouth daily.  Dispense: 30 tablet; Refill: 2 - spironolactone (ALDACTONE) 50 MG tablet; Take 1 tablet (50 mg total) by mouth 2 (two) times daily.  Dispense: 30 tablet; Refill: 2 - olmesartan-hydrochlorothiazide (BENICAR HCT) 40-25 MG tablet; Take 1 tablet by mouth daily.  Dispense: 30 tablet; Refill: 2

## 2018-06-28 ENCOUNTER — Other Ambulatory Visit: Payer: Self-pay | Admitting: Family Medicine

## 2018-06-28 ENCOUNTER — Encounter: Payer: Self-pay | Admitting: Family Medicine

## 2018-07-16 ENCOUNTER — Encounter: Payer: Self-pay | Admitting: Family Medicine

## 2018-07-16 DIAGNOSIS — Z23 Encounter for immunization: Secondary | ICD-10-CM | POA: Diagnosis not present

## 2018-07-23 ENCOUNTER — Other Ambulatory Visit (HOSPITAL_COMMUNITY)
Admission: RE | Admit: 2018-07-23 | Discharge: 2018-07-23 | Disposition: A | Discharge status: Home / Self Care | Payer: Medicaid Other | Source: Ambulatory Visit | Attending: Family Medicine | Admitting: Family Medicine

## 2018-07-23 ENCOUNTER — Encounter: Payer: Self-pay | Admitting: Family Medicine

## 2018-07-23 ENCOUNTER — Ambulatory Visit (INDEPENDENT_AMBULATORY_CARE_PROVIDER_SITE_OTHER): Payer: Medicaid Other | Admitting: Family Medicine

## 2018-07-23 VITALS — BP 100/67 | HR 83 | Resp 16 | Ht 66.75 in | Wt 221.4 lb

## 2018-07-23 DIAGNOSIS — J0101 Acute recurrent maxillary sinusitis: Secondary | ICD-10-CM | POA: Diagnosis not present

## 2018-07-23 DIAGNOSIS — N898 Other specified noninflammatory disorders of vagina: Secondary | ICD-10-CM

## 2018-07-23 DIAGNOSIS — Z113 Encounter for screening for infections with a predominantly sexual mode of transmission: Secondary | ICD-10-CM

## 2018-07-23 MED ORDER — AMOXICILLIN 500 MG PO CAPS: 500.0000 mg | ORAL_CAPSULE | Freq: Three times a day (TID) | ORAL | 0 refills | Status: DC

## 2018-07-23 NOTE — Progress Notes (Signed)
   Subjective:    Patient ID: Amanda Fields is a 42 y.o. female presenting with Vaginal Discharge (grayish white thick; odor x 3 weeks) and Abdominal Pain (x 3 weeks)  on 07/23/2018  HPI: 3 wk h/o vaginal discharge. 4 years with  same partner. Vaginal irritation. + abdominal pain. Denies fever, chill, nausea and vomiting. Has h/o BV. Does take baths. Reports recurrent sinus infection. Improves with amoxil. Has sinus tenderness, pressure, green, bloody discharge.  Review of Systems  Constitutional: Negative for chills and fever.  Respiratory: Negative for shortness of breath.   Cardiovascular: Negative for chest pain.  Gastrointestinal: Positive for abdominal pain. Negative for nausea and vomiting.  Genitourinary: Negative for dysuria.  Skin: Negative for rash.      Objective:    BP 100/67 (BP Location: Left Arm, Patient Position: Sitting, Cuff Size: Normal)   Pulse 83   Resp 16   Ht 5' 6.75" (1.695 m)   Wt 221 lb 6.4 oz (100.4 kg)   BMI 34.94 kg/m  Physical Exam  Constitutional: She is oriented to person, place, and time. She appears well-developed and well-nourished. No distress.  HENT:  Head: Normocephalic and atraumatic.  Eyes: No scleral icterus.  Neck: Neck supple.  Cardiovascular: Normal rate.  Pulmonary/Chest: Effort normal.  Abdominal: Soft.  Genitourinary: Uterus is not tender. Cervix exhibits no motion tenderness. Vaginal discharge (yellow) found.  Neurological: She is alert and oriented to person, place, and time.  Skin: Skin is warm and dry.  Psychiatric: She has a normal mood and affect.      Assessment & Plan:   Problem List Items Addressed This Visit    None    Visit Diagnoses    Routine screening for STI (sexually transmitted infection)    -  Primary   desires to be checked for all STD's today   Relevant Orders   RPR (Completed)   HIV antibody (with reflex) (Completed)   Hepatitis C Antibody (Completed)   Hepatitis B Core Antibody, total  (Completed)   Vaginal discharge       check wet prep and treat accordingly   Relevant Orders   Cervicovaginal ancillary only   Acute recurrent maxillary sinusitis       trial of amoxil   Relevant Medications   amoxicillin (AMOXIL) 500 MG capsule      Total face-to-face time with patient: 15 minutes. Over 50% of encounter was spent on counseling and coordination of care. Return if symptoms worsen or fail to improve.  Donnamae Jude 07/23/2018 4:36 PM

## 2018-07-24 ENCOUNTER — Encounter: Payer: Self-pay | Admitting: Family Medicine

## 2018-07-24 LAB — HEPATITIS C ANTIBODY: HEP C VIRUS AB: 0.1 {s_co_ratio} (ref 0.0–0.9)

## 2018-07-24 LAB — HEPATITIS B CORE ANTIBODY, TOTAL: HEP B C TOTAL AB: NEGATIVE

## 2018-07-24 LAB — HIV ANTIBODY (ROUTINE TESTING W REFLEX): HIV Screen 4th Generation wRfx: NONREACTIVE

## 2018-07-24 LAB — RPR: RPR: NONREACTIVE

## 2018-07-24 NOTE — Patient Instructions (Signed)
Vaginitis Vaginitis is an inflammation of the vagina. It can happen when the normal bacteria and yeast in the vagina grow too much. There are different types. Treatment will depend on the type you have. Follow these instructions at home:  Take all medicines as told by your doctor.  Keep your vagina area clean and dry. Avoid soap. Rinse the area with water.  Avoid washing and cleaning out the vagina (douching).  Do not use tampons or have sex (intercourse) until your treatment is done.  Wipe from front to back after going to the restroom.  Wear cotton underwear.  Avoid wearing underwear while you sleep until your vaginitis is gone.  Avoid tight pants. Avoid underwear or nylons without a cotton panel.  Take off wet clothing (such as a bathing suit) as soon as you can.  Use mild, unscented products. Avoid fabric softeners and scented: ? Feminine sprays. ? Laundry detergents. ? Tampons. ? Soaps or bubble baths.  Practice safe sex and use condoms. Get help right away if:  You have belly (abdominal) pain.  You have a fever or lasting symptoms for more than 2-3 days.  You have a fever and your symptoms suddenly get worse. This information is not intended to replace advice given to you by your health care provider. Make sure you discuss any questions you have with your health care provider. Document Released: 03/02/2009 Document Revised: 05/11/2016 Document Reviewed: 05/16/2012 Elsevier Interactive Patient Education  2017 [employer].

## 2018-07-25 LAB — CERVICOVAGINAL ANCILLARY ONLY
Bacterial vaginitis: NEGATIVE
Candida vaginitis: NEGATIVE
Chlamydia: NEGATIVE
NEISSERIA GONORRHEA: NEGATIVE
Trichomonas: NEGATIVE

## 2018-08-06 ENCOUNTER — Ambulatory Visit (INDEPENDENT_AMBULATORY_CARE_PROVIDER_SITE_OTHER): Payer: Medicaid Other

## 2018-08-06 ENCOUNTER — Other Ambulatory Visit (HOSPITAL_COMMUNITY)
Admission: RE | Admit: 2018-08-06 | Discharge: 2018-08-06 | Disposition: A | Discharge status: Home / Self Care | Payer: Medicaid Other | Source: Ambulatory Visit | Attending: Obstetrics & Gynecology | Admitting: Obstetrics & Gynecology

## 2018-08-06 ENCOUNTER — Ambulatory Visit: Payer: Medicaid Other

## 2018-08-06 VITALS — BP 96/65 | HR 64 | Resp 16 | Ht 66.75 in | Wt 224.6 lb

## 2018-08-06 DIAGNOSIS — N898 Other specified noninflammatory disorders of vagina: Secondary | ICD-10-CM | POA: Diagnosis not present

## 2018-08-06 LAB — POCT URINALYSIS DIP (MANUAL ENTRY)
Bilirubin, UA: NEGATIVE
Blood, UA: NEGATIVE
Glucose, UA: NEGATIVE mg/dL
Ketones, POC UA: NEGATIVE mg/dL
LEUKOCYTES UA: NEGATIVE
NITRITE UA: NEGATIVE
PROTEIN UA: NEGATIVE mg/dL
Spec Grav, UA: 1.015 (ref 1.010–1.025)
Urobilinogen, UA: 0.2 E.U./dL
pH, UA: 6 (ref 5.0–8.0)

## 2018-08-06 NOTE — Progress Notes (Signed)
Patient seen and assessed by nursing staff.  Agree with documentation and plan.

## 2018-08-06 NOTE — Progress Notes (Signed)
SUBJECTIVE:  42 y.o. female complains of white, greyish, itchy and thick vaginal discharge with odor for 4 week(s). Denies abnormal vaginal bleeding. Patient reports on and off suprapubic pain for the last 4 weeks as well.  No UTI symptoms. Patient reports she has had Gonorrhea in the past - when she was 42 year old.  No LMP recorded. Patient has had a hysterectomy.  OBJECTIVE:  She appears well, afebrile. Urine dipstick: negative for all components.  ASSESSMENT:  Vaginal Discharge  Vaginal Odor   PLAN:  GC, chlamydia, trichomonas, BVAG, CVAG probe sent to lab. Treatment: To be determined once lab results are received ROV prn if symptoms persist or worsen.

## 2018-08-07 LAB — CERVICOVAGINAL ANCILLARY ONLY
Bacterial vaginitis: POSITIVE — AB
CHLAMYDIA, DNA PROBE: NEGATIVE
Candida vaginitis: NEGATIVE
NEISSERIA GONORRHEA: NEGATIVE
TRICH (WINDOWPATH): NEGATIVE

## 2018-08-08 ENCOUNTER — Other Ambulatory Visit: Payer: Self-pay | Admitting: Family Medicine

## 2018-08-08 DIAGNOSIS — N760 Acute vaginitis: Principal | ICD-10-CM

## 2018-08-08 DIAGNOSIS — B9689 Other specified bacterial agents as the cause of diseases classified elsewhere: Secondary | ICD-10-CM

## 2018-08-08 MED ORDER — METRONIDAZOLE 500 MG PO TABS: 500.0000 mg | ORAL_TABLET | Freq: Two times a day (BID) | ORAL | 0 refills | Status: DC

## 2018-08-21 NOTE — Telephone Encounter (Signed)
Pt has an appt 09/26/18 for her 3 mth fu

## 2018-08-28 ENCOUNTER — Encounter: Payer: Self-pay | Admitting: Family Medicine

## 2018-08-28 ENCOUNTER — Ambulatory Visit: Payer: Self-pay | Admitting: *Deleted

## 2018-08-28 ENCOUNTER — Ambulatory Visit: Payer: Medicaid Other | Admitting: Family Medicine

## 2018-08-28 VITALS — BP 102/68 | HR 89 | Temp 98.4°F | Resp 16 | Ht 67.0 in | Wt 215.0 lb

## 2018-08-28 DIAGNOSIS — K6289 Other specified diseases of anus and rectum: Secondary | ICD-10-CM

## 2018-08-28 DIAGNOSIS — K529 Noninfective gastroenteritis and colitis, unspecified: Secondary | ICD-10-CM | POA: Diagnosis not present

## 2018-08-28 DIAGNOSIS — K641 Second degree hemorrhoids: Secondary | ICD-10-CM | POA: Diagnosis not present

## 2018-08-28 DIAGNOSIS — K648 Other hemorrhoids: Secondary | ICD-10-CM | POA: Diagnosis not present

## 2018-08-28 DIAGNOSIS — K644 Residual hemorrhoidal skin tags: Secondary | ICD-10-CM

## 2018-08-28 MED ORDER — HYDROCORTISONE ACETATE 25 MG RE SUPP: 25.0000 mg | Freq: Two times a day (BID) | RECTAL | 0 refills | Status: DC

## 2018-08-28 NOTE — Telephone Encounter (Signed)
Pt reports mild- moderate rectal bleeding x 3 days. States bright red, no clots. States "I can feel hemorrhoids back there." Reports H/O hemorrhoids "For 2 years or so; never really went away." Presently with "Really bad" rectal pain. Reports she has been using Preparation -H suppositories 4 x daily, ineffective.Has "Pushed them back up but that doesn't last." Does report delay of stream with urination; "Takes a while to get started."  Reports rectal pain intermittently radiates to lower abdomen. Denies weakness, dizziness. Appt made for today with Dr. Ancil Boozer. Care advise given per protocol.  Reason for Disposition . MODERATE rectal bleeding (small blood clots, passing blood without stool, or toilet water turns red)  Answer Assessment - Initial Assessment Questions 1. APPEARANCE of BLOOD: "What color is it?" "Is it passed separately, on the surface of the stool, or mixed in with the stool?"      Bright red 2. AMOUNT: "How much blood was passed?"      Mild- moderate 3. FREQUENCY: "How many times has blood been passed with the stools?"      unsure 4. ONSET: "When was the blood first seen in the stools?" (Days or weeks)      Sunday night 5. DIARRHEA: "Is there also some diarrhea?" If so, ask: "How many diarrhea stools were passed in past 24 hours?"      1 episode Sunday 6. CONSTIPATION: "Do you have constipation?" If so, "How bad is it?"     no 7. RECURRENT SYMPTOMS: "Have you had blood in your stools before?" If so, ask: "When was the last time?" and "What happened that time?"     Yes, hemorrhoids. 8. BLOOD THINNERS: "Do you take any blood thinners?" (e.g., Coumadin/warfarin, Pradaxa/dabigatran, aspirin)    no 9. OTHER SYMPTOMS: "Do you have any other symptoms?"  (e.g., abdominal pain, vomiting, dizziness, fever)     Rectal pain, "Really bad."  Protocols used: RECTAL BLEEDING-A-AH

## 2018-08-28 NOTE — Progress Notes (Signed)
Name: Amanda Fields   MRN: [id number]    DOB: 08-04-1976   Date:08/28/2018       Progress Note  Subjective  Chief Complaint  Chief Complaint  Patient presents with  . Anorexia    patient stated that she has not wanted anything but water.   . Diarrhea    started on Sunday  . Hemorrhoids    patient stated that her hemorrhoids have been bleeding and is very painful  . Medication Refill    lexapro  . Immunizations    declines flu shot    HPI  Hemorrhoids: she sates she noticed bleeding on toilet bowel and when wiping for the past two weeks, however this past Sunday had 4 bowel movements and since than watery stools, followed by pain on anus and bulging of hemorrhoids. She has tried pushing hemorrhoids in but causes increase in pain, that shoots into her rectum. No fever or chills, she also had some nausea and vomiting, no abdominal pain or cramping. She has noticed decrease in appetite and is afraid to eat because it triggers nausea and vomiting and urge to have a bowel movement. No sick contacts. She did no eat out.    Patient Active Problem List   Diagnosis Date Noted  . Prolapsed internal hemorrhoids, grade 2 08/28/2018  . History of endometriosis 04/10/2018  . GAD (generalized anxiety disorder) 04/10/2018  . Mild recurrent major depression ([place]) 04/10/2018  . Morbid obesity, unspecified obesity type ([place]) 04/10/2018  . ADHD (attention deficit hyperactivity disorder), 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  . Palpitation 07/12/2017  . Family history of malignant neoplasm of gastrointestinal tract   . 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 Allergic rhinitis 02/04/2016  . Proteinuria 01/04/2016  . Leukocytosis 01/04/2016  . Elevated total protein 01/04/2016  . Pelvic pain in female 07/10/2012  . Bilateral leg edema 04/09/2012  . 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 Hypertension     Past Surgical History:  Procedure Laterality Date  . COLONOSCOPY WITH PROPOFOL N/A 04/09/2017   Procedure: COLONOSCOPY  WITH PROPOFOL;  Surgeon: Lucilla Lame, MD;  Location: [facility];  Service: Endoscopy;  Laterality: N/A;  LATEX sensitivity  . IR GENERIC HISTORICAL  07/07/2016   IR VENOGRAM RENAL UNI LEFT 07/07/2016 Aletta Edouard, MD [facility]  . IR GENERIC HISTORICAL  07/07/2016   IR VENOUS SAMPLING 07/07/2016 Aletta Edouard, MD [facility]  . IR GENERIC HISTORICAL  07/07/2016   IR VENOGRAM ADRENAL BI 07/07/2016 Aletta Edouard, MD [facility]  . IR GENERIC HISTORICAL  07/07/2016   IR US GUIDE VASC ACCESS RIGHT 07/07/2016 Aletta Edouard, MD [facility]  . IR GENERIC HISTORICAL  07/07/2016   IR ANGIOGRAM SELECTIVE EACH ADDITIONAL VESSEL 07/07/2016 Aletta Edouard, MD [facility]  . IR GENERIC HISTORICAL  07/07/2016   IR VENOUS SAMPLING 07/07/2016 Aletta Edouard, MD [facility]  . IR GENERIC HISTORICAL  07/07/2016   IR US GUIDE VASC ACCESS RIGHT 07/07/2016 Aletta Edouard, MD [facility]  . LAPAROSCOPIC SUPRACERVICAL HYSTERECTOMY  09/27/2010   For endometriosis. Ovaries anf tubes are in place.  Marland Kitchen NASAL SEPTUM SURGERY    . ROBOTIC ADRENALECTOMY Left 10/27/2016   Procedure: XI ROBOTIC LEFT ADRENALECTOMY;  Surgeon: Michael Boston, MD;  Location: [facility];  Service: General;  Laterality: Left;  . TONSILLECTOMY    . TUBAL LIGATION  2006  . WISDOM TOOTH EXTRACTION      Family History  Problem Relation Age of Onset  . COPD Mother   . Hypotension Mother   .  Colon cancer Father   . Emphysema Father   . Cancer Father        colon  . Hyperlipidemia Sister   . Heart failure Paternal Uncle   . Heart attack Cousin 32  . Heart disease Cousin        heart attack  . Breast cancer Maternal Aunt   . Breast cancer Other   . Adrenal disorder Neg Hx     Social History   Socioeconomic History  . Marital status: Single    Spouse name: Not on file  . Number of children: 2  . Years of education: Not on file  . Highest education level: Not on file  Occupational History  . Occupation: call center   Social Needs  . Financial resource strain: Not hard at all  . Food insecurity:    Worry: Never true    Inability: Never true  . Transportation needs:    Medical: No    Non-medical: No  Tobacco Use  . Smoking status: Never Smoker  . Smokeless tobacco: Never Used  Substance and Sexual Activity  . Alcohol use: Yes    Comment: social (1 drink/mo)  . Drug use: No  . Sexual activity: Yes    Partners: Male    Birth control/protection: Surgical  Lifestyle  . Physical activity:    Days per week: 0 days    Minutes per session: 0 min  . Stress: Not at all  Relationships  . Social connections:    Talks on phone: More than three times a week    Gets together: More than three times a week    Attends religious service: More than 4 times per year    Active member of club or organization: Yes    Attends meetings of clubs or organizations: 1 to 4 times per year    Relationship status: Never married  . Intimate partner violence:    Fear of current or ex partner: No    Emotionally abused: No    Physically abused: No    Forced sexual activity: No  Other Topics Concern  . Not on file  Social History Narrative  . Not on file     Current Outpatient Medications:  .  cloNIDine (CATAPRES) 0.1 MG tablet, Take 1 tablet (0.1 mg total) by mouth daily., Disp: 30 tablet, Rfl: 2 .  escitalopram (LEXAPRO) 20 MG tablet, Take 1 tablet (20 mg total) by mouth daily., Disp: 30 tablet, Rfl: 2 .  levocetirizine (XYZAL) 5 MG tablet, Take 5 mg by mouth daily as needed for allergies. , Disp: , Rfl:  .  methylphenidate 27 MG PO CR tablet, Take 1 tablet (27 mg total) by mouth daily., Disp: 30 tablet, Rfl: 0 .  metoprolol succinate (TOPROL-XL) 50 MG 24 hr tablet, Take 1 tablet (50 mg total) by mouth daily., Disp: 30 tablet, Rfl: 2 .  metroNIDAZOLE (FLAGYL) 500 MG tablet, Take 1 tablet (500 mg total) by mouth 2 (two) times daily., Disp: 14 tablet, Rfl: 0 .  spironolactone (ALDACTONE) 50 MG tablet, Take 1 tablet  (50 mg total) by mouth 2 (two) times daily., Disp: 30 tablet, Rfl: 2 .  valsartan-hydrochlorothiazide (DIOVAN-HCT) 320-25 MG tablet, Take 1 tablet by mouth daily., Disp: 30 tablet, Rfl: 2 .  hydrocortisone (ANUSOL-HC) 25 MG suppository, Place 1 suppository (25 mg total) rectally 2 (two) times daily., Disp: 12 suppository, Rfl: 0  Allergies  Allergen Reactions  . Ace Inhibitors Cough  . Latex Itching    Gloves,  condoms     ROS  Ten systems reviewed and is negative except as mentioned in HPI    Objective  Vitals:   08/28/18 1430  BP: 102/68  Pulse: 89  Resp: 16  Temp: 98.4 F (36.9 C)  TempSrc: Oral  SpO2: 98%  Weight: 215 lb (97.5 kg)  Height: 5' 7"  (1.702 m)    Body mass index is 33.67 kg/m.  Physical Exam  Constitutional: Patient appears well-developed and well-nourished. Obese. No distress.  HEENT: head atraumatic, normocephalic, pupils equal and reactive to light,  neck supple, throat within normal limits Cardiovascular: Normal rate, regular rhythm and normal heart sounds.  No murmur heard. No BLE edema. Pulmonary/Chest: Effort normal and breath sounds normal. No respiratory distress. Abdominal: Soft.  There is no tenderness. Anal exam showed external hemorrhoid not thrombosis , very tender and unable to do rectal exam Psychiatric: Patient has a normal mood and affect. behavior is normal. Judgment and thought content normal.  Recent Results (from the past 2160 hour(s))  Cervicovaginal ancillary only     Status: None   Collection Time: 07/23/18 12:00 AM  Result Value Ref Range   Bacterial vaginitis Negative for Bacterial Vaginitis Microorganisms     Comment: Normal Reference Range - Negative   Candida vaginitis Negative for Candida species     Comment: Normal Reference Range - Negative   Chlamydia Negative     Comment: Normal Reference Range - Negative   Neisseria gonorrhea Negative     Comment: Normal Reference Range - Negative   Trichomonas Negative      Comment: Normal Reference Range - Negative  RPR     Status: None   Collection Time: 07/23/18  4:48 PM  Result Value Ref Range   RPR Ser Ql Non Reactive Non Reactive  HIV antibody (with reflex)     Status: None   Collection Time: 07/23/18  4:48 PM  Result Value Ref Range   HIV Screen 4th Generation wRfx Non Reactive Non Reactive  Hepatitis C Antibody     Status: None   Collection Time: 07/23/18  4:48 PM  Result Value Ref Range   Hep C Virus Ab 0.1 0.0 - 0.9 s/co ratio    Comment:                                   Negative:     < 0.8                              Indeterminate: 0.8 - 0.9                                   Positive:     > 0.9  The CDC recommends that a positive HCV antibody result  be followed up with a HCV Nucleic Acid Amplification  test (846659).   Hepatitis B Core Antibody, total     Status: None   Collection Time: 07/23/18  4:48 PM  Result Value Ref Range   Hep B Core Total Ab Negative Negative  Cervicovaginal ancillary only     Status: Abnormal   Collection Time: 08/06/18 12:00 AM  Result Value Ref Range   Bacterial vaginitis **POSITIVE for Gardnerella vaginalis** (A)     Comment: Normal Reference Range - Negative   Candida vaginitis Negative  for Candida species     Comment: Normal Reference Range - Negative   Chlamydia Negative     Comment: Normal Reference Range - Negative   Neisseria gonorrhea Negative     Comment: Normal Reference Range - Negative   Trichomonas Negative     Comment: Normal Reference Range - Negative  POCT urinalysis dipstick     Status: Normal   Collection Time: 08/06/18  2:32 PM  Result Value Ref Range   Color, UA yellow yellow   Clarity, UA clear clear   Glucose, UA negative negative mg/dL   Bilirubin, UA negative negative   Ketones, POC UA negative negative mg/dL   Spec Grav, UA 1.015 1.010 - 1.025   Blood, UA negative negative   pH, UA 6.0 5.0 - 8.0   Protein Ur, POC negative negative mg/dL   Urobilinogen, UA 0.2 0.2 or 1.0  E.U./dL   Nitrite, UA Negative Negative   Leukocytes, UA Negative Negative     PHQ2/9: Depression screen [facility] 2/9 08/28/2018 06/27/2018 04/10/2018 04/10/2018 02/01/2018  Decreased Interest 0 2 - 3 0  Down, Depressed, Hopeless 0 0 - 0 1  PHQ - 2 Score 0 2 - 3 1  Altered sleeping 0 2 - 3 -  Tired, decreased energy 0 0 - 0 -  Change in appetite 0 2 - 2 -  Feeling bad or failure about yourself  0 0 - 0 -  Trouble concentrating 0 1 - 3 -  Moving slowly or fidgety/restless 0 0 - 0 -  Suicidal thoughts 0 0 - - -  PHQ-9 Score 0 7 - 11 -  Difficult doing work/chores Not difficult at all Somewhat difficult Not difficult at all Not difficult at all -     Fall Risk: Fall Risk  08/28/2018 02/01/2018 11/22/2017 10/29/2017 10/23/2017  Falls in the past year? No No No No No  Number falls in past yr: - - - - -  Injury with Fall? - - - - -  Follow up - - - - -    Functional Status Survey: Is the patient deaf or have difficulty hearing?: No Does the patient have difficulty seeing, even when wearing glasses/contacts?: No Does the patient have difficulty concentrating, remembering, or making decisions?: No Does the patient have difficulty walking or climbing stairs?: No Does the patient have difficulty dressing or bathing?: No Does the patient have difficulty doing errands alone such as visiting a doctor's office or shopping?: No    Assessment & Plan  1. Prolapsed internal hemorrhoids, grade 2  Per colonoscopy  - Ambulatory referral to Gastroenterology  2. Bleeding internal hemorrhoids  - Ambulatory referral to Gastroenterology  3. External hemorrhoid  - Ambulatory referral to Gastroenterology  4. Rectal pain  - Ambulatory referral to Gastroenterology  5. Gastroenteritis

## 2018-08-30 ENCOUNTER — Encounter: Payer: Self-pay | Admitting: Gastroenterology

## 2018-08-30 ENCOUNTER — Ambulatory Visit: Payer: Medicaid Other | Admitting: Gastroenterology

## 2018-08-30 ENCOUNTER — Ambulatory Visit (INDEPENDENT_AMBULATORY_CARE_PROVIDER_SITE_OTHER): Payer: Medicaid Other | Admitting: Gastroenterology

## 2018-08-30 VITALS — BP 96/66 | HR 69 | Resp 16 | Ht 67.0 in | Wt 219.2 lb

## 2018-08-30 DIAGNOSIS — K641 Second degree hemorrhoids: Secondary | ICD-10-CM

## 2018-08-30 DIAGNOSIS — K648 Other hemorrhoids: Secondary | ICD-10-CM | POA: Diagnosis not present

## 2018-08-30 DIAGNOSIS — K625 Hemorrhage of anus and rectum: Secondary | ICD-10-CM

## 2018-08-30 NOTE — Progress Notes (Signed)
PROCEDURE NOTE: The patient presents with symptomatic grade 2 hemorrhoids, unresponsive to maximal medical therapy, requesting rubber band ligation of his/her hemorrhoidal disease.  All risks, benefits and alternative forms of therapy were described and informed consent was obtained.  In the Left Lateral Decubitus position (if anoscopy is performed) anoscopic examination revealed grade 2 hemorrhoids in the all position(s).   The decision was made to band the RP internal hemorrhoid, and the [place] was used to perform band ligation without complication.  Digital anorectal examination was then performed to assure proper positioning of the band, and to adjust the banded tissue as required.  The patient was discharged home without pain or other issues.  Dietary and behavioral recommendations were given and (if necessary - prescriptions were given), along with follow-up instructions.  The patient will return 2 weeks for follow-up and possible additional banding as required.  No complications were encountered and the patient tolerated the procedure well.  Cephas Darby, MD [address]  [place]  [place], [place] [address]  Main: [phone]  Fax: [phone] Pager: [phone]

## 2018-08-30 NOTE — Patient Instructions (Signed)
High-Fiber Diet Fiber, also called dietary fiber, is a type of carbohydrate found in fruits, vegetables, whole grains, and beans. A high-fiber diet can have many health benefits. Your health care provider may recommend a high-fiber diet to help:  Prevent constipation. Fiber can make your bowel movements more regular.  Lower your cholesterol.  Relieve hemorrhoids, uncomplicated diverticulosis, or irritable bowel syndrome.  Prevent overeating as part of a weight-loss plan.  Prevent heart disease, type 2 diabetes, and certain cancers.  What is my plan? The recommended daily intake of fiber includes:  38 grams for men under age 5.  7 grams for men over age 35.  17 grams for women under age 76.  16 grams for women over age 69.  You can get the recommended daily intake of dietary fiber by eating a variety of fruits, vegetables, grains, and beans. Your health care provider may also recommend a fiber supplement if it is not possible to get enough fiber through your diet. What do I need to know about a high-fiber diet?  Fiber supplements have not been widely studied for their effectiveness, so it is better to get fiber through food sources.  Always check the fiber content on thenutrition facts label of any prepackaged food. Look for foods that contain at least 5 grams of fiber per serving.  Ask your dietitian if you have questions about specific foods that are related to your condition, especially if those foods are not listed in the following section.  Increase your daily fiber consumption gradually. Increasing your intake of dietary fiber too quickly may cause bloating, cramping, or gas.  Drink plenty of water. Water helps you to digest fiber. What foods can I eat? Grains Whole-grain breads. Multigrain cereal. Oats and oatmeal. Brown rice. Barley. Bulgur wheat. Bluejacket. Bran muffins. Popcorn. Rye wafer crackers. Vegetables Sweet potatoes. Spinach. Kale. Artichokes. Cabbage.  Broccoli. Russ Looper peas. Carrots. Squash. Fruits Berries. Pears. Apples. Oranges. Avocados. Prunes and raisins. Dried figs. Meats and Other Protein Sources Navy, kidney, pinto, and soy beans. Split peas. Lentils. Nuts and seeds. Dairy Fiber-fortified yogurt. Beverages Fiber-fortified soy milk. Fiber-fortified orange juice. Other Fiber bars. The items listed above may not be a complete list of recommended foods or beverages. Contact your dietitian for more options. What foods are not recommended? Grains White bread. Pasta made with refined flour. White rice. Vegetables Fried potatoes. Canned vegetables. Well-cooked vegetables. Fruits Fruit juice. Cooked, strained fruit. Meats and Other Protein Sources Fatty cuts of meat. Fried Sales executive or fried fish. Dairy Milk. Yogurt. Cream cheese. Sour cream. Beverages Soft drinks. Other Cakes and pastries. Butter and oils. The items listed above may not be a complete list of foods and beverages to avoid. Contact your dietitian for more information. What are some tips for including high-fiber foods in my diet?  Eat a wide variety of high-fiber foods.  Make sure that half of all grains consumed each day are whole grains.  Replace breads and cereals made from refined flour or white flour with whole-grain breads and cereals.  Replace white rice with brown rice, bulgur wheat, or millet.  Start the day with a breakfast that is high in fiber, such as a cereal that contains at least 5 grams of fiber per serving.  Use beans in place of meat in soups, salads, or pasta.  Eat high-fiber snacks, such as berries, raw vegetables, nuts, or popcorn. This information is not intended to replace advice given to you by your health care provider. Make sure you discuss any  questions you have with your health care provider. Document Released: 12/04/2005 Document Revised: 05/11/2016 Document Reviewed: 05/19/2014 Elsevier Interactive Patient Education  [employer].

## 2018-08-30 NOTE — Progress Notes (Signed)
Amanda Darby, MD [address]  [place]  [place], [place] [address]  Main: [phone]  Fax: [phone]    Gastroenterology Consultation  Referring Provider:     Steele Sizer, MD Primary Care Physician:  Steele Sizer, MD Primary Gastroenterologist:  Dr. Allen Norris Reason for Consultation:     Rectal bleeding,symptomatic hemorrhoids        HPI:   Amanda Fields is a 42 y.o. female referred by Dr. Steele Sizer, MD  for consultation & management of symptomatic hemorrhoids. Patient has been suffering from hemorrhoid symptoms for the last 13 years after her pregnancy. Symptoms include intermittent rectal bleeding, itching, discomfort, burning and protrusion. She had a colonoscopy in 2018 due to rectal bleeding and family history of colon cancer. She was found to have internal hemorrhoids and bowel prep was fair. She did not have repeat colonoscopy since then. She reports that she had severe flare up of hemorrhoids past Sunday. She uses Preparation H as needed. She has been trying to use suppository, sitz baths with Epsom salt. She reports that her symptoms have gotten better. She denies constipation. She denies any other GI symptoms.  NSAIDs: none  Antiplts/Anticoagulants/Anti thrombotics: none  Father with colon cancer at age 49 She had partial hysterectomy  GI Procedures: colonoscopy 04/09/2017 by Dr. Allen Norris - Preparation of the colon was fair. - Non-bleeding internal hemorrhoids. - No specimens collected.  Past Medical History:  Diagnosis Date  . ADHD (attention deficit hyperactivity disorder), 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  . Adrenal mass, left ([place])   . Anemia    none since Hysterectomy done-related to heavy menses  . Asthma    Related to allergies"wheezes with pollen exposure" no asthma attacks  . Breast lipoma 09/06/2011   resolved- no surgery-tx. medically.  . Cough due to ACE inhibitor   . Dizziness   . Edema leg   . Elevated troponin I level 12/31/2015    . Endometriosis   . Hypertension    a. echo 03/2012 EF 60-65%, moderate LVH, nl LV diastolic fxn, nl PASP; b. echo 12/2015: EF 55-60%  . Hypertensive urgency 12/31/2015  . Migraine headache    migrianes-less frequent now  . Obesity   . Palpitation     Past Surgical History:  Procedure Laterality Date  . COLONOSCOPY WITH PROPOFOL N/A 04/09/2017   Procedure: COLONOSCOPY WITH PROPOFOL;  Surgeon: Lucilla Lame, MD;  Location: [facility];  Service: Endoscopy;  Laterality: N/A;  LATEX sensitivity  . IR GENERIC HISTORICAL  07/07/2016   IR VENOGRAM RENAL UNI LEFT 07/07/2016 Aletta Edouard, MD [facility]  . IR GENERIC HISTORICAL  07/07/2016   IR VENOUS SAMPLING 07/07/2016 Aletta Edouard, MD [facility]  . IR GENERIC HISTORICAL  07/07/2016   IR VENOGRAM ADRENAL BI 07/07/2016 Aletta Edouard, MD [facility]  . IR GENERIC HISTORICAL  07/07/2016   IR US GUIDE VASC ACCESS RIGHT 07/07/2016 Aletta Edouard, MD [facility]  . IR GENERIC HISTORICAL  07/07/2016   IR ANGIOGRAM SELECTIVE EACH ADDITIONAL VESSEL 07/07/2016 Aletta Edouard, MD [facility]  . IR GENERIC HISTORICAL  07/07/2016   IR VENOUS SAMPLING 07/07/2016 Aletta Edouard, MD [facility]  . IR GENERIC HISTORICAL  07/07/2016   IR US GUIDE VASC ACCESS RIGHT 07/07/2016 Aletta Edouard, MD [facility]  . LAPAROSCOPIC SUPRACERVICAL HYSTERECTOMY  09/27/2010   For endometriosis. Ovaries anf tubes are in place.  Marland Kitchen NASAL SEPTUM SURGERY    . ROBOTIC ADRENALECTOMY Left 10/27/2016   Procedure: XI ROBOTIC LEFT ADRENALECTOMY;  Surgeon:  Michael Boston, MD;  Location: [facility];  Service: General;  Laterality: Left;  . TONSILLECTOMY    . TUBAL LIGATION  2006  . WISDOM TOOTH EXTRACTION      Current Outpatient Medications:  .  cloNIDine (CATAPRES) 0.1 MG tablet, Take 1 tablet (0.1 mg total) by mouth daily., Disp: 30 tablet, Rfl: 2 .  escitalopram (LEXAPRO) 20 MG tablet, Take 1 tablet (20 mg total) by mouth daily., Disp: 30 tablet, Rfl: 2 .   hydrocortisone (ANUSOL-HC) 25 MG suppository, Place 1 suppository (25 mg total) rectally 2 (two) times daily., Disp: 12 suppository, Rfl: 0 .  levocetirizine (XYZAL) 5 MG tablet, Take 5 mg by mouth daily as needed for allergies. , Disp: , Rfl:  .  methylphenidate 27 MG PO CR tablet, Take 1 tablet (27 mg total) by mouth daily., Disp: 30 tablet, Rfl: 0 .  metoprolol succinate (TOPROL-XL) 50 MG 24 hr tablet, Take 1 tablet (50 mg total) by mouth daily., Disp: 30 tablet, Rfl: 2 .  spironolactone (ALDACTONE) 50 MG tablet, Take 1 tablet (50 mg total) by mouth 2 (two) times daily., Disp: 30 tablet, Rfl: 2 .  valsartan-hydrochlorothiazide (DIOVAN-HCT) 320-25 MG tablet, Take 1 tablet by mouth daily., Disp: 30 tablet, Rfl: 2   Family History  Problem Relation Age of Onset  . COPD Mother   . Hypotension Mother   . Colon cancer Father   . Emphysema Father   . Cancer Father        colon  . Hyperlipidemia Sister   . Heart failure Paternal Uncle   . Heart attack Cousin 32  . Heart disease Cousin        heart attack  . Breast cancer Maternal Aunt   . Breast cancer Other   . Adrenal disorder Neg Hx      Social History   Tobacco Use  . Smoking status: Never Smoker  . Smokeless tobacco: Never Used  Substance Use Topics  . Alcohol use: Yes    Comment: social (1 drink/mo)  . Drug use: No    Allergies as of 08/30/2018 - Review Complete 08/30/2018  Allergen Reaction Noted  . Ace inhibitors Cough 03/22/2012  . Latex Itching 12/22/2015    Review of Systems:    All systems reviewed and negative except where noted in HPI.   Physical Exam:  BP 96/66 (BP Location: Left Arm, Patient Position: Sitting, Cuff Size: Large)   Pulse 69   Resp 16   Ht 5' 7"  (1.702 m)   Wt 219 lb 3.2 oz (99.4 kg)   BMI 34.33 kg/m  No LMP recorded. Patient has had a hysterectomy.  General:   Alert,  Well-developed, well-nourished, pleasant and cooperative in NAD Head:  Normocephalic and atraumatic. Eyes:  Sclera  clear, no icterus.   Conjunctiva pink. Ears:  Normal auditory acuity. Nose:  No deformity, discharge, or lesions. Mouth:  No deformity or lesions,oropharynx pink & moist. Neck:  Supple; no masses or thyromegaly. Lungs:  Respirations even and unlabored.  Clear throughout to auscultation.   No wheezes, crackles, or rhonchi. No acute distress. Heart:  Regular rate and rhythm; no murmurs, clicks, rubs, or gallops. Abdomen:  Normal bowel sounds. Soft, non-tender and non-distended without masses, hepatosplenomegaly or hernias noted.  No guarding or rebound tenderness.   Rectal: skin tags, internal and external hemorrhoids, nontender Msk:  Symmetrical without gross deformities. Good, equal movement & strength bilaterally. Pulses:  Normal pulses noted. Extremities:  No clubbing or edema.  No cyanosis. Neurologic:  Alert and oriented x3;  grossly normal neurologically. Skin:  Intact without significant lesions or rashes. No jaundice. Lymph Nodes:  No significant cervical adenopathy. Psych:  Alert and cooperative. Normal mood and affect.  Imaging Studies: eviewed  Assessment and Plan:   Amanda Fields is a 42 y.o. African-American female with history of symptomatic hemorrhoids, family history of colon cancer, seen in consultation for recent flare up of hemorrhoids  Symptomatic hemorrhoids: Discussed with her about outpatient hemorrhoid ligation and patient is agreeable She does not have anal fissure on rectal exam today Performed hemorrhoid ligation today, consent obtained  Colon cancer in first-degree relative less than 60 years: Colonoscopy in 2018 with fair prep Repeat colonoscopy in 6 months after treatment of her hemorrhoids   Follow up in 2 weeks   Amanda Darby, MD

## 2018-09-12 ENCOUNTER — Encounter: Payer: Self-pay | Admitting: Radiology

## 2018-09-20 ENCOUNTER — Encounter: Payer: Self-pay | Admitting: Gastroenterology

## 2018-09-20 ENCOUNTER — Ambulatory Visit: Payer: Medicaid Other | Admitting: Gastroenterology

## 2018-09-20 DIAGNOSIS — K625 Hemorrhage of anus and rectum: Secondary | ICD-10-CM

## 2018-09-24 ENCOUNTER — Other Ambulatory Visit: Payer: Self-pay | Admitting: Family Medicine

## 2018-09-24 DIAGNOSIS — F900 Attention-deficit hyperactivity disorder, predominantly inattentive type: Secondary | ICD-10-CM

## 2018-09-26 ENCOUNTER — Other Ambulatory Visit: Payer: Self-pay | Admitting: Family Medicine

## 2018-09-26 ENCOUNTER — Ambulatory Visit: Payer: Medicaid Other | Admitting: Family Medicine

## 2018-09-26 ENCOUNTER — Encounter: Payer: Self-pay | Admitting: Family Medicine

## 2018-09-26 VITALS — BP 104/68 | HR 71 | Temp 98.1°F | Resp 16 | Ht 67.0 in | Wt 204.1 lb

## 2018-09-26 DIAGNOSIS — I10 Essential (primary) hypertension: Secondary | ICD-10-CM | POA: Diagnosis not present

## 2018-09-26 DIAGNOSIS — F900 Attention-deficit hyperactivity disorder, predominantly inattentive type: Secondary | ICD-10-CM

## 2018-09-26 DIAGNOSIS — Z131 Encounter for screening for diabetes mellitus: Secondary | ICD-10-CM | POA: Diagnosis not present

## 2018-09-26 DIAGNOSIS — R002 Palpitations: Secondary | ICD-10-CM

## 2018-09-26 DIAGNOSIS — R634 Abnormal weight loss: Secondary | ICD-10-CM | POA: Diagnosis not present

## 2018-09-26 DIAGNOSIS — Z13220 Encounter for screening for lipoid disorders: Secondary | ICD-10-CM | POA: Diagnosis not present

## 2018-09-26 DIAGNOSIS — Z23 Encounter for immunization: Secondary | ICD-10-CM | POA: Diagnosis not present

## 2018-09-26 DIAGNOSIS — Z113 Encounter for screening for infections with a predominantly sexual mode of transmission: Secondary | ICD-10-CM | POA: Diagnosis not present

## 2018-09-26 DIAGNOSIS — F411 Generalized anxiety disorder: Secondary | ICD-10-CM | POA: Diagnosis not present

## 2018-09-26 DIAGNOSIS — N951 Menopausal and female climacteric states: Secondary | ICD-10-CM

## 2018-09-26 DIAGNOSIS — F339 Major depressive disorder, recurrent, unspecified: Secondary | ICD-10-CM

## 2018-09-26 MED ORDER — ESCITALOPRAM OXALATE 20 MG PO TABS: 20.0000 mg | ORAL_TABLET | Freq: Every day | ORAL | 2 refills | Status: DC

## 2018-09-26 MED ORDER — METHYLPHENIDATE HCL ER (OSM) 27 MG PO TBCR: 27.0000 mg | EXTENDED_RELEASE_TABLET | Freq: Every day | ORAL | 0 refills | Status: DC

## 2018-09-26 MED ORDER — SPIRONOLACTONE 25 MG PO TABS: 25.0000 mg | ORAL_TABLET | Freq: Two times a day (BID) | ORAL | 2 refills | Status: DC

## 2018-09-26 MED ORDER — METOPROLOL SUCCINATE ER 50 MG PO TB24: 50.0000 mg | ORAL_TABLET | Freq: Every day | ORAL | 2 refills | Status: DC

## 2018-09-26 MED ORDER — VALSARTAN-HYDROCHLOROTHIAZIDE 160-12.5 MG PO TABS: 1.0000 | ORAL_TABLET | Freq: Every day | ORAL | 2 refills | Status: DC

## 2018-09-26 NOTE — Progress Notes (Signed)
Name: Amanda Fields   MRN: [id number]    DOB: Jan 18, 1976   Date:09/26/2018       Progress Note  Subjective  Chief Complaint  Chief Complaint  Patient presents with  . Follow-up    3 mth f/u  . Hypertension  . ADHD  . Depression  . Anxiety  . Obesity  . Sinus Problem    HPI  ADHD: always had symptoms, but diagnosed as an adult by previous PCP, ADD questionnaire completed today.She is back on Concerta 27 mg, denies palpitation, bp has been at goal. She states dose is good for her  GAD and Mild Major Depression: she is taking Lexapro and seems to help with depression and anxiety. She was dating for 4 years and she found out he was cheating on her, she has since been walking to de-stress, also not eating as much, lost 14 lbs in the past month. She states first week she cried the entire time. She was also laid off from work Nov 2018 and is only having temporary jobs. She is financially broke. She has a good friend supply.   HTN: she states noticing dizziness intromittently and is now taking Diovan hctz every other day, we will decrease Aldactone from 50 mg to 25 mg and Diovan hctz from 320/25 to 160/12.5 . Continue metoprolol 50 mg for palpitation and return in 1-2 weeks for bp check only to see if bp needs to be adjusted   Obesity: she has lost 15 lbs since last visit because she is not eating, discussed importance of balanced diet.   Patient Active Problem List   Diagnosis Date Noted  . Prolapsed internal hemorrhoids, grade 2 08/28/2018  . History of endometriosis 04/10/2018  . GAD (generalized anxiety disorder) 04/10/2018  . Mild recurrent major depression ([place]) 04/10/2018  . Morbid obesity, unspecified obesity type ([place]) 04/10/2018  . ADHD (attention deficit hyperactivity disorder), 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  . Family history of malignant neoplasm of gastrointestinal tract   . 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 Allergic rhinitis 02/04/2016  . Bilateral leg edema 04/09/2012   . Hypertension     Past Surgical History:  Procedure Laterality Date  . COLONOSCOPY WITH PROPOFOL N/A 04/09/2017   Procedure: COLONOSCOPY WITH PROPOFOL;  Surgeon: Lucilla Lame, MD;  Location: [facility];  Service: Endoscopy;  Laterality: N/A;  LATEX sensitivity  . IR GENERIC HISTORICAL  07/07/2016   IR VENOGRAM RENAL UNI LEFT 07/07/2016 Aletta Edouard, MD [facility]  . IR GENERIC HISTORICAL  07/07/2016   IR VENOUS SAMPLING 07/07/2016 Aletta Edouard, MD [facility]  . IR GENERIC HISTORICAL  07/07/2016   IR VENOGRAM ADRENAL BI 07/07/2016 Aletta Edouard, MD [facility]  . IR GENERIC HISTORICAL  07/07/2016   IR US GUIDE VASC ACCESS RIGHT 07/07/2016 Aletta Edouard, MD [facility]  . IR GENERIC HISTORICAL  07/07/2016   IR ANGIOGRAM SELECTIVE EACH ADDITIONAL VESSEL 07/07/2016 Aletta Edouard, MD [facility]  . IR GENERIC HISTORICAL  07/07/2016   IR VENOUS SAMPLING 07/07/2016 Aletta Edouard, MD [facility]  . IR GENERIC HISTORICAL  07/07/2016   IR US GUIDE VASC ACCESS RIGHT 07/07/2016 Aletta Edouard, MD [facility]  . LAPAROSCOPIC SUPRACERVICAL HYSTERECTOMY  09/27/2010   For endometriosis. Ovaries anf tubes are in place.  Marland Kitchen NASAL SEPTUM SURGERY    . ROBOTIC ADRENALECTOMY Left 10/27/2016   Procedure: XI ROBOTIC LEFT ADRENALECTOMY;  Surgeon: Michael Boston, MD;  Location: [facility];  Service: General;  Laterality: Left;  . TONSILLECTOMY    .  TUBAL LIGATION  2006  . WISDOM TOOTH EXTRACTION      Family History  Problem Relation Age of Onset  . COPD Mother   . Hypotension Mother   . Colon cancer Father   . Emphysema Father   . Cancer Father        colon  . Hyperlipidemia Sister   . Heart failure Paternal Uncle   . Heart attack Cousin 32  . Heart disease Cousin        heart attack  . Breast cancer Maternal Aunt   . Breast cancer Other   . Adrenal disorder Neg Hx     Social History   Socioeconomic History  . Marital status: Single    Spouse name: Not on file  . Number of  children: 2  . Years of education: Not on file  . Highest education level: Bachelor's degree (e.g., BA, AB, BS)  Occupational History  . Not on file  Social Needs  . Financial resource strain: Not hard at all  . Food insecurity:    Worry: Never true    Inability: Never true  . Transportation needs:    Medical: No    Non-medical: No  Tobacco Use  . Smoking status: Never Smoker  . Smokeless tobacco: Never Used  Substance and Sexual Activity  . Alcohol use: Yes    Comment: social (1 drink/mo)  . Drug use: No  . Sexual activity: Yes    Partners: Male    Birth control/protection: Surgical  Lifestyle  . Physical activity:    Days per week: 0 days    Minutes per session: 0 min  . Stress: Not at all  Relationships  . Social connections:    Talks on phone: More than three times a week    Gets together: More than three times a week    Attends religious service: More than 4 times per year    Active member of club or organization: Yes    Attends meetings of clubs or organizations: 1 to 4 times per year    Relationship status: Never married  . Intimate partner violence:    Fear of current or ex partner: No    Emotionally abused: No    Physically abused: No    Forced sexual activity: No  Other Topics Concern  . Not on file  Social History Narrative  . Not on file     Current Outpatient Medications:  .  cloNIDine (CATAPRES) 0.1 MG tablet, TAKE 1 TABLET BY MOUTH EVERY DAY.Marland Kitchen NEED FOLLOW UP APPT, Disp: 90 tablet, Rfl: 0 .  escitalopram (LEXAPRO) 20 MG tablet, Take 1 tablet (20 mg total) by mouth daily., Disp: 30 tablet, Rfl: 2 .  levocetirizine (XYZAL) 5 MG tablet, Take 5 mg by mouth daily as needed for allergies. , Disp: , Rfl:  .  methylphenidate 27 MG PO CR tablet, Take 1 tablet (27 mg total) by mouth daily. Fill Nov 7th, 2019, Disp: 30 tablet, Rfl: 0 .  metoprolol succinate (TOPROL-XL) 50 MG 24 hr tablet, Take 1 tablet (50 mg total) by mouth daily., Disp: 30 tablet, Rfl: 2 .   spironolactone (ALDACTONE) 25 MG tablet, Take 1 tablet (25 mg total) by mouth 2 (two) times daily., Disp: 30 tablet, Rfl: 2 .  valsartan-hydrochlorothiazide (DIOVAN HCT) 160-12.5 MG tablet, Take 1 tablet by mouth daily., Disp: 30 tablet, Rfl: 2  Allergies  Allergen Reactions  . Ace Inhibitors Cough  . Latex Itching    Gloves, condoms  I personally reviewed active problem list, medication list, allergies, family history, social history with the patient/caregiver today.   ROS  Constitutional: Negative for fever , positive for weight change.  Respiratory: Negative for cough and shortness of breath.   Cardiovascular: Negative for chest pain or palpitations.  Gastrointestinal: Negative for abdominal pain, no bowel changes.  Musculoskeletal: Negative for gait problem or joint swelling.  Skin: Negative for rash.  Neurological: Negative for dizziness or headache.  No other specific complaints in a complete review of systems (except as listed in HPI above).  Objective  Vitals:   09/26/18 0805  BP: 104/68  Pulse: 71  Resp: 16  Temp: 98.1 F (36.7 C)  TempSrc: Oral  SpO2: 99%  Weight: 204 lb 1.6 oz (92.6 kg)  Height: 5' 7"  (1.702 m)    Body mass index is 31.97 kg/m.  Physical Exam  Constitutional: Patient appears well-developed and well-nourished. Obese No distress.  HEENT: head atraumatic, normocephalic, pupils equal and reactive to light,  neck supple, throat within normal limits Cardiovascular: Normal rate, regular rhythm and normal heart sounds.  No murmur heard. No BLE edema. Pulmonary/Chest: Effort normal and breath sounds normal. No respiratory distress. Abdominal: Soft.  There is no tenderness. Psychiatric: Patient has a normal mood and affect. behavior is normal. Judgment and thought content normal.  Recent Results (from the past 2160 hour(s))  Cervicovaginal ancillary only     Status: None   Collection Time: 07/23/18 12:00 AM  Result Value Ref Range   Bacterial  vaginitis Negative for Bacterial Vaginitis Microorganisms     Comment: Normal Reference Range - Negative   Candida vaginitis Negative for Candida species     Comment: Normal Reference Range - Negative   Chlamydia Negative     Comment: Normal Reference Range - Negative   Neisseria gonorrhea Negative     Comment: Normal Reference Range - Negative   Trichomonas Negative     Comment: Normal Reference Range - Negative  RPR     Status: None   Collection Time: 07/23/18  4:48 PM  Result Value Ref Range   RPR Ser Ql Non Reactive Non Reactive  HIV antibody (with reflex)     Status: None   Collection Time: 07/23/18  4:48 PM  Result Value Ref Range   HIV Screen 4th Generation wRfx Non Reactive Non Reactive  Hepatitis C Antibody     Status: None   Collection Time: 07/23/18  4:48 PM  Result Value Ref Range   Hep C Virus Ab 0.1 0.0 - 0.9 s/co ratio    Comment:                                   Negative:     < 0.8                              Indeterminate: 0.8 - 0.9                                   Positive:     > 0.9  The CDC recommends that a positive HCV antibody result  be followed up with a HCV Nucleic Acid Amplification  test (081448).   Hepatitis B Core Antibody, total     Status: None   Collection Time: 07/23/18  4:48  PM  Result Value Ref Range   Hep B Core Total Ab Negative Negative  Cervicovaginal ancillary only     Status: Abnormal   Collection Time: 08/06/18 12:00 AM  Result Value Ref Range   Bacterial vaginitis **POSITIVE for Gardnerella vaginalis** (A)     Comment: Normal Reference Range - Negative   Candida vaginitis Negative for Candida species     Comment: Normal Reference Range - Negative   Chlamydia Negative     Comment: Normal Reference Range - Negative   Neisseria gonorrhea Negative     Comment: Normal Reference Range - Negative   Trichomonas Negative     Comment: Normal Reference Range - Negative  POCT urinalysis dipstick     Status: Normal   Collection Time:  08/06/18  2:32 PM  Result Value Ref Range   Color, UA yellow yellow   Clarity, UA clear clear   Glucose, UA negative negative mg/dL   Bilirubin, UA negative negative   Ketones, POC UA negative negative mg/dL   Spec Grav, UA 1.015 1.010 - 1.025   Blood, UA negative negative   pH, UA 6.0 5.0 - 8.0   Protein Ur, POC negative negative mg/dL   Urobilinogen, UA 0.2 0.2 or 1.0 E.U./dL   Nitrite, UA Negative Negative   Leukocytes, UA Negative Negative      PHQ2/9: Depression screen [facility] 2/9 09/26/2018 08/28/2018 06/27/2018 04/10/2018 04/10/2018  Decreased Interest 2 0 2 - 3  Down, Depressed, Hopeless 1 0 0 - 0  PHQ - 2 Score 3 0 2 - 3  Altered sleeping 3 0 2 - 3  Tired, decreased energy 2 0 0 - 0  Change in appetite 3 0 2 - 2  Feeling bad or failure about yourself  3 0 0 - 0  Trouble concentrating 2 0 1 - 3  Moving slowly or fidgety/restless 2 0 0 - 0  Suicidal thoughts 1 0 0 - -  PHQ-9 Score 19 0 7 - 11  Difficult doing work/chores - Not difficult at all Somewhat difficult Not difficult at all Not difficult at all  Some recent data might be hidden     Fall Risk: Fall Risk  09/26/2018 08/28/2018 02/01/2018 11/22/2017 10/29/2017  Falls in the past year? No No No No No  Number falls in past yr: - - - - -  Injury with Fall? - - - - -  Follow up - - - - -      Assessment & Plan  1. HTN (hypertension), benign  - spironolactone (ALDACTONE) 25 MG tablet; Take 1 tablet (25 mg total) by mouth 2 (two) times daily.  Dispense: 30 tablet; Refill: 2 - valsartan-hydrochlorothiazide (DIOVAN HCT) 160-12.5 MG tablet; Take 1 tablet by mouth daily.  Dispense: 30 tablet; Refill: 2 - metoprolol succinate (TOPROL-XL) 50 MG 24 hr tablet; Take 1 tablet (50 mg total) by mouth daily.  Dispense: 30 tablet; Refill: 2  - COMPLETE METABOLIC PANEL WITH GFR - CBC with Differential/Platelet - TSH   2. ADHD (attention deficit hyperactivity disorder), inattentive type  - methylphenidate 27 MG PO CR tablet;  Take 1 tablet (27 mg total) by mouth daily. Fill Nov 7th, 2019  Dispense: 30 tablet; Refill: 0  3. Major depression, recurrent, chronic (HCC)  - escitalopram (LEXAPRO) 20 MG tablet; Take 1 tablet (20 mg total) by mouth daily.  Dispense: 30 tablet; Refill: 2  4. GAD (generalized anxiety disorder)  - escitalopram (LEXAPRO) 20 MG tablet; Take 1 tablet (20 mg total)  by mouth daily.  Dispense: 30 tablet; Refill: 2  5. Weight loss  Likely from Depression , she states she has a good support system, does not want to change medication   6. Morbid obesity, unspecified obesity type [facility])  Discussed with the patient the risk posed by an increased BMI. Discussed importance of portion control, calorie counting and at least 150 minutes of physical activity weekly. Avoid sweet beverages and drink more water. Eat at least 6 servings of fruit and vegetables daily   7. Palpitation  - metoprolol succinate (TOPROL-XL) 50 MG 24 hr tablet; Take 1 tablet (50 mg total) by mouth daily.  Dispense: 30 tablet; Refill: 2  8. Hot flashes due to menopause  - escitalopram (LEXAPRO) 20 MG tablet; Take 1 tablet (20 mg total) by mouth daily.  Dispense: 30 tablet; Refill: 2  9. Needs flu shot  Refused   10. Routine screening for STI (sexually transmitted infection)  - HIV Antibody (routine testing w rflx) - RPR  11. Diabetes mellitus screening  - Hemoglobin A1c  12. Lipid screening  - Lipid panel

## 2018-09-27 ENCOUNTER — Encounter: Payer: Self-pay | Admitting: Family Medicine

## 2018-09-27 DIAGNOSIS — R7303 Prediabetes: Secondary | ICD-10-CM

## 2018-09-27 HISTORY — DX: Prediabetes: R73.03

## 2018-09-27 LAB — COMPLETE METABOLIC PANEL WITH GFR
AG Ratio: 1.4 (calc) (ref 1.0–2.5)
ALT: 25 U/L (ref 6–29)
AST: 25 U/L (ref 10–30)
Albumin: 4 g/dL (ref 3.6–5.1)
Alkaline phosphatase (APISO): 41 U/L (ref 33–115)
BUN/Creatinine Ratio: 11 (calc) (ref 6–22)
BUN: 13 mg/dL (ref 7–25)
CO2: 30 mmol/L (ref 20–32)
CREATININE: 1.15 mg/dL — AB (ref 0.50–1.10)
Calcium: 9.5 mg/dL (ref 8.6–10.2)
Chloride: 101 mmol/L (ref 98–110)
GFR, EST AFRICAN AMERICAN: 67 mL/min/{1.73_m2} (ref >=60)
GFR, EST NON AFRICAN AMERICAN: 58 mL/min/{1.73_m2} — AB (ref >=60)
GLUCOSE: 99 mg/dL (ref 65–99)
Globulin: 2.9 g/dL (calc) (ref 1.9–3.7)
Potassium: 3.8 mmol/L (ref 3.5–5.3)
Sodium: 138 mmol/L (ref 135–146)
TOTAL PROTEIN: 6.9 g/dL (ref 6.1–8.1)
Total Bilirubin: 0.6 mg/dL (ref 0.2–1.2)

## 2018-09-27 LAB — CBC WITH DIFFERENTIAL/PLATELET
BASOS PCT: 1.1 %
Basophils Absolute: 52 cells/uL (ref 0–200)
Eosinophils Absolute: 249 cells/uL (ref 15–500)
Eosinophils Relative: 5.3 %
HEMATOCRIT: 41.5 % (ref 35.0–45.0)
HEMOGLOBIN: 13.3 g/dL (ref 11.7–15.5)
LYMPHS ABS: 1786 {cells}/uL (ref 850–3900)
MCH: 25.6 pg — ABNORMAL LOW (ref 27.0–33.0)
MCHC: 32 g/dL (ref 32.0–36.0)
MCV: 79.8 fL — AB (ref 80.0–100.0)
MPV: 11.1 fL (ref 7.5–12.5)
Monocytes Relative: 6.6 %
NEUTROS ABS: 2303 {cells}/uL (ref 1500–7800)
Neutrophils Relative %: 49 %
PLATELETS: 297 10*3/uL (ref 140–400)
RBC: 5.2 10*6/uL — AB (ref 3.80–5.10)
RDW: 16.1 % — ABNORMAL HIGH (ref 11.0–15.0)
Total Lymphocyte: 38 %
WBC: 4.7 10*3/uL (ref 3.8–10.8)
WBCMIX: 310 {cells}/uL (ref 200–950)

## 2018-09-27 LAB — TSH: TSH: 1.51 mIU/L

## 2018-09-27 LAB — LIPID PANEL
Cholesterol: 168 mg/dL (ref <200)
HDL: 42 mg/dL — ABNORMAL LOW (ref >50)
LDL CHOLESTEROL (CALC): 105 mg/dL — AB
NON-HDL CHOLESTEROL (CALC): 126 mg/dL (ref <130)
TRIGLYCERIDES: 115 mg/dL (ref <150)
Total CHOL/HDL Ratio: 4 (calc) (ref <5.0)

## 2018-09-27 LAB — HEMOGLOBIN A1C
HEMOGLOBIN A1C: 6.2 %{Hb} — AB (ref <5.7)
Mean Plasma Glucose: 131 (calc)
eAG (mmol/L): 7.3 (calc)

## 2018-09-27 LAB — RPR: RPR: NONREACTIVE

## 2018-09-27 LAB — HIV ANTIBODY (ROUTINE TESTING W REFLEX): HIV 1&2 Ab, 4th Generation: NONREACTIVE

## 2018-10-03 ENCOUNTER — Other Ambulatory Visit: Payer: Self-pay | Admitting: Family Medicine

## 2018-10-03 ENCOUNTER — Ambulatory Visit (INDEPENDENT_AMBULATORY_CARE_PROVIDER_SITE_OTHER): Payer: Medicaid Other

## 2018-10-03 VITALS — BP 92/58 | HR 82 | Temp 98.2°F

## 2018-10-03 DIAGNOSIS — I952 Hypotension due to drugs: Secondary | ICD-10-CM

## 2018-10-03 NOTE — Patient Instructions (Signed)
Patient is here for a blood pressure check. Patient denies chest pain, palpitations, shortness of breath or visual disturbances. At previous visit blood pressure was 104/68 with a heart rate of 71. Today during nurse visit first check blood pressure was 92/58. After resting for 10 minutes it was 96/60 and heart rate was 82.   After consulting with Dr. Steele Sizer, patient was told to stop taking the Aldactone and return in 1 week for a BP check only.

## 2018-10-09 ENCOUNTER — Ambulatory Visit: Payer: Medicaid Other | Admitting: Gastroenterology

## 2018-10-10 ENCOUNTER — Ambulatory Visit: Payer: Medicaid Other

## 2018-10-15 ENCOUNTER — Other Ambulatory Visit: Payer: Self-pay | Admitting: Family Medicine

## 2018-10-15 ENCOUNTER — Ambulatory Visit: Payer: Medicaid Other

## 2018-10-18 ENCOUNTER — Ambulatory Visit: Payer: Medicaid Other

## 2018-10-21 ENCOUNTER — Ambulatory Visit: Payer: Medicaid Other

## 2018-10-28 ENCOUNTER — Ambulatory Visit: Payer: Medicaid Other

## 2018-10-29 ENCOUNTER — Other Ambulatory Visit: Payer: Self-pay

## 2018-10-29 MED ORDER — METRONIDAZOLE 500 MG PO TABS: 500.0000 mg | ORAL_TABLET | Freq: Two times a day (BID) | ORAL | 0 refills | Status: DC

## 2018-10-29 NOTE — Telephone Encounter (Signed)
Patient thinks she has BV and would like to have flagyl sent into her pharmacy.

## 2018-10-30 ENCOUNTER — Other Ambulatory Visit: Payer: Self-pay

## 2018-10-30 NOTE — Telephone Encounter (Signed)
Flagyl called into patient pharmacy.

## 2018-11-11 ENCOUNTER — Ambulatory Visit (INDEPENDENT_AMBULATORY_CARE_PROVIDER_SITE_OTHER): Payer: Medicaid Other | Admitting: Obstetrics & Gynecology

## 2018-11-11 ENCOUNTER — Encounter: Payer: Self-pay | Admitting: Obstetrics & Gynecology

## 2018-11-11 VITALS — BP 105/72 | HR 80 | Ht 66.0 in | Wt 208.0 lb

## 2018-11-11 DIAGNOSIS — N762 Acute vulvitis: Secondary | ICD-10-CM

## 2018-11-11 DIAGNOSIS — Z0000 Encounter for general adult medical examination without abnormal findings: Secondary | ICD-10-CM | POA: Diagnosis not present

## 2018-11-11 DIAGNOSIS — Z90711 Acquired absence of uterus with remaining cervical stump: Secondary | ICD-10-CM

## 2018-11-11 DIAGNOSIS — Z1231 Encounter for screening mammogram for malignant neoplasm of breast: Secondary | ICD-10-CM

## 2018-11-11 DIAGNOSIS — Z01419 Encounter for gynecological examination (general) (routine) without abnormal findings: Secondary | ICD-10-CM | POA: Diagnosis not present

## 2018-11-11 LAB — POCT URINALYSIS DIPSTICK
LEUKOCYTES UA: NEGATIVE
RBC UA: NEGATIVE

## 2018-11-11 NOTE — Patient Instructions (Signed)
Return to clinic for any scheduled appointments or for any gynecologic concerns as needed.    Preventive Care 40-64 Years, Female Preventive care refers to lifestyle choices and visits with your health care provider that can promote health and wellness. What does preventive care include?  A yearly physical exam. This is also called an annual well check.  Dental exams once or twice a year.  Routine eye exams. Ask your health care provider how often you should have your eyes checked.  Personal lifestyle choices, including: ? Daily care of your teeth and gums. ? Regular physical activity. ? Eating a healthy diet. ? Avoiding tobacco and drug use. ? Limiting alcohol use. ? Practicing safe sex. ? Taking low-dose aspirin daily starting at age 60. ? Taking vitamin and mineral supplements as recommended by your health care provider. What happens during an annual well check? The services and screenings done by your health care provider during your annual well check will depend on your age, overall health, lifestyle risk factors, and family history of disease. Counseling Your health care provider may ask you questions about your:  Alcohol use.  Tobacco use.  Drug use.  Emotional well-being.  Home and relationship well-being.  Sexual activity.  Eating habits.  Work and work Statistician.  Method of birth control.  Menstrual cycle.  Pregnancy history.  Screening You may have the following tests or measurements:  Height, weight, and BMI.  Blood pressure.  Lipid and cholesterol levels. These may be checked every 5 years, or more frequently if you are over 54 years old.  Skin check.  Lung cancer screening. You may have this screening every year starting at age 55 if you have a 30-pack-year history of smoking and currently smoke or have quit within the past 15 years.  Fecal occult blood test (FOBT) of the stool. You may have this test every year starting at age  37.  Flexible sigmoidoscopy or colonoscopy. You may have a sigmoidoscopy every 5 years or a colonoscopy every 10 years starting at age 97.  Hepatitis C blood test.  Hepatitis B blood test.  Sexually transmitted disease (STD) testing.  Diabetes screening. This is done by checking your blood sugar (glucose) after you have not eaten for a while (fasting). You may have this done every 1-3 years.  Mammogram. This may be done every 1-2 years. Talk to your health care provider about when you should start having regular mammograms. This may depend on whether you have a family history of breast cancer.  BRCA-related cancer screening. This may be done if you have a family history of breast, ovarian, tubal, or peritoneal cancers.  Pelvic exam and Pap test. This may be done every 3 years starting at age 69. Starting at age 42, this may be done every 5 years if you have a Pap test in combination with an HPV test.  Bone density scan. This is done to screen for osteoporosis. You may have this scan if you are at high risk for osteoporosis.  Discuss your test results, treatment options, and if necessary, the need for more tests with your health care provider. Vaccines Your health care provider may recommend certain vaccines, such as:  Influenza vaccine. This is recommended every year.  Tetanus, diphtheria, and acellular pertussis (Tdap, Td) vaccine. You may need a Td booster every 10 years.  Varicella vaccine. You may need this if you have not been vaccinated.  Zoster vaccine. You may need this after age 41.  Measles, mumps, and  rubella (MMR) vaccine. You may need at least one dose of MMR if you were born in 1957 or later. You may also need a second dose.  Pneumococcal 13-valent conjugate (PCV13) vaccine. You may need this if you have certain conditions and were not previously vaccinated.  Pneumococcal polysaccharide (PPSV23) vaccine. You may need one or two doses if you smoke cigarettes or if you  have certain conditions.  Meningococcal vaccine. You may need this if you have certain conditions.  Hepatitis A vaccine. You may need this if you have certain conditions or if you travel or work in places where you may be exposed to hepatitis A.  Hepatitis B vaccine. You may need this if you have certain conditions or if you travel or work in places where you may be exposed to hepatitis B.  Haemophilus influenzae type b (Hib) vaccine. You may need this if you have certain conditions.  Talk to your health care provider about which screenings and vaccines you need and how often you need them. This information is not intended to replace advice given to you by your health care provider. Make sure you discuss any questions you have with your health care provider. Document Released: 12/31/2015 Document Revised: 08/23/2016 Document Reviewed: 10/05/2015 Elsevier Interactive Patient Education  [employer].

## 2018-11-11 NOTE — Progress Notes (Signed)
GYNECOLOGY ANNUAL PREVENTATIVE CARE ENCOUNTER NOTE  Subjective:   Amanda Fields is a 42 y.o. [id number] female here for a routine annual gynecologic exam.  Current complaints: vulvar itching for a few days.  Mild dysuria.  Denies abnormal vaginal bleeding, discharge, pelvic pain, problems with intercourse or other gynecologic concerns.    Gynecologic History No LMP recorded. Patient has had a supracervical hysterectomy. Contraception: status post hysterectomy Last Pap: 10/23/2017. Results were: ASCUS with negative HPV Last mammogram: 11/20/2017. Results were: normal  Obstetric History OB History  Gravida Para Term Preterm AB Living  2 2 2     2   SAB TAB Ectopic Multiple Live Births          2    # Outcome Date GA Lbr Len/2nd Weight Sex Delivery Anes PTL Lv  2 Term 12/18/04    M Vag-Spont   LIV  1 Term 10/30/95    F Vag-Spont   LIV    Past Medical History:  Diagnosis Date  . ADHD (attention deficit hyperactivity disorder), 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  . Adrenal mass, left ([place])   . Anemia    none since Hysterectomy done-related to heavy menses  . Asthma    Related to allergies"wheezes with pollen exposure" no asthma attacks  . Breast lipoma 09/06/2011   resolved- no surgery-tx. medically.  . Cough due to ACE inhibitor   . Dizziness   . Edema leg   . Elevated troponin I level 12/31/2015  . Endometriosis   . Hypertension    a. echo 03/2012 EF 60-65%, moderate LVH, nl LV diastolic fxn, nl PASP; b. echo 12/2015: EF 55-60%  . Hypertensive urgency 12/31/2015  . Migraine headache    migrianes-less frequent now  . Obesity   . Palpitation     Past Surgical History:  Procedure Laterality Date  . COLONOSCOPY WITH PROPOFOL N/A 04/09/2017   Procedure: COLONOSCOPY WITH PROPOFOL;  Surgeon: Lucilla Lame, MD;  Location: [facility];  Service: Endoscopy;  Laterality: N/A;  LATEX sensitivity  . IR GENERIC HISTORICAL  07/07/2016   IR VENOGRAM RENAL UNI LEFT 07/07/2016 Aletta Edouard, MD [facility]  . IR GENERIC HISTORICAL  07/07/2016   IR VENOUS SAMPLING 07/07/2016 Aletta Edouard, MD [facility]  . IR GENERIC HISTORICAL  07/07/2016   IR VENOGRAM ADRENAL BI 07/07/2016 Aletta Edouard, MD [facility]  . IR GENERIC HISTORICAL  07/07/2016   IR US GUIDE VASC ACCESS RIGHT 07/07/2016 Aletta Edouard, MD [facility]  . IR GENERIC HISTORICAL  07/07/2016   IR ANGIOGRAM SELECTIVE EACH ADDITIONAL VESSEL 07/07/2016 Aletta Edouard, MD [facility]  . IR GENERIC HISTORICAL  07/07/2016   IR VENOUS SAMPLING 07/07/2016 Aletta Edouard, MD [facility]  . IR GENERIC HISTORICAL  07/07/2016   IR US GUIDE VASC ACCESS RIGHT 07/07/2016 Aletta Edouard, MD [facility]  . LAPAROSCOPIC SUPRACERVICAL HYSTERECTOMY  09/27/2010   For endometriosis. Ovaries and tubes are in place.  Marland Kitchen NASAL SEPTUM SURGERY    . ROBOTIC ADRENALECTOMY Left 10/27/2016   Procedure: XI ROBOTIC LEFT ADRENALECTOMY;  Surgeon: Michael Boston, MD;  Location: [facility];  Service: General;  Laterality: Left;  . TONSILLECTOMY    . TUBAL LIGATION  2006  . WISDOM TOOTH EXTRACTION      Current Outpatient Medications on File Prior to Visit  Medication Sig Dispense Refill  . cloNIDine (CATAPRES) 0.1 MG tablet TAKE 1 TABLET BY MOUTH EVERY DAY.Marland Kitchen NEED FOLLOW UP APPT 90 tablet 0  . escitalopram (LEXAPRO) 20 MG tablet  Take 1 tablet (20 mg total) by mouth daily. 30 tablet 2  . metoprolol succinate (TOPROL-XL) 50 MG 24 hr tablet Take 1 tablet (50 mg total) by mouth daily. 30 tablet 2  . valsartan-hydrochlorothiazide (DIOVAN HCT) 160-12.5 MG tablet Take 1 tablet by mouth daily. 30 tablet 2  . levocetirizine (XYZAL) 5 MG tablet Take 5 mg by mouth daily as needed for allergies.     . methylphenidate 27 MG PO CR tablet Take 1 tablet (27 mg total) by mouth daily. Fill Nov 7th, 2019 30 tablet 0  . metroNIDAZOLE (FLAGYL) 500 MG tablet Take 1 tablet (500 mg total) by mouth 2 (two) times daily. 14 tablet 0  . [DISCONTINUED]  lisinopril-hydrochlorothiazide (PRINZIDE) 20-12.5 MG per tablet Take 1 tablet by mouth daily. 30 tablet 6  . [DISCONTINUED] torsemide (DEMADEX) 10 MG tablet Take 10 mg by mouth daily.       No current facility-administered medications on file prior to visit.     Allergies  Allergen Reactions  . Ace Inhibitors Cough  . Latex Itching    Gloves, condoms    Social History:  reports that she has never smoked. She has never used smokeless tobacco. She reports that she drinks alcohol. She reports that she does not use drugs.  Family History  Problem Relation Age of Onset  . COPD Mother   . Hypotension Mother   . Colon cancer Father   . Emphysema Father   . Cancer Father        colon  . Hyperlipidemia Sister   . Heart failure Paternal Uncle   . Heart attack Cousin 32  . Heart disease Cousin        heart attack  . Breast cancer Maternal Aunt   . Breast cancer Other   . Adrenal disorder Neg Hx     The following portions of the patient's history were reviewed and updated as appropriate: allergies, current medications, past family history, past medical history, past social history, past surgical history and problem list.  Review of Systems Pertinent items noted in HPI and remainder of comprehensive ROS otherwise negative.   Objective:  BP 105/72   Pulse 80   Ht 5' 6"  (1.676 m)   Wt 208 lb (94.3 kg)   BMI 33.57 kg/m  CONSTITUTIONAL: Well-developed, well-nourished female in no acute distress.  HENT:  Normocephalic, atraumatic, External right and left ear normal. Oropharynx is clear and moist EYES: Conjunctivae and EOM are normal. Pupils are equal, round, and reactive to light. No scleral icterus.  NECK: Normal range of motion, supple, no masses.  Normal thyroid.  SKIN: Skin is warm and dry. No rash noted. Not diaphoretic. No erythema. No pallor. MUSCULOSKELETAL: Normal range of motion. No tenderness.  No cyanosis, clubbing, or edema.  2+ distal pulses. NEUROLOGIC: Alert and  oriented to person, place, and time. Normal reflexes, muscle tone coordination. No cranial nerve deficit noted. PSYCHIATRIC: Normal mood and affect. Normal behavior. Normal judgment and thought content. CARDIOVASCULAR: Normal heart rate noted, regular rhythm RESPIRATORY: Clear to auscultation bilaterally. Effort and breath sounds normal, no problems with respiration noted. BREASTS: Symmetric in size. No masses, skin changes, nipple drainage, or lymphadenopathy. ABDOMEN: Soft, normal bowel sounds, no distention noted.  No tenderness, rebound or guarding.  PELVIC: Normal appearing external genitalia without lesions or erythema; normal appearing vaginal mucosa and cervix.  No abnormal discharge noted.  Pap smear obtained.  No palpable masses, no adnexal tenderness.  Results for orders placed or performed in visit  on 11/11/18 (from the past 24 hour(s))  POCT Urinalysis Dipstick     Status: Normal   Collection Time: 11/11/18  4:26 PM  Result Value Ref Range   Color, UA     Clarity, UA     Glucose, UA     Bilirubin, UA     Ketones, UA     Spec Grav, UA     Blood, UA negative    pH, UA     Protein, UA     Urobilinogen, UA     Nitrite, UA     Leukocytes, UA Negative Negative   Appearance     Odor       Assessment and Plan:  1. Acute vulvitis Negative UA, culture sent for confirmation. NuSwab done, will follow up results and manage accordingly. - POCT Urinalysis Dipstick - Urine Culture - NuSwab VG+, Candida 6sp  2. Encounter for screening mammogram for breast cancer Mammogram scheduled - MM 3D SCREEN BREAST BILATERAL; Future  3. Well woman exam with routine gynecological exam 4. History of supracervical hysterectomy - Pap IG and HPV (high risk) DNA detection Will follow up results of pap smear and manage accordingly.  Routine preventative health maintenance measures emphasized. Please refer to After Visit Summary for other counseling recommendations.    Verita Schneiders, MD,  [place] [facility] [employer] [facility], [place]

## 2018-11-12 LAB — URINE CULTURE: ORGANISM ID, BACTERIA: NO GROWTH

## 2018-11-13 ENCOUNTER — Encounter: Payer: Self-pay | Admitting: Obstetrics & Gynecology

## 2018-11-13 DIAGNOSIS — R87610 Atypical squamous cells of undetermined significance on cytologic smear of cervix (ASC-US): Secondary | ICD-10-CM | POA: Insufficient documentation

## 2018-11-13 LAB — NUSWAB VG+, CANDIDA 6SP
CANDIDA KRUSEI, NAA: NEGATIVE
CANDIDA TROPICALIS, NAA: NEGATIVE
Candida albicans, NAA: NEGATIVE
Candida glabrata, NAA: NEGATIVE
Candida lusitaniae, NAA: NEGATIVE
Candida parapsilosis, NAA: NEGATIVE
Chlamydia trachomatis, NAA: NEGATIVE
NEISSERIA GONORRHOEAE, NAA: NEGATIVE
TRICH VAG BY NAA: NEGATIVE

## 2018-11-13 LAB — PAP IG AND HPV HIGH-RISK
HPV, high-risk: NEGATIVE
PAP Smear Comment: 0

## 2018-12-10 ENCOUNTER — Other Ambulatory Visit (HOSPITAL_COMMUNITY)
Admission: RE | Admit: 2018-12-10 | Discharge: 2018-12-10 | Disposition: A | Discharge status: Home / Self Care | Payer: Medicaid Other | Source: Ambulatory Visit | Attending: Family Medicine | Admitting: Family Medicine

## 2018-12-10 ENCOUNTER — Ambulatory Visit: Payer: Medicaid Other | Admitting: Family Medicine

## 2018-12-10 ENCOUNTER — Encounter: Payer: Self-pay | Admitting: Family Medicine

## 2018-12-10 VITALS — BP 106/60 | HR 86 | Temp 97.8°F | Resp 16 | Ht 67.0 in | Wt 203.8 lb

## 2018-12-10 DIAGNOSIS — F5104 Psychophysiologic insomnia: Secondary | ICD-10-CM | POA: Diagnosis not present

## 2018-12-10 DIAGNOSIS — F411 Generalized anxiety disorder: Secondary | ICD-10-CM

## 2018-12-10 DIAGNOSIS — E896 Postprocedural adrenocortical (-medullary) hypofunction: Secondary | ICD-10-CM

## 2018-12-10 DIAGNOSIS — R002 Palpitations: Secondary | ICD-10-CM

## 2018-12-10 DIAGNOSIS — N898 Other specified noninflammatory disorders of vagina: Secondary | ICD-10-CM

## 2018-12-10 DIAGNOSIS — I152 Hypertension secondary to endocrine disorders: Secondary | ICD-10-CM | POA: Insufficient documentation

## 2018-12-10 DIAGNOSIS — N951 Menopausal and female climacteric states: Secondary | ICD-10-CM | POA: Diagnosis not present

## 2018-12-10 DIAGNOSIS — F339 Major depressive disorder, recurrent, unspecified: Secondary | ICD-10-CM | POA: Diagnosis not present

## 2018-12-10 DIAGNOSIS — F902 Attention-deficit hyperactivity disorder, combined type: Secondary | ICD-10-CM | POA: Diagnosis not present

## 2018-12-10 DIAGNOSIS — R0981 Nasal congestion: Secondary | ICD-10-CM | POA: Diagnosis not present

## 2018-12-10 DIAGNOSIS — G43001 Migraine without aura, not intractable, with status migrainosus: Secondary | ICD-10-CM | POA: Diagnosis not present

## 2018-12-10 MED ORDER — TOPIRAMATE 25 MG PO TABS: 25.0000 mg | ORAL_TABLET | Freq: Every evening | ORAL | 0 refills | Status: DC

## 2018-12-10 MED ORDER — METRONIDAZOLE 500 MG PO TABS: 500.0000 mg | ORAL_TABLET | Freq: Two times a day (BID) | ORAL | 0 refills | Status: DC

## 2018-12-10 MED ORDER — HYDROCHLOROTHIAZIDE 12.5 MG PO TABS: 12.5000 mg | ORAL_TABLET | Freq: Every day | ORAL | 0 refills | Status: DC

## 2018-12-10 MED ORDER — CLONIDINE HCL 0.1 MG PO TABS: 0.1000 mg | ORAL_TABLET | Freq: Every day | ORAL | 0 refills | Status: DC

## 2018-12-10 MED ORDER — AZELASTINE HCL 0.1 % NA SOLN: 2.0000 | Freq: Two times a day (BID) | NASAL | 0 refills | Status: DC

## 2018-12-10 MED ORDER — PREDNISONE 20 MG PO TABS: 20.0000 mg | ORAL_TABLET | Freq: Two times a day (BID) | ORAL | 0 refills | Status: DC

## 2018-12-10 MED ORDER — ESCITALOPRAM OXALATE 10 MG PO TABS: 10.0000 mg | ORAL_TABLET | Freq: Every day | ORAL | 1 refills | Status: DC

## 2018-12-10 NOTE — Progress Notes (Signed)
Name: Amanda Fields   MRN: [id number]    DOB: October 24, 1976   Date:12/10/2018       Progress Note  Subjective  Chief Complaint  Chief Complaint  Patient presents with  . Vaginal Discharge    Onset- Thursday will be a week after having intercourse and he used a condom, vaginal itching, odor and discharge-white milky-gray looking.  Marland Kitchen Hypertension    Decreased both BP Medications on last visit but BP is still low and when the Diovan-HCTZ decreased edema started back in bilateral ankles.    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 She has been off metoprolol ( lost rx ) for the past few weeks, likes taking clonidine at night because helps her sleep, she is currently only on diovan hctz, we will stop diovan today and continue HCTZ for mild leg swelling as requested but may need to stop on her next visit, advised to elevated legs and also use compression stocking hoses when at work.   Pre-diabetes: she has changed her diet since last visit when hgbA1C was above 6, she denies polyphagia, polydipsia or polyuria. She lost 23 lbs and is feeling well overall. More active   Vaginal discharge: same sexual partner, they use condoms, but states but had intercourse 6 days ago with a different type of condoms, and immediately developed itching, followed by copious discharge a couple days later and also odor, we will treat empirically for trichomonas, avoid intercourse until results back.   Major Depression and Anxiety: feeling better, likes when she is busy, wants to continue clonidine for sleep  Nasal congestion: out of flonase, noticing some pressure behind eyes and nasal congestion, no fever or post-nasal drainage.   Migraine headaches: she used to see migraine specialist, took  topamax in the past, having a throbbing headache, behind both eyes for the past 3 weeks and has been taking excedrin migraine, discussed rebound headaches, needs to take fluids, I will resume topamax today  Patient Active Problem List   Diagnosis Date Noted  . Hypertension due to endocrine disorder 12/10/2018  . ASCUS of cervix with negative high risk HPV in 10/2017 and 10/2018 11/13/2018  . Pre-diabetes 09/27/2018  . Prolapsed internal hemorrhoids, grade 2 08/28/2018  . History of endometriosis 04/10/2018  . GAD (generalized anxiety disorder) 04/10/2018  . Mild recurrent major depression ([place]) 04/10/2018  . Obesity (BMI 30.0-34.9) 04/10/2018  . ADHD (attention deficit hyperactivity disorder), 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  . Family history of malignant neoplasm of gastrointestinal tract   . 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 Allergic rhinitis 02/04/2016  . Bilateral leg edema 04/09/2012    Past Surgical History:  Procedure Laterality Date  . COLONOSCOPY WITH PROPOFOL N/A 04/09/2017   Procedure: COLONOSCOPY WITH PROPOFOL;  Surgeon: Lucilla Lame, MD;  Location: [facility];  Service: Endoscopy;  Laterality: N/A;  LATEX sensitivity  . IR GENERIC HISTORICAL  07/07/2016   IR VENOGRAM RENAL UNI LEFT 07/07/2016 Aletta Edouard, MD [facility]  . IR GENERIC HISTORICAL  07/07/2016   IR VENOUS SAMPLING 07/07/2016 Aletta Edouard, MD [facility]  . IR GENERIC HISTORICAL  07/07/2016   IR VENOGRAM ADRENAL BI 07/07/2016 Aletta Edouard, MD [facility]  . IR GENERIC HISTORICAL  07/07/2016   IR US GUIDE VASC ACCESS RIGHT 07/07/2016  Aletta Edouard, MD [facility]  . IR GENERIC HISTORICAL  07/07/2016   IR ANGIOGRAM SELECTIVE EACH ADDITIONAL VESSEL 07/07/2016 Aletta Edouard, MD [facility]  . IR GENERIC HISTORICAL  07/07/2016   IR VENOUS SAMPLING 07/07/2016 Aletta Edouard, MD [facility]  . IR GENERIC HISTORICAL  07/07/2016   IR US GUIDE VASC ACCESS RIGHT 07/07/2016 Aletta Edouard, MD [facility]  .  LAPAROSCOPIC SUPRACERVICAL HYSTERECTOMY  09/27/2010   For endometriosis. Ovaries and tubes are in place.  Marland Kitchen NASAL SEPTUM SURGERY    . ROBOTIC ADRENALECTOMY Left 10/27/2016   Procedure: XI ROBOTIC LEFT ADRENALECTOMY;  Surgeon: Michael Boston, MD;  Location: [facility];  Service: General;  Laterality: Left;  . TONSILLECTOMY    . TUBAL LIGATION  2006  . WISDOM TOOTH EXTRACTION      Family History  Problem Relation Age of Onset  . COPD Mother   . Hypotension Mother   . Colon cancer Father   . Emphysema Father   . Cancer Father        colon  . Hyperlipidemia Sister   . Heart failure Paternal Uncle   . Heart attack Cousin 32  . Heart disease Cousin        heart attack  . Breast cancer Maternal Aunt   . Breast cancer Other   . Adrenal disorder Neg Hx     Social History   Socioeconomic History  . Marital status: Single    Spouse name: Not on file  . Number of children: 2  . Years of education: Not on file  . Highest education level: Bachelor's degree (e.g., BA, AB, BS)  Occupational History  . Not on file  Social Needs  . Financial resource strain: Not hard at all  . Food insecurity:    Worry: Never true    Inability: Never true  . Transportation needs:    Medical: No    Non-medical: No  Tobacco Use  . Smoking status: Never Smoker  . Smokeless tobacco: Never Used  Substance and Sexual Activity  . Alcohol use: Yes    Comment: social (1 drink/mo)  . Drug use: No  . Sexual activity: Yes    Partners: Male    Birth control/protection: Surgical  Lifestyle  . Physical activity:    Days per week: 0 days    Minutes per session: 0 min  . Stress: Not at all  Relationships  . Social connections:    Talks on phone: More than three times a week    Gets together: More than three times a week    Attends religious service: More than 4 times per year    Active member of club or organization: Yes    Attends meetings of clubs or organizations: 1 to 4 times per year    Relationship  status: Never married  . Intimate partner violence:    Fear of current or ex partner: No    Emotionally abused: No    Physically abused: No    Forced sexual activity: No  Other Topics Concern  . Not on file  Social History Narrative  . Not on file     Current Outpatient Medications:  .  cloNIDine (CATAPRES) 0.1 MG tablet, Take 1 tablet (0.1 mg total) by mouth at bedtime., Disp: 90 tablet, Rfl: 0 .  escitalopram (LEXAPRO) 10 MG tablet, Take 1 tablet (10 mg total) by mouth daily., Disp: 30 tablet, Rfl: 1 .  methylphenidate 27 MG PO CR tablet, Take 1 tablet (27 mg  total) by mouth daily. Fill Nov 7th, 2019, Disp: 30 tablet, Rfl: 0 .  hydrochlorothiazide (HYDRODIURIL) 12.5 MG tablet, Take 1 tablet (12.5 mg total) by mouth daily., Disp: 30 tablet, Rfl: 0 .  metroNIDAZOLE (FLAGYL) 500 MG tablet, Take 1 tablet (500 mg total) by mouth 2 (two) times daily., Disp: 14 tablet, Rfl: 0  Allergies  Allergen Reactions  . Ace Inhibitors Cough  . Latex Itching    Gloves, condoms    I personally reviewed active problem list, medication list, allergies, family history, social history with the patient/caregiver today.   ROS  Constitutional: Negative for fever, positive for  weight change.  Respiratory: Negative for cough and shortness of breath.   Cardiovascular: Negative for chest pain or palpitations.  Gastrointestinal: Negative for abdominal pain, no bowel changes.  Musculoskeletal: Negative for gait problem or joint swelling.  Skin: Negative for rash.   Neurological: positive  for dizziness or headache.  No other specific complaints in a complete review of systems (except as listed in HPI above).  Objective  Vitals:   12/10/18 1146  BP: 106/60  Pulse: 86  Resp: 16  Temp: 97.8 F (36.6 C)  TempSrc: Oral  SpO2: 98%  Weight: 203 lb 12.8 oz (92.4 kg)  Height: 5' 7"  (1.702 m)    Body mass index is 31.92 kg/m.  Physical Exam  Constitutional: Patient appears well-developed and  well-nourished. ObeseNo distress.  HEENT: head atraumatic, normocephalic, pupils equal and reactive to light, neck supple, throat within normal limits Cardiovascular: Normal rate, regular rhythm and normal heart sounds.  No murmur heard. No BLE edema. Pulmonary/Chest: Effort normal and breath sounds normal. No respiratory distress. Abdominal: Soft.  There is no tenderness. Neurological: negative for focal findings.  Psychiatric: Patient has a normal mood and affect. behavior is normal. Judgment and thought content normal.  Recent Results (from the past 2160 hour(s))  COMPLETE METABOLIC PANEL WITH GFR     Status: Abnormal   Collection Time: 09/26/18  8:56 AM  Result Value Ref Range   Glucose, Bld 99 65 - 99 mg/dL    Comment: .            Fasting reference interval .    BUN 13 7 - 25 mg/dL   Creat 1.15 (H) 0.50 - 1.10 mg/dL   GFR, Est Non African American 58 (L) > OR = 60 mL/min/1.78m   GFR, Est African American 67 > OR = 60 mL/min/1.776m  BUN/Creatinine Ratio 11 6 - 22 (calc)   Sodium 138 135 - 146 mmol/L   Potassium 3.8 3.5 - 5.3 mmol/L   Chloride 101 98 - 110 mmol/L   CO2 30 20 - 32 mmol/L   Calcium 9.5 8.6 - 10.2 mg/dL   Total Protein 6.9 6.1 - 8.1 g/dL   Albumin 4.0 3.6 - 5.1 g/dL   Globulin 2.9 1.9 - 3.7 g/dL (calc)   AG Ratio 1.4 1.0 - 2.5 (calc)   Total Bilirubin 0.6 0.2 - 1.2 mg/dL   Alkaline phosphatase (APISO) 41 33 - 115 U/L   AST 25 10 - 30 U/L   ALT 25 6 - 29 U/L  CBC with Differential/Platelet     Status: Abnormal   Collection Time: 09/26/18  8:56 AM  Result Value Ref Range   WBC 4.7 3.8 - 10.8 Thousand/uL   RBC 5.20 (H) 3.80 - 5.10 Million/uL   Hemoglobin 13.3 11.7 - 15.5 g/dL   HCT 41.5 35.0 - 45.0 %   MCV 79.8 (L)  80.0 - 100.0 fL   MCH 25.6 (L) 27.0 - 33.0 pg   MCHC 32.0 32.0 - 36.0 g/dL   RDW 16.1 (H) 11.0 - 15.0 %   Platelets 297 140 - 400 Thousand/uL   MPV 11.1 7.5 - 12.5 fL   Neutro Abs 2,303 1,500 - 7,800 cells/uL   Lymphs Abs 1,786 850 - 3,900  cells/uL   WBC mixed population 310 200 - 950 cells/uL   Eosinophils Absolute 249 15 - 500 cells/uL   Basophils Absolute 52 0 - 200 cells/uL   Neutrophils Relative % 49 %   Total Lymphocyte 38.0 %   Monocytes Relative 6.6 %   Eosinophils Relative 5.3 %   Basophils Relative 1.1 %  Lipid panel     Status: Abnormal   Collection Time: 09/26/18  8:56 AM  Result Value Ref Range   Cholesterol 168 <200 mg/dL   HDL 42 (L) >50 mg/dL   Triglycerides 115 <150 mg/dL   LDL Cholesterol (Calc) 105 (H) mg/dL (calc)    Comment: Reference range: <100 . Desirable range <100 mg/dL for primary prevention;   <70 mg/dL for patients with CHD or diabetic patients  with > or = 2 CHD risk factors. Marland Kitchen LDL-C is now calculated using the Martin-Hopkins  calculation, which is a validated novel method providing  better accuracy than the Friedewald equation in the  estimation of LDL-C.  Cresenciano Genre et al. Annamaria Helling. 0626;948(54): 2061-2068  (http://education.[facility].com/faq/FAQ164)    Total CHOL/HDL Ratio 4.0 <5.0 (calc)   Non-HDL Cholesterol (Calc) 126 <130 mg/dL (calc)    Comment: For patients with diabetes plus 1 major ASCVD risk  factor, treating to a non-HDL-C goal of <100 mg/dL  (LDL-C of <70 mg/dL) is considered a therapeutic  option.   Hemoglobin A1c     Status: Abnormal   Collection Time: 09/26/18  8:56 AM  Result Value Ref Range   Hgb A1c MFr Bld 6.2 (H) <5.7 % of total Hgb    Comment: For someone without known diabetes, a hemoglobin  A1c value between 5.7% and 6.4% is consistent with prediabetes and should be confirmed with a  follow-up test. . For someone with known diabetes, a value <7% indicates that their diabetes is well controlled. A1c targets should be individualized based on duration of diabetes, age, comorbid conditions, and other considerations. . This assay result is consistent with an increased risk of diabetes. . Currently, no consensus exists regarding use of hemoglobin  A1c for diagnosis of diabetes for children. .    Mean Plasma Glucose 131 (calc)   eAG (mmol/L) 7.3 (calc)  TSH     Status: None   Collection Time: 09/26/18  8:56 AM  Result Value Ref Range   TSH 1.51 mIU/L    Comment:           Reference Range .           > or = 20 Years  0.40-4.50 .                Pregnancy Ranges           First trimester    0.26-2.66           Second trimester   0.55-2.73           Third trimester    0.43-2.91   HIV Antibody (routine testing w rflx)     Status: None   Collection Time: 09/26/18  8:56 AM  Result Value Ref Range   HIV 1&2  Ab, 4th Generation NON-REACTIVE NON-REACTI    Comment: HIV-1 antigen and HIV-1/HIV-2 antibodies were not detected. There is no laboratory evidence of HIV infection. Marland Kitchen PLEASE NOTE: This information has been disclosed to you from records whose confidentiality may be protected by state law.  If your state requires such protection, then the state law prohibits you from making any further disclosure of the information without the specific written consent of the person to whom it pertains, or as otherwise permitted by law. A general authorization for the release of medical or other information is NOT sufficient for this purpose. . For additional information please refer to http://education.[facility].com/faq/FAQ106 (This link is being provided for informational/ educational purposes only.) . Marland Kitchen The performance of this assay has not been clinically validated in patients less than 54 years old. .   RPR     Status: None   Collection Time: 09/26/18  8:56 AM  Result Value Ref Range   RPR Ser Ql NON-REACTIVE NON-REACTI  POCT Urinalysis Dipstick     Status: Normal   Collection Time: 11/11/18  4:26 PM  Result Value Ref Range   Color, UA     Clarity, UA     Glucose, UA     Bilirubin, UA     Ketones, UA     Spec Grav, UA     Blood, UA negative    pH, UA     Protein, UA     Urobilinogen, UA     Nitrite, UA      Leukocytes, UA Negative Negative   Appearance     Odor    Urine Culture     Status: None   Collection Time: 11/11/18  4:30 PM  Result Value Ref Range   Urine Culture, Routine Final report    Organism ID, Bacteria No growth   Pap IG and HPV (high risk) DNA detection     Status: None   Collection Time: 11/11/18  4:30 PM  Result Value Ref Range   DIAGNOSIS: Comment     Comment: NEGATIVE FOR INTRAEPITHELIAL LESION OR MALIGNANCY.   Specimen adequacy: Comment     Comment: Satisfactory for evaluation. Endocervical and/or squamous metaplastic cells (endocervical component) are present.    Clinician Provided ICD10 Comment     Comment: Z01.419   Performed by: Comment     Comment: Lewanda Rife, Cytotechnologist (ASCP)   PAP Smear Comment .    Note: Comment     Comment: The Pap smear is a screening test designed to aid in the detection of premalignant and malignant conditions of the uterine cervix.  It is not a diagnostic procedure and should not be used as the sole means of detecting cervical cancer.  Both false-positive and false-negative reports do occur.    Test Methodology Comment     Comment: This liquid based ThinPrep(R) pap test was screened with the use of an image guided system.    HPV, high-risk Negative Negative    Comment: This nucleic acid amplification high-risk HPV test detects thirteen high-risk types (16,18,31,33,35,39,45,51,52,56,58,59,68) without differentiation.   NuSwab VG+, Candida 6sp     Status: None   Collection Time: 11/11/18  4:30 PM  Result Value Ref Range   Atopobium vaginae Low - 0 Score   BVAB 2 Low - 0 Score   Megasphaera 1 Low - 0 Score    Comment: Calculate total score by adding the 3 individual bacterial vaginosis (BV) marker scores together.  Total score is interpreted as follows: Total score 0-1:  Indicates the absence of BV. Total score   2: Indeterminate for BV. Additional clinical                  data should be evaluated to establish a                   diagnosis. Total score 3-6: Indicates the presence of BV. This test was developed and its performance characteristics determined by [facility].  It has not been cleared or approved by the Food and Drug Administration.  The FDA has determined that such clearance or approval is not necessary.    Candida albicans, NAA Negative Negative   Candida glabrata, NAA Negative Negative    Comment: This test was developed and its performance characteristics determined by [facility].  It has not been cleared or approved by the Food and Drug Administration.  The FDA has determined that such clearance or approval is not necessary.    Candida tropicalis, NAA Negative Negative   Candida parapsilosis, NAA Negative Negative   Candida lusitaniae, NAA Negative Negative   Candida krusei, NAA Negative Negative    Comment: This test was developed and its performance characteristics determined by [facility].  It has not been cleared or approved by the Food and Drug Administration.  The FDA has determined that such clearance or approval is not necessary.    Trich vag by NAA Negative Negative   Chlamydia trachomatis, NAA Negative Negative   Neisseria gonorrhoeae, NAA Negative Negative      PHQ2/9: Depression screen [facility] 2/9 12/10/2018 09/26/2018 08/28/2018 06/27/2018 04/10/2018  Decreased Interest 0 2 0 2 -  Down, Depressed, Hopeless 0 1 0 0 -  PHQ - 2 Score 0 3 0 2 -  Altered sleeping 3 3 0 2 -  Tired, decreased energy 3 2 0 0 -  Change in appetite 0 3 0 2 -  Feeling bad or failure about yourself  0 3 0 0 -  Trouble concentrating 0 2 0 1 -  Moving slowly or fidgety/restless 0 2 0 0 -  Suicidal thoughts 0 1 0 0 -  PHQ-9 Score 6 19 0 7 -  Difficult doing work/chores Somewhat difficult - Not difficult at all Somewhat difficult Not difficult at all  Some recent data might be hidden     Fall Risk: Fall Risk  09/26/2018 08/28/2018 02/01/2018 11/22/2017 10/29/2017  Falls in the past year? No No No No  No  Number falls in past yr: - - - - -  Injury with Fall? - - - - -  Follow up - - - - -      Assessment & Plan  1. Vaginal discharge  - Cervicovaginal ancillary only - metroNIDAZOLE (FLAGYL) 500 MG tablet; Take 1 tablet (500 mg total) by mouth 2 (two) times daily.  Dispense: 14 tablet; Refill: 0  2. Vaginal odor  - Cervicovaginal ancillary only - metroNIDAZOLE (FLAGYL) 500 MG tablet; Take 1 tablet (500 mg total) by mouth 2 (two) times daily.  Dispense: 14 tablet; Refill: 0  3. 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 Palpitation  Doing well at this time  5. Hypertension due to endocrine disorder  - hydrochlorothiazide (HYDRODIURIL) 12.5 MG tablet; Take 1 tablet (12.5 mg total) by mouth daily.  Dispense: 30 tablet; Refill: 0  6. Major depression, recurrent, chronic (HCC)  - escitalopram (LEXAPRO) 10 MG tablet; Take 1 tablet (10 mg total) by mouth daily.  Dispense: 30 tablet; Refill: 1  7. Hot flashes due to menopause  - escitalopram (LEXAPRO) 10 MG tablet; Take 1 tablet (10 mg total) by mouth daily.  Dispense: 30 tablet; Refill: 1  8. GAD (generalized anxiety disorder)  - escitalopram (LEXAPRO) 10 MG tablet; Take 1 tablet (10 mg total) by mouth daily.  Dispense: 30 tablet; Refill: 1  9. Attention deficit hyperactivity disorder (ADHD), combined type  - cloNIDine (CATAPRES) 0.1 MG tablet; Take 1 tablet (0.1 mg total) by mouth at bedtime.  Dispense: 90 tablet; Refill: 0  10. Psychophysiological insomnia  - cloNIDine (CATAPRES) 0.1 MG tablet; Take 1 tablet (0.1 mg total) by mouth at bedtime.  Dispense: 90 tablet; Refill: 0  11. Nasal congestion  - azelastine (ASTELIN) 0.1 % nasal spray; Place 2 sprays into both nostrils 2 (two) times daily. Use in each nostril as directed  Dispense: 30 mL; Refill: 0  12. Migraine without aura and with status migrainosus, not intractable  - topiramate (TOPAMAX) 25 MG tablet; Take 1-4 tablets (25-100 mg total) by mouth every  evening. Go up by 1 tab every 3 days to max of 4 at night  Dispense: 84 tablet; Refill: 0 - predniSONE (DELTASONE) 20 MG tablet; Take 1 tablet (20 mg total) by mouth 2 (two) times daily with a meal.  Dispense: 2 tablet; Refill: 0

## 2018-12-12 LAB — CERVICOVAGINAL ANCILLARY ONLY
Bacterial vaginitis: POSITIVE — AB
Candida vaginitis: NEGATIVE
Chlamydia: NEGATIVE
Neisseria Gonorrhea: NEGATIVE
TRICH (WINDOWPATH): NEGATIVE

## 2018-12-20 ENCOUNTER — Ambulatory Visit: Payer: Medicaid Other | Admitting: Nurse Practitioner

## 2018-12-20 ENCOUNTER — Encounter: Payer: Self-pay | Admitting: Nurse Practitioner

## 2018-12-20 VITALS — BP 112/72 | HR 99 | Temp 99.6°F | Resp 16 | Ht 67.0 in | Wt 206.5 lb

## 2018-12-20 DIAGNOSIS — Z20828 Contact with and (suspected) exposure to other viral communicable diseases: Secondary | ICD-10-CM

## 2018-12-20 DIAGNOSIS — J101 Influenza due to other identified influenza virus with other respiratory manifestations: Secondary | ICD-10-CM

## 2018-12-20 DIAGNOSIS — R059 Cough, unspecified: Secondary | ICD-10-CM

## 2018-12-20 DIAGNOSIS — R05 Cough: Secondary | ICD-10-CM

## 2018-12-20 LAB — POCT INFLUENZA A/B
Influenza A, POC: POSITIVE — AB
Influenza B, POC: NEGATIVE

## 2018-12-20 MED ORDER — PROMETHAZINE-DM 6.25-15 MG/5ML PO SYRP: 2.5000 mL | ORAL_SOLUTION | Freq: Four times a day (QID) | ORAL | 0 refills | Status: DC | PRN

## 2018-12-20 MED ORDER — OSELTAMIVIR PHOSPHATE 75 MG PO CAPS: 75.0000 mg | ORAL_CAPSULE | Freq: Every day | ORAL | 0 refills | Status: DC

## 2018-12-20 NOTE — Patient Instructions (Addendum)
The flu can take up to 14 days to resolve, but typically last between 7-11 days. Your body is so smart and strong that it will be fighting this illness off for you but it is important that you drink plenty of fluids, rest. Cover your nose/mouth when you cough or sneeze and wash your hands well and often. Here are some helpful things you can use or pick up over the counter from the pharmacy to help with your symptoms:   For Fever/Pain: Acetaminophen every 6 hours as needed (maximum of 304m a day). If you are still uncomfortable you can add ibuprofen (no more than 2,4029mday) OR naproxen  For coughing: try dextromethorphan for a cough suppressant, and/or a cool mist humidifier, lozenges  For sore throat: saline gargles, honey herbal tea, lozenges, throat spray  To dry out your nose: try an antihistamine like loratadine (non-sedating) or diphenhydramine (sedating) or others To relieve a stuffy nose: try an oral decongestant  Like pseudoephedrine if you are under the age of 5028nd do not have high blood pressure, neti pot, flonase  To make blowing your nose easier: guaifenesin

## 2018-12-20 NOTE — Progress Notes (Signed)
Name: Amanda Fields   MRN: [id number]    DOB: Jan 08, 1976   Date:12/20/2018       Progress Note  Subjective  Chief Complaint  Chief Complaint  Patient presents with  . Influenza    flu like sx x 2 days.  . Fatigue  . Cough  . Facial Pain  . Ear Problem    itchying    HPI  Patient endorses flu like symptoms for 2 days- fatigue, cough, facial pain, ear itching, sore throat and irritation.   Patient Active Problem List   Diagnosis Date Noted  . Hypertension due to endocrine disorder 12/10/2018  . ASCUS of cervix with negative high risk HPV in 10/2017 and 10/2018 11/13/2018  . Pre-diabetes 09/27/2018  . Prolapsed internal hemorrhoids, grade 2 08/28/2018  . History of endometriosis 04/10/2018  . GAD (generalized anxiety disorder) 04/10/2018  . Mild recurrent major depression ([place]) 04/10/2018  . Obesity (BMI 30.0-34.9) 04/10/2018  . ADHD (attention deficit hyperactivity disorder), 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  . Family history of malignant neoplasm of gastrointestinal tract   . 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 Allergic rhinitis 02/04/2016  . Bilateral leg edema 04/09/2012    Past Medical History:  Diagnosis Date  . ADHD (attention deficit hyperactivity disorder), 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  . Adrenal mass, left ([place])   . Anemia    none since Hysterectomy done-related to heavy menses  . Asthma    Related to allergies"wheezes with pollen exposure" no asthma attacks  . Breast lipoma 09/06/2011   resolved- no surgery-tx. medically.  . Cough due to ACE inhibitor   . Dizziness   . Edema leg   . Elevated troponin I level 12/31/2015  . Endometriosis   . Hypertension    a. echo 03/2012 EF 60-65%, moderate LVH, nl LV diastolic fxn, nl PASP; b. echo 12/2015: EF 55-60%  . Hypertensive urgency 12/31/2015  . Migraine headache    migrianes-less frequent now  . Obesity   . Palpitation     Past Surgical History:  Procedure Laterality Date  . COLONOSCOPY WITH PROPOFOL N/A 04/09/2017    Procedure: COLONOSCOPY WITH PROPOFOL;  Surgeon: Lucilla Lame, MD;  Location: [facility];  Service: Endoscopy;  Laterality: N/A;  LATEX sensitivity  . IR GENERIC HISTORICAL  07/07/2016   IR VENOGRAM RENAL UNI LEFT 07/07/2016 Aletta Edouard, MD [facility]  . IR GENERIC HISTORICAL  07/07/2016   IR VENOUS SAMPLING 07/07/2016 Aletta Edouard, MD [facility]  . IR GENERIC HISTORICAL  07/07/2016   IR VENOGRAM ADRENAL BI 07/07/2016 Aletta Edouard, MD [facility]  . IR GENERIC HISTORICAL  07/07/2016   IR US GUIDE VASC ACCESS RIGHT 07/07/2016 Aletta Edouard, MD [facility]  . IR GENERIC HISTORICAL  07/07/2016   IR ANGIOGRAM SELECTIVE EACH ADDITIONAL VESSEL 07/07/2016 Aletta Edouard, MD [facility]  . IR GENERIC HISTORICAL  07/07/2016   IR VENOUS SAMPLING 07/07/2016 Aletta Edouard, MD [facility]  . IR GENERIC HISTORICAL  07/07/2016   IR US GUIDE VASC ACCESS RIGHT 07/07/2016 Aletta Edouard, MD [facility]  . LAPAROSCOPIC SUPRACERVICAL HYSTERECTOMY  09/27/2010   For endometriosis. Ovaries and tubes are in place.  Marland Kitchen NASAL SEPTUM SURGERY    . ROBOTIC ADRENALECTOMY Left 10/27/2016   Procedure: XI ROBOTIC LEFT ADRENALECTOMY;  Surgeon: Michael Boston, MD;  Location: [facility];  Service: General;  Laterality: Left;  . TONSILLECTOMY    . TUBAL LIGATION  2006  . WISDOM TOOTH EXTRACTION      Social History   Tobacco Use  .  Smoking status: Never Smoker  . Smokeless tobacco: Never Used  Substance Use Topics  . Alcohol use: Yes    Comment: social (1 drink/mo)     Current Outpatient Medications:  .  azelastine (ASTELIN) 0.1 % nasal spray, Place 2 sprays into both nostrils 2 (two) times daily. Use in each nostril as directed, Disp: 30 mL, Rfl: 0 .  cloNIDine (CATAPRES) 0.1 MG tablet, Take 1 tablet (0.1 mg total) by mouth at bedtime., Disp: 90 tablet, Rfl: 0 .  escitalopram (LEXAPRO) 10 MG tablet, Take 1 tablet (10 mg total) by mouth daily., Disp: 30 tablet, Rfl: 1 .  hydrochlorothiazide  (HYDRODIURIL) 12.5 MG tablet, Take 1 tablet (12.5 mg total) by mouth daily., Disp: 30 tablet, Rfl: 0 .  methylphenidate 27 MG PO CR tablet, Take 1 tablet (27 mg total) by mouth daily. Fill Nov 7th, 2019, Disp: 30 tablet, Rfl: 0 .  metroNIDAZOLE (FLAGYL) 500 MG tablet, Take 1 tablet (500 mg total) by mouth 2 (two) times daily., Disp: 14 tablet, Rfl: 0 .  oseltamivir (TAMIFLU) 75 MG capsule, Take 1 capsule (75 mg total) by mouth daily., Disp: 10 capsule, Rfl: 0 .  predniSONE (DELTASONE) 20 MG tablet, Take 1 tablet (20 mg total) by mouth 2 (two) times daily with a meal., Disp: 2 tablet, Rfl: 0 .  promethazine-dextromethorphan (PROMETHAZINE-DM) 6.25-15 MG/5ML syrup, Take 2.5 mLs by mouth 4 (four) times daily as needed for cough., Disp: 118 mL, Rfl: 0 .  topiramate (TOPAMAX) 25 MG tablet, Take 1-4 tablets (25-100 mg total) by mouth every evening. Go up by 1 tab every 3 days to max of 4 at night, Disp: 84 tablet, Rfl: 0  Allergies  Allergen Reactions  . Ace Inhibitors Cough  . Latex Itching    Gloves, condoms    ROS    No other specific complaints in a complete review of systems (except as listed in HPI above).  Objective  Vitals:   12/20/18 1212  BP: 112/72  Pulse: 99  Resp: 16  Temp: 99.6 F (37.6 C)  TempSrc: Oral  SpO2: 98%  Weight: 206 lb 8 oz (93.7 kg)  Height: 5' 7"  (1.702 m)     Body mass index is 32.34 kg/m.  Nursing Note and Vital Signs reviewed.  Physical Exam Constitutional:      Appearance: Normal appearance. She is well-developed.  HENT:     Head: Normocephalic and atraumatic.     Right Ear: Hearing, tympanic membrane, ear canal and external ear normal.     Left Ear: Hearing, tympanic membrane, ear canal and external ear normal.     Nose: Rhinorrhea present.     Mouth/Throat:     Mouth: Mucous membranes are moist.     Pharynx: Posterior oropharyngeal erythema present.  Eyes:     Conjunctiva/sclera: Conjunctivae normal.  Cardiovascular:     Rate and  Rhythm: Normal rate and regular rhythm.     Heart sounds: Normal heart sounds.  Pulmonary:     Effort: Pulmonary effort is normal.     Breath sounds: Normal breath sounds.  Musculoskeletal: Normal range of motion.  Neurological:     Mental Status: She is alert and oriented to person, place, and time.  Psychiatric:        Speech: Speech normal.        Behavior: Behavior normal. Behavior is cooperative.        Thought Content: Thought content normal.        Judgment: Judgment normal.  Results for orders placed or performed in visit on 12/20/18 (from the past 48 hour(s))  POCT Influenza A/B     Status: Abnormal   Collection Time: 12/20/18 12:11 PM  Result Value Ref Range   Influenza A, POC Positive (A) Negative   Influenza B, POC Negative Negative    Assessment & Plan  1. Influenza A - oseltamivir (TAMIFLU) 75 MG capsule; Take 1 capsule (75 mg total) by mouth daily.  Dispense: 10 capsule; Refill: 0 - promethazine-dextromethorphan (PROMETHAZINE-DM) 6.25-15 MG/5ML syrup; Take 2.5 mLs by mouth 4 (four) times daily as needed for cough.  Dispense: 118 mL; Refill: 0  2. Exposure to the flu - POCT Influenza A/B  3. Cough - promethazine-dextromethorphan (PROMETHAZINE-DM) 6.25-15 MG/5ML syrup; Take 2.5 mLs by mouth 4 (four) times daily as needed for cough.  Dispense: 118 mL; Refill: 0    See AVS

## 2018-12-23 ENCOUNTER — Inpatient Hospital Stay: Admission: RE | Admit: 2018-12-23 | Payer: Medicaid Other | Source: Ambulatory Visit

## 2018-12-27 ENCOUNTER — Other Ambulatory Visit: Payer: Self-pay | Admitting: Family Medicine

## 2018-12-27 DIAGNOSIS — G43001 Migraine without aura, not intractable, with status migrainosus: Secondary | ICD-10-CM

## 2018-12-30 ENCOUNTER — Ambulatory Visit
Admission: RE | Admit: 2018-12-30 | Discharge: 2018-12-30 | Disposition: A | Discharge status: Home / Self Care | Payer: Managed Care, Other (non HMO) | Source: Ambulatory Visit | Attending: Obstetrics & Gynecology | Admitting: Obstetrics & Gynecology

## 2018-12-30 DIAGNOSIS — Z1231 Encounter for screening mammogram for malignant neoplasm of breast: Secondary | ICD-10-CM | POA: Diagnosis not present

## 2019-01-02 ENCOUNTER — Other Ambulatory Visit: Payer: Self-pay | Admitting: Family Medicine

## 2019-01-02 DIAGNOSIS — I152 Hypertension secondary to endocrine disorders: Secondary | ICD-10-CM

## 2019-01-03 ENCOUNTER — Other Ambulatory Visit: Payer: Self-pay

## 2019-01-03 DIAGNOSIS — R002 Palpitations: Secondary | ICD-10-CM

## 2019-01-03 DIAGNOSIS — I10 Essential (primary) hypertension: Secondary | ICD-10-CM

## 2019-01-03 DIAGNOSIS — G43001 Migraine without aura, not intractable, with status migrainosus: Secondary | ICD-10-CM

## 2019-01-03 NOTE — Telephone Encounter (Addendum)
Refill request for general medication: Topamax 25 mg  Last office visit: 12/20/2018  Last physical exam: None indicated  Follow-ups on file. 01/16/2019  Patient states that she is taking 4 tablets. She did go up on her dose to the max dose.   She also reports that she continues to take her Metoprolol for palpitations. She tried to get this refilled and pharmacist states that it was discontinued. Please advise or refill.

## 2019-01-05 ENCOUNTER — Other Ambulatory Visit: Payer: Self-pay | Admitting: Family Medicine

## 2019-01-05 DIAGNOSIS — N951 Menopausal and female climacteric states: Secondary | ICD-10-CM

## 2019-01-05 DIAGNOSIS — F339 Major depressive disorder, recurrent, unspecified: Secondary | ICD-10-CM

## 2019-01-05 DIAGNOSIS — R0981 Nasal congestion: Secondary | ICD-10-CM

## 2019-01-05 DIAGNOSIS — F411 Generalized anxiety disorder: Secondary | ICD-10-CM

## 2019-01-05 NOTE — Telephone Encounter (Signed)
Did she go up on the dose? How many mg is she taking ?

## 2019-01-06 ENCOUNTER — Other Ambulatory Visit: Payer: Self-pay | Admitting: Family Medicine

## 2019-01-06 DIAGNOSIS — R002 Palpitations: Secondary | ICD-10-CM

## 2019-01-06 DIAGNOSIS — I10 Essential (primary) hypertension: Secondary | ICD-10-CM

## 2019-01-06 MED ORDER — METOPROLOL SUCCINATE ER 50 MG PO TB24: 50.0000 mg | ORAL_TABLET | Freq: Every day | ORAL | 0 refills | Status: DC

## 2019-01-06 MED ORDER — TOPIRAMATE 100 MG PO TABS: 100.0000 mg | ORAL_TABLET | Freq: Every evening | ORAL | 0 refills | Status: DC

## 2019-01-08 ENCOUNTER — Other Ambulatory Visit: Payer: Self-pay | Admitting: Family Medicine

## 2019-01-08 DIAGNOSIS — I152 Hypertension secondary to endocrine disorders: Secondary | ICD-10-CM

## 2019-01-09 ENCOUNTER — Other Ambulatory Visit: Payer: Self-pay | Admitting: Family Medicine

## 2019-01-09 DIAGNOSIS — R0981 Nasal congestion: Secondary | ICD-10-CM

## 2019-01-15 ENCOUNTER — Other Ambulatory Visit: Payer: Self-pay

## 2019-01-15 NOTE — Telephone Encounter (Signed)
Refill request for Hypertension medication:  HCTZ 12.5 mg  Last office visit pertaining to hypertension: 12/20/2018   BP Readings from Last 3 Encounters:  12/20/18 112/72  12/10/18 106/60  11/11/18 105/72     Lab Results  Component Value Date   CREATININE 1.15 (H) 09/26/2018   BUN 13 09/26/2018   NA 138 09/26/2018   K 3.8 09/26/2018   CL 101 09/26/2018   CO2 30 09/26/2018     Follow-ups on file. Tomorrow.

## 2019-01-16 ENCOUNTER — Ambulatory Visit: Payer: 59 | Admitting: Family Medicine

## 2019-01-16 ENCOUNTER — Encounter: Payer: Self-pay | Admitting: Family Medicine

## 2019-01-16 VITALS — BP 100/80 | HR 72 | Temp 98.2°F | Resp 16 | Ht 67.0 in | Wt 203.6 lb

## 2019-01-16 DIAGNOSIS — E896 Postprocedural adrenocortical (-medullary) hypofunction: Secondary | ICD-10-CM | POA: Diagnosis not present

## 2019-01-16 DIAGNOSIS — N951 Menopausal and female climacteric states: Secondary | ICD-10-CM | POA: Diagnosis not present

## 2019-01-16 DIAGNOSIS — F900 Attention-deficit hyperactivity disorder, predominantly inattentive type: Secondary | ICD-10-CM | POA: Diagnosis not present

## 2019-01-16 DIAGNOSIS — G43001 Migraine without aura, not intractable, with status migrainosus: Secondary | ICD-10-CM

## 2019-01-16 DIAGNOSIS — F902 Attention-deficit hyperactivity disorder, combined type: Secondary | ICD-10-CM

## 2019-01-16 DIAGNOSIS — F411 Generalized anxiety disorder: Secondary | ICD-10-CM

## 2019-01-16 DIAGNOSIS — F339 Major depressive disorder, recurrent, unspecified: Secondary | ICD-10-CM

## 2019-01-16 DIAGNOSIS — F5104 Psychophysiologic insomnia: Secondary | ICD-10-CM

## 2019-01-16 DIAGNOSIS — R002 Palpitations: Secondary | ICD-10-CM

## 2019-01-16 MED ORDER — ATENOLOL 25 MG PO TABS: 25.0000 mg | ORAL_TABLET | Freq: Every day | ORAL | 3 refills | Status: DC | PRN

## 2019-01-16 MED ORDER — METHYLPHENIDATE HCL ER (OSM) 27 MG PO TBCR: 27.0000 mg | EXTENDED_RELEASE_TABLET | ORAL | 0 refills | Status: DC

## 2019-01-16 MED ORDER — CLONIDINE HCL 0.1 MG PO TABS: 0.1000 mg | ORAL_TABLET | Freq: Every day | ORAL | 0 refills | Status: DC

## 2019-01-16 MED ORDER — ESCITALOPRAM OXALATE 10 MG PO TABS: 10.0000 mg | ORAL_TABLET | Freq: Every day | ORAL | 0 refills | Status: DC

## 2019-01-16 MED ORDER — METHYLPHENIDATE HCL ER (OSM) 27 MG PO TBCR: 27.0000 mg | EXTENDED_RELEASE_TABLET | Freq: Every day | ORAL | 0 refills | Status: DC

## 2019-01-16 MED ORDER — TOPIRAMATE 100 MG PO TABS: 100.0000 mg | ORAL_TABLET | Freq: Every evening | ORAL | 0 refills | Status: DC

## 2019-01-16 NOTE — Progress Notes (Signed)
Name: Amanda Fields   MRN: [id number]    DOB: October 08, 1976   Date:01/16/2019       Progress Note  Subjective  Chief Complaint  Chief Complaint  Patient presents with  . Hypertension  . Prediabetes  . Anxiety  . 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 Discussed stopping hctz and taking atenolol prn from now on   ADD: she has been on Concerta in the past 3 years, she denies side effects of medication, diagnosed by previous pcp with a screening test. She has been able to focus and is more productive at work, no side effects of medication  Pre-diabetes: she has changed her diet since last visit when hgbA1C was 6.2% , she denies polyphagia, polydipsia or polyuria. She lost another 4 lbs in the past month and is doing well. She does not want medications at this time, we will recheck in 3 months.   Major Depression and Anxiety: she was feeling better on Lexapro however over the past 3 weeks stress has been higher and phq 9 today is up to 16. She states she moved in with her mother back in Oct to help her financially and was going well, however 3 weeks ago her mother allowed her nephew and his girlfriend to move in with them and has caused a lot of conflict, her 110 yo son is also staying more in his room and she decided she will move out soon.   Migraine headaches: she used to see migraine specialist, took topamax in the past, she was having a throbbing headaches daily again and we resumed Topamax in Dec and no headaches since.   Patient Active Problem List   Diagnosis Date Noted  . Hypertension due to endocrine  disorder 12/10/2018  . ASCUS of cervix with negative high risk HPV in 10/2017 and 10/2018 11/13/2018  . Pre-diabetes 09/27/2018  . Prolapsed internal hemorrhoids, grade 2 08/28/2018  . History of endometriosis 04/10/2018  . GAD (generalized anxiety disorder) 04/10/2018  . Mild recurrent major depression ([place]) 04/10/2018  . Obesity (BMI 30.0-34.9) 04/10/2018  . ADHD (attention deficit hyperactivity disorder), 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  . Family history of malignant neoplasm of gastrointestinal tract   . 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 Allergic rhinitis 02/04/2016  . Bilateral leg edema 04/09/2012    Past Surgical History:  Procedure Laterality Date  . COLONOSCOPY WITH PROPOFOL N/A 04/09/2017   Procedure: COLONOSCOPY WITH PROPOFOL;  Surgeon: Lucilla Lame, MD;  Location: [facility];  Service: Endoscopy;  Laterality: N/A;  LATEX sensitivity  . IR GENERIC HISTORICAL  07/07/2016   IR VENOGRAM RENAL UNI LEFT 07/07/2016 Aletta Edouard, MD [facility]  . IR GENERIC HISTORICAL  07/07/2016   IR VENOUS SAMPLING 07/07/2016 Aletta Edouard, MD [facility]  . IR GENERIC HISTORICAL  07/07/2016   IR VENOGRAM ADRENAL BI 07/07/2016 Aletta Edouard, MD [facility]  . IR GENERIC HISTORICAL  07/07/2016   IR US GUIDE VASC ACCESS RIGHT 07/07/2016 Aletta Edouard, MD [facility]  . IR GENERIC HISTORICAL  07/07/2016   IR ANGIOGRAM SELECTIVE EACH ADDITIONAL VESSEL 07/07/2016 Aletta Edouard, MD [facility]  . IR GENERIC HISTORICAL  07/07/2016   IR VENOUS SAMPLING 07/07/2016 Aletta Edouard, MD [facility]  . IR GENERIC HISTORICAL  07/07/2016   IR US GUIDE VASC ACCESS RIGHT 07/07/2016 Aletta Edouard, MD [facility]  . LAPAROSCOPIC SUPRACERVICAL HYSTERECTOMY  09/27/2010   For endometriosis. Ovaries and tubes are in place.  Marland Kitchen NASAL SEPTUM SURGERY    . ROBOTIC ADRENALECTOMY Left 10/27/2016   Procedure: XI ROBOTIC LEFT ADRENALECTOMY;  Surgeon: Michael Boston, MD;  Location: [facility];  Service: General;   Laterality: Left;  . TONSILLECTOMY    . TUBAL LIGATION  2006  . WISDOM TOOTH EXTRACTION      Family History  Problem Relation Age of Onset  . COPD Mother   . Hypotension Mother   . Colon cancer Father   . Emphysema Father   . Cancer Father        colon  . Hyperlipidemia Sister   . Heart failure Paternal Uncle   . Heart attack Cousin 32  . Heart disease Cousin        heart attack  . Breast cancer Maternal Aunt   . Breast cancer Other   . Adrenal disorder Neg Hx     Social History   Socioeconomic History  . Marital status: Single    Spouse name: Not on file  . Number of children: 2  . Years of education: Not on file  . Highest education level: Bachelor's degree (e.g., BA, AB, BS)  Occupational History  . Occupation: microbiology     Employer: [employer]    Comment: [employer]   . Occupation: aplication review    Employer: [employer]    Comment: graduate school   Social Needs  . Financial resource strain: Not hard at all  . Food insecurity:    Worry: Never true    Inability: Never true  . Transportation needs:    Medical: No    Non-medical: No  Tobacco Use  . Smoking status: Never Smoker  . Smokeless tobacco: Never Used  Substance and Sexual Activity  . Alcohol use: Yes    Comment: social (1 drink/mo)  . Drug use: No  . Sexual activity: Yes    Partners: Male    Birth control/protection: Surgical, Condom  Lifestyle  . Physical activity:    Days per week: 7 days    Minutes per session: 90 min  . Stress: Not at all  Relationships  . Social connections:    Talks on phone: More than three times a week    Gets together: More than three times a week    Attends religious service: More than 4 times per year    Active member of club or organization: Yes    Attends meetings of clubs or organizations: 1 to 4 times per year    Relationship status: Never married  . Intimate partner violence:    Fear of current or ex partner: No    Emotionally abused: No    Physically abused:  No    Forced sexual activity: No  Other Topics Concern  . Not on file  Social History Narrative   Has one daughter in college, graduating in 2020 one son at home   Working two jobs, one at night and one during the day. She sleeps from 3 pm to 8:45 pm      Current Outpatient Medications:  .  cloNIDine (CATAPRES) 0.1 MG tablet, Take 1 tablet (0.1 mg total) by mouth at bedtime., Disp: 90 tablet, Rfl:  0 .  escitalopram (LEXAPRO) 10 MG tablet, Take 1 tablet (10 mg total) by mouth daily., Disp: 30 tablet, Rfl: 1 .  hydrochlorothiazide (HYDRODIURIL) 12.5 MG tablet, TAKE 1 TABLET BY MOUTH EVERY DAY, Disp: 30 tablet, Rfl: 0 .  metoprolol succinate (TOPROL-XL) 50 MG 24 hr tablet, TAKE 1 TABLET BY MOUTH EVERY DAY, Disp: 30 tablet, Rfl: 0 .  predniSONE (DELTASONE) 20 MG tablet, Take 1 tablet (20 mg total) by mouth 2 (two) times daily with a meal., Disp: 2 tablet, Rfl: 0 .  topiramate (TOPAMAX) 100 MG tablet, Take 1 tablet (100 mg total) by mouth every evening., Disp: 30 tablet, Rfl: 0 .  azelastine (ASTELIN) 0.1 % nasal spray, PLACE 2 SPRAYS INTO BOTH NOSTRILS 2 (TWO) TIMES DAILY. USE IN EACH NOSTRIL AS DIRECTED (Patient not taking: Reported on 01/16/2019), Disp: 30 mL, Rfl: 0 .  methylphenidate 27 MG PO CR tablet, Take 1 tablet (27 mg total) by mouth daily. Fill Nov 7th, 2019, Disp: 30 tablet, Rfl: 0 .  metroNIDAZOLE (FLAGYL) 500 MG tablet, Take 1 tablet (500 mg total) by mouth 2 (two) times daily. (Patient not taking: Reported on 01/16/2019), Disp: 14 tablet, Rfl: 0 .  oseltamivir (TAMIFLU) 75 MG capsule, Take 1 capsule (75 mg total) by mouth daily. (Patient not taking: Reported on 01/16/2019), Disp: 10 capsule, Rfl: 0 .  promethazine-dextromethorphan (PROMETHAZINE-DM) 6.25-15 MG/5ML syrup, Take 2.5 mLs by mouth 4 (four) times daily as needed for cough. (Patient not taking: Reported on 01/16/2019), Disp: 118 mL, Rfl: 0  Allergies  Allergen Reactions  . Ace Inhibitors Cough  . Latex Itching    Gloves,  condoms    I personally reviewed active problem list, medication list, allergies, family history, social history with the patient/caregiver today.   ROS  Constitutional: Negative for fever or weight change.  Respiratory: Negative for cough and shortness of breath.   Cardiovascular: Negative for chest pain or palpitations.  Gastrointestinal: Negative for abdominal pain, no bowel changes.  Musculoskeletal: Negative for gait problem or joint swelling.  Skin: Negative for rash.  Neurological: Negative for dizziness , positive for headache intermittently .  No other specific complaints in a complete review of systems (except as listed in HPI above).   Objective  Vitals:   01/16/19 1036  BP: 100/80  Pulse: 72  Resp: 16  Temp: 98.2 F (36.8 C)  TempSrc: Oral  SpO2: 98%  Weight: 203 lb 9.6 oz (92.4 kg)  Height: 5' 7"  (1.702 m)    Body mass index is 31.89 kg/m.  Physical Exam  Constitutional: Patient appears well-developed and well-nourished. Obese No distress.  HEENT: head atraumatic, normocephalic, pupils equal and reactive to light,  neck supple, throat within normal limits Cardiovascular: Normal rate, regular rhythm and normal heart sounds.  No murmur heard. No BLE edema. Pulmonary/Chest: Effort normal and breath sounds normal. No respiratory distress. Abdominal: Soft.  There is no tenderness. Psychiatric: Patient has a normal mood and affect. behavior is normal. Judgment and thought content normal.  Recent Results (from the past 2160 hour(s))  POCT Urinalysis Dipstick     Status: Normal   Collection Time: 11/11/18  4:26 PM  Result Value Ref Range   Color, UA     Clarity, UA     Glucose, UA     Bilirubin, UA     Ketones, UA     Spec Grav, UA     Blood, UA negative    pH, UA     Protein, UA  Urobilinogen, UA     Nitrite, UA     Leukocytes, UA Negative Negative   Appearance     Odor    Urine Culture     Status: None   Collection Time: 11/11/18  4:30 PM   Result Value Ref Range   Urine Culture, Routine Final report    Organism ID, Bacteria No growth   Pap IG and HPV (high risk) DNA detection     Status: None   Collection Time: 11/11/18  4:30 PM  Result Value Ref Range   DIAGNOSIS: Comment     Comment: NEGATIVE FOR INTRAEPITHELIAL LESION OR MALIGNANCY.   Specimen adequacy: Comment     Comment: Satisfactory for evaluation. Endocervical and/or squamous metaplastic cells (endocervical component) are present.    Clinician Provided ICD10 Comment     Comment: Z01.419   Performed by: Comment     Comment: Lewanda Rife, Cytotechnologist (ASCP)   PAP Smear Comment .    Note: Comment     Comment: The Pap smear is a screening test designed to aid in the detection of premalignant and malignant conditions of the uterine cervix.  It is not a diagnostic procedure and should not be used as the sole means of detecting cervical cancer.  Both false-positive and false-negative reports do occur.    Test Methodology Comment     Comment: This liquid based ThinPrep(R) pap test was screened with the use of an image guided system.    HPV, high-risk Negative Negative    Comment: This nucleic acid amplification high-risk HPV test detects thirteen high-risk types (16,18,31,33,35,39,45,51,52,56,58,59,68) without differentiation.   NuSwab VG+, Candida 6sp     Status: None   Collection Time: 11/11/18  4:30 PM  Result Value Ref Range   Atopobium vaginae Low - 0 Score   BVAB 2 Low - 0 Score   Megasphaera 1 Low - 0 Score    Comment: Calculate total score by adding the 3 individual bacterial vaginosis (BV) marker scores together.  Total score is interpreted as follows: Total score 0-1: Indicates the absence of BV. Total score   2: Indeterminate for BV. Additional clinical                  data should be evaluated to establish a                  diagnosis. Total score 3-6: Indicates the presence of BV. This test was developed and its performance  characteristics determined by [facility].  It has not been cleared or approved by the Food and Drug Administration.  The FDA has determined that such clearance or approval is not necessary.    Candida albicans, NAA Negative Negative   Candida glabrata, NAA Negative Negative    Comment: This test was developed and its performance characteristics determined by [facility].  It has not been cleared or approved by the Food and Drug Administration.  The FDA has determined that such clearance or approval is not necessary.    Candida tropicalis, NAA Negative Negative   Candida parapsilosis, NAA Negative Negative   Candida lusitaniae, NAA Negative Negative   Candida krusei, NAA Negative Negative    Comment: This test was developed and its performance characteristics determined by [facility].  It has not been cleared or approved by the Food and Drug Administration.  The FDA has determined that such clearance or approval is not necessary.    Trich vag by NAA Negative Negative   Chlamydia trachomatis, NAA Negative Negative  Neisseria gonorrhoeae, NAA Negative Negative  Cervicovaginal ancillary only     Status: Abnormal   Collection Time: 12/10/18 12:00 AM  Result Value Ref Range   Bacterial vaginitis **POSITIVE for Gardnerella vaginalis** (A)     Comment: Normal Reference Range - Negative   Candida vaginitis Negative for Candida species     Comment: Normal Reference Range - Negative   Chlamydia Negative     Comment: Normal Reference Range - Negative   Neisseria gonorrhea Negative     Comment: Normal Reference Range - Negative   Trichomonas Negative     Comment: Normal Reference Range - Negative  POCT Influenza A/B     Status: Abnormal   Collection Time: 12/20/18 12:11 PM  Result Value Ref Range   Influenza A, POC Positive (A) Negative   Influenza B, POC Negative Negative      PHQ2/9: Depression screen [facility] 2/9 01/16/2019 12/10/2018 09/26/2018 08/28/2018 06/27/2018  Decreased Interest 1 0 2 0  2  Down, Depressed, Hopeless 1 0 1 0 0  PHQ - 2 Score 2 0 3 0 2  Altered sleeping 3 3 3  0 2  Tired, decreased energy 3 3 2  0 0  Change in appetite 3 0 3 0 2  Feeling bad or failure about yourself  1 0 3 0 0  Trouble concentrating 2 0 2 0 1  Moving slowly or fidgety/restless 2 0 2 0 0  Suicidal thoughts 0 0 1 0 0  PHQ-9 Score 16 6 19  0 7  Difficult doing work/chores - Somewhat difficult - Not difficult at all Somewhat difficult  Some recent data might be hidden     Fall Risk: Fall Risk  01/16/2019 09/26/2018 08/28/2018 02/01/2018 11/22/2017  Falls in the past year? 0 No No No No  Number falls in past yr: 0 - - - -  Injury with Fall? 0 - - - -  Follow up - - - - -     Assessment & Plan  1. Migraine without aura and with status migrainosus, not intractable  - topiramate (TOPAMAX) 100 MG tablet; Take 1 tablet (100 mg total) by mouth every evening.  Dispense: 90 tablet; Refill: 0  2. ADHD (attention deficit hyperactivity disorder), inattentive type  - methylphenidate 27 MG PO CR tablet; Take 1 tablet (27 mg total) by mouth daily.  Dispense: 30 tablet; Refill: 0 - methylphenidate 27 MG PO CR tablet; Take 1 tablet (27 mg total) by mouth every morning.  Dispense: 30 tablet; Refill: 0 - methylphenidate 27 MG PO CR tablet; Take 1 tablet (27 mg total) by mouth every morning. Fill March 17, 2019  Dispense: 30 tablet; Refill: 0  3. Attention deficit hyperactivity disorder (ADHD), combined type  - cloNIDine (CATAPRES) 0.1 MG tablet; Take 1 tablet (0.1 mg total) by mouth at bedtime.  Dispense: 90 tablet; Refill: 0  4. Psychophysiological insomnia  - cloNIDine (CATAPRES) 0.1 MG tablet; Take 1 tablet (0.1 mg total) by mouth at bedtime.  Dispense: 90 tablet; Refill: 0  5. Hot flashes due to menopause  - escitalopram (LEXAPRO) 10 MG tablet; Take 1 tablet (10 mg total) by mouth daily.  Dispense: 90 tablet; Refill: 0  6. Major depression, recurrent, chronic (HCC)  - escitalopram (LEXAPRO)  10 MG tablet; Take 1 tablet (10 mg total) by mouth daily.  Dispense: 90 tablet; Refill: 0  7. GAD (generalized anxiety disorder)  - escitalopram (LEXAPRO) 10 MG tablet; Take 1 tablet (10 mg total) by mouth daily.  Dispense: 90  tablet; Refill: 0  8. Palpitation  - atenolol (TENORMIN) 25 MG tablet; Take 1 tablet (25 mg total) by mouth daily as needed.  Dispense: 90 tablet; Refill: 3  9. 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 We will change to Atenolol 25 mg to take prn only if heart rate does not resolve

## 2019-02-06 ENCOUNTER — Other Ambulatory Visit: Payer: Self-pay | Admitting: Family Medicine

## 2019-02-06 DIAGNOSIS — R002 Palpitations: Secondary | ICD-10-CM

## 2019-02-06 DIAGNOSIS — G43001 Migraine without aura, not intractable, with status migrainosus: Secondary | ICD-10-CM

## 2019-02-06 DIAGNOSIS — I10 Essential (primary) hypertension: Secondary | ICD-10-CM

## 2019-02-06 NOTE — Telephone Encounter (Signed)
Sent to wrong pharmacy [facility]) please send to [facility] [address].

## 2019-02-18 ENCOUNTER — Other Ambulatory Visit: Payer: Self-pay | Admitting: Nurse Practitioner

## 2019-02-18 ENCOUNTER — Encounter: Payer: Self-pay | Admitting: Nurse Practitioner

## 2019-02-18 ENCOUNTER — Ambulatory Visit (INDEPENDENT_AMBULATORY_CARE_PROVIDER_SITE_OTHER): Payer: 59 | Admitting: Nurse Practitioner

## 2019-02-18 VITALS — BP 130/76 | HR 94 | Temp 98.6°F | Resp 16 | Ht 67.0 in | Wt 197.3 lb

## 2019-02-18 DIAGNOSIS — T3695XA Adverse effect of unspecified systemic antibiotic, initial encounter: Secondary | ICD-10-CM

## 2019-02-18 DIAGNOSIS — J0141 Acute recurrent pansinusitis: Secondary | ICD-10-CM

## 2019-02-18 DIAGNOSIS — B379 Candidiasis, unspecified: Secondary | ICD-10-CM

## 2019-02-18 DIAGNOSIS — R040 Epistaxis: Secondary | ICD-10-CM

## 2019-02-18 DIAGNOSIS — I10 Essential (primary) hypertension: Secondary | ICD-10-CM | POA: Diagnosis not present

## 2019-02-18 MED ORDER — LEVOCETIRIZINE DIHYDROCHLORIDE 5 MG PO TABS: 5.0000 mg | ORAL_TABLET | Freq: Every evening | ORAL | 0 refills | Status: DC

## 2019-02-18 MED ORDER — FLUCONAZOLE 150 MG PO TABS: 150.0000 mg | ORAL_TABLET | Freq: Once | ORAL | 0 refills | Status: AC

## 2019-02-18 MED ORDER — FLUTICASONE PROPIONATE 50 MCG/ACT NA SUSP: 2.0000 | Freq: Every day | NASAL | 6 refills | Status: DC

## 2019-02-18 MED ORDER — AMOXICILLIN-POT CLAVULANATE 875-125 MG PO TABS: 1.0000 | ORAL_TABLET | Freq: Two times a day (BID) | ORAL | 0 refills | Status: DC

## 2019-02-18 MED ORDER — MONTELUKAST SODIUM 10 MG PO TABS: 10.0000 mg | ORAL_TABLET | Freq: Every day | ORAL | 0 refills | Status: DC

## 2019-02-18 NOTE — Patient Instructions (Signed)
Please take augmentin twice a day with food for the ENTIRE 10 days. Please take your antibiotic as prescribed with food on your stomach to prevent nausea and upset stomach. Take the antibiotic for the entire course, even if your symptoms resolve before the course if completed. Using antibiotics inappropriately can make it harder to treat future infections. If you have any concerning side effects please stop the medication and let us know immediately. I do recommend taking a probiotic to replenish your good gut health anytime you take an antibiotic. You can get probiotics in types of yogurt like activia, or other food/drinks such as kimchi, kombucha , sauerkraut, or you can take an over the counter supplement.   - Flonase, saline nasal spray, daily anti-histamine  -The following OTC medicines increase BP Avoid: phenylephrine, phenylpropanolamine, and pseudoephredine

## 2019-02-18 NOTE — Progress Notes (Signed)
Name: Amanda Fields   MRN: [id number]    DOB: Apr 10, 1976   Date:02/18/2019       Progress Note  Subjective  Chief Complaint  Chief Complaint  Patient presents with  . Cough    Onset-3 weeks  . Sinusitis    Sinus pressure and discomfort   . Epistaxis  . Ear Pain    Bilateral ear pressure and pain    HPI  Patient presets with sinus congestion in cheeks and behind eyes ongoing for three weeks. Has been taking mucinex, alkaseltzer cold and sinus, dayquil, ibuprofen, sudafed, flonase. States has bilateral ear pressure and pain. States had epistaxis episode this morning- resolved. Has had to have her nose cauterized in the past.   Patient Active Problem List   Diagnosis Date Noted  . Hypertension due to endocrine disorder 12/10/2018  . ASCUS of cervix with negative high risk HPV in 10/2017 and 10/2018 11/13/2018  . Pre-diabetes 09/27/2018  . Prolapsed internal hemorrhoids, grade 2 08/28/2018  . History of endometriosis 04/10/2018  . GAD (generalized anxiety disorder) 04/10/2018  . Mild recurrent major depression ([place]) 04/10/2018  . Obesity (BMI 30.0-34.9) 04/10/2018  . ADHD (attention deficit hyperactivity disorder), 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  . Family history of malignant neoplasm of gastrointestinal tract   . 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 Allergic rhinitis 02/04/2016  . Bilateral leg edema 04/09/2012    Past Medical History:  Diagnosis Date  . ADHD (attention deficit hyperactivity disorder), 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  . Adrenal mass, left ([place])   . Anemia    none since Hysterectomy done-related to heavy menses  . Asthma    Related to allergies"wheezes with pollen exposure" no asthma attacks  . Breast lipoma 09/06/2011   resolved- no surgery-tx. medically.  . Cough due to ACE inhibitor   . Dizziness   . Edema leg   . Elevated troponin I level 12/31/2015  . Endometriosis   . Hypertension    a. echo 03/2012 EF 60-65%, moderate LVH, nl LV diastolic fxn, nl PASP; b.  echo 12/2015: EF 55-60%  . Hypertensive urgency 12/31/2015  . Migraine headache    migrianes-less frequent now  . Obesity   . Palpitation     Past Surgical History:  Procedure Laterality Date  . COLONOSCOPY WITH PROPOFOL N/A 04/09/2017   Procedure: COLONOSCOPY WITH PROPOFOL;  Surgeon: Lucilla Lame, MD;  Location: [facility];  Service: Endoscopy;  Laterality: N/A;  LATEX sensitivity  . IR GENERIC HISTORICAL  07/07/2016   IR VENOGRAM RENAL UNI LEFT 07/07/2016 Aletta Edouard, MD [facility]  . IR GENERIC HISTORICAL  07/07/2016   IR VENOUS SAMPLING 07/07/2016 Aletta Edouard, MD [facility]  . IR GENERIC HISTORICAL  07/07/2016   IR VENOGRAM ADRENAL BI 07/07/2016 Aletta Edouard, MD [facility]  . IR GENERIC HISTORICAL  07/07/2016   IR US GUIDE VASC ACCESS RIGHT 07/07/2016 Aletta Edouard, MD [facility]  . IR GENERIC HISTORICAL  07/07/2016   IR ANGIOGRAM SELECTIVE EACH ADDITIONAL VESSEL 07/07/2016 Aletta Edouard, MD [facility]  . IR GENERIC HISTORICAL  07/07/2016   IR VENOUS SAMPLING 07/07/2016 Aletta Edouard, MD [facility]  . IR GENERIC HISTORICAL  07/07/2016   IR US GUIDE VASC ACCESS RIGHT 07/07/2016 Aletta Edouard, MD [facility]  . LAPAROSCOPIC SUPRACERVICAL HYSTERECTOMY  09/27/2010   For endometriosis. Ovaries and tubes are in place.  [facility] Kitchen NASAL SEPTUM SURGERY    . ROBOTIC ADRENALECTOMY Left 10/27/2016   Procedure: XI ROBOTIC LEFT ADRENALECTOMY;  Surgeon: Michael Boston, MD;  Location:  [facility];  Service: General;  Laterality: Left;  . TONSILLECTOMY    . TUBAL LIGATION  2006  . WISDOM TOOTH EXTRACTION      Social History   Tobacco Use  . Smoking status: Never Smoker  . Smokeless tobacco: Never Used  Substance Use Topics  . Alcohol use: Yes    Comment: social (1 drink/mo)     Current Outpatient Medications:  .  atenolol (TENORMIN) 25 MG tablet, Take 1 tablet (25 mg total) by mouth daily as needed., Disp: 90 tablet, Rfl: 3 .  azelastine (ASTELIN) 0.1 % nasal spray, ,  Disp: , Rfl:  .  cloNIDine (CATAPRES) 0.1 MG tablet, Take 1 tablet (0.1 mg total) by mouth at bedtime., Disp: 90 tablet, Rfl: 0 .  escitalopram (LEXAPRO) 10 MG tablet, Take 1 tablet (10 mg total) by mouth daily., Disp: 90 tablet, Rfl: 0 .  methylphenidate 27 MG PO CR tablet, Take 1 tablet (27 mg total) by mouth daily., Disp: 30 tablet, Rfl: 0 .  methylphenidate 27 MG PO CR tablet, Take 1 tablet (27 mg total) by mouth every morning., Disp: 30 tablet, Rfl: 0 .  methylphenidate 27 MG PO CR tablet, Take 1 tablet (27 mg total) by mouth every morning. Fill March 17, 2019, Disp: 30 tablet, Rfl: 0 .  metoprolol succinate (TOPROL-XL) 50 MG 24 hr tablet, TAKE 1 TABLET BY MOUTH EVERY DAY, Disp: 90 tablet, Rfl: 0 .  topiramate (TOPAMAX) 100 MG tablet, TAKE 1 TABLET (100 MG TOTAL) BY MOUTH EVERY EVENING., Disp: 90 tablet, Rfl: 0  Allergies  Allergen Reactions  . Ace Inhibitors Cough  . Latex Itching    Gloves, condoms    ROS   No other specific complaints in a complete review of systems (except as listed in HPI above).  Objective  Vitals:   02/18/19 1017  BP: 130/76  Pulse: 94  Resp: 16  Temp: 98.6 F (37 C)  TempSrc: Oral  SpO2: 98%  Weight: 197 lb 4.8 oz (89.5 kg)  Height: 5' 7"  (1.702 m)    Body mass index is 30.9 kg/m.  Nursing Note and Vital Signs reviewed.  Physical Exam Vitals signs reviewed.  Constitutional:      Appearance: She is well-developed.  HENT:     Head: Normocephalic and atraumatic.     Right Ear: Hearing, ear canal and external ear normal. A middle ear effusion is present.     Left Ear: Hearing, ear canal and external ear normal. A middle ear effusion is present.     Nose: Nasal tenderness, mucosal edema and congestion present.     Right Nostril: No epistaxis.     Left Nostril: No epistaxis.     Right Turbinates: Enlarged.     Left Turbinates: Enlarged.     Right Sinus: Maxillary sinus tenderness and frontal sinus tenderness present.     Left Sinus:  Maxillary sinus tenderness and frontal sinus tenderness present.     Mouth/Throat:     Mouth: Mucous membranes are moist.     Pharynx: Uvula midline. No oropharyngeal exudate or posterior oropharyngeal erythema.  Eyes:     General:        Right eye: No discharge.        Left eye: No discharge.     Conjunctiva/sclera: Conjunctivae normal.  Neck:     Musculoskeletal: Normal range of motion and neck supple.     Vascular: No carotid bruit.  Cardiovascular:     Rate and Rhythm: Normal rate.  Heart sounds: Normal heart sounds.  Pulmonary:     Effort: Pulmonary effort is normal.     Breath sounds: Normal breath sounds.  Lymphadenopathy:     Cervical: No cervical adenopathy.  Skin:    General: Skin is warm and dry.     Findings: No rash.  Neurological:     Mental Status: She is alert.     GCS: GCS eye subscore is 4. GCS verbal subscore is 5. GCS motor subscore is 6.  Psychiatric:        Mood and Affect: Mood normal.        Speech: Speech normal.        Behavior: Behavior normal.        Judgment: Judgment normal.        No results found for this or any previous visit (from the past 48 hour(s)).  Assessment & Plan  1. Acute recurrent pansinusitis - amoxicillin-clavulanate (AUGMENTIN) 875-125 MG tablet; Take 1 tablet by mouth 2 (two) times daily.  Dispense: 20 tablet; Refill: 0 - fluticasone (FLONASE) 50 MCG/ACT nasal spray; Place 2 sprays into both nostrils daily.  Dispense: 16 g; Refill: 6 - levocetirizine (XYZAL) 5 MG tablet; Take 1 tablet (5 mg total) by mouth every evening.  Dispense: 30 tablet; Refill: 0 - montelukast (SINGULAIR) 10 MG tablet; Take 1 tablet (10 mg total) by mouth at bedtime.  Dispense: 30 tablet; Refill: 0  2. Antibiotic-induced yeast infection - fluconazole (DIFLUCAN) 150 MG tablet; Take 1 tablet (150 mg total) by mouth once for 1 dose.  Dispense: 1 tablet; Refill: 0  3. Epistaxis Resolved, saline rinses, keep nose lubricated  4. Essential  hypertension The following OTC medicines increase BP Avoid: phenylephrine, phenylpropanolamine, and pseudoephredine

## 2019-02-19 ENCOUNTER — Telehealth: Payer: Self-pay

## 2019-02-19 NOTE — Telephone Encounter (Signed)
Prior Authorization was initiated for Methylphenidate 27 mg ER and Approved. The medication is approved through 02/18/2020.

## 2019-03-12 ENCOUNTER — Encounter: Payer: Self-pay | Admitting: Family Medicine

## 2019-04-14 ENCOUNTER — Other Ambulatory Visit: Payer: Self-pay | Admitting: Family Medicine

## 2019-04-14 ENCOUNTER — Encounter: Payer: Self-pay | Admitting: Family Medicine

## 2019-04-14 DIAGNOSIS — I10 Essential (primary) hypertension: Secondary | ICD-10-CM

## 2019-04-14 DIAGNOSIS — R002 Palpitations: Secondary | ICD-10-CM

## 2019-04-14 NOTE — Telephone Encounter (Signed)
Refill request for Hypertension medication:  Metoprolol 50 mg  Azelastine 0.1% nasal spray  Last office visit pertaining to hypertension: 01/16/2019  BP Readings from Last 3 Encounters:  02/18/19 130/76  01/16/19 100/80  12/20/18 112/72     Lab Results  Component Value Date   CREATININE 1.15 (H) 09/26/2018   BUN 13 09/26/2018   NA 138 09/26/2018   K 3.8 09/26/2018   CL 101 09/26/2018   CO2 30 09/26/2018    Follow-ups on file. 04/18/19

## 2019-04-18 ENCOUNTER — Encounter: Payer: Self-pay | Admitting: Family Medicine

## 2019-04-18 ENCOUNTER — Other Ambulatory Visit: Payer: Self-pay

## 2019-04-18 ENCOUNTER — Ambulatory Visit (INDEPENDENT_AMBULATORY_CARE_PROVIDER_SITE_OTHER): Payer: 59 | Admitting: Family Medicine

## 2019-04-18 DIAGNOSIS — F902 Attention-deficit hyperactivity disorder, combined type: Secondary | ICD-10-CM

## 2019-04-18 DIAGNOSIS — F900 Attention-deficit hyperactivity disorder, predominantly inattentive type: Secondary | ICD-10-CM

## 2019-04-18 DIAGNOSIS — G43001 Migraine without aura, not intractable, with status migrainosus: Secondary | ICD-10-CM | POA: Diagnosis not present

## 2019-04-18 DIAGNOSIS — N951 Menopausal and female climacteric states: Secondary | ICD-10-CM | POA: Diagnosis not present

## 2019-04-18 DIAGNOSIS — F325 Major depressive disorder, single episode, in full remission: Secondary | ICD-10-CM

## 2019-04-18 DIAGNOSIS — I10 Essential (primary) hypertension: Secondary | ICD-10-CM | POA: Diagnosis not present

## 2019-04-18 DIAGNOSIS — F5104 Psychophysiologic insomnia: Secondary | ICD-10-CM | POA: Diagnosis not present

## 2019-04-18 DIAGNOSIS — R002 Palpitations: Secondary | ICD-10-CM | POA: Diagnosis not present

## 2019-04-18 DIAGNOSIS — F411 Generalized anxiety disorder: Secondary | ICD-10-CM

## 2019-04-18 MED ORDER — CLONIDINE HCL 0.1 MG PO TABS: 0.1000 mg | ORAL_TABLET | Freq: Every day | ORAL | 1 refills | Status: DC

## 2019-04-18 MED ORDER — METHYLPHENIDATE HCL ER (OSM) 27 MG PO TBCR: 27.0000 mg | EXTENDED_RELEASE_TABLET | ORAL | 0 refills | Status: DC

## 2019-04-18 MED ORDER — TOPIRAMATE 100 MG PO TABS: 100.0000 mg | ORAL_TABLET | Freq: Every evening | ORAL | 1 refills | Status: DC

## 2019-04-18 MED ORDER — METHYLPHENIDATE HCL ER (OSM) 27 MG PO TBCR: 27.0000 mg | EXTENDED_RELEASE_TABLET | Freq: Every day | ORAL | 0 refills | Status: DC

## 2019-04-18 MED ORDER — METOPROLOL SUCCINATE ER 50 MG PO TB24: 50.0000 mg | ORAL_TABLET | Freq: Every day | ORAL | 0 refills | Status: DC

## 2019-04-18 MED ORDER — ESCITALOPRAM OXALATE 20 MG PO TABS: 20.0000 mg | ORAL_TABLET | Freq: Every day | ORAL | 1 refills | Status: DC

## 2019-04-18 NOTE — Progress Notes (Signed)
Name: Amanda Fields   MRN: [id number]    DOB: Mar 02, 1976   Date:04/18/2019       Progress Note  Subjective  Chief Complaint  Chief Complaint  Patient presents with   Depression   Hypertension   Prediabetes    I connected with  Amanda Fields  on 04/18/19 at  1:20 PM EDT by a video enabled telemedicine application and verified that I am speaking with the correct person using two identifiers.  I discussed the limitations of evaluation and management by telemedicine and the availability of in person appointments. The patient expressed understanding and agreed to proceed. Staff also discussed with the patient that there may be a patient responsible charge related to this service. Patient Location: at home Provider Location: [facility]    HPI   Secondary hypertension: she had adrenalectomy in 2017 and bp has been going down since, we stopped spironolactone last year, also stopped metoprolol in Dec and she was taking only hctz, however she went back on Metoprolol because of palpitation,, she states lower extremity edema is worse at the end of the day, palpitation not associated with SOB or dizziness or diaphoresis. Discussed stopping hctz and taking atenolol prn back in January 2020  ADD: she has been on Concerta for the past 3 years, she denies side effects of medication, diagnosed by previous pcp with a screening test. She has been able to focus and is more productive at work, no side effects of medication, she is due for refills today   Pre-diabetes:last  hgbA1C was 6.2% , she denies polyphagia, polydipsia or polyuria. She has been trying to eat healthy but not as active because of COVID-19 quarantine   Major Depression and Anxiety: she was feeling better on Lexapro however over the past 3 weeks stress has been higher and phq 9 was up to 16 but she is now on her own, no longer at her mother's house, stress is down and phq 9 has improved . She has hot flashes when  she skips dose  Migraine headaches: she used to see migraine specialist, took topamax in the past, she was having a throbbing headaches daily again and we resumed Topamax in Dec and no headaches since. She will continue medication .   AR: she sates she has nasal congestion, rhinorrhea, some post-nasal drainage. Using Flonase, claritin and singulair. Advised to try adding little noses saline spray   Patient Active Problem List   Diagnosis Date Noted   Hypertension due to endocrine disorder 12/10/2018   ASCUS of cervix with negative high risk HPV in 10/2017 and 10/2018 11/13/2018   Pre-diabetes 09/27/2018   Prolapsed internal hemorrhoids, grade 2 08/28/2018   History of endometriosis 04/10/2018   GAD (generalized anxiety disorder) 04/10/2018   Mild recurrent major depression ([place]) 04/10/2018   Obesity (BMI 30.0-34.9) 04/10/2018   ADHD (attention deficit hyperactivity disorder), 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   Family history of malignant neoplasm of gastrointestinal tract    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   Allergic rhinitis 02/04/2016   Bilateral leg edema 04/09/2012    Past Surgical History:  Procedure Laterality Date   COLONOSCOPY WITH PROPOFOL N/A 04/09/2017   Procedure: COLONOSCOPY WITH PROPOFOL;  Surgeon: Lucilla Lame, MD;  Location: [facility];  Service: Endoscopy;  Laterality: N/A;  LATEX sensitivity   IR GENERIC HISTORICAL  07/07/2016   IR VENOGRAM RENAL UNI LEFT 07/07/2016 Aletta Edouard, MD [facility]   IR GENERIC HISTORICAL  07/07/2016   IR VENOUS SAMPLING 07/07/2016  Aletta Edouard, MD [facility]   IR GENERIC HISTORICAL  07/07/2016   IR VENOGRAM ADRENAL BI 07/07/2016 Aletta Edouard, MD [facility]   IR GENERIC HISTORICAL  07/07/2016   IR US GUIDE VASC ACCESS RIGHT 07/07/2016 Aletta Edouard, MD [facility]   IR GENERIC HISTORICAL  07/07/2016   IR [facility] SELECTIVE EACH ADDITIONAL VESSEL 07/07/2016 Aletta Edouard, MD [facility]   IR GENERIC  HISTORICAL  07/07/2016   IR VENOUS SAMPLING 07/07/2016 Aletta Edouard, MD [facility]   IR GENERIC HISTORICAL  07/07/2016   IR US GUIDE VASC ACCESS RIGHT 07/07/2016 Aletta Edouard, MD [facility]   LAPAROSCOPIC SUPRACERVICAL HYSTERECTOMY  09/27/2010   For endometriosis. Ovaries and tubes are in place.   NASAL SEPTUM SURGERY     ROBOTIC ADRENALECTOMY Left 10/27/2016   Procedure: XI ROBOTIC LEFT ADRENALECTOMY;  Surgeon: Michael Boston, MD;  Location: [facility];  Service: General;  Laterality: Left;   TONSILLECTOMY     TUBAL LIGATION  2006   WISDOM TOOTH EXTRACTION      Family History  Problem Relation Age of Onset   COPD Mother    Hypotension Mother    Colon cancer Father    Emphysema Father    Cancer Father        colon   Hyperlipidemia Sister    Heart failure Paternal Uncle    Heart attack Cousin 18   Heart disease Cousin        heart attack   Breast cancer Maternal Aunt    Breast cancer Other    Adrenal disorder Neg Hx     Social History   Socioeconomic History   Marital status: Single    Spouse name: Not on file   Number of children: 2   Years of education: Not on file   Highest education level: Bachelor's degree (e.g., BA, AB, BS)  Occupational History   Occupation: microbiology     Employer: [employer]    Comment: [employer]    Occupation: aplication review    Employer: [employer]    Comment: graduate school   Social Designer, fashion/clothing strain: Not hard at all   Food insecurity:    Worry: Never true    Inability: Never true   Transportation needs:    Medical: No    Non-medical: No  Tobacco Use   Smoking status: Never Smoker   Smokeless tobacco: Never Used  Substance and Sexual Activity   Alcohol use: Yes    Comment: social (1 drink/mo)   Drug use: No   Sexual activity: Yes    Partners: Male    Birth control/protection: Surgical, Condom  Lifestyle   Physical activity:    Days per week: 7 days    Minutes per session: 90 min    Stress: Not at all  Relationships   Social connections:    Talks on phone: More than three times a week    Gets together: More than three times a week    Attends religious service: More than 4 times per year    Active member of club or organization: Yes    Attends meetings of clubs or organizations: 1 to 4 times per year    Relationship status: Never married   Intimate partner violence:    Fear of current or ex partner: No    Emotionally abused: No    Physically abused: No    Forced sexual activity: No  Other Topics Concern   Not on file  Social History Narrative  Has one daughter in college, graduating in 2020 one son at home   Working two jobs, one at night and one during the day. She sleeps from 3 pm to 8:45 pm      Current Outpatient Medications:    atenolol (TENORMIN) 25 MG tablet, Take 1 tablet (25 mg total) by mouth daily as needed., Disp: 90 tablet, Rfl: 3   cloNIDine (CATAPRES) 0.1 MG tablet, Take 1 tablet (0.1 mg total) by mouth at bedtime., Disp: 90 tablet, Rfl: 0   escitalopram (LEXAPRO) 20 MG tablet, TK 1 T PO QD, Disp: , Rfl:    fluticasone (FLONASE) 50 MCG/ACT nasal spray, Place 2 sprays into both nostrils daily., Disp: 16 g, Rfl: 6   levocetirizine (XYZAL) 5 MG tablet, TAKE 1 TABLET(5 MG) BY MOUTH EVERY EVENING, Disp: 90 tablet, Rfl: 3   methylphenidate 27 MG PO CR tablet, Take 1 tablet (27 mg total) by mouth daily., Disp: 30 tablet, Rfl: 0   methylphenidate 27 MG PO CR tablet, Take 1 tablet (27 mg total) by mouth every morning., Disp: 30 tablet, Rfl: 0   methylphenidate 27 MG PO CR tablet, Take 1 tablet (27 mg total) by mouth every morning. Fill March 17, 2019, Disp: 30 tablet, Rfl: 0   metoprolol succinate (TOPROL-XL) 50 MG 24 hr tablet, TAKE 1 TABLET BY MOUTH EVERY DAY, Disp: 90 tablet, Rfl: 0   montelukast (SINGULAIR) 10 MG tablet, TAKE 1 TABLET(10 MG) BY MOUTH AT BEDTIME, Disp: 90 tablet, Rfl: 3   topiramate (TOPAMAX) 100 MG tablet, TAKE 1 TABLET  (100 MG TOTAL) BY MOUTH EVERY EVENING., Disp: 90 tablet, Rfl: 0   azelastine (ASTELIN) 0.1 % nasal spray, , Disp: , Rfl:   Allergies  Allergen Reactions   Ace Inhibitors Cough   Latex Itching    Gloves, condoms    I personally reviewed active problem list, medication list, allergies, family history, social history with the patient/caregiver today.   ROS  Ten systems reviewed and is negative except as mentioned in HPI   Objective  Virtual encounter, vitals not obtained.  There is no height or weight on file to calculate BMI.  Physical Exam  Awake, alert and oriented  PHQ2/9: Depression screen [facility] 2/9 04/18/2019 01/16/2019 12/10/2018 09/26/2018 08/28/2018  Decreased Interest 0 1 0 2 0  Down, Depressed, Hopeless 0 1 0 1 0  PHQ - 2 Score 0 2 0 3 0  Altered sleeping 0 3 3 3  0  Tired, decreased energy 2 3 3 2  0  Change in appetite 0 3 0 3 0  Feeling bad or failure about yourself  0 1 0 3 0  Trouble concentrating 0 2 0 2 0  Moving slowly or fidgety/restless 0 2 0 2 0  Suicidal thoughts 0 0 0 1 0  PHQ-9 Score 2 16 6 19  0  Difficult doing work/chores Somewhat difficult - Somewhat difficult - Not difficult at all  Some recent data might be hidden   PHQ-2/9 Result is positive.    Fall Risk: Fall Risk  04/18/2019 01/16/2019 09/26/2018 08/28/2018 02/01/2018  Falls in the past year? 0 0 No No No  Number falls in past yr: 0 0 - - -  Injury with Fall? 0 0 - - -  Follow up - - - - -     Assessment & Plan  1. Palpitation  - atenolol (TENORMIN) 25 MG tablet; Take 1 tablet (25 mg total) by mouth daily as needed.  Dispense: 90 tablet; Refill: 3  2. Attention deficit hyperactivity disorder (ADHD), combined type  - cloNIDine (CATAPRES) 0.1 MG tablet; Take 1 tablet (0.1 mg total) by mouth at bedtime.  Dispense: 90 tablet; Refill: 1  3. Psychophysiological insomnia  - cloNIDine (CATAPRES) 0.1 MG tablet; Take 1 tablet (0.1 mg total) by mouth at bedtime.  Dispense: 90 tablet; Refill:  1  4. Hot flashes due to menopause  - escitalopram (LEXAPRO) 20 MG tablet; Take 1 tablet (20 mg total) by mouth daily.  Dispense: 90 tablet; Refill: 1 - cloNIDine (CATAPRES) 0.1 MG tablet; Take 1 tablet (0.1 mg total) by mouth at bedtime.  Dispense: 90 tablet; Refill: 1   5. Major depression, in remission  ([place])  - escitalopram (LEXAPRO) 20 MG tablet; Take 1 tablet (20 mg total) by mouth daily.  Dispense: 90 tablet; Refill: 1  6. GAD (generalized anxiety disorder)  - escitalopram (LEXAPRO) 20 MG tablet; Take 1 tablet (20 mg total) by mouth daily.  Dispense: 90 tablet; Refill: 1  7. ADHD (attention deficit hyperactivity disorder), inattentive type  - methylphenidate 27 MG PO CR tablet; Take 1 tablet (27 mg total) by mouth daily.  Dispense: 30 tablet; Refill: 0 - methylphenidate 27 MG PO CR tablet; Take 1 tablet (27 mg total) by mouth every morning.  Dispense: 30 tablet; Refill: 0 - methylphenidate 27 MG PO CR tablet; Take 1 tablet (27 mg total) by mouth every morning. Fill March 17, 2019  Dispense: 90 tablet; Refill: 1  8. Migraine without aura and with status migrainosus, not intractable  - topiramate (TOPAMAX) 100 MG tablet; Take 1 tablet (100 mg total) by mouth every evening.  Dispense: 90 tablet; Refill: 1   I discussed the assessment and treatment plan with the patient. The patient was provided an opportunity to ask questions and all were answered. The patient agreed with the plan and demonstrated an understanding of the instructions.  The patient was advised to call back or seek an in-person evaluation if the symptoms worsen or if the condition fails to improve as anticipated.  I provided 25  minutes of non-face-to-face time during this encounter.

## 2019-05-07 ENCOUNTER — Encounter: Payer: Self-pay | Admitting: Nurse Practitioner

## 2019-05-07 ENCOUNTER — Other Ambulatory Visit: Payer: Self-pay

## 2019-05-07 ENCOUNTER — Ambulatory Visit (INDEPENDENT_AMBULATORY_CARE_PROVIDER_SITE_OTHER): Payer: 59 | Admitting: Nurse Practitioner

## 2019-05-07 VITALS — BP 100/70 | Ht 67.0 in | Wt 207.0 lb

## 2019-05-07 DIAGNOSIS — K120 Recurrent oral aphthae: Secondary | ICD-10-CM | POA: Diagnosis not present

## 2019-05-07 MED ORDER — CHLORHEXIDINE GLUCONATE 0.12% ORAL RINSE (MEDLINE KIT): 15.0000 mL | Freq: Two times a day (BID) | OROMUCOSAL | 0 refills | Status: DC

## 2019-05-07 NOTE — Progress Notes (Signed)
Virtual Visit via Video Note  I connected with Amanda Fields on 05/07/19 at 11:00 AM EDT by a video enabled telemedicine application and verified that I am speaking with the correct person using two identifiers.   Staff discussed the limitations of evaluation and management by telemedicine and the availability of in person appointments. The patient expressed understanding and agreed to proceed.  Patient location: home  My location: home office Other people present: none HPI  patient recently went out drinking with some friends and found oral lesion on her left upper lip the next day. has tried peroxide - it then swelled and blistered. has tried ice and carmax without relief. States has been drinking caffeine, orange juice and using Listerine mouth washes without relief. Burning pain, no drainage.   PHQ2/9: Depression screen [facility] 2/9 05/07/2019 04/18/2019 01/16/2019 12/10/2018 09/26/2018  Decreased Interest 0 0 1 0 2  Down, Depressed, Hopeless 0 0 1 0 1  PHQ - 2 Score 0 0 2 0 3  Altered sleeping 0 0 3 3 3   Tired, decreased energy 0 2 3 3 2   Change in appetite 0 0 3 0 3  Feeling bad or failure about yourself  0 0 1 0 3  Trouble concentrating 0 0 2 0 2  Moving slowly or fidgety/restless 0 0 2 0 2  Suicidal thoughts 0 0 0 0 1  PHQ-9 Score 0 2 16 6 19   Difficult doing work/chores Not difficult at all Somewhat difficult - Somewhat difficult -  Some recent data might be hidden    PHQ reviewed. Negative  Patient Active Problem List   Diagnosis Date Noted  . Hypertension due to endocrine disorder 12/10/2018  . ASCUS of cervix with negative high risk HPV in 10/2017 and 10/2018 11/13/2018  . Pre-diabetes 09/27/2018  . Prolapsed internal hemorrhoids, grade 2 08/28/2018  . History of endometriosis 04/10/2018  . GAD (generalized anxiety disorder) 04/10/2018  . Mild recurrent major depression ([place]) 04/10/2018  . Obesity (BMI 30.0-34.9) 04/10/2018  . ADHD (attention deficit hyperactivity  disorder), 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  . Family history of malignant neoplasm of gastrointestinal tract   . 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 Allergic rhinitis 02/04/2016  . Bilateral leg edema 04/09/2012    Past Medical History:  Diagnosis Date  . ADHD (attention deficit hyperactivity disorder), 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  . Adrenal mass, left ([place])   . Anemia    none since Hysterectomy done-related to heavy menses  . Asthma    Related to allergies"wheezes with pollen exposure" no asthma attacks  . Breast lipoma 09/06/2011   resolved- no surgery-tx. medically.  . Cough due to ACE inhibitor   . Dizziness   . Edema leg   . Elevated troponin I level 12/31/2015  . Endometriosis   . Hypertension    a. echo 03/2012 EF 60-65%, moderate LVH, nl LV diastolic fxn, nl PASP; b. echo 12/2015: EF 55-60%  . Hypertensive urgency 12/31/2015  . Migraine headache    migrianes-less frequent now  . Obesity   . Palpitation     Past Surgical History:  Procedure Laterality Date  . COLONOSCOPY WITH PROPOFOL N/A 04/09/2017   Procedure: COLONOSCOPY WITH PROPOFOL;  Surgeon: Lucilla Lame, MD;  Location: [facility];  Service: Endoscopy;  Laterality: N/A;  LATEX sensitivity  . IR GENERIC HISTORICAL  07/07/2016   IR VENOGRAM RENAL UNI LEFT 07/07/2016 Aletta Edouard, MD [facility]  . IR GENERIC HISTORICAL  07/07/2016   IR VENOUS SAMPLING 07/07/2016 Aletta Edouard, MD [facility]  . IR GENERIC HISTORICAL  07/07/2016   IR VENOGRAM ADRENAL BI 07/07/2016 Aletta Edouard, MD [facility]  . IR GENERIC HISTORICAL  07/07/2016   IR US GUIDE VASC ACCESS RIGHT 07/07/2016 Aletta Edouard, MD [facility]  . IR GENERIC HISTORICAL  07/07/2016   IR ANGIOGRAM SELECTIVE EACH ADDITIONAL VESSEL 07/07/2016 Aletta Edouard, MD [facility]  . IR GENERIC HISTORICAL  07/07/2016   IR VENOUS SAMPLING 07/07/2016 Aletta Edouard, MD [facility]  . IR GENERIC HISTORICAL  07/07/2016   IR US GUIDE VASC ACCESS RIGHT 07/07/2016 Aletta Edouard, MD [facility]  . LAPAROSCOPIC SUPRACERVICAL HYSTERECTOMY  09/27/2010   For endometriosis. Ovaries and tubes are in place.  Marland Kitchen NASAL SEPTUM SURGERY    . ROBOTIC ADRENALECTOMY Left 10/27/2016   Procedure: XI ROBOTIC LEFT ADRENALECTOMY;  Surgeon: Michael Boston, MD;  Location: [facility];  Service: General;  Laterality: Left;  . TONSILLECTOMY    . TUBAL LIGATION  2006  . WISDOM TOOTH EXTRACTION      Social History   Tobacco Use  . Smoking status: Never Smoker  . Smokeless tobacco: Never Used  Substance Use Topics  . Alcohol use: Yes    Comment: social (1 drink/mo)     Current Outpatient Medications:  .  atenolol (TENORMIN) 25 MG tablet, Take 1 tablet (25 mg total) by mouth daily as needed., Disp: 90 tablet, Rfl: 3 .  azelastine (ASTELIN) 0.1 % nasal spray, , Disp: , Rfl:  .  cloNIDine (CATAPRES) 0.1 MG tablet, Take 1 tablet (0.1 mg total) by mouth at bedtime., Disp: 90 tablet, Rfl: 1 .  escitalopram (LEXAPRO) 20 MG tablet, TK 1 T PO QD, Disp: , Rfl:  .  escitalopram (LEXAPRO) 20 MG tablet, Take 1 tablet (20 mg total) by mouth daily., Disp: 90 tablet, Rfl: 1 .  fluticasone (FLONASE) 50 MCG/ACT nasal spray, Place 2 sprays into both nostrils daily., Disp: 16 g, Rfl: 6 .  levocetirizine (XYZAL) 5 MG tablet, TAKE 1 TABLET(5 MG) BY MOUTH EVERY EVENING, Disp: 90 tablet, Rfl: 3 .  methylphenidate 27 MG PO CR tablet, Take 1 tablet (27 mg total) by mouth daily., Disp: 30 tablet, Rfl: 0 .  methylphenidate 27 MG PO CR tablet, Take 1 tablet (27 mg total) by mouth every morning., Disp: 30 tablet, Rfl: 0 .  methylphenidate 27 MG PO CR tablet, Take 1 tablet (27 mg total) by mouth every morning., Disp: 30 tablet, Rfl: 0 .  metoprolol succinate (TOPROL-XL) 50 MG 24 hr tablet, Take 1 tablet (50 mg total) by mouth daily. Take with or immediately following a meal., Disp: 90 tablet, Rfl: 0 .  montelukast (SINGULAIR) 10 MG tablet, TAKE 1 TABLET(10 MG) BY MOUTH AT BEDTIME, Disp: 90 tablet, Rfl: 3 .   topiramate (TOPAMAX) 100 MG tablet, Take 1 tablet (100 mg total) by mouth every evening., Disp: 90 tablet, Rfl: 1  Allergies  Allergen Reactions  . Ace Inhibitors Cough  . Latex Itching    Gloves, condoms    ROS   No other specific complaints in a complete review of systems (except as listed in HPI above).  Objective  Vitals:   05/07/19 1009  BP: 100/70  Weight: 207 lb (93.9 kg)  Height: 5' 7"  (1.702 m)     Body mass index is 32.42 kg/m.  Nursing Note and Vital Signs reviewed.  Physical Exam  Constitutional: Patient appears well-developed and well-nourished. No distress.  HENT: Head: Normocephalic and atraumatic. White oral lesion to top upper lip-  no drainage noted.  Pulmonary/Chest: Effort normal  Musculoskeletal: Normal range of motion,  Neurological: alert and oriented, speech normal.  Skin: No rash noted. No erythema.  Psychiatric: Patient has a normal mood and affect. behavior is normal. Judgment and thought content normal.    Assessment & Plan 1. Oral aphthous ulcer Discussed prevention, and treatment and foods to avoid.  If it does not resolve with measures discussed in 2-3 weeks return  - chlorhexidine gluconate, MEDLINE KIT, (PERIDEX) 0.12 % solution; Use as directed 15 mLs in the mouth or throat 2 (two) times daily.  Dispense: 120 mL; Refill: 0   Follow Up Instructions:   PRN   I discussed the assessment and treatment plan with the patient. The patient was provided an opportunity to ask questions and all were answered. The patient agreed with the plan and demonstrated an understanding of the instructions.   The patient was advised to call back or seek an in-person evaluation if the symptoms worsen or if the condition fails to improve as anticipated.  I provided 12 minutes of non-face-to-face time during this encounter.   Fredderick Severance, NP

## 2019-05-07 NOTE — Patient Instructions (Signed)
Canker Sores  Canker sores are small, painful sores that develop inside your mouth. You can get one or more canker sores on the inside of your lips or cheeks, on your tongue, or anywhere inside your mouth. Canker sores cannot be passed from person to person (are not contagious). These sores are different from the sores that you may get on the outside of your lips (cold sores or fever blisters). What are the causes? The cause of this condition is not known. The condition may be passed down from a parent (genetic). What increases the risk? This condition is more likely to develop in:  Women.  People in their teens or 66s.  Women who are having their menstrual period.  People who are under a lot of emotional stress.  People who do not get enough iron or B vitamins.  People who do not take care of their mouth and teeth (have poor oral hygiene).  People who have an injury inside the mouth, such as after having dental work or from chewing something hard. What are the signs or symptoms? Canker sores usually start as painful red bumps. Then they turn into small white, yellow, or gray sores that have red borders. The sores may be painful, and the pain may get worse when you eat or drink. Along with the canker sore, symptoms may also include:  Fever.  Fatigue.  Swollen lymph nodes in your neck. How is this diagnosed? This condition may be diagnosed based on your symptoms and an exam of the inside of your mouth. If you get canker sores often or if they are very bad, you may have tests, such as:  Blood tests to rule out possible causes.  Swabbing a fluid sample from the sore to be tested for infection.  Removing a small tissue sample from the sore (biopsy) to test it for cancer. How is this treated? Most canker sores go away without treatment in about 1 week. Home care is usually the only treatment that you will need. Over-the-counter medicines can relieve discomfort. If you have severe  canker sores, your health care provider may prescribe:  Numbing ointment to relieve pain. ? Do not use numbing gels or products containing benzocaine in children who are 59 years of age or younger.  Vitamins.  Steroid medicines. These may be given as pills, mouth rinses, or gels.  Antibiotic mouth rinse. Follow these instructions at home:   Apply, take, or use over-the-counter and prescription medicines only as told by your health care provider. These include vitamins and ointments.  If you were prescribed an antibiotic mouth rinse, use it as told by your health care provider. Do not stop using the antibiotic even if your condition improves.  Until the sores are healed: ? Do not drink coffee or citrus juices. ? Do not eat spicy or salty foods.  Use a mild, over-the-counter mouth rinse as recommended by your health care provider.  Practice good oral hygiene by: ? Flossing your teeth every day. ? Brushing your teeth with a soft toothbrush twice each day. Contact a health care provider if:  Your symptoms do not get better after 2 weeks.  You also have a fever or swollen glands in your neck.  You get canker sores often.  You have a canker sore that is getting larger.  You cannot eat or drink due to your canker sores. Summary  Canker sores are small, painful sores that develop inside your mouth.  Canker sores usually start as painful  red bumps that turn into small white, yellow, or gray sores that have red borders.  The sores may be quite painful, and the pain may get worse when you eat or drink.  Most canker sores clear up without treatment in about 1 week. Home care is usually the only treatment that you will need. Over-the-counter medicines can relieve discomfort. This information is not intended to replace advice given to you by your health care provider. Make sure you discuss any questions you have with your health care provider. Document Released: 03/31/2011 Document  Revised: 09/10/2017 Document Reviewed: 09/10/2017 Elsevier Interactive Patient Education  2019 [employer].

## 2019-07-18 ENCOUNTER — Other Ambulatory Visit: Payer: Self-pay | Admitting: Family Medicine

## 2019-07-18 DIAGNOSIS — R002 Palpitations: Secondary | ICD-10-CM

## 2019-07-18 DIAGNOSIS — I10 Essential (primary) hypertension: Secondary | ICD-10-CM

## 2019-07-21 ENCOUNTER — Encounter: Payer: Self-pay | Admitting: Family Medicine

## 2019-07-21 ENCOUNTER — Other Ambulatory Visit: Payer: Self-pay

## 2019-07-21 ENCOUNTER — Ambulatory Visit (INDEPENDENT_AMBULATORY_CARE_PROVIDER_SITE_OTHER): Payer: Medicaid Other | Admitting: Family Medicine

## 2019-07-21 VITALS — BP 118/76 | HR 92 | Temp 96.8°F | Resp 16 | Ht 67.0 in | Wt 204.1 lb

## 2019-07-21 DIAGNOSIS — I10 Essential (primary) hypertension: Secondary | ICD-10-CM

## 2019-07-21 DIAGNOSIS — R7303 Prediabetes: Secondary | ICD-10-CM | POA: Diagnosis not present

## 2019-07-21 DIAGNOSIS — F900 Attention-deficit hyperactivity disorder, predominantly inattentive type: Secondary | ICD-10-CM | POA: Diagnosis not present

## 2019-07-21 DIAGNOSIS — F339 Major depressive disorder, recurrent, unspecified: Secondary | ICD-10-CM | POA: Diagnosis not present

## 2019-07-21 DIAGNOSIS — Z113 Encounter for screening for infections with a predominantly sexual mode of transmission: Secondary | ICD-10-CM | POA: Diagnosis not present

## 2019-07-21 DIAGNOSIS — R5383 Other fatigue: Secondary | ICD-10-CM | POA: Diagnosis not present

## 2019-07-21 DIAGNOSIS — F411 Generalized anxiety disorder: Secondary | ICD-10-CM

## 2019-07-21 DIAGNOSIS — R739 Hyperglycemia, unspecified: Secondary | ICD-10-CM

## 2019-07-21 DIAGNOSIS — F5104 Psychophysiologic insomnia: Secondary | ICD-10-CM

## 2019-07-21 MED ORDER — METHYLPHENIDATE HCL ER (OSM) 27 MG PO TBCR: 27.0000 mg | EXTENDED_RELEASE_TABLET | ORAL | 0 refills | Status: DC

## 2019-07-21 MED ORDER — ARIPIPRAZOLE 2 MG PO TABS: 2.0000 mg | ORAL_TABLET | Freq: Every day | ORAL | 0 refills | Status: DC

## 2019-07-21 MED ORDER — METHYLPHENIDATE HCL ER (OSM) 27 MG PO TBCR: 27.0000 mg | EXTENDED_RELEASE_TABLET | Freq: Every day | ORAL | 0 refills | Status: DC

## 2019-07-21 MED ORDER — HYDROCHLOROTHIAZIDE 12.5 MG PO TABS: 12.5000 mg | ORAL_TABLET | Freq: Every day | ORAL | 0 refills | Status: DC

## 2019-07-21 NOTE — Progress Notes (Signed)
Name: Amanda Fields   MRN: [id number]    DOB: [birth year]   Date:07/21/2019       Progress Note  Subjective  Chief Complaint  Chief Complaint  Patient presents with  . Medication Refill  . Depression    Her love of her life was murder on May 14, 2019 -from a break in  . Anxiety    Has not been able to sleep  . Hypertension    Has been up and down  . Prediabetes  . Migraine    HPI  Secondary hypertension: she had adrenalectomy in 2017 and bp has been going down since, we stopped spironolactonelast year, also stopped metoprolol in Dec and she was taking only hctz, however she went back on Metoprolol because of palpitation, for some reason back on Atenolol, we will stop that, continue metoprolol and add low dose hctz   ADD: she has been on Concerta for the past 4 years, she denies side effects of medication, diagnosed by previous pcp with a screening test. She was doing well with medication, but since furlough and also depression having difficulty staying motivated   Pre-diabetes:last  hgbA1C was6.2%, she denies polyphagia, polydipsia or polyuria. She has been trying to eat healthy mostly fruit and chicken  but not as active because of COVID-19 quarantine also depression   Major Depression and Anxiety:she was feeling better on Lexapro however, in May the lover or her life, that she dated on and off for a long time was killed in his home, she has struggling since. Phq 9 is very high, she is taking Lexapro, but unable to sleep, lack of motivation, change in appetite, she is furlough for work and is going to the grave site to talk to him often.   Migraine headaches: she is back on topamax but sates having more symptoms because of erratic sleep cycle.  .  AR: she sates she has nasal congestion, rhinorrhea, some post-nasal drainage. Using Flonase, claritin and Singulair and still has symptoms, discussed referral to allergist but she wants to hold off for now   Patient Active Problem  List   Diagnosis Date Noted  . Hypertension due to endocrine disorder 12/10/2018  . ASCUS of cervix with negative high risk HPV in 10/2017 and 10/2018 11/13/2018  . Pre-diabetes 09/27/2018  . Prolapsed internal hemorrhoids, grade 2 08/28/2018  . History of endometriosis 04/10/2018  . GAD (generalized anxiety disorder) 04/10/2018  . Mild recurrent major depression ([place]) 04/10/2018  . Obesity (BMI 30.0-34.9) 04/10/2018  . ADHD (attention deficit hyperactivity disorder), 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  . Family history of malignant neoplasm of gastrointestinal tract   . 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 Allergic rhinitis 02/04/2016  . Bilateral leg edema 04/09/2012    Past Surgical History:  Procedure Laterality Date  . COLONOSCOPY WITH PROPOFOL N/A 04/09/2017   Procedure: COLONOSCOPY WITH PROPOFOL;  Surgeon: Lucilla Lame, MD;  Location: [facility];  Service: Endoscopy;  Laterality: N/A;  LATEX sensitivity  . IR GENERIC HISTORICAL  07/07/2016   IR VENOGRAM RENAL UNI LEFT 07/07/2016 Aletta Edouard, MD [facility]  . IR GENERIC HISTORICAL  07/07/2016   IR VENOUS SAMPLING 07/07/2016 Aletta Edouard, MD [facility]  . IR GENERIC HISTORICAL  07/07/2016   IR VENOGRAM ADRENAL BI 07/07/2016 Aletta Edouard, MD [facility]  . IR GENERIC HISTORICAL  07/07/2016   IR US GUIDE VASC ACCESS RIGHT 07/07/2016 Aletta Edouard, MD [facility]  . IR GENERIC HISTORICAL  07/07/2016   IR ANGIOGRAM SELECTIVE EACH ADDITIONAL  VESSEL 07/07/2016 Aletta Edouard, MD [facility]  . IR GENERIC HISTORICAL  07/07/2016   IR VENOUS SAMPLING 07/07/2016 Aletta Edouard, MD [facility]  . IR GENERIC HISTORICAL  07/07/2016   IR US GUIDE VASC ACCESS RIGHT 07/07/2016 Aletta Edouard, MD [facility]  . LAPAROSCOPIC SUPRACERVICAL HYSTERECTOMY  09/27/2010   For endometriosis. Ovaries and tubes are in place.  Marland Kitchen NASAL SEPTUM SURGERY    . ROBOTIC ADRENALECTOMY Left 10/27/2016   Procedure: XI ROBOTIC LEFT ADRENALECTOMY;  Surgeon:  Michael Boston, MD;  Location: [facility];  Service: General;  Laterality: Left;  . TONSILLECTOMY    . TUBAL LIGATION  2006  . WISDOM TOOTH EXTRACTION      Family History  Problem Relation Age of Onset  . COPD Mother   . Hypotension Mother   . Colon cancer Father   . Emphysema Father   . Cancer Father        colon  . Hyperlipidemia Sister   . Heart failure Paternal Uncle   . Heart attack Cousin 32  . Heart disease Cousin        heart attack  . Breast cancer Maternal Aunt   . Breast cancer Other   . Adrenal disorder Neg Hx     Social History   Socioeconomic History  . Marital status: Single    Spouse name: Not on file  . Number of children: 2  . Years of education: Not on file  . Highest education level: Bachelor's degree (e.g., BA, AB, BS)  Occupational History  . Occupation: microbiology     Employer: [employer]    Comment: [employer]   . Occupation: aplication review    Employer: [employer]    Comment: graduate school   Social Needs  . Financial resource strain: Not hard at all  . Food insecurity    Worry: Never true    Inability: Never true  . Transportation needs    Medical: No    Non-medical: No  Tobacco Use  . Smoking status: Never Smoker  . Smokeless tobacco: Never Used  Substance and Sexual Activity  . Alcohol use: Yes    Comment: social (1 drink/mo)  . Drug use: No  . Sexual activity: Yes    Partners: Male    Birth control/protection: Surgical, Condom  Lifestyle  . Physical activity    Days per week: 7 days    Minutes per session: 90 min  . Stress: Not at all  Relationships  . Social connections    Talks on phone: More than three times a week    Gets together: More than three times a week    Attends religious service: More than 4 times per year    Active member of club or organization: Yes    Attends meetings of clubs or organizations: 1 to 4 times per year    Relationship status: Never married  . Intimate partner violence    Fear of current or ex partner: No     Emotionally abused: No    Physically abused: No    Forced sexual activity: No  Other Topics Concern  . Not on file  Social History Narrative   Has one daughter in college and graduates Spring 2020 she also has  one son at home   Working two jobs, one at night and one during the day. She sleeps from 3 pm to 8:45 pm      Current Outpatient Medications:  .  ARIPiprazole (ABILIFY) 2 MG tablet, Take 1 tablet (  2 mg total) by mouth at bedtime., Disp: 30 tablet, Rfl: 0 .  atenolol (TENORMIN) 25 MG tablet, Take 1 tablet (25 mg total) by mouth daily as needed., Disp: 90 tablet, Rfl: 3 .  azelastine (ASTELIN) 0.1 % nasal spray, , Disp: , Rfl:  .  chlorhexidine gluconate, MEDLINE KIT, (PERIDEX) 0.12 % solution, Use as directed 15 mLs in the mouth or throat 2 (two) times daily., Disp: 120 mL, Rfl: 0 .  cloNIDine (CATAPRES) 0.1 MG tablet, Take 1 tablet (0.1 mg total) by mouth at bedtime., Disp: 90 tablet, Rfl: 1 .  escitalopram (LEXAPRO) 20 MG tablet, Take 1 tablet (20 mg total) by mouth daily., Disp: 90 tablet, Rfl: 1 .  fluticasone (FLONASE) 50 MCG/ACT nasal spray, Place 2 sprays into both nostrils daily., Disp: 16 g, Rfl: 6 .  levocetirizine (XYZAL) 5 MG tablet, TAKE 1 TABLET(5 MG) BY MOUTH EVERY EVENING, Disp: 90 tablet, Rfl: 3 .  methylphenidate 27 MG PO CR tablet, Take 1 tablet (27 mg total) by mouth every morning., Disp: 30 tablet, Rfl: 0 .  methylphenidate 27 MG PO CR tablet, Take 1 tablet (27 mg total) by mouth daily., Disp: 30 tablet, Rfl: 0 .  methylphenidate 27 MG PO CR tablet, Take 1 tablet (27 mg total) by mouth every morning., Disp: 30 tablet, Rfl: 0 .  metoprolol succinate (TOPROL-XL) 50 MG 24 hr tablet, TAKE 1 TABLET BY MOUTH EVERY DAY TAKE WITH OR IMMEDIATELY FOLLOWING A MEAL, Disp: 90 tablet, Rfl: 0 .  montelukast (SINGULAIR) 10 MG tablet, TAKE 1 TABLET(10 MG) BY MOUTH AT BEDTIME, Disp: 90 tablet, Rfl: 3 .  topiramate (TOPAMAX) 100 MG tablet, Take 1 tablet (100 mg total) by mouth  every evening., Disp: 90 tablet, Rfl: 1  Allergies  Allergen Reactions  . Ace Inhibitors Cough  . Latex Itching    Gloves, condoms    I personally reviewed active problem list, medication list, allergies, family history, social history with the patient/caregiver today.   ROS  Constitutional: Negative for fever or weight change.  Respiratory: Negative for cough and shortness of breath.   Cardiovascular: Negative for chest pain or palpitations.  Gastrointestinal: Negative for abdominal pain, no bowel changes.  Musculoskeletal: Negative for gait problem or joint swelling.  Skin: Negative for rash.  Neurological: Negative for dizziness , positive for intermittent  headache.  No other specific complaints in a complete review of systems (except as listed in HPI above).  Objective  Vitals:   07/21/19 1338  BP: 118/76  Pulse: 92  Resp: 16  Temp: (!) 96.8 F (36 C)  TempSrc: Temporal  SpO2: 99%  Weight: 204 lb 1.6 oz (92.6 kg)  Height: _0  (1.702 m)    Body mass index is 31.97 kg/m.  Physical Exam  Constitutional: Patient appears well-developed and well-nourished. Obese  No distress.  HEENT: head atraumatic, normocephalic, pupils equal and reactive to light,neck supple Cardiovascular: Normal rate, regular rhythm and normal heart sounds.  No murmur heard. No BLE edema. Pulmonary/Chest: Effort normal and breath sounds normal. No respiratory distress. Abdominal: Soft.  There is no tenderness. Psychiatric: Patient has a normal mood and affect. behavior is normal. Judgment and thought content normal.  PHQ2/9: Depression screen [facility] 2/9 07/21/2019 05/07/2019 04/18/2019 01/16/2019 12/10/2018  Decreased Interest 3 0 0 1 0  Down, Depressed, Hopeless 3 0 0 1 0  PHQ - 2 Score 6 0 0 2 0  Altered sleeping 3 0 0 3 3  Tired, decreased energy 2 0 2  3 3  Change in appetite 2 0 0 3 0  Feeling bad or failure about yourself  2 0 0 1 0  Trouble concentrating 2 0 0 2 0  Moving slowly or  fidgety/restless 3 0 0 2 0  Suicidal thoughts 3 0 0 0 0  PHQ-9 Score 23 0 _0 Difficult doing work/chores Extremely dIfficult Not difficult at all Somewhat difficult - Somewhat difficult  Some recent data might be hidden    phq 9 is positive   Fall Risk: Fall Risk  07/21/2019 05/07/2019 04/18/2019 01/16/2019 09/26/2018  Falls in the past year? 0 0 0 0 No  Number falls in past yr: 0 0 0 0 -  Injury with Fall? 0 0 0 0 -  Follow up - - - - -     Functional Status Survey: Is the patient deaf or have difficulty hearing?: No Does the patient have difficulty seeing, even when wearing glasses/contacts?: No Does the patient have difficulty concentrating, remembering, or making decisions?: No Does the patient have difficulty walking or climbing stairs?: No Does the patient have difficulty dressing or bathing?: No Does the patient have difficulty doing errands alone such as visiting a doctor's office or shopping?: No    Assessment & Plan  1. ADHD (attention deficit hyperactivity disorder), inattentive type  - methylphenidate 27 MG PO CR tablet; Take 1 tablet (27 mg total) by mouth every morning.  Dispense: 30 tablet; Refill: 0 - methylphenidate 27 MG PO CR tablet; Take 1 tablet (27 mg total) by mouth daily.  Dispense: 30 tablet; Refill: 0 - methylphenidate 27 MG PO CR tablet; Take 1 tablet (27 mg total) by mouth every morning.  Dispense: 30 tablet; Refill: 0   2. Major depression, recurrent, chronic (HCC)  - ARIPiprazole (ABILIFY) 2 MG tablet; Take 1 tablet (2 mg total) by mouth at bedtime.  Dispense: 30 tablet; Refill: 0  3. Psychophysiological insomnia  - ARIPiprazole (ABILIFY) 2 MG tablet; Take 1 tablet (2 mg total) by mouth at bedtime.  Dispense: 30 tablet; Refill: 0  4. Essential hypertension  At goal   5. GAD (generalized anxiety disorder)   6. Fatigue, unspecified type  - TSH - B12 and Folate Panel - CBC with Differential/Platelet - COMPLETE METABOLIC PANEL WITH  GFR - VITAMIN D 25 Hydroxy (Vit-D Deficiency, Fractures)  7. Hyperglycemia  - Hemoglobin A1c  8. Pre-diabetes  - Hemoglobin A1c  9. Routine screening for STI (sexually transmitted infection)  - RPR - Hepatitis panel, acute - HIV Antibody (routine testing w rflx)

## 2019-07-22 LAB — HEMOGLOBIN A1C
Hgb A1c MFr Bld: 5.9 % of total Hgb — ABNORMAL HIGH (ref <5.7)
Mean Plasma Glucose: 123 (calc)
eAG (mmol/L): 6.8 (calc)

## 2019-07-22 LAB — CBC WITH DIFFERENTIAL/PLATELET
Absolute Monocytes: 382 cells/uL (ref 200–950)
Basophils Absolute: 51 cells/uL (ref 0–200)
Basophils Relative: 0.9 %
Eosinophils Absolute: 530 cells/uL — ABNORMAL HIGH (ref 15–500)
Eosinophils Relative: 9.3 %
HCT: 41 % (ref 35.0–45.0)
Hemoglobin: 13.3 g/dL (ref 11.7–15.5)
Lymphs Abs: 1824 cells/uL (ref 850–3900)
MCH: 26.1 pg — ABNORMAL LOW (ref 27.0–33.0)
MCHC: 32.4 g/dL (ref 32.0–36.0)
MCV: 80.4 fL (ref 80.0–100.0)
MPV: 11.1 fL (ref 7.5–12.5)
Monocytes Relative: 6.7 %
Neutro Abs: 2913 cells/uL (ref 1500–7800)
Neutrophils Relative %: 51.1 %
Platelets: 282 10*3/uL (ref 140–400)
RBC: 5.1 10*6/uL (ref 3.80–5.10)
RDW: 14.7 % (ref 11.0–15.0)
Total Lymphocyte: 32 %
WBC: 5.7 10*3/uL (ref 3.8–10.8)

## 2019-07-22 LAB — HEPATITIS PANEL, ACUTE
Hep A IgM: NONREACTIVE
Hep B C IgM: NONREACTIVE
Hepatitis B Surface Ag: NONREACTIVE
Hepatitis C Ab: NONREACTIVE
SIGNAL TO CUT-OFF: 0.02 (ref <1.00)

## 2019-07-22 LAB — B12 AND FOLATE PANEL
Folate: 6.8 ng/mL
Vitamin B-12: 646 pg/mL (ref 200–1100)

## 2019-07-22 LAB — COMPLETE METABOLIC PANEL WITH GFR
AG Ratio: 1.5 (calc) (ref 1.0–2.5)
ALT: 15 U/L (ref 6–29)
AST: 17 U/L (ref 10–35)
Albumin: 4.1 g/dL (ref 3.6–5.1)
Alkaline phosphatase (APISO): 55 U/L (ref 31–125)
BUN/Creatinine Ratio: 11 (calc) (ref 6–22)
BUN: 13 mg/dL (ref 7–25)
CO2: 23 mmol/L (ref 20–32)
Calcium: 9.3 mg/dL (ref 8.6–10.2)
Chloride: 109 mmol/L (ref 98–110)
Creat: 1.17 mg/dL — ABNORMAL HIGH (ref 0.50–1.10)
GFR, Est African American: 65 mL/min/{1.73_m2} (ref >=60)
GFR, Est Non African American: 56 mL/min/{1.73_m2} — ABNORMAL LOW (ref >=60)
Globulin: 2.8 g/dL (calc) (ref 1.9–3.7)
Glucose, Bld: 91 mg/dL (ref 65–99)
Potassium: 4.1 mmol/L (ref 3.5–5.3)
Sodium: 141 mmol/L (ref 135–146)
Total Bilirubin: 0.4 mg/dL (ref 0.2–1.2)
Total Protein: 6.9 g/dL (ref 6.1–8.1)

## 2019-07-22 LAB — HIV ANTIBODY (ROUTINE TESTING W REFLEX): HIV 1&2 Ab, 4th Generation: NONREACTIVE

## 2019-07-22 LAB — TSH: TSH: 2.21 mIU/L

## 2019-07-22 LAB — RPR: RPR Ser Ql: NONREACTIVE

## 2019-07-22 LAB — VITAMIN D 25 HYDROXY (VIT D DEFICIENCY, FRACTURES): Vit D, 25-Hydroxy: 36 ng/mL (ref 30–100)

## 2019-07-30 ENCOUNTER — Encounter: Payer: Self-pay | Admitting: Family Medicine

## 2019-08-15 ENCOUNTER — Other Ambulatory Visit: Payer: Self-pay | Admitting: Family Medicine

## 2019-08-15 DIAGNOSIS — I10 Essential (primary) hypertension: Secondary | ICD-10-CM

## 2019-08-15 DIAGNOSIS — F339 Major depressive disorder, recurrent, unspecified: Secondary | ICD-10-CM

## 2019-08-15 DIAGNOSIS — R002 Palpitations: Secondary | ICD-10-CM

## 2019-08-15 DIAGNOSIS — F5104 Psychophysiologic insomnia: Secondary | ICD-10-CM

## 2019-08-16 NOTE — Telephone Encounter (Signed)
Requested medication (s) are due for refill today: Yes   Requested medication (s) are on the active medication list: Yes  Last refill:  07/21/19  Future visit scheduled: Yes  Notes to clinic:  Cannot delegate per protocol     Requested Prescriptions  Pending Prescriptions Disp Refills   ARIPiprazole (ABILIFY) 2 MG tablet [Pharmacy Med Name: ARIPIPRAZOLE 2MG TABLETS] 30 tablet 0    Sig: TAKE 1 TABLET(2 MG) BY MOUTH AT BEDTIME     Not Delegated - Psychiatry:  Antipsychotics - Second Generation (Atypical) - aripiprazole Failed - 08/15/2019 11:42 PM      Failed - This refill cannot be delegated      Passed - Valid encounter within last 6 months    Recent Outpatient Visits          3 weeks ago Major depression, recurrent, chronic ([place])   [facility] Steele Sizer, MD   3 months ago Oral aphthous ulcer   [place], NP   4 months ago [facility] Steele Sizer, MD   5 months ago Acute recurrent pansinusitis   [facility] Suezanne Cheshire E, NP   7 months ago Major depression, recurrent, chronic [facility])   [facility] Steele Sizer, MD      Future Appointments            In 1 week Steele Sizer, MD [facility], [place]   In 2 months Steele Sizer, MD [facility], [facility]           Refused Prescriptions Disp Refills   metoprolol succinate (TOPROL-XL) 50 MG 24 hr tablet [Pharmacy Med Name: METOPROLOL ER SUCCINATE 50MG TABS] 30 tablet     Sig: TAKE 1 TABLET BY MOUTH EVERY DAY TAKE WITH OR IMMEDIATELY FOLLOWING A MEAL     Cardiovascular:  Beta Blockers Passed - 08/15/2019 11:42 PM      Passed - Last BP in normal range    BP Readings from Last 1 Encounters:  07/21/19 118/76         Passed - Last Heart Rate in normal range    Pulse Readings from Last 1 Encounters:  07/21/19 92         Passed - Valid  encounter within last 6 months    Recent Outpatient Visits          3 weeks ago Major depression, recurrent, chronic ([place])   [facility] Steele Sizer, MD   3 months ago Oral aphthous ulcer   [place], NP   4 months ago [facility] Steele Sizer, MD   5 months ago Acute recurrent pansinusitis   [place], NP   7 months ago Major depression, recurrent, chronic [facility])   [facility] Steele Sizer, MD      Future Appointments            In 1 week Steele Sizer, MD [facility], [place]   In 2 months Steele Sizer, MD [facility], [facility]

## 2019-08-26 ENCOUNTER — Ambulatory Visit: Payer: Medicaid Other | Admitting: Family Medicine

## 2019-09-20 ENCOUNTER — Other Ambulatory Visit: Payer: Self-pay | Admitting: Family Medicine

## 2019-09-20 DIAGNOSIS — F339 Major depressive disorder, recurrent, unspecified: Secondary | ICD-10-CM

## 2019-09-20 DIAGNOSIS — F5104 Psychophysiologic insomnia: Secondary | ICD-10-CM

## 2019-09-20 NOTE — Telephone Encounter (Signed)
Requested medication (s) are due for refill today: yes  Requested medication (s) are on the active medication list: yes  Last refill:  08/19/2019  Future visit scheduled: yes  Notes to clinic:  Refill cannot be delegated    Requested Prescriptions  Pending Prescriptions Disp Refills   ARIPiprazole (ABILIFY) 2 MG tablet [Pharmacy Med Name: ARIPIPRAZOLE 2MG TABLETS] 30 tablet 0    Sig: TAKE 1 TABLET(2 MG) BY MOUTH AT BEDTIME     Not Delegated - Psychiatry:  Antipsychotics - Second Generation (Atypical) - aripiprazole Failed - 09/20/2019 10:53 AM      Failed - This refill cannot be delegated      Passed - Valid encounter within last 6 months    Recent Outpatient Visits          2 months ago Major depression, recurrent, chronic ([place])   [facility] Steele Sizer, MD   4 months ago Oral aphthous ulcer   [place], NP   5 months ago [facility] Steele Sizer, MD   7 months ago Acute recurrent pansinusitis   [place], NP   8 months ago Major depression, recurrent, chronic [facility])   [facility] Steele Sizer, MD      Future Appointments            In 1 month Ancil Boozer, Drue Stager, MD [facility], [facility]

## 2019-10-03 ENCOUNTER — Encounter: Payer: Self-pay | Admitting: Radiology

## 2019-10-18 ENCOUNTER — Other Ambulatory Visit: Payer: Self-pay | Admitting: Family Medicine

## 2019-10-18 DIAGNOSIS — I10 Essential (primary) hypertension: Secondary | ICD-10-CM

## 2019-10-18 DIAGNOSIS — F902 Attention-deficit hyperactivity disorder, combined type: Secondary | ICD-10-CM

## 2019-10-18 DIAGNOSIS — F5104 Psychophysiologic insomnia: Secondary | ICD-10-CM

## 2019-10-18 DIAGNOSIS — G43001 Migraine without aura, not intractable, with status migrainosus: Secondary | ICD-10-CM

## 2019-10-18 NOTE — Telephone Encounter (Signed)
Requested medication (s) are due for refill today: yes  Requested medication (s) are on the active medication list: yes  Last refill:  07/18/2019  Future visit scheduled: yes  Notes to clinic: refill cannot be delegated   Requested Prescriptions  Pending Prescriptions Disp Refills   topiramate (TOPAMAX) 100 MG tablet [Pharmacy Med Name: TOPIRAMATE 100MG TABLETS] 90 tablet 1    Sig: TAKE 1 TABLET(100 MG) BY MOUTH EVERY EVENING     Not Delegated - Neurology: Anticonvulsants - topiramate & zonisamide Failed - 10/18/2019  5:42 PM      Failed - This refill cannot be delegated      Failed - Cr in normal range and within 360 days    Creat  Date Value Ref Range Status  07/21/2019 1.17 (H) 0.50 - 1.10 mg/dL Final         Passed - CO2 in normal range and within 360 days    CO2  Date Value Ref Range Status  07/21/2019 23 20 - 32 mmol/L Final         Passed - Valid encounter within last 12 months    Recent Outpatient Visits          2 months ago Major depression, recurrent, chronic ([place])   [facility] Steele Sizer, MD   5 months ago Oral aphthous ulcer   [place], NP   6 months ago [facility] Steele Sizer, MD   8 months ago Acute recurrent pansinusitis   [facility] Suezanne Cheshire E, NP   9 months ago Major depression, recurrent, chronic [facility])   [facility] [place], Drue Stager, MD      Future Appointments            In 1 week Steele Sizer, MD [facility], [facility]            cloNIDine (CATAPRES) 0.1 MG tablet [Pharmacy Med Name: CLONIDINE 0.1MG TABLETS] 90 tablet 1    Sig: TAKE 1 TABLET(0.1 MG) BY MOUTH AT BEDTIME     Cardiovascular:  Alpha-2 Agonists Passed - 10/18/2019  5:42 PM      Passed - Last BP in normal range    BP Readings from Last 1 Encounters:  07/21/19 118/76         Passed - Last Heart Rate  in normal range    Pulse Readings from Last 1 Encounters:  07/21/19 92         Passed - Valid encounter within last 6 months    Recent Outpatient Visits          2 months ago Major depression, recurrent, chronic ([place])   [facility] Steele Sizer, MD   5 months ago Oral aphthous ulcer   [place], NP   6 months ago [facility] Steele Sizer, MD   8 months ago Acute recurrent pansinusitis   [place], NP   9 months ago Major depression, recurrent, chronic [facility])   [facility] Steele Sizer, MD      Future Appointments            In 1 week Steele Sizer, MD [facility], [facility]            hydrochlorothiazide (HYDRODIURIL) 12.5 MG tablet [Pharmacy Med Name: HYDROCHLOROTHIAZIDE 12.5MG TABLETS] 90 tablet 0  Sig: TAKE 1 TABLET(12.5 MG) BY MOUTH DAILY     Cardiovascular: Diuretics - Thiazide Failed - 10/18/2019  5:42 PM      Failed - Cr in normal range and within 360 days    Creat  Date Value Ref Range Status  07/21/2019 1.17 (H) 0.50 - 1.10 mg/dL Final         Passed - Ca in normal range and within 360 days    Calcium  Date Value Ref Range Status  07/21/2019 9.3 8.6 - 10.2 mg/dL Final         Passed - K in normal range and within 360 days    Potassium  Date Value Ref Range Status  07/21/2019 4.1 3.5 - 5.3 mmol/L Final         Passed - Na in normal range and within 360 days    Sodium  Date Value Ref Range Status  07/21/2019 141 135 - 146 mmol/L Final  10/29/2017 143 134 - 144 mmol/L Final         Passed - Last BP in normal range    BP Readings from Last 1 Encounters:  07/21/19 118/76         Passed - Valid encounter within last 6 months    Recent Outpatient Visits          2 months ago Major depression, recurrent, chronic ([place])   [facility] Steele Sizer, MD   5 months ago Oral aphthous ulcer   [place], NP   6 months ago [facility] Steele Sizer, MD   8 months ago Acute recurrent pansinusitis   [place], NP   9 months ago Major depression, recurrent, chronic [facility])   [facility] Steele Sizer, MD      Future Appointments            In 1 week Steele Sizer, MD [facility], [facility]

## 2019-10-20 ENCOUNTER — Other Ambulatory Visit: Payer: Self-pay

## 2019-10-20 DIAGNOSIS — J0141 Acute recurrent pansinusitis: Secondary | ICD-10-CM

## 2019-10-20 MED ORDER — FLUTICASONE PROPIONATE 50 MCG/ACT NA SUSP: 2.0000 | Freq: Every day | NASAL | 6 refills | Status: DC

## 2019-10-22 ENCOUNTER — Other Ambulatory Visit: Payer: Self-pay | Admitting: Family Medicine

## 2019-10-22 DIAGNOSIS — F339 Major depressive disorder, recurrent, unspecified: Secondary | ICD-10-CM

## 2019-10-22 DIAGNOSIS — F5104 Psychophysiologic insomnia: Secondary | ICD-10-CM

## 2019-10-22 NOTE — Telephone Encounter (Signed)
Requested medication (s) are due for refill today: yes  Requested medication (s) are on the active medication list: yes  Last refill:  09/22/2019  Future visit scheduled: yes  Notes to clinic:  Refill cannot be delegated   Requested Prescriptions  Pending Prescriptions Disp Refills   ARIPiprazole (ABILIFY) 2 MG tablet [Pharmacy Med Name: ARIPIPRAZOLE 2MG TABLETS] 30 tablet 0    Sig: TAKE 1 TABLET(2 MG) BY MOUTH AT BEDTIME     Not Delegated - Psychiatry:  Antipsychotics - Second Generation (Atypical) - aripiprazole Failed - 10/22/2019 10:35 AM      Failed - This refill cannot be delegated      Passed - Valid encounter within last 6 months    Recent Outpatient Visits          3 months ago Major depression, recurrent, chronic ([place])   [facility] Steele Sizer, MD   5 months ago Oral aphthous ulcer   [place], NP   6 months ago [facility] Steele Sizer, MD   8 months ago Acute recurrent pansinusitis   [place], NP   9 months ago Major depression, recurrent, chronic [facility])   [facility] Steele Sizer, MD      Future Appointments            In 5 days Steele Sizer, MD [facility], [facility]

## 2019-10-27 ENCOUNTER — Ambulatory Visit (INDEPENDENT_AMBULATORY_CARE_PROVIDER_SITE_OTHER): Payer: 59 | Admitting: Family Medicine

## 2019-10-27 ENCOUNTER — Encounter: Payer: Self-pay | Admitting: Family Medicine

## 2019-10-27 ENCOUNTER — Other Ambulatory Visit: Payer: Self-pay

## 2019-10-27 DIAGNOSIS — F411 Generalized anxiety disorder: Secondary | ICD-10-CM

## 2019-10-27 DIAGNOSIS — F5104 Psychophysiologic insomnia: Secondary | ICD-10-CM

## 2019-10-27 DIAGNOSIS — I10 Essential (primary) hypertension: Secondary | ICD-10-CM

## 2019-10-27 DIAGNOSIS — R002 Palpitations: Secondary | ICD-10-CM | POA: Diagnosis not present

## 2019-10-27 DIAGNOSIS — N951 Menopausal and female climacteric states: Secondary | ICD-10-CM | POA: Diagnosis not present

## 2019-10-27 DIAGNOSIS — F900 Attention-deficit hyperactivity disorder, predominantly inattentive type: Secondary | ICD-10-CM | POA: Diagnosis not present

## 2019-10-27 DIAGNOSIS — G43001 Migraine without aura, not intractable, with status migrainosus: Secondary | ICD-10-CM

## 2019-10-27 DIAGNOSIS — F339 Major depressive disorder, recurrent, unspecified: Secondary | ICD-10-CM

## 2019-10-27 MED ORDER — METHYLPHENIDATE HCL ER (OSM) 27 MG PO TBCR: 27.0000 mg | EXTENDED_RELEASE_TABLET | ORAL | 0 refills | Status: DC

## 2019-10-27 MED ORDER — METHYLPHENIDATE HCL ER (OSM) 27 MG PO TBCR: 27.0000 mg | EXTENDED_RELEASE_TABLET | Freq: Every day | ORAL | 0 refills | Status: DC

## 2019-10-27 MED ORDER — HYDROCHLOROTHIAZIDE 12.5 MG PO TABS: 12.5000 mg | ORAL_TABLET | Freq: Every day | ORAL | 0 refills | Status: DC

## 2019-10-27 MED ORDER — METOPROLOL SUCCINATE ER 50 MG PO TB24: ORAL_TABLET | ORAL | 1 refills | Status: DC

## 2019-10-27 MED ORDER — CLONIDINE HCL 0.1 MG PO TABS: 0.1000 mg | ORAL_TABLET | Freq: Every day | ORAL | 0 refills | Status: DC

## 2019-10-27 MED ORDER — ESCITALOPRAM OXALATE 20 MG PO TABS: 20.0000 mg | ORAL_TABLET | Freq: Every day | ORAL | 1 refills | Status: DC

## 2019-10-27 MED ORDER — ARIPIPRAZOLE 2 MG PO TABS: 2.0000 mg | ORAL_TABLET | Freq: Every evening | ORAL | 1 refills | Status: DC

## 2019-10-27 MED ORDER — TOPIRAMATE 100 MG PO TABS: 100.0000 mg | ORAL_TABLET | Freq: Every day | ORAL | 0 refills | Status: DC

## 2019-10-27 NOTE — Progress Notes (Signed)
Name: Amanda Fields   MRN: [id number]    DOB: [birth year]   Date:10/27/2019       Progress Note  Subjective  Chief Complaint  Chief Complaint  Patient presents with  . Depression  . Hypertension    I connected with  Jerrilyn Cairo  on 10/27/19 at  2:00 PM EST by a video enabled telemedicine application and verified that I am speaking with the correct person using two identifiers.  I discussed the limitations of evaluation and management by telemedicine and the availability of in person appointments. The patient expressed understanding and agreed to proceed. Staff also discussed with the patient that there may be a patient responsible charge related to this service. 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 Manuella Ghazi ) with a screening test. She was doing well with medication, but since furlough and also depression having difficulty staying motivated. She states it helps her stay organized and more focused at work   Pre-diabetes:lasthgbA1C was down from 6.2% to 5.9 % , she denies polyphagia, polydipsia or polyuria. Nunzio Cory been trying to eat healthy mostly fruit and chicken   Major Depression and Anxiety:she was feeling better on Lexapro however, in May the lover or her life, that she dated on and off for a long time was killed in his home, she was  Struggling and Phq 9 was very high August 2020, we added low dose Ability because she was unable to sleep, had  lack of motivation, change in appetite, she states since started on medication she is feeling much better and denies side effects. phq 9 today  is zero    Migraine headaches: she is back on topamax but sates having more symptoms because of erratic sleep cycle. She states if she skips medication she wakes up with a migraine   AR: she sates she has nasal congestion, rhinorrhea, some post-nasal drainage. Using Flonase, claritin and Singulair and still has symptoms, discussed referral to allergist but she wants to hold off for now . She stopped using Astelin because of taste   Patient Active Problem List   Diagnosis Date Noted  . Hypertension due to endocrine disorder 12/10/2018  . ASCUS of cervix with negative high risk HPV in 10/2017 and 10/2018 11/13/2018  . Pre-diabetes 09/27/2018  . Prolapsed internal hemorrhoids, grade 2 08/28/2018  . History of endometriosis 04/10/2018  . GAD (generalized anxiety disorder) 04/10/2018  . Mild recurrent major depression ([place]) 04/10/2018  . Obesity (BMI 30.0-34.9) 04/10/2018  . ADHD (attention deficit hyperactivity disorder), 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  . Family history of malignant neoplasm of gastrointestinal tract   . 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 Allergic rhinitis 02/04/2016  . Bilateral leg edema 04/09/2012    Past Surgical History:  Procedure Laterality Date  . COLONOSCOPY WITH PROPOFOL N/A 04/09/2017   Procedure: COLONOSCOPY WITH PROPOFOL;  Surgeon: Lucilla Lame, MD;  Location: [place];  Service: Endoscopy;  Laterality: N/A;  LATEX sensitivity  . IR GENERIC HISTORICAL  07/07/2016   IR VENOGRAM RENAL UNI LEFT 07/07/2016 Aletta Edouard, MD [facility]  . IR GENERIC HISTORICAL  07/07/2016  IR VENOUS SAMPLING 07/07/2016 Aletta Edouard, MD [facility]  . IR GENERIC HISTORICAL  07/07/2016   IR VENOGRAM ADRENAL BI 07/07/2016 Aletta Edouard, MD [facility]  . IR GENERIC HISTORICAL  07/07/2016   IR US GUIDE VASC ACCESS RIGHT 07/07/2016 Aletta Edouard, MD [facility]  . IR GENERIC HISTORICAL  07/07/2016   IR ANGIOGRAM SELECTIVE EACH ADDITIONAL VESSEL 07/07/2016 Aletta Edouard,  MD [facility]  . IR GENERIC HISTORICAL  07/07/2016   IR VENOUS SAMPLING 07/07/2016 Aletta Edouard, MD [facility]  . IR GENERIC HISTORICAL  07/07/2016   IR US GUIDE VASC ACCESS RIGHT 07/07/2016 Aletta Edouard, MD [facility]  . LAPAROSCOPIC SUPRACERVICAL HYSTERECTOMY  09/27/2010   For endometriosis. Ovaries and tubes are in place.  Marland Kitchen NASAL SEPTUM SURGERY    . ROBOTIC ADRENALECTOMY Left 10/27/2016   Procedure: XI ROBOTIC LEFT ADRENALECTOMY;  Surgeon: Michael Boston, MD;  Location: [facility];  Service: General;  Laterality: Left;  . TONSILLECTOMY    . TUBAL LIGATION  2006  . WISDOM TOOTH EXTRACTION      Family History  Problem Relation Age of Onset  . COPD Mother   . Hypotension Mother   . Colon cancer Father   . Emphysema Father   . Cancer Father        colon  . Hyperlipidemia Sister   . Heart failure Paternal Uncle   . Heart attack Cousin 32  . Heart disease Cousin        heart attack  . Breast cancer Maternal Aunt   . Breast cancer Other   . Adrenal disorder Neg Hx     Social History   Socioeconomic History  . Marital status: Single    Spouse name: Not on file  . Number of children: 2  . Years of education: Not on file  . Highest education level: Bachelor's degree (e.g., BA, AB, BS)  Occupational History  . Occupation: microbiology     Employer: [employer]    Comment: [employer]   . Occupation: aplication review    Employer: [employer]    Comment: graduate school   Social Needs  . Financial resource strain: Not hard at all  . Food insecurity    Worry: Never true    Inability: Never true  . Transportation needs    Medical: No    Non-medical: No  Tobacco Use  . Smoking status: Never Smoker  . Smokeless tobacco: Never Used  Substance and Sexual Activity  . Alcohol use: Yes    Comment: social (1 drink/mo)  . Drug use: No  . Sexual activity: Yes    Partners: Male    Birth control/protection: Surgical, Condom  Lifestyle  . Physical activity    Days per week: 7 days     Minutes per session: 90 min  . Stress: Not at all  Relationships  . Social connections    Talks on phone: More than three times a week    Gets together: More than three times a week    Attends religious service: More than 4 times per year    Active member of club or organization: Yes    Attends meetings of clubs or organizations: 1 to 4 times per year    Relationship status: Never married  . Intimate partner violence    Fear of current or ex partner: No    Emotionally abused: No    Physically abused: No    Forced sexual activity: No  Other Topics Concern  . Not on file  Social History Narrative   Has one daughter in college and graduates Spring 2020 she also has  one son at home   Working two jobs, one at night and one during the day. She sleeps from 3 pm to 8:45 pm      Current Outpatient Medications:  .  ARIPiprazole (ABILIFY) 2 MG tablet, TAKE 1 TABLET(2 MG) BY MOUTH AT BEDTIME, Disp: 30 tablet, Rfl: 0 .  atenolol (TENORMIN) 25 MG tablet, Take 25 mg by mouth daily., Disp: , Rfl:  .  azelastine (ASTELIN) 0.1 % nasal spray, , Disp: , Rfl:  .  cloNIDine (CATAPRES) 0.1 MG tablet, TAKE 1 TABLET(0.1 MG) BY MOUTH AT BEDTIME, Disp: 90 tablet, Rfl: 0 .  escitalopram (LEXAPRO) 20 MG tablet, Take 1 tablet (20 mg total) by mouth daily., Disp: 90 tablet, Rfl: 1 .  fluticasone (FLONASE) 50 MCG/ACT nasal spray, Place 2 sprays into both nostrils daily., Disp: 16 g, Rfl: 6 .  hydrochlorothiazide (HYDRODIURIL) 12.5 MG tablet, TAKE 1 TABLET(12.5 MG) BY MOUTH DAILY, Disp: 90 tablet, Rfl: 0 .  levocetirizine (XYZAL) 5 MG tablet, TAKE 1 TABLET(5 MG) BY MOUTH EVERY EVENING, Disp: 90 tablet, Rfl: 3 .  methylphenidate 27 MG PO CR tablet, Take 1 tablet (27 mg total) by mouth every morning., Disp: 30 tablet, Rfl: 0 .  methylphenidate 27 MG PO CR tablet, Take 1 tablet (27 mg total) by mouth daily., Disp: 30 tablet, Rfl: 0 .  methylphenidate 27 MG PO CR tablet, Take 1 tablet (27 mg total) by mouth every  morning., Disp: 30 tablet, Rfl: 0 .  metoprolol succinate (TOPROL-XL) 50 MG 24 hr tablet, TAKE 1 TABLET BY MOUTH EVERY DAY TAKE WITH OR IMMEDIATELY FOLLOWING A MEAL, Disp: 90 tablet, Rfl: 0 .  montelukast (SINGULAIR) 10 MG tablet, TAKE 1 TABLET(10 MG) BY MOUTH AT BEDTIME, Disp: 90 tablet, Rfl: 3 .  topiramate (TOPAMAX) 100 MG tablet, TAKE 1 TABLET(100 MG) BY MOUTH EVERY EVENING, Disp: 90 tablet, Rfl: 0 .  chlorhexidine gluconate, MEDLINE KIT, (PERIDEX) 0.12 % solution, Use as directed 15 mLs in the mouth or throat 2 (two) times daily. (Patient not taking: Reported on 10/27/2019), Disp: 120 mL, Rfl: 0  Allergies  Allergen Reactions  . Ace Inhibitors Cough  . Latex Itching    Gloves, condoms    I personally reviewed active problem list, medication list, allergies, family history, social history, health maintenance with the patient/caregiver today.   ROS  Ten systems reviewed and is negative except as mentioned in HPI   Objective  Virtual encounter, vitals obtained at home  Today's Vitals   10/27/19 1409  BP: 110/79  Temp: (!) 97 F (36.1 C)  Weight: 197 lb 4.8 oz (89.5 kg)   Body mass index is 30.9 kg/m.   There is no height or weight on file to calculate BMI.  Physical Exam  Awake, alert and oriented   PHQ2/9: Depression screen [facility] 2/9 10/27/2019 07/21/2019 05/07/2019 04/18/2019 01/16/2019  Decreased Interest 0 3 0 0 1  Down, Depressed, Hopeless 0 3 0 0 1  PHQ - 2 Score 0 6 0 0 2  Altered sleeping 0 3 0 0 3  Tired, decreased energy 0 2 0 2 3  Change in appetite 0 2 0 0 3  Feeling bad or failure about yourself  0 2 0 0 1  Trouble concentrating 0 2 0 0 2  Moving slowly or fidgety/restless 0 3 0 0 2  Suicidal thoughts 0 3 0 0 0  PHQ-9 Score 0 23 0 2 16  Difficult doing work/chores - Extremely dIfficult Not difficult at all Somewhat difficult -  Some recent data might be hidden   PHQ-2/9 Result is negative.    Fall Risk: Fall Risk  10/27/2019 07/21/2019 05/07/2019 04/18/2019  01/16/2019  Falls in the past year? 0 0 0 0 0  Number falls in past yr: 0 0 0 0 0  Injury with Fall? 0 0 0 0 0  Follow up - - - - -     Assessment & Plan  1. ADHD (attention deficit hyperactivity disorder), inattentive type  - methylphenidate 27 MG PO CR tablet; Take 1 tablet (27 mg total) by mouth every morning.  Dispense: 30 tablet; Refill: 0 - methylphenidate 27 MG PO CR tablet; Take 1 tablet (27 mg total) by mouth daily.  Dispense: 30 tablet; Refill: 0 - methylphenidate 27 MG PO CR tablet; Take 1 tablet (27 mg total) by mouth every morning.  Dispense: 30 tablet; Refill: 0  2. Palpitation  - metoprolol succinate (TOPROL-XL) 50 MG 24 hr tablet; TAKE 1 TABLET BY MOUTH EVERY DAY TAKE WITH OR IMMEDIATELY FOLLOWING A MEAL  Dispense: 90 tablet; Refill: 0  3. HTN (hypertension), benign  - metoprolol succinate (TOPROL-XL) 50 MG 24 hr tablet; TAKE 1 TABLET BY MOUTH EVERY DAY TAKE WITH OR IMMEDIATELY FOLLOWING A MEAL  Dispense: 90 tablet; Refill: 0  4. Hot flashes due to menopause  - escitalopram (LEXAPRO) 20 MG tablet; Take 1 tablet (20 mg total) by mouth daily.  Dispense: 90 tablet; Refill: 1 - cloNIDine (CATAPRES) 0.1 MG tablet; Take 1 tablet (0.1 mg total) by mouth at bedtime.  Dispense: 90 tablet; Refill: 0  5. GAD (generalized anxiety disorder)  - escitalopram (LEXAPRO) 20 MG tablet; Take 1 tablet (20 mg total) by mouth daily.  Dispense: 90 tablet; Refill: 1  6. Major depression, recurrent, chronic (HCC)  - escitalopram (LEXAPRO) 20 MG tablet; Take 1 tablet (20 mg total) by mouth daily.  Dispense: 90 tablet; Refill: 1 - ARIPiprazole (ABILIFY) 2 MG tablet; Take 1 tablet (2 mg total) by mouth every evening.  Dispense: 90 tablet; Refill: 1  7. Psychophysiological insomnia  - ARIPiprazole (ABILIFY) 2 MG tablet; Take 1 tablet (2 mg total) by mouth every evening.  Dispense: 90 tablet; Refill: 1 - cloNIDine (CATAPRES) 0.1 MG tablet; Take 1 tablet (0.1 mg total) by mouth at bedtime.   Dispense: 90 tablet; Refill: 0  8. Essential hypertension  - hydrochlorothiazide (HYDRODIURIL) 12.5 MG tablet; Take 1 tablet (12.5 mg total) by mouth daily.  Dispense: 90 tablet; Refill: 0  9. Migraine without aura and with status migrainosus, not intractable  - topiramate (TOPAMAX) 100 MG tablet; Take 1 tablet (100 mg total) by mouth daily.  Dispense: 90 tablet; Refill: 0  I discussed the assessment and treatment plan with the patient. The patient was provided an opportunity to ask questions and all were answered. The patient agreed with the plan and demonstrated an understanding of the instructions.  The patient was advised to call back or seek an in-person evaluation if the symptoms worsen or if the condition fails to improve as anticipated.  I provided 25 minutes of non-face-to-face time during this encounter.

## 2019-11-12 ENCOUNTER — Encounter: Payer: Self-pay | Admitting: Gastroenterology

## 2019-11-12 ENCOUNTER — Other Ambulatory Visit: Payer: Self-pay

## 2019-11-12 ENCOUNTER — Encounter: Payer: Self-pay | Admitting: Advanced Practice Midwife

## 2019-11-12 ENCOUNTER — Other Ambulatory Visit (HOSPITAL_COMMUNITY)
Admission: RE | Admit: 2019-11-12 | Discharge: 2019-11-12 | Disposition: A | Discharge status: Home / Self Care | Payer: 59 | Source: Ambulatory Visit | Attending: Advanced Practice Midwife | Admitting: Advanced Practice Midwife

## 2019-11-12 ENCOUNTER — Ambulatory Visit (INDEPENDENT_AMBULATORY_CARE_PROVIDER_SITE_OTHER): Payer: 59 | Admitting: Advanced Practice Midwife

## 2019-11-12 VITALS — BP 135/91 | HR 65 | Wt 220.1 lb

## 2019-11-12 DIAGNOSIS — Z01419 Encounter for gynecological examination (general) (routine) without abnormal findings: Secondary | ICD-10-CM | POA: Insufficient documentation

## 2019-11-12 DIAGNOSIS — Z800 Family history of malignant neoplasm of digestive organs: Secondary | ICD-10-CM

## 2019-11-12 DIAGNOSIS — Z90711 Acquired absence of uterus with remaining cervical stump: Secondary | ICD-10-CM

## 2019-11-12 NOTE — Progress Notes (Signed)
MGMammogram  utd 12/30/2018 STI testing

## 2019-11-12 NOTE — Progress Notes (Signed)
GYNECOLOGY ANNUAL PREVENTATIVE CARE ENCOUNTER NOTE  History:     Amanda Fields is a 43 y.o. G60P2002 female here for a routine annual gynecologic exam.  Current complaints: none.   Denies abnormal vaginal bleeding, discharge, pelvic pain, problems with intercourse or other gynecologic concerns.  Female partners, currently abstinent. Lives with her son. Denies SI, HI, IPV.    Gynecologic History No LMP recorded. Patient has had a hysterectomy. Contraception: status post hysterectomy Last Pap: 11/11/2018. Results were: ASCUS with negative HPV Last mammogram: 12/30/2018. Results were: normal  Obstetric History OB History  Gravida Para Term Preterm AB Living  2 2 2     2   SAB TAB Ectopic Multiple Live Births          2    # Outcome Date GA Lbr Len/2nd Weight Sex Delivery Anes PTL Lv  2 Term 12/18/04    M Vag-Spont   LIV  1 Term 10/30/95    F Vag-Spont   LIV    Past Medical History:  Diagnosis Date  . ADHD (attention deficit hyperactivity disorder), 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  . Adrenal mass, left ([place])   . Anemia    none since Hysterectomy done-related to heavy menses  . Asthma    Related to allergies"wheezes with pollen exposure" no asthma attacks  . Breast lipoma 09/06/2011   resolved- no surgery-tx. medically.  . Cough due to ACE inhibitor   . Dizziness   . Edema leg   . Elevated troponin I level 12/31/2015  . Endometriosis   . Hypertension    a. echo 03/2012 EF 60-65%, moderate LVH, nl LV diastolic fxn, nl PASP; b. echo 12/2015: EF 55-60%  . Hypertensive urgency 12/31/2015  . Migraine headache    migrianes-less frequent now  . Obesity   . Palpitation     Past Surgical History:  Procedure Laterality Date  . COLONOSCOPY WITH PROPOFOL N/A 04/09/2017   Procedure: COLONOSCOPY WITH PROPOFOL;  Surgeon: Lucilla Lame, MD;  Location: [place];  Service: Endoscopy;  Laterality: N/A;  LATEX sensitivity  . IR GENERIC HISTORICAL  07/07/2016   IR VENOGRAM RENAL UNI  LEFT 07/07/2016 Aletta Edouard, MD [facility]  . IR GENERIC HISTORICAL  07/07/2016   IR VENOUS SAMPLING 07/07/2016 Aletta Edouard, MD [facility]  . IR GENERIC HISTORICAL  07/07/2016   IR VENOGRAM ADRENAL BI 07/07/2016 Aletta Edouard, MD [facility]  . IR GENERIC HISTORICAL  07/07/2016   IR US GUIDE VASC ACCESS RIGHT 07/07/2016 Aletta Edouard, MD [facility]  . IR GENERIC HISTORICAL  07/07/2016   IR ANGIOGRAM SELECTIVE EACH ADDITIONAL VESSEL 07/07/2016 Aletta Edouard, MD [facility]  . IR GENERIC HISTORICAL  07/07/2016   IR VENOUS SAMPLING 07/07/2016 Aletta Edouard, MD [facility]  . IR GENERIC HISTORICAL  07/07/2016   IR US GUIDE VASC ACCESS RIGHT 07/07/2016 Aletta Edouard, MD [facility]  . LAPAROSCOPIC SUPRACERVICAL HYSTERECTOMY  09/27/2010   For endometriosis. Ovaries and tubes are in place.  Marland Kitchen NASAL SEPTUM SURGERY    . ROBOTIC ADRENALECTOMY Left 10/27/2016   Procedure: XI ROBOTIC LEFT ADRENALECTOMY;  Surgeon: Michael Boston, MD;  Location: [facility];  Service: General;  Laterality: Left;  . TONSILLECTOMY    . TUBAL LIGATION  2006  . WISDOM TOOTH EXTRACTION      Current Outpatient Medications on File Prior to Visit  Medication Sig Dispense Refill  . ARIPiprazole (ABILIFY) 2 MG tablet Take 1 tablet (2 mg total) by mouth every evening. 90 tablet 1  .  escitalopram (LEXAPRO) 20 MG tablet Take 1 tablet (20 mg total) by mouth daily. 90 tablet 1  . fluticasone (FLONASE) 50 MCG/ACT nasal spray Place 2 sprays into both nostrils daily. 16 g 6  . hydrochlorothiazide (HYDRODIURIL) 12.5 MG tablet Take 1 tablet (12.5 mg total) by mouth daily. 90 tablet 0  . levocetirizine (XYZAL) 5 MG tablet TAKE 1 TABLET(5 MG) BY MOUTH EVERY EVENING 90 tablet 3  . methylphenidate 27 MG PO CR tablet Take 1 tablet (27 mg total) by mouth every morning. 30 tablet 0  . methylphenidate 27 MG PO CR tablet Take 1 tablet (27 mg total) by mouth daily. 30 tablet 0  . methylphenidate 27 MG PO CR tablet Take 1 tablet (27 mg  total) by mouth every morning. 30 tablet 0  . metoprolol succinate (TOPROL-XL) 50 MG 24 hr tablet TAKE 1 TABLET BY MOUTH EVERY DAY TAKE WITH OR IMMEDIATELY FOLLOWING A MEAL 90 tablet 1  . topiramate (TOPAMAX) 100 MG tablet Take 1 tablet (100 mg total) by mouth daily. 90 tablet 0  . cloNIDine (CATAPRES) 0.1 MG tablet Take 1 tablet (0.1 mg total) by mouth at bedtime. (Patient not taking: Reported on 11/12/2019) 90 tablet 0  . montelukast (SINGULAIR) 10 MG tablet TAKE 1 TABLET(10 MG) BY MOUTH AT BEDTIME (Patient not taking: Reported on 11/12/2019) 90 tablet 3  . [DISCONTINUED] lisinopril-hydrochlorothiazide (PRINZIDE) 20-12.5 MG per tablet Take 1 tablet by mouth daily. 30 tablet 6  . [DISCONTINUED] torsemide (DEMADEX) 10 MG tablet Take 10 mg by mouth daily.       No current facility-administered medications on file prior to visit.     Allergies  Allergen Reactions  . Ace Inhibitors Cough  . Latex Itching    Gloves, condoms    Social History:  reports that she has never smoked. She has never used smokeless tobacco. She reports current alcohol use. She reports that she does not use drugs.  Family History  Problem Relation Age of Onset  . COPD Mother   . Hypotension Mother   . Colon cancer Father   . Emphysema Father   . Cancer Father        colon  . Hyperlipidemia Sister   . Heart failure Paternal Uncle   . Heart attack Cousin 32  . Heart disease Cousin        heart attack  . Breast cancer Maternal Aunt   . Breast cancer Other   . Adrenal disorder Neg Hx     The following portions of the patient's history were reviewed and updated as appropriate: allergies, current medications, past family history, past medical history, past social history, past surgical history and problem list.  Review of Systems Pertinent items noted in HPI and remainder of comprehensive ROS otherwise negative.  Physical Exam:  BP (!) 135/91   Pulse 65   Wt 220 lb 1.6 oz (99.8 kg)   BMI 34.47 kg/m   CONSTITUTIONAL: Well-developed, well-nourished female in no acute distress.  HENT:  Normocephalic, atraumatic, External right and left ear normal. Oropharynx is clear and moist EYES: Conjunctivae and EOM are normal. Pupils are equal, round, and reactive to light. No scleral icterus.  NECK: Normal range of motion, supple, no masses.  Normal thyroid.  SKIN: Skin is warm and dry. No rash noted. Not diaphoretic. No erythema. No pallor. MUSCULOSKELETAL: Normal range of motion. No tenderness.  No cyanosis, clubbing, or edema.  2+ distal pulses. NEUROLOGIC: Alert and oriented to person, place, and time. Normal reflexes, muscle  tone coordination.  PSYCHIATRIC: Normal mood and affect. Normal behavior. Normal judgment and thought content. CARDIOVASCULAR: Normal heart rate noted, regular rhythm RESPIRATORY: Clear to auscultation bilaterally. Effort and breath sounds normal, no problems with respiration noted. BREASTS: Symmetric in size. No masses, tenderness, skin changes, nipple drainage, or lymphadenopathy bilaterally. ABDOMEN: Soft, no distention noted.  No tenderness, rebound or guarding.  PELVIC: Normal appearing external genitalia and urethral meatus; normal appearing vaginal mucosa and cervix.  No abnormal discharge noted.  Pap smear obtained.  Normal uterine size, no other palpable masses, no uterine or adnexal tenderness.   Assessment and Plan:    1. Well woman exam with routine gynecological exam - Per Dr. Mickey Farber note will require follow-up colpo if abnormal pap result - Well woman labs collected 07/21/19 - Pap IG and HPV (high risk) DNA detection - Mammogram Screening Bilateral Tomo; Future - Cervicovaginal ancillary only( [place])  2. History of supracervical hysterectomy   3. Family history of colon cancer - Colonoscopy due - Ambulatory referral to Gastroenterology  Will follow up results of pap smear and manage accordingly. Mammogram scheduled Routine preventative health  maintenance measures emphasized. Please refer to After Visit Summary for other counseling recommendations.     Total visit time: 30 minutes. Greater than 50% of visit spent in counseling and coordination of care  Mallie Snooks, MSN, CNM Certified Nurse Midwife, [facility] for [employer], [place] 11/12/19 12:36 PM

## 2019-11-12 NOTE — Patient Instructions (Signed)

## 2019-11-14 LAB — CERVICOVAGINAL ANCILLARY ONLY
Bacterial Vaginitis (gardnerella): NEGATIVE
Candida Glabrata: NEGATIVE
Candida Vaginitis: NEGATIVE
Chlamydia: NEGATIVE
Comment: NEGATIVE
Comment: NEGATIVE
Comment: NEGATIVE
Comment: NEGATIVE
Comment: NEGATIVE
Comment: NORMAL
Neisseria Gonorrhea: NEGATIVE
Trichomonas: NEGATIVE

## 2019-11-17 ENCOUNTER — Other Ambulatory Visit (HOSPITAL_COMMUNITY)
Admission: RE | Admit: 2019-11-17 | Discharge: 2019-11-17 | Disposition: A | Discharge status: Home / Self Care | Payer: 59 | Source: Ambulatory Visit | Attending: Advanced Practice Midwife | Admitting: Advanced Practice Midwife

## 2019-11-17 DIAGNOSIS — Z01419 Encounter for gynecological examination (general) (routine) without abnormal findings: Secondary | ICD-10-CM | POA: Diagnosis not present

## 2019-11-17 DIAGNOSIS — Z90711 Acquired absence of uterus with remaining cervical stump: Secondary | ICD-10-CM | POA: Insufficient documentation

## 2019-11-17 DIAGNOSIS — R87612 Low grade squamous intraepithelial lesion on cytologic smear of cervix (LGSIL): Secondary | ICD-10-CM | POA: Insufficient documentation

## 2019-11-17 NOTE — Addendum Note (Signed)
Addended by: Crosby Oyster on: 11/17/2019 10:08 AM   Modules accepted: Orders

## 2019-11-23 LAB — CYTOLOGY - PAP
Comment: NEGATIVE
Comment: NEGATIVE
HPV 16: NEGATIVE
HPV 18 / 45: NEGATIVE
High risk HPV: POSITIVE — AB

## 2019-11-24 ENCOUNTER — Telehealth: Payer: Self-pay | Admitting: Advanced Practice Midwife

## 2019-11-24 ENCOUNTER — Encounter: Payer: Self-pay | Admitting: Advanced Practice Midwife

## 2019-11-24 DIAGNOSIS — N879 Dysplasia of cervix uteri, unspecified: Secondary | ICD-10-CM | POA: Insufficient documentation

## 2019-11-24 DIAGNOSIS — R87612 Low grade squamous intraepithelial lesion on cytologic smear of cervix (LGSIL): Secondary | ICD-10-CM | POA: Insufficient documentation

## 2019-11-24 NOTE — Telephone Encounter (Signed)
Telephone call to discuss pap results. VLTCB  Mallie Snooks, MSN, CNM Certified Nurse Midwife, [facility] [employer] [facility] for [employer], [facility] 11/24/19 2:19 PM

## 2019-11-26 ENCOUNTER — Telehealth: Payer: Self-pay | Admitting: Advanced Practice Midwife

## 2019-11-26 NOTE — Telephone Encounter (Signed)
Patient reached by phone at [place]. Reviewed Pap results and need for Colposcopy at her earlier convenience. Denies questions or concerns at end of our call.  Mallie Snooks, MSN, CNM Certified Nurse Midwife, [facility] for [employer], [place] 11/26/19 9:45 AM

## 2019-12-22 NOTE — Progress Notes (Deleted)
Referring Provider: Steele Sizer, MD Primary Care Physician:  Steele Sizer, MD  Reason for Consultation:  Discuss colonoscopy   IMPRESSION:  ***  PLAN: ***  Please see the "Patient Instructions" section for addition details about the plan.  HPI: Amanda Fields is a 44 y.o. 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  Father with colon cancer.   No other known family history of colon cancer or polyps. No family history of uterine/endometrial cancer, pancreatic cancer or gastric/stomach cancer.  Labs 07/21/19: Normal CBC with normal MCV and RDW. Hemoglobin 13.3.   Past Medical History:  Diagnosis Date  . ADHD (attention deficit hyperactivity disorder), 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  . Adrenal mass, left ([place])   . Anemia    none since Hysterectomy done-related to heavy menses  . Asthma    Related to allergies"wheezes with pollen exposure" no asthma attacks  . Breast lipoma 09/06/2011   resolved- no surgery-tx. medically.  . Cough due to ACE inhibitor   . Dizziness   . Edema leg   . Elevated troponin I level 12/31/2015  . Endometriosis   . Hypertension    a. echo 03/2012 EF 60-65%, moderate LVH, nl LV diastolic fxn, nl PASP; b. echo 12/2015: EF 55-60%  . Hypertensive urgency 12/31/2015  . Migraine headache    migrianes-less frequent now  . Obesity   . Palpitation     Past Surgical History:  Procedure Laterality Date  . COLONOSCOPY WITH PROPOFOL N/A 04/09/2017   Procedure: COLONOSCOPY WITH PROPOFOL;  Surgeon: Lucilla Lame, MD;  Location: [place];  Service: Endoscopy;  Laterality: N/A;  LATEX sensitivity  . IR GENERIC HISTORICAL  07/07/2016   IR VENOGRAM RENAL UNI LEFT 07/07/2016 Aletta Edouard, MD [facility]  . IR GENERIC HISTORICAL  07/07/2016   IR VENOUS SAMPLING 07/07/2016 Aletta Edouard, MD [facility]  . IR GENERIC HISTORICAL  07/07/2016   IR VENOGRAM  ADRENAL BI 07/07/2016 Aletta Edouard, MD [facility]  . IR GENERIC HISTORICAL  07/07/2016   IR US GUIDE VASC ACCESS RIGHT 07/07/2016 Aletta Edouard, MD [facility]  . IR GENERIC HISTORICAL  07/07/2016   IR ANGIOGRAM SELECTIVE EACH ADDITIONAL VESSEL 07/07/2016 Aletta Edouard, MD [facility]  . IR GENERIC HISTORICAL  07/07/2016   IR VENOUS SAMPLING 07/07/2016 Aletta Edouard, MD [facility]  . IR GENERIC HISTORICAL  07/07/2016   IR US GUIDE VASC ACCESS RIGHT 07/07/2016 Aletta Edouard, MD [facility]  . LAPAROSCOPIC SUPRACERVICAL HYSTERECTOMY  09/27/2010   For endometriosis. Ovaries and tubes are in place.  Marland Kitchen NASAL SEPTUM SURGERY    . ROBOTIC ADRENALECTOMY Left 10/27/2016   Procedure: XI ROBOTIC LEFT ADRENALECTOMY;  Surgeon: Michael Boston, MD;  [facility]: [facility];  Service: General;  Laterality: Left;  . TONSILLECTOMY    . TUBAL LIGATION  2006  . WISDOM TOOTH EXTRACTION      Current Outpatient Medications  Medication Sig Dispense Refill  . ARIPiprazole (ABILIFY) 2 MG tablet Take 1 tablet (2 mg total) by mouth every evening. 90 tablet 1  . cloNIDine (CATAPRES) 0.1 MG tablet Take 1 tablet (0.1 mg total) by mouth at bedtime. (Patient not taking: Reported on 11/12/2019) 90 tablet 0  . escitalopram (LEXAPRO) 20 MG tablet Take 1 tablet (20 mg total) by mouth daily. 90 tablet 1  . fluticasone (FLONASE) 50 MCG/ACT nasal spray Place 2 sprays into both nostrils daily. 16 g 6  . hydrochlorothiazide (HYDRODIURIL) 12.5 MG tablet  Take 1 tablet (12.5 mg total) by mouth daily. 90 tablet 0  . levocetirizine (XYZAL) 5 MG tablet TAKE 1 TABLET(5 MG) BY MOUTH EVERY EVENING 90 tablet 3  . methylphenidate 27 MG PO CR tablet Take 1 tablet (27 mg total) by mouth every morning. 30 tablet 0  . methylphenidate 27 MG PO CR tablet Take 1 tablet (27 mg total) by mouth daily. 30 tablet 0  . methylphenidate 27 MG PO CR tablet Take 1 tablet (27 mg total) by mouth every morning. 30 tablet 0  . metoprolol succinate (TOPROL-XL)  50 MG 24 hr tablet TAKE 1 TABLET BY MOUTH EVERY DAY TAKE WITH OR IMMEDIATELY FOLLOWING A MEAL 90 tablet 1  . montelukast (SINGULAIR) 10 MG tablet TAKE 1 TABLET(10 MG) BY MOUTH AT BEDTIME (Patient not taking: Reported on 11/12/2019) 90 tablet 3  . topiramate (TOPAMAX) 100 MG tablet Take 1 tablet (100 mg total) by mouth daily. 90 tablet 0   No current facility-administered medications for this visit.    Allergies as of 12/23/2019 - Review Complete 11/12/2019  Allergen Reaction Noted  . Ace inhibitors Cough 03/22/2012  . Latex Itching 12/22/2015    Family History  Problem Relation Age of Onset  . COPD Mother   . Hypotension Mother   . Colon cancer Father   . Emphysema Father   . Cancer Father        colon  . Hyperlipidemia Sister   . Heart failure Paternal Uncle   . Heart attack Cousin 32  . Heart disease Cousin        heart attack  . Breast cancer Maternal Aunt   . Breast cancer Other   . Adrenal disorder Neg Hx     Social History   Socioeconomic History  . Marital status: Single    Spouse name: Not on file  . Number of children: 2  . Years of education: Not on file  . Highest education level: Bachelor's degree (e.g., BA, AB, BS)  Occupational History  . Occupation: microbiology     Employer: [employer]    Comment: [employer]   . Occupation: aplication review    Employer: [employer]    Comment: graduate school   Tobacco Use  . Smoking status: Never Smoker  . Smokeless tobacco: Never Used  Substance and Sexual Activity  . Alcohol use: Yes    Comment: social (1 drink/mo)  . Drug use: No  . Sexual activity: Yes    Partners: Male    Birth control/protection: Surgical, Condom  Other Topics Concern  . Not on file  Social History Narrative   Has one daughter in college and graduates Spring 2020 she also has  one son at home   Working two jobs, one at night and one during the day. She sleeps from 3 pm to 8:45 pm    Social Determinants of Health   Financial Resource Strain:   .  Difficulty of Paying Living Expenses: Not on file  Food Insecurity:   . Worried About Charity fundraiser in the Last Year: Not on file  . Ran Out of Food in the Last Year: Not on file  Transportation Needs:   . Lack of Transportation (Medical): Not on file  . Lack of Transportation (Non-Medical): Not on file  Physical Activity:   . Days of Exercise per Week: Not on file  . Minutes of Exercise per Session: Not on file  Stress:   . Feeling of Stress : Not on file  Social  Connections:   . Frequency of Communication with Friends and Family: Not on file  . Frequency of Social Gatherings with Friends and Family: Not on file  . Attends Religious Services: Not on file  . Active Member of Clubs or Organizations: Not on file  . Attends Archivist Meetings: Not on file  . Marital Status: Not on file  Intimate Partner Violence:   . Fear of Current or Ex-Partner: Not on file  . Emotionally Abused: Not on file  . Physically Abused: Not on file  . Sexually Abused: Not on file    Review of Systems: 12 system ROS is negative except as noted above.   Physical Exam: General:   Alert,  well-nourished, pleasant and cooperative in NAD Head:  Normocephalic and atraumatic. Eyes:  Sclera clear, no icterus.   Conjunctiva pink. Ears:  Normal auditory acuity. Nose:  No deformity, discharge,  or lesions. Mouth:  No deformity or lesions.   Neck:  Supple; no masses or thyromegaly. Lungs:  Clear throughout to auscultation.   No wheezes. Heart:  Regular rate and rhythm; no murmurs. Abdomen:  Soft,nontender, nondistended, normal bowel sounds, no rebound or guarding. No hepatosplenomegaly.   Rectal:  Deferred  Msk:  Symmetrical. No boney deformities LAD: No inguinal or umbilical LAD Extremities:  No clubbing or edema. Neurologic:  Alert and  oriented x4;  grossly nonfocal Skin:  Intact without significant lesions or rashes. Psych:  Alert and cooperative. Normal mood and affect.    Devion Chriscoe  L. Tarri Glenn, MD, MPH 12/22/2019, 6:47 PM

## 2019-12-23 ENCOUNTER — Ambulatory Visit: Payer: 59 | Admitting: Gastroenterology

## 2019-12-25 ENCOUNTER — Encounter: Payer: 59 | Admitting: Obstetrics and Gynecology

## 2020-01-07 ENCOUNTER — Ambulatory Visit: Payer: 59

## 2020-01-13 ENCOUNTER — Other Ambulatory Visit: Payer: Self-pay | Admitting: Family Medicine

## 2020-01-13 DIAGNOSIS — R002 Palpitations: Secondary | ICD-10-CM

## 2020-01-24 ENCOUNTER — Telehealth: Payer: Self-pay | Admitting: Obstetrics and Gynecology

## 2020-01-24 ENCOUNTER — Encounter: Payer: Self-pay | Admitting: Obstetrics and Gynecology

## 2020-01-24 NOTE — Telephone Encounter (Signed)
GYN Telephone Note  Patient called at [phone] and based on new ASCCP guidelines her CIN 3 5 year risk is 4.00% and guidelines recommend rpt pap and hpv testing in one year and not a colposcopy. D/w pt and she is amenable to plan. She does have a vaginal odor so I will inbasket front desk to have her come in anytime on Monday for self swab visit.

## 2020-01-26 ENCOUNTER — Other Ambulatory Visit: Payer: Self-pay

## 2020-01-26 ENCOUNTER — Other Ambulatory Visit (HOSPITAL_COMMUNITY)
Admission: RE | Admit: 2020-01-26 | Discharge: 2020-01-26 | Disposition: A | Discharge status: Home / Self Care | Payer: 59 | Source: Ambulatory Visit | Attending: Obstetrics and Gynecology | Admitting: Obstetrics and Gynecology

## 2020-01-26 ENCOUNTER — Encounter: Payer: 59 | Admitting: Obstetrics and Gynecology

## 2020-01-26 ENCOUNTER — Ambulatory Visit (INDEPENDENT_AMBULATORY_CARE_PROVIDER_SITE_OTHER): Payer: 59 | Admitting: *Deleted

## 2020-01-26 VITALS — BP 171/77 | HR 71

## 2020-01-26 DIAGNOSIS — N898 Other specified noninflammatory disorders of vagina: Secondary | ICD-10-CM | POA: Insufficient documentation

## 2020-01-26 NOTE — Progress Notes (Signed)
SUBJECTIVE:  44 y.o. female complains of thick vaginal discharge for  week(s). Denies abnormal vaginal bleeding or significant pelvic pain or fever. No UTI symptoms. Denies history of known exposure to STD.  No LMP recorded. Patient has had a hysterectomy.  OBJECTIVE:  She appears well, afebrile. Urine dipstick: not done.  ASSESSMENT:  Vaginal Discharge  Vaginal Odor   PLAN:  GC, chlamydia, trichomonas, BVAG, CVAG probe sent to lab. Treatment: To be determined once lab results are received ROV prn if symptoms persist or worsen.

## 2020-01-26 NOTE — Progress Notes (Signed)
Patient seen and assessed by nursing staff during this encounter. I have reviewed the chart and agree with the documentation and plan.  Aletha Halim, MD 01/26/2020 2:34 PM

## 2020-01-28 LAB — CERVICOVAGINAL ANCILLARY ONLY
Bacterial Vaginitis (gardnerella): NEGATIVE
Candida Glabrata: NEGATIVE
Candida Vaginitis: NEGATIVE
Chlamydia: NEGATIVE
Comment: NEGATIVE
Comment: NEGATIVE
Comment: NEGATIVE
Comment: NEGATIVE
Comment: NEGATIVE
Comment: NORMAL
Neisseria Gonorrhea: NEGATIVE
Trichomonas: NEGATIVE

## 2020-02-03 ENCOUNTER — Telehealth: Payer: Self-pay | Admitting: Family Medicine

## 2020-02-03 DIAGNOSIS — F900 Attention-deficit hyperactivity disorder, predominantly inattentive type: Secondary | ICD-10-CM

## 2020-02-04 ENCOUNTER — Other Ambulatory Visit: Payer: Self-pay | Admitting: Advanced Practice Midwife

## 2020-02-04 ENCOUNTER — Other Ambulatory Visit: Payer: Self-pay

## 2020-02-04 ENCOUNTER — Ambulatory Visit
Admission: RE | Admit: 2020-02-04 | Discharge: 2020-02-04 | Disposition: A | Discharge status: Home / Self Care | Payer: 59 | Source: Ambulatory Visit | Attending: Advanced Practice Midwife | Admitting: Advanced Practice Midwife

## 2020-02-04 DIAGNOSIS — Z01419 Encounter for gynecological examination (general) (routine) without abnormal findings: Secondary | ICD-10-CM

## 2020-02-04 NOTE — Telephone Encounter (Signed)
Pt is scheduled for a Virtual on Friday 02/06/2020

## 2020-02-06 ENCOUNTER — Encounter: Payer: Self-pay | Admitting: Family Medicine

## 2020-02-06 ENCOUNTER — Ambulatory Visit (INDEPENDENT_AMBULATORY_CARE_PROVIDER_SITE_OTHER): Payer: 59 | Admitting: Family Medicine

## 2020-02-06 ENCOUNTER — Other Ambulatory Visit: Payer: Self-pay

## 2020-02-06 VITALS — BP 120/78 | HR 88 | Temp 97.5°F | Wt 217.0 lb

## 2020-02-06 DIAGNOSIS — F5104 Psychophysiologic insomnia: Secondary | ICD-10-CM | POA: Diagnosis not present

## 2020-02-06 DIAGNOSIS — E896 Postprocedural adrenocortical (-medullary) hypofunction: Secondary | ICD-10-CM | POA: Insufficient documentation

## 2020-02-06 DIAGNOSIS — R002 Palpitations: Secondary | ICD-10-CM

## 2020-02-06 DIAGNOSIS — F411 Generalized anxiety disorder: Secondary | ICD-10-CM | POA: Diagnosis not present

## 2020-02-06 DIAGNOSIS — J3089 Other allergic rhinitis: Secondary | ICD-10-CM | POA: Diagnosis not present

## 2020-02-06 DIAGNOSIS — F339 Major depressive disorder, recurrent, unspecified: Secondary | ICD-10-CM | POA: Diagnosis not present

## 2020-02-06 DIAGNOSIS — G43001 Migraine without aura, not intractable, with status migrainosus: Secondary | ICD-10-CM

## 2020-02-06 DIAGNOSIS — I10 Essential (primary) hypertension: Secondary | ICD-10-CM

## 2020-02-06 DIAGNOSIS — F900 Attention-deficit hyperactivity disorder, predominantly inattentive type: Secondary | ICD-10-CM | POA: Diagnosis not present

## 2020-02-06 DIAGNOSIS — R7303 Prediabetes: Secondary | ICD-10-CM

## 2020-02-06 MED ORDER — METHYLPHENIDATE HCL ER (OSM) 27 MG PO TBCR: 27.0000 mg | EXTENDED_RELEASE_TABLET | ORAL | 0 refills | Status: DC

## 2020-02-06 MED ORDER — SUMATRIPTAN SUCCINATE 100 MG PO TABS: 100.0000 mg | ORAL_TABLET | ORAL | 0 refills | Status: DC | PRN

## 2020-02-06 MED ORDER — METHYLPHENIDATE HCL ER (OSM) 27 MG PO TBCR: 27.0000 mg | EXTENDED_RELEASE_TABLET | Freq: Every day | ORAL | 0 refills | Status: DC

## 2020-02-06 MED ORDER — TRAZODONE HCL 50 MG PO TABS: 25.0000 mg | ORAL_TABLET | Freq: Every evening | ORAL | 0 refills | Status: DC | PRN

## 2020-02-06 MED ORDER — HYDROCHLOROTHIAZIDE 12.5 MG PO TABS: 12.5000 mg | ORAL_TABLET | Freq: Every day | ORAL | 0 refills | Status: DC

## 2020-02-06 MED ORDER — LEVOCETIRIZINE DIHYDROCHLORIDE 5 MG PO TABS: 5.0000 mg | ORAL_TABLET | Freq: Every evening | ORAL | 0 refills | Status: DC

## 2020-02-06 NOTE — Progress Notes (Signed)
Name: Amanda Fields   MRN: [id number]    DOB: 1976/10/22   Date:02/06/2020       Progress Note  Subjective  Chief Complaint  Chief Complaint  Patient presents with  . Medication Refill    methylphenidate refill    I connected with  Jerrilyn Cairo on 02/06/20 at  9:00 AM EST by telephone and verified that I am speaking with the correct person using two identifiers.  I discussed the limitations, risks, security and privacy concerns of performing an evaluation and management service by telephone and the availability of in person appointments. Staff also discussed with the patient that there may be a patient responsible charge related to this service. Patient Location: at home Provider Location: [facility]   HPI  History of secondary hypertension, HTN : she had adrenalectomy in 2017 and bp has been going down since, we stopped spironolactonelast year, she is now on Metoprolol because of palpitation, also taking HCT and clonidine at night , therefore she has essential hypertension now   Obesity: she has gained 20 lbs since last visit three months ago, she states she is only eating one meal a day at lunch, but states not physically active - afraid of going to the gym. She has been working from home since March of last year. Discussed considering changing to a carbohydrate restrictive diet instead   ADD: she has been on Concertaforabout 5 years, she denies side effects of medication, diagnosed by previous pcp ( Dr. Manuella Ghazi ) with a screening test. She was doing well with medication, but since furlough and also depression having difficulty staying motivated and focused. She states working from home has been hard for her , she states she feels like being home is not very good for her, she states she will try to find another job   Pre-diabetes:lasthgbA1C was down from 6.2% to 5.9 % , she denies polyphagia, polydipsia or polyuria. Nunzio Cory been cooking at home, eating  salads, fish, chicken, and she will add physical activity   Major Depression and Anxiety:she was feeling better on Lexapro however, in May the lover or her life, that she dated on and off for a long time was killed in his home, she was  Struggling and Phq 9 was very high August 2020, we added low dose Ability because , she did well for a while, however no longer sleeping. Falls asleep but wakes up at least three times per night and takes her a long time to fall back asleep, we will try adding Trazodone . She states she is tired of being home , done with COVID-19  Migraine headaches: sheis back on topamax and states is has helped, no recent episodes. She states migraine episodes starts on nuchal area, and radiates forward. It is sharp, associated with nausea , phonophobia and photophobia. She takes Excedrin migraine   AR: she sates she has nasal congestion, rhinorrhea, some post-nasal drainage. Using Flonase, Xyzal but she stopped singulair on her own. She states symptoms are still bothersome but she does not want to see an allergist at this time  Insomnia: she falls asleep but unable to stay asleep, discussed adding Trazodone  Patient Active Problem List   Diagnosis Date Noted  . Cervical dysplasia 11/24/2019  . Hypertension due to endocrine disorder 12/10/2018  . Pre-diabetes 09/27/2018  . Prolapsed internal hemorrhoids, grade 2 08/28/2018  . History of endometriosis 04/10/2018  . GAD (generalized anxiety disorder) 04/10/2018  . Mild recurrent major depression ([place])  04/10/2018  . Obesity (BMI 30.0-34.9) 04/10/2018  . ADHD (attention deficit hyperactivity disorder), 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  . Family history of malignant neoplasm of gastrointestinal tract   . 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 Allergic rhinitis 02/04/2016  . Bilateral leg edema 04/09/2012    Past Surgical History:  Procedure Laterality Date  . COLONOSCOPY WITH PROPOFOL N/A 04/09/2017   Procedure: COLONOSCOPY WITH  PROPOFOL;  Surgeon: Lucilla Lame, MD;  Location: [place];  Service: Endoscopy;  Laterality: N/A;  LATEX sensitivity  . IR GENERIC HISTORICAL  07/07/2016   IR VENOGRAM RENAL UNI LEFT 07/07/2016 Aletta Edouard, MD [facility]  . IR GENERIC HISTORICAL  07/07/2016   IR VENOUS SAMPLING 07/07/2016 Aletta Edouard, MD [facility]  . IR GENERIC HISTORICAL  07/07/2016   IR VENOGRAM ADRENAL BI 07/07/2016 Aletta Edouard, MD [facility]  . IR GENERIC HISTORICAL  07/07/2016   IR US GUIDE VASC ACCESS RIGHT 07/07/2016 Aletta Edouard, MD [facility]  . IR GENERIC HISTORICAL  07/07/2016   IR ANGIOGRAM SELECTIVE EACH ADDITIONAL VESSEL 07/07/2016 Aletta Edouard, MD [facility]  . IR GENERIC HISTORICAL  07/07/2016   IR VENOUS SAMPLING 07/07/2016 Aletta Edouard, MD [facility]  . IR GENERIC HISTORICAL  07/07/2016   IR US GUIDE VASC ACCESS RIGHT 07/07/2016 Aletta Edouard, MD [facility]  . LAPAROSCOPIC SUPRACERVICAL HYSTERECTOMY  09/27/2010   For endometriosis. Ovaries and tubes are in place.  Marland Kitchen NASAL SEPTUM SURGERY    . ROBOTIC ADRENALECTOMY Left 10/27/2016   Procedure: XI ROBOTIC LEFT ADRENALECTOMY;  Surgeon: Michael Boston, MD;  Location: [facility];  Service: General;  Laterality: Left;  . TONSILLECTOMY    . TUBAL LIGATION  2006  . WISDOM TOOTH EXTRACTION      Family History  Problem Relation Age of Onset  . COPD Mother   . Hypotension Mother   . Colon cancer Father   . Emphysema Father   . Cancer Father        colon  . Hyperlipidemia Sister   . Heart failure Paternal Uncle   . Heart attack Cousin 32  . Heart disease Cousin        heart attack  . Breast cancer Maternal Aunt   . Adrenal disorder Neg Hx     Social History   Socioeconomic History  . Marital status: Single    Spouse name: Not on file  . Number of children: 2  . Years of education: Not on file  . Highest education level: Bachelor's degree (e.g., BA, AB, BS)  Occupational History  . Occupation: microbiology      Employer: [employer]    Comment: [employer]   . Occupation: aplication review    Employer: [employer]    Comment: graduate school   Tobacco Use  . Smoking status: Never Smoker  . Smokeless tobacco: Never Used  Substance and Sexual Activity  . Alcohol use: Yes    Comment: social (1 drink/mo)  . Drug use: No  . Sexual activity: Yes    Partners: Male    Birth control/protection: Surgical, Condom  Other Topics Concern  . Not on file  Social History Narrative   Has one daughter in college and graduates Spring 2020 she also has  one son at home   Working two jobs, one at night and one during the day. She sleeps from 3 pm to 8:45 pm    Social Determinants of Health   Financial Resource Strain:   . Difficulty of Paying Living Expenses: Not on file  Food Insecurity:   . Worried About Charity fundraiser in the Last Year: Not on file  . Ran Out of Food in the Last Year: Not on file  Transportation Needs:   . Lack of Transportation (Medical): Not on file  . Lack of Transportation (Non-Medical): Not on file  Physical Activity:   . Days of Exercise per Week: Not on file  . Minutes of Exercise per Session: Not on file  Stress:   . Feeling of Stress : Not on file  Social Connections:   . Frequency of Communication with Friends and Family: Not on file  . Frequency of Social Gatherings with Friends and Family: Not on file  . Attends Religious Services: Not on file  . Active Member of Clubs or Organizations: Not on file  . Attends Archivist Meetings: Not on file  . Marital Status: Not on file  Intimate Partner Violence:   . Fear of Current or Ex-Partner: Not on file  . Emotionally Abused: Not on file  . Physically Abused: Not on file  . Sexually Abused: Not on file     Current Outpatient Medications:  .  ARIPiprazole (ABILIFY) 2 MG tablet, Take 1 tablet (2 mg total) by mouth every evening., Disp: 90 tablet, Rfl: 1 .  escitalopram (LEXAPRO) 20 MG tablet, Take 1 tablet (20 mg total) by  mouth daily., Disp: 90 tablet, Rfl: 1 .  fluticasone (FLONASE) 50 MCG/ACT nasal spray, Place 2 sprays into both nostrils daily., Disp: 16 g, Rfl: 6 .  hydrochlorothiazide (HYDRODIURIL) 12.5 MG tablet, Take 1 tablet (12.5 mg total) by mouth daily., Disp: 90 tablet, Rfl: 0 .  levocetirizine (XYZAL) 5 MG tablet, TAKE 1 TABLET(5 MG) BY MOUTH EVERY EVENING, Disp: 90 tablet, Rfl: 3 .  methylphenidate 27 MG PO CR tablet, Take 1 tablet (27 mg total) by mouth every morning., Disp: 30 tablet, Rfl: 0 .  metoprolol succinate (TOPROL-XL) 50 MG 24 hr tablet, TAKE 1 TABLET BY MOUTH EVERY DAY TAKE WITH OR IMMEDIATELY FOLLOWING A MEAL, Disp: 90 tablet, Rfl: 1 .  topiramate (TOPAMAX) 100 MG tablet, Take 1 tablet (100 mg total) by mouth daily., Disp: 90 tablet, Rfl: 0 .  cloNIDine (CATAPRES) 0.1 MG tablet, Take 1 tablet (0.1 mg total) by mouth at bedtime. (Patient not taking: Reported on 11/12/2019), Disp: 90 tablet, Rfl: 0 .  methylphenidate 27 MG PO CR tablet, Take 1 tablet (27 mg total) by mouth daily., Disp: 30 tablet, Rfl: 0 .  methylphenidate 27 MG PO CR tablet, Take 1 tablet (27 mg total) by mouth every morning., Disp: 30 tablet, Rfl: 0 .  montelukast (SINGULAIR) 10 MG tablet, TAKE 1 TABLET(10 MG) BY MOUTH AT BEDTIME (Patient not taking: Reported on 11/12/2019), Disp: 90 tablet, Rfl: 3  Allergies  Allergen Reactions  . Ace Inhibitors Cough  . Latex Itching    Gloves, condoms    I personally reviewed active problem list, medication list, allergies, family history, social history, health maintenance with the patient/caregiver today.   ROS  Constitutional: Negative for fever , positive for weight change.  Respiratory: Negative for cough and shortness of breath.   Cardiovascular: Negative for chest pain or palpitations.  Gastrointestinal: Negative for abdominal pain, no bowel changes.  Musculoskeletal: Negative for gait problem or joint swelling.  Skin: Negative for rash.  Neurological: Negative for  dizziness or headache.  No other specific complaints in a complete review of systems (except as listed in HPI above).\  Objective  Virtual encounter,  vitals obtained at home  Today's Vitals   02/06/20 0859  BP: 120/78  Pulse: 88  Temp: (!) 97.5 F (36.4 C)  Weight: 217 lb (98.4 kg)   Body mass index is 33.99 kg/m.   Physical Exam  Awake, alert and oriented  PHQ2/9: Depression screen [facility] 2/9 02/06/2020 10/27/2019 07/21/2019 05/07/2019 04/18/2019  Decreased Interest 0 0 3 0 0  Down, Depressed, Hopeless 0 0 3 0 0  PHQ - 2 Score 0 0 6 0 0  Altered sleeping 3 0 3 0 0  Tired, decreased energy 3 0 2 0 2  Change in appetite 1 0 2 0 0  Feeling bad or failure about yourself  0 0 2 0 0  Trouble concentrating 3 0 2 0 0  Moving slowly or fidgety/restless 0 0 3 0 0  Suicidal thoughts 0 0 3 0 0  PHQ-9 Score 10 0 23 0 2  Difficult doing work/chores Somewhat difficult - Extremely dIfficult Not difficult at all Somewhat difficult  Some recent data might be hidden   PHQ-2/9 Result is positive.    Fall Risk: Fall Risk  02/06/2020 10/27/2019 07/21/2019 05/07/2019 04/18/2019  Falls in the past year? 0 0 0 0 0  Number falls in past yr: 0 0 0 0 0  Injury with Fall? 0 0 0 0 0  Follow up - - - - -     Assessment & Plan  1. Palpitation  Rate controlled with metoprolol   2. HTN (hypertension), benign  Based on home reading it is   3. ADHD (attention deficit hyperactivity disorder), inattentive type  - methylphenidate 27 MG PO CR tablet; Take 1 tablet (27 mg total) by mouth every morning.  Dispense: 30 tablet; Refill: 0 - methylphenidate 27 MG PO CR tablet; Take 1 tablet (27 mg total) by mouth daily.  Dispense: 30 tablet; Refill: 0 - methylphenidate 27 MG PO CR tablet; Take 1 tablet (27 mg total) by mouth every morning.  Dispense: 30 tablet; Refill: 0  4. GAD (generalized anxiety disorder)   5. Pre-diabetes   6. Psychophysiological insomnia  - traZODone (DESYREL) 50 MG tablet; Take  0.5-1 tablets (25-50 mg total) by mouth at bedtime as needed for sleep.  Dispense: 30 tablet; Refill: 0  7. Migraine without aura and with status migrainosus, not intractable  - SUMAtriptan (IMITREX) 100 MG tablet; Take 1 tablet (100 mg total) by mouth every 2 (two) hours as needed for migraine. May repeat in 2 hours if headache persists or recurs.  Dispense: 10 tablet; Refill: 0  8. Essential hypertension  - hydrochlorothiazide (HYDRODIURIL) 12.5 MG tablet; Take 1 tablet (12.5 mg total) by mouth daily.  Dispense: 90 tablet; Refill: 0  9. Major depression, recurrent, chronic (HCC)  Continue medications  10. 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 Perennial allergic rhinitis  - levocetirizine (XYZAL) 5 MG tablet; Take 1 tablet (5 mg total) by mouth every evening.  Dispense: 90 tablet; Refill: 0 I discussed the assessment and treatment plan with the patient. The patient was provided an opportunity to ask questions and all were answered. The patient agreed with the plan and demonstrated an understanding of the instructions.   The patient was advised to call back or seek an in-person evaluation if the symptoms worsen or if the condition fails to improve as anticipated.  I provided 25  minutes of non-face-to-face time during this encounter.  Loistine Chance, MD

## 2020-03-11 ENCOUNTER — Other Ambulatory Visit: Payer: Self-pay | Admitting: Family Medicine

## 2020-03-11 DIAGNOSIS — F5104 Psychophysiologic insomnia: Secondary | ICD-10-CM

## 2020-03-16 ENCOUNTER — Encounter: Payer: Self-pay | Admitting: Family Medicine

## 2020-04-20 ENCOUNTER — Other Ambulatory Visit: Payer: Self-pay | Admitting: Family Medicine

## 2020-04-20 DIAGNOSIS — I10 Essential (primary) hypertension: Secondary | ICD-10-CM

## 2020-04-20 DIAGNOSIS — N951 Menopausal and female climacteric states: Secondary | ICD-10-CM

## 2020-04-20 DIAGNOSIS — R002 Palpitations: Secondary | ICD-10-CM

## 2020-04-20 DIAGNOSIS — F339 Major depressive disorder, recurrent, unspecified: Secondary | ICD-10-CM

## 2020-04-20 DIAGNOSIS — F5104 Psychophysiologic insomnia: Secondary | ICD-10-CM

## 2020-04-20 DIAGNOSIS — G43001 Migraine without aura, not intractable, with status migrainosus: Secondary | ICD-10-CM

## 2020-04-20 DIAGNOSIS — F411 Generalized anxiety disorder: Secondary | ICD-10-CM

## 2020-04-20 NOTE — Telephone Encounter (Signed)
Requested Prescriptions  Pending Prescriptions Disp Refills  . escitalopram (LEXAPRO) 20 MG tablet [Pharmacy Med Name: ESCITALOPRAM 20MG TABLETS] 90 tablet 0    Sig: TAKE 1 TABLET(20 MG) BY MOUTH DAILY     Psychiatry:  Antidepressants - SSRI Passed - 04/20/2020  4:49 PM      Passed - Completed PHQ-2 or PHQ-9 in the last 360 days.      Passed - Valid encounter within last 6 months    Recent Outpatient Visits          2 months ago [facility] [place], Drue Stager, MD   5 months ago ADHD (attention deficit hyperactivity disorder), inattentive type   [facility] Steele Sizer, MD   9 months ago Major depression, recurrent, chronic [facility])   [facility] Steele Sizer, MD   11 months ago Oral aphthous ulcer   [place], [place]   1 year ago [facility] Steele Sizer, MD

## 2020-06-15 ENCOUNTER — Other Ambulatory Visit: Payer: Self-pay

## 2020-06-15 ENCOUNTER — Encounter: Payer: Self-pay | Admitting: Family Medicine

## 2020-06-15 ENCOUNTER — Ambulatory Visit: Payer: Medicaid Other | Admitting: Family Medicine

## 2020-06-15 VITALS — BP 140/110 | HR 72 | Temp 97.3°F | Resp 16 | Ht 66.75 in | Wt 224.0 lb

## 2020-06-15 DIAGNOSIS — F900 Attention-deficit hyperactivity disorder, predominantly inattentive type: Secondary | ICD-10-CM

## 2020-06-15 DIAGNOSIS — F5104 Psychophysiologic insomnia: Secondary | ICD-10-CM

## 2020-06-15 DIAGNOSIS — I10 Essential (primary) hypertension: Secondary | ICD-10-CM | POA: Diagnosis not present

## 2020-06-15 DIAGNOSIS — R7303 Prediabetes: Secondary | ICD-10-CM

## 2020-06-15 DIAGNOSIS — E559 Vitamin D deficiency, unspecified: Secondary | ICD-10-CM

## 2020-06-15 DIAGNOSIS — R739 Hyperglycemia, unspecified: Secondary | ICD-10-CM

## 2020-06-15 DIAGNOSIS — N951 Menopausal and female climacteric states: Secondary | ICD-10-CM

## 2020-06-15 DIAGNOSIS — F411 Generalized anxiety disorder: Secondary | ICD-10-CM

## 2020-06-15 DIAGNOSIS — J3089 Other allergic rhinitis: Secondary | ICD-10-CM

## 2020-06-15 DIAGNOSIS — F339 Major depressive disorder, recurrent, unspecified: Secondary | ICD-10-CM | POA: Diagnosis not present

## 2020-06-15 DIAGNOSIS — R002 Palpitations: Secondary | ICD-10-CM

## 2020-06-15 DIAGNOSIS — E896 Postprocedural adrenocortical (-medullary) hypofunction: Secondary | ICD-10-CM | POA: Diagnosis not present

## 2020-06-15 DIAGNOSIS — R600 Localized edema: Secondary | ICD-10-CM

## 2020-06-15 DIAGNOSIS — G43001 Migraine without aura, not intractable, with status migrainosus: Secondary | ICD-10-CM

## 2020-06-15 MED ORDER — METHYLPHENIDATE HCL ER (OSM) 27 MG PO TBCR: 27.0000 mg | EXTENDED_RELEASE_TABLET | Freq: Every day | ORAL | 0 refills | Status: DC

## 2020-06-15 MED ORDER — CLONIDINE HCL 0.1 MG PO TABS: 0.1000 mg | ORAL_TABLET | Freq: Every day | ORAL | 1 refills | Status: DC

## 2020-06-15 MED ORDER — METHYLPHENIDATE HCL ER (OSM) 27 MG PO TBCR: 27.0000 mg | EXTENDED_RELEASE_TABLET | ORAL | 0 refills | Status: DC

## 2020-06-15 MED ORDER — TOPIRAMATE 100 MG PO TABS: 100.0000 mg | ORAL_TABLET | Freq: Every evening | ORAL | 1 refills | Status: DC

## 2020-06-15 MED ORDER — ARIPIPRAZOLE 2 MG PO TABS: 2.0000 mg | ORAL_TABLET | Freq: Every evening | ORAL | 1 refills | Status: DC

## 2020-06-15 MED ORDER — NURTEC 75 MG PO TBDP: 1.0000 | ORAL_TABLET | Freq: Every day | ORAL | 2 refills | Status: DC | PRN

## 2020-06-15 MED ORDER — ESCITALOPRAM OXALATE 20 MG PO TABS: 20.0000 mg | ORAL_TABLET | Freq: Every day | ORAL | 0 refills | Status: DC

## 2020-06-15 MED ORDER — PREDNISONE 20 MG PO TABS: 20.0000 mg | ORAL_TABLET | Freq: Two times a day (BID) | ORAL | 0 refills | Status: DC

## 2020-06-15 MED ORDER — METOPROLOL SUCCINATE ER 50 MG PO TB24: ORAL_TABLET | ORAL | 0 refills | Status: DC

## 2020-06-15 MED ORDER — LEVOCETIRIZINE DIHYDROCHLORIDE 5 MG PO TABS: 5.0000 mg | ORAL_TABLET | Freq: Every evening | ORAL | 1 refills | Status: DC

## 2020-06-15 MED ORDER — HYDROCHLOROTHIAZIDE 12.5 MG PO TABS: 12.5000 mg | ORAL_TABLET | Freq: Every day | ORAL | 0 refills | Status: DC

## 2020-06-15 MED ORDER — SUMATRIPTAN SUCCINATE 100 MG PO TABS: 100.0000 mg | ORAL_TABLET | ORAL | 0 refills | Status: DC | PRN

## 2020-06-15 NOTE — Progress Notes (Signed)
Name: Amanda Fields   MRN: [id number]    DOB: Mar 19, 1976   Date:06/15/2020       Progress Note  Subjective  Chief Complaint  Chief Complaint  Patient presents with  . ADHD  . Depression  . Hypertension  . Anxiety  . Headache    She has been had a very bad headache since Friday. She has been out of work since Thursday. Sensitive to light and noise.    HPI  History of secondary hypertension, HTN : she had adrenalectomy in 2017 and bp has been going down since, we stopped spironolactonelast year,she is now onMetoprolol because of palpitation, also taking HCT and clonidine at night, bp has been at goal, however spiking today, she is pain from a migraine and depression is higher, we may need to resume spironolactone , we will recheck with cma visit in one week   Obesity: she has gained another 7 lbs she is frustrated with weight gain, she states changed her diet, she has been physically active and doesn't understand why her weight is going up   ADD: she has been on Concertaforabout 5years, she denies side effects of medication, diagnosed by previous pcp( Dr. Manuella Ghazi )with a screening test. She was doing well with medication. 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 Shehas been cooking at home, eating salads, fish, chicken, and she has been walking her dog daily   Major Depression and Anxiety:she was feeling better on Lexapro however, in May the lover or her life, that she dated on and off for a long time was killed in his home, shewasStruggling andPhq 9wasvery highAugust 2020,we added low dose Ability because , she did well for a while, but an out of medication , also started a new job in April and does not like it,  phq 9 and GAD 7 are both high, we will resume Abilify and she is currently looking for another job   Migraine headaches: sheis taking Topamax most night  She states migraine episodes starts on nuchal area, and radiates forward. It is sharp, associated with nausea , phonophobia and photophobia. This episode started around 2 am , woke her up on the night from Thursday to Friday, she took Imitrex but symptoms did not resolved, she vomited on Saturday, has been taking Excedrin migraine but still has photophobia , pain is all over her head, throbbing and currently down to 7/10. She has missed work since last Friday  AR: she sates she has nasal congestion, rhinorrhea, some post-nasal drainage. Using Flonase, Xyzal but she stopped singulair on her own.Grass bothers her   Lower extremity edema: getting much worse, both legs, we will check labs and refer to vascular surgeon   Insomnia: she falls asleep but unable to stay asleep, we added Trazodone on her last visit but did not work, she will try resuming Abilify to see if she can rest again   Patient Active Problem List   Diagnosis Date Noted  . 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 Cervical dysplasia 11/24/2019  . Hypertension due to endocrine disorder 12/10/2018  . Pre-diabetes 09/27/2018  . Prolapsed internal hemorrhoids, grade 2 08/28/2018  . History of endometriosis 04/10/2018  . GAD (generalized anxiety disorder) 04/10/2018  .  Mild recurrent major depression ([place]) 04/10/2018  . Obesity (BMI 30.0-34.9) 04/10/2018  . ADHD (attention deficit hyperactivity disorder), 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  . Family history of malignant neoplasm of gastrointestinal tract   . 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 Allergic rhinitis 02/04/2016  . Bilateral leg edema 04/09/2012    Past Surgical History:  Procedure Laterality Date  . COLONOSCOPY WITH PROPOFOL N/A 04/09/2017   Procedure: COLONOSCOPY WITH PROPOFOL;  Surgeon: Lucilla Lame, MD;  Location:  [facility];  Service: Endoscopy;  Laterality: N/A;  LATEX sensitivity  . IR GENERIC HISTORICAL  07/07/2016   IR VENOGRAM RENAL UNI LEFT 07/07/2016 Aletta Edouard, MD [facility]  . IR GENERIC HISTORICAL  07/07/2016   IR VENOUS SAMPLING 07/07/2016 Aletta Edouard, MD [facility]  . IR GENERIC HISTORICAL  07/07/2016   IR VENOGRAM ADRENAL BI 07/07/2016 Aletta Edouard, MD [facility]  . IR GENERIC HISTORICAL  07/07/2016   IR US GUIDE VASC ACCESS RIGHT 07/07/2016 Aletta Edouard, MD [facility]  . IR GENERIC HISTORICAL  07/07/2016   IR ANGIOGRAM SELECTIVE EACH ADDITIONAL VESSEL 07/07/2016 Aletta Edouard, MD [facility]  . IR GENERIC HISTORICAL  07/07/2016   IR VENOUS SAMPLING 07/07/2016 Aletta Edouard, MD [facility]  . IR GENERIC HISTORICAL  07/07/2016   IR US GUIDE VASC ACCESS RIGHT 07/07/2016 Aletta Edouard, MD [facility]  . LAPAROSCOPIC SUPRACERVICAL HYSTERECTOMY  09/27/2010   For endometriosis. Ovaries and tubes are in place.  [facility] Kitchen NASAL SEPTUM SURGERY    . ROBOTIC ADRENALECTOMY Left 10/27/2016   Procedure: XI ROBOTIC LEFT ADRENALECTOMY;  Surgeon: Michael Boston, MD;  Location: [facility];  Service: General;  Laterality: Left;  . TONSILLECTOMY    . TUBAL LIGATION  2006  . WISDOM TOOTH EXTRACTION      Family History  Problem Relation Age of Onset  . COPD Mother   . Hypotension Mother   . Colon cancer Father   . Emphysema Father   . Cancer Father        colon  . Hyperlipidemia Sister   . Heart failure Paternal Uncle   . Heart attack Cousin 32  . Heart disease Cousin        heart attack  . Breast cancer Maternal Aunt   . Adrenal disorder Neg Hx     Social History   Tobacco Use  . Smoking status: Never Smoker  . Smokeless tobacco: Never Used  Substance Use Topics  . Alcohol use: Yes    Comment: social (1 drink/mo)     Current Outpatient Medications:  .  ARIPiprazole (ABILIFY) 2 MG tablet, Take 1 tablet (2 mg total) by mouth every evening., Disp: 90 tablet, Rfl: 1 .   cloNIDine (CATAPRES) 0.1 MG tablet, Take 1 tablet (0.1 mg total) by mouth daily., Disp: 90 tablet, Rfl: 1 .  escitalopram (LEXAPRO) 20 MG tablet, Take 1 tablet (20 mg total) by mouth daily., Disp: 90 tablet, Rfl: 0 .  fluticasone (FLONASE) 50 MCG/ACT nasal spray, Place 2 sprays into both nostrils daily., Disp: 16 g, Rfl: 6 .  hydrochlorothiazide (HYDRODIURIL) 12.5 MG tablet, Take 1 tablet (12.5 mg total) by mouth daily., Disp: 90 tablet, Rfl: 0 .  levocetirizine (XYZAL) 5 MG tablet, Take 1 tablet (5 mg total) by mouth every evening., Disp: 90 tablet, Rfl: 1 .  methylphenidate 27 MG PO CR tablet, Take 1 tablet (27 mg total) by mouth every morning., Disp: 30 tablet, Rfl: 0 .  methylphenidate 27 MG PO CR tablet, Take 1 tablet (27 mg total)  by mouth daily., Disp: 30 tablet, Rfl: 0 .  methylphenidate 27 MG PO CR tablet, Take 1 tablet (27 mg total) by mouth every morning., Disp: 30 tablet, Rfl: 0 .  metoprolol succinate (TOPROL-XL) 50 MG 24 hr tablet, TAKE 1 TABLET BY MOUTH DAILY WITH OR IMMEDIATELY FOLLOWING A MEAL, Disp: 90 tablet, Rfl: 0 .  SUMAtriptan (IMITREX) 100 MG tablet, Take 1 tablet (100 mg total) by mouth every 2 (two) hours as needed for migraine. May repeat in 2 hours if headache persists or recurs., Disp: 10 tablet, Rfl: 0 .  topiramate (TOPAMAX) 100 MG tablet, Take 1 tablet (100 mg total) by mouth every evening., Disp: 90 tablet, Rfl: 1 .  predniSONE (DELTASONE) 20 MG tablet, Take 1 tablet (20 mg total) by mouth 2 (two) times daily with a meal., Disp: 6 tablet, Rfl: 0 .  Rimegepant Sulfate (NURTEC) 75 MG TBDP, Take 1 tablet by mouth daily as needed., Disp: 9 tablet, Rfl: 2  Allergies  Allergen Reactions  . Ace Inhibitors Cough  . Latex Itching    Gloves, condoms    I personally reviewed active problem list, medication list, allergies, family history with the patient/caregiver today.   ROS  Constitutional: Negative for fever , positive for weight change.  Respiratory: Negative for  cough and shortness of breath.   Cardiovascular: Negative for chest pain or palpitations.  Gastrointestinal: Negative for abdominal pain, no bowel changes.  Musculoskeletal: Negative for gait problem or joint swelling.  Skin: Negative for rash.  Neurological: Negative for dizziness , positive for  headache.  No other specific complaints in a complete review of systems (except as listed in HPI above).  Objective  Vitals:   06/15/20 0823  BP: (!) 146/108  Pulse: 72  Resp: 16  Temp: (!) 97.3 F (36.3 C)  TempSrc: Temporal  SpO2: 98%  Weight: 224 lb (101.6 kg)  Height: 5' 6.75" (1.695 m)    Body mass index is 35.35 kg/m.  Physical Exam  Constitutional: Patient appears well-developed and well-nourished. Obese No distress.  HEENT: head atraumatic, normocephalic, pupils equal and reactive to light, neck supple Cardiovascular: Normal rate, regular rhythm and normal heart sounds.  No murmur heard. 2 plus pitting  BLE edema on ankles  Pulmonary/Chest: Effort normal and breath sounds normal. No respiratory distress. Abdominal: Soft.  There is no tenderness. Neurological: cranial nerves intact, photophobia. Normal strength  Psychiatric: Patient has a normal mood and affect. behavior is normal. Judgment and thought content normal.  PHQ2/9: Depression screen [facility] 2/9 06/15/2020 02/06/2020 10/27/2019 07/21/2019 05/07/2019  Decreased Interest 1 0 0 3 0  Down, Depressed, Hopeless 2 0 0 3 0  PHQ - 2 Score 3 0 0 6 0  Altered sleeping 3 3 0 3 0  Tired, decreased energy 2 3 0 2 0  Change in appetite 2 1 0 2 0  Feeling bad or failure about yourself  2 0 0 2 0  Trouble concentrating 1 3 0 2 0  Moving slowly or fidgety/restless 0 0 0 3 0  Suicidal thoughts 1 0 0 3 0  PHQ-9 Score 14 10 0 23 0  Difficult doing work/chores Somewhat difficult Somewhat difficult - Extremely dIfficult Not difficult at all  Some recent data might be hidden    phq 9 is positive  GAD 7 : Generalized Anxiety Score  06/15/2020 10/27/2019 07/21/2019 05/07/2019  Nervous, Anxious, on Edge 2 0 3 0  Control/stop worrying 2 0 3 0  Worry too much - different  things 3 0 3 0  Trouble relaxing 3 3 3  0  Restless 2 3 3  0  Easily annoyed or irritable 3 2 3  0  Afraid - awful might happen 2 0 3 0  Total GAD 7 Score 17 8 21  0  Anxiety Difficulty - Somewhat difficult Extremely difficult Not difficult at all   Positive  Fall Risk: Fall Risk  06/15/2020 02/06/2020 10/27/2019 07/21/2019 05/07/2019  Falls in the past year? 0 0 0 0 0  Number falls in past yr: 0 0 0 0 0  Injury with Fall? 0 0 0 0 0  Follow up - - - - -     Assessment & Plan  1. HTN (hypertension), benign  - metoprolol succinate (TOPROL-XL) 50 MG 24 hr tablet; TAKE 1 TABLET BY MOUTH DAILY WITH OR IMMEDIATELY FOLLOWING A MEAL  Dispense: 90 tablet; Refill: 0  2. Major depression, recurrent, chronic (HCC)  - ARIPiprazole (ABILIFY) 2 MG tablet; Take 1 tablet (2 mg total) by mouth every evening.  Dispense: 90 tablet; Refill: 1 - escitalopram (LEXAPRO) 20 MG tablet; Take 1 tablet (20 mg total) by mouth daily.  Dispense: 90 tablet; Refill: 0  3. ADHD (attention deficit hyperactivity disorder), inattentive type  Hold off on taking medication until bp improves, she has been out for weeks now - methylphenidate 27 MG PO CR tablet; Take 1 tablet (27 mg total) by mouth every morning.  Dispense: 30 tablet; Refill: 0 - methylphenidate 27 MG PO CR tablet; Take 1 tablet (27 mg total) by mouth daily.  Dispense: 30 tablet; Refill: 0 - methylphenidate 27 MG PO CR tablet; Take 1 tablet (27 mg total) by mouth every morning.  Dispense: 30 tablet; Refill: 0  4. 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 Pre-diabetes  Recheck A1C  6. Essential hypertension  - hydrochlorothiazide (HYDRODIURIL) 12.5 MG tablet; Take 1 tablet (12.5 mg total) by mouth daily.  Dispense: 90 tablet; Refill: 0 - Lipid panel - COMPLETE METABOLIC PANEL WITH  GFR - CBC with Differential/Platelet - TSH - Aldosterone + renin activity w/ ratio  7. Perennial allergic rhinitis  - levocetirizine (XYZAL) 5 MG tablet; Take 1 tablet (5 mg total) by mouth every evening.  Dispense: 90 tablet; Refill: 1  8. Migraine without aura and with status migrainosus, not intractable  - predniSONE (DELTASONE) 20 MG tablet; Take 1 tablet (20 mg total) by mouth 2 (two) times daily with a meal.  Dispense: 6 tablet; Refill: 0 - Rimegepant Sulfate (NURTEC) 75 MG TBDP; Take 1 tablet by mouth daily as needed.  Dispense: 9 tablet; Refill: 2 - topiramate (TOPAMAX) 100 MG tablet; Take 1 tablet (100 mg total) by mouth every evening.  Dispense: 90 tablet; Refill: 1 - SUMAtriptan (IMITREX) 100 MG tablet; Take 1 tablet (100 mg total) by mouth every 2 (two) hours as needed for migraine. May repeat in 2 hours if headache persists or recurs.  Dispense: 10 tablet; Refill: 0  9. GAD (generalized anxiety disorder)  - escitalopram (LEXAPRO) 20 MG tablet; Take 1 tablet (20 mg total) by mouth daily.  Dispense: 90 tablet; Refill: 0  10. Hyperglycemia  - Hemoglobin A1c  11. Palpitation  - metoprolol succinate (TOPROL-XL) 50 MG 24 hr tablet; TAKE 1 TABLET BY MOUTH DAILY WITH OR IMMEDIATELY FOLLOWING A MEAL  Dispense: 90 tablet; Refill: 0  12. Psychophysiological insomnia  - cloNIDine (CATAPRES) 0.1 MG tablet; Take 1 tablet (0.1 mg total) by mouth daily.  Dispense: 90 tablet; Refill: 1 - ARIPiprazole (ABILIFY) 2 MG tablet; Take 1 tablet (2 mg total) by mouth every evening.  Dispense: 90 tablet; Refill: 1  13. Hot flashes due to menopause  - cloNIDine (CATAPRES) 0.1 MG tablet; Take 1 tablet (0.1 mg total) by mouth daily.  Dispense: 90 tablet; Refill: 1 - escitalopram (LEXAPRO) 20 MG tablet; Take 1 tablet (20 mg total) by mouth daily.  Dispense: 90 tablet; Refill: 0  14. Bilateral lower extremity edema  - Ambulatory referral to Vascular Surgery  15. Vitamin D deficiency  -  VITAMIN D 25 Hydroxy (Vit-D Deficiency, Fractures)

## 2020-06-22 LAB — COMPLETE METABOLIC PANEL WITH GFR
AG Ratio: 1.4 (calc) (ref 1.0–2.5)
ALT: 19 U/L (ref 6–29)
AST: 24 U/L (ref 10–35)
Albumin: 4.3 g/dL (ref 3.6–5.1)
Alkaline phosphatase (APISO): 54 U/L (ref 31–125)
BUN: 8 mg/dL (ref 7–25)
CO2: 30 mmol/L (ref 20–32)
Calcium: 9.5 mg/dL (ref 8.6–10.2)
Chloride: 103 mmol/L (ref 98–110)
Creat: 1.04 mg/dL (ref 0.50–1.10)
GFR, Est African American: 75 mL/min/{1.73_m2} (ref >=60)
GFR, Est Non African American: 64 mL/min/{1.73_m2} (ref >=60)
Globulin: 3 g/dL (calc) (ref 1.9–3.7)
Glucose, Bld: 113 mg/dL — ABNORMAL HIGH (ref 65–99)
Potassium: 3.9 mmol/L (ref 3.5–5.3)
Sodium: 140 mmol/L (ref 135–146)
Total Bilirubin: 0.4 mg/dL (ref 0.2–1.2)
Total Protein: 7.3 g/dL (ref 6.1–8.1)

## 2020-06-22 LAB — CBC WITH DIFFERENTIAL/PLATELET
Absolute Monocytes: 330 cells/uL (ref 200–950)
Basophils Absolute: 59 cells/uL (ref 0–200)
Basophils Relative: 1 %
Eosinophils Absolute: 301 cells/uL (ref 15–500)
Eosinophils Relative: 5.1 %
HCT: 44.2 % (ref 35.0–45.0)
Hemoglobin: 14.3 g/dL (ref 11.7–15.5)
Lymphs Abs: 1835 cells/uL (ref 850–3900)
MCH: 26.5 pg — ABNORMAL LOW (ref 27.0–33.0)
MCHC: 32.4 g/dL (ref 32.0–36.0)
MCV: 82 fL (ref 80.0–100.0)
MPV: 11 fL (ref 7.5–12.5)
Monocytes Relative: 5.6 %
Neutro Abs: 3375 cells/uL (ref 1500–7800)
Neutrophils Relative %: 57.2 %
Platelets: 291 10*3/uL (ref 140–400)
RBC: 5.39 10*6/uL — ABNORMAL HIGH (ref 3.80–5.10)
RDW: 14.3 % (ref 11.0–15.0)
Total Lymphocyte: 31.1 %
WBC: 5.9 10*3/uL (ref 3.8–10.8)

## 2020-06-22 LAB — LIPID PANEL
Cholesterol: 184 mg/dL (ref <200)
HDL: 47 mg/dL — ABNORMAL LOW (ref >=50)
LDL Cholesterol (Calc): 115 mg/dL (calc) — ABNORMAL HIGH
Non-HDL Cholesterol (Calc): 137 mg/dL (calc) — ABNORMAL HIGH (ref <130)
Total CHOL/HDL Ratio: 3.9 (calc) (ref <5.0)
Triglycerides: 113 mg/dL (ref <150)

## 2020-06-22 LAB — HEMOGLOBIN A1C
Hgb A1c MFr Bld: 5.9 % of total Hgb — ABNORMAL HIGH (ref <5.7)
Mean Plasma Glucose: 123 (calc)
eAG (mmol/L): 6.8 (calc)

## 2020-06-22 LAB — ALDOSTERONE + RENIN ACTIVITY W/ RATIO
ALDO / PRA Ratio: 20.9 Ratio (ref 0.9–28.9)
Aldosterone: 9 ng/dL
Renin Activity: 0.43 ng/mL/h (ref 0.25–5.82)

## 2020-06-22 LAB — TSH: TSH: 2.35 mIU/L

## 2020-06-22 LAB — VITAMIN D 25 HYDROXY (VIT D DEFICIENCY, FRACTURES): Vit D, 25-Hydroxy: 23 ng/mL — ABNORMAL LOW (ref 30–100)

## 2020-07-22 DIAGNOSIS — Z1152 Encounter for screening for COVID-19: Secondary | ICD-10-CM | POA: Diagnosis not present

## 2020-08-04 ENCOUNTER — Ambulatory Visit: Payer: Self-pay

## 2020-08-04 NOTE — Progress Notes (Signed)
Patient ID: Amanda Fields, female    DOB: 20-May-1976, 44 y.o.   MRN: [id number]  PCP: Steele Sizer, MD  Chief Complaint  Patient presents with  . Palpitations    Tuesday 8/10 she felt her heart racing when she was laying down, she has not been able to sleep since, feels like heart racing keeps her up    Subjective:   Amanda Fields is a 44 y.o. female, presents to clinic with CC of the following:  Chief Complaint  Patient presents with  . Palpitations    Tuesday 8/10 she felt her heart racing when she was laying down, she has not been able to sleep since, feels like heart racing keeps her up    HPI:  Patient is a 44 year old female patient of Dr. Ancil Boozer Last visit with Dr. Ancil Boozer was 06/15/2020 Numerous labs were checked at that last visit and were reviewed Follows up today with palpitation concerns  She was seen at an urgent care 07/22/2020 after a Covid exposure concern, was asymptomatic, and rapid Covid test done was negative.  On review, she is noted to have a history of secondary hypertension, had an adrenalectomy in 2017 with spironolactone able to be stopped in 2020, and was noted to be on metoprolol " because of palpitation".  She remains on the metoprolol as well as the clonidine and hydrochlorothiazide for her blood pressure. She notes these are the only medicines she is taking regularly presently.  She also has an ADD history has been on Concerta for about 5 years (diagnosed by a previous PCP with a screening test) and denied major side effects from that medication in the past.  She noted last visit that she had been out of that medicine for a couple weeks, and was recommended to continue to not take due to blood pressure concerns at that visit.  She has returned to taking Concerta product since that last visit with Dr. Ancil Boozer, takes two- three times a week. Took med once last week, not Tues, not taken at all this week.  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 Ancil Boozer on her last visit in June.  She also tried trazodone for insomnia at one point, but noted it did not work. Not taking Lexapro or Abilify in last couple months. No therapist or counselor recently involved.  She quit the prior job 07/01/20.  Stresses now increased as has no job, starting a new job the 30th and notes will help, working from home.  Noted always has problems falling asleep and with insomnia, and that has continued, and noted heart racing at times when unable to sleep could be more than the issue then the heart racing actually waking her up at night.  Now not taking Topamax, has Imitrex to use as needed and when that was not helping, used Excedrin Migraine products noted on last visit with Dr. Ancil Boozer.   Also at that last visit, her leg swelling was noted to be getting worse, and labs were to be ordered and a referral to vascular surgeon was provided. Did not go. Better now since quit job.   She presents today noting her palpitations have been more noticeable again.  She noted it first started Tuesday a week ago, with the feelings of her heart racing when she was laying  down.  Only happening overnight after lays down. No problems during the day.  After the difficulty overnight Tuesday a week ago, she was doing okay until Tuesday of this week when she noticed again her symptoms overnight were more bothersome. Described feeling like heart beating thru her chest, can hear heart beating No marked CP's, no pains up to jaw or down the arms no obvious precipitant identified She has not had fevers, but did note her sinus congestion and postnasal drip has persistently been problematic, with sinus drainage at night and also the congestion and has a deviated septum.  She often gets headaches from this.  She notes this has been present for a couple  months now.  She does have a Flonase product intake and uses once daily.  No bad sinus pains. No N/V, no increased sweatiness although does get hot flashes at times, no SOB, no near passing out feelings No increased bleeding with no dark or black stools  No h/o significant cardiac disease, heart arrythmias + h/o HTN as above, no history of DM with last A1c in the prediabetic range, has mild hyperlipidemia noted on last check, no history of thyroid disease, + h/o anemia with iron def noted in her past.  Last CBC was not anemic. No marked diet change recent past, tries to stay hydrated  No tobacco history Alcohol use -rare Caffeine use - denies any significant use   Activity level - walk every day, no sx's with exertion  FH - negative for early deaths from HD, arrhythmias  No new meds or supplements in recent past,     Patient Active Problem List   Diagnosis Date Noted  . 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 Cervical dysplasia 11/24/2019  . Hypertension due to endocrine disorder 12/10/2018  . Pre-diabetes 09/27/2018  . Prolapsed internal hemorrhoids, grade 2 08/28/2018  . History of endometriosis 04/10/2018  . GAD (generalized anxiety disorder) 04/10/2018  . Mild recurrent major depression ([place]) 04/10/2018  . Obesity (BMI 30.0-34.9) 04/10/2018  . ADHD (attention deficit hyperactivity disorder), 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  . Family history of malignant neoplasm of gastrointestinal tract   . 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 Allergic rhinitis 02/04/2016  . Bilateral leg edema 04/09/2012      Current Outpatient Medications:  .  ARIPiprazole (ABILIFY) 2 MG tablet, Take 1 tablet (2 mg total) by mouth every evening., Disp: 90 tablet, Rfl: 1 .  cloNIDine (CATAPRES) 0.1 MG tablet, Take 1 tablet (0.1 mg total) by mouth daily., Disp: 90 tablet, Rfl: 1 .  escitalopram (LEXAPRO) 20 MG tablet, Take 1 tablet (20 mg total) by mouth daily., Disp: 90 tablet, Rfl: 0 .  fluticasone  (FLONASE) 50 MCG/ACT nasal spray, Place 2 sprays into both nostrils daily., Disp: 16 g, Rfl: 6 .  hydrochlorothiazide (HYDRODIURIL) 12.5 MG tablet, Take 1 tablet (12.5 mg total) by mouth daily., Disp: 90 tablet, Rfl: 0 .  levocetirizine (XYZAL) 5 MG tablet, Take 1 tablet (5 mg total) by mouth every evening., Disp: 90 tablet, Rfl: 1 .  methylphenidate 27 MG PO CR tablet, Take 1 tablet (27 mg total) by mouth every morning., Disp: 30 tablet, Rfl: 0 .  methylphenidate 27 MG PO CR tablet, Take 1 tablet (27 mg total) by mouth daily., Disp: 30 tablet, Rfl: 0 .  methylphenidate 27 MG PO CR tablet, Take 1 tablet (27 mg total) by mouth every morning., Disp: 30 tablet, Rfl: 0 .  metoprolol succinate (TOPROL-XL) 50 MG 24 hr tablet, TAKE 1  TABLET BY MOUTH DAILY WITH OR IMMEDIATELY FOLLOWING A MEAL, Disp: 90 tablet, Rfl: 0 .  SUMAtriptan (IMITREX) 100 MG tablet, Take 1 tablet (100 mg total) by mouth every 2 (two) hours as needed for migraine. May repeat in 2 hours if headache persists or recurs., Disp: 10 tablet, Rfl: 0 .  topiramate (TOPAMAX) 100 MG tablet, Take 1 tablet (100 mg total) by mouth every evening., Disp: 90 tablet, Rfl: 1   Allergies  Allergen Reactions  . Ace Inhibitors Cough  . Latex Itching    Gloves, condoms  . Pollen Extract      Past Surgical History:  Procedure Laterality Date  . COLONOSCOPY WITH PROPOFOL N/A 04/09/2017   Procedure: COLONOSCOPY WITH PROPOFOL;  Surgeon: Lucilla Lame, MD;  Location: [facility];  Service: Endoscopy;  Laterality: N/A;  LATEX sensitivity  . IR GENERIC HISTORICAL  07/07/2016   IR VENOGRAM RENAL UNI LEFT 07/07/2016 Aletta Edouard, MD [facility]  . IR GENERIC HISTORICAL  07/07/2016   IR VENOUS SAMPLING 07/07/2016 Aletta Edouard, MD [facility]  . IR GENERIC HISTORICAL  07/07/2016   IR VENOGRAM ADRENAL BI 07/07/2016 Aletta Edouard, MD [facility]  . IR GENERIC HISTORICAL  07/07/2016   IR US GUIDE VASC ACCESS RIGHT 07/07/2016 Aletta Edouard, MD  [facility]  . IR GENERIC HISTORICAL  07/07/2016   IR ANGIOGRAM SELECTIVE EACH ADDITIONAL VESSEL 07/07/2016 Aletta Edouard, MD [facility]  . IR GENERIC HISTORICAL  07/07/2016   IR VENOUS SAMPLING 07/07/2016 Aletta Edouard, MD [facility]  . IR GENERIC HISTORICAL  07/07/2016   IR US GUIDE VASC ACCESS RIGHT 07/07/2016 Aletta Edouard, MD [facility]  . LAPAROSCOPIC SUPRACERVICAL HYSTERECTOMY  09/27/2010   For endometriosis. Ovaries and tubes are in place.  Marland Kitchen NASAL SEPTUM SURGERY    . ROBOTIC ADRENALECTOMY Left 10/27/2016   Procedure: XI ROBOTIC LEFT ADRENALECTOMY;  Surgeon: Michael Boston, MD;  Location: [facility];  Service: General;  Laterality: Left;  . TONSILLECTOMY    . TUBAL LIGATION  2006  . WISDOM TOOTH EXTRACTION       Family History  Problem Relation Age of Onset  . COPD Mother   . Hypotension Mother   . Colon cancer Father   . Emphysema Father   . Cancer Father        colon  . Hyperlipidemia Sister   . Heart failure Paternal Uncle   . Heart attack Cousin 32  . Heart disease Cousin        heart attack  . Breast cancer Maternal Aunt   . Adrenal disorder Neg Hx      Social History   Tobacco Use  . Smoking status: Never Smoker  . Smokeless tobacco: Never Used  Substance Use Topics  . Alcohol use: Yes    Comment: social (1 drink/mo)    With staff assistance, above reviewed with the patient today.  ROS: As per HPI, otherwise no specific complaints on a limited and focused system review   No results found for this or any previous visit (from the past 72 hour(s)).   PHQ2/9: Depression screen [facility] 2/9 08/05/2020 06/15/2020 02/06/2020 10/27/2019 07/21/2019  Decreased Interest 2 1 0 0 3  Down, Depressed, Hopeless 0 2 0 0 3  PHQ - 2 Score 2 3 0 0 6  Altered sleeping 3 3 3  0 3  Tired, decreased energy 3 2 3  0 2  Change in appetite 3 2 1  0 2  Feeling bad or failure about yourself  2 2  0 0 2  Trouble concentrating 2 1 3  0 2  Moving slowly or fidgety/restless 2 0 0 0 3    Suicidal thoughts 0 1 0 0 3  PHQ-9 Score 17 14 10  0 23  Difficult doing work/chores Somewhat difficult Somewhat difficult Somewhat difficult - Extremely dIfficult  Some recent data might be hidden   PHQ-2/9 Result reviewed  GAD 7 : Generalized Anxiety Score 06/15/2020 10/27/2019 07/21/2019 05/07/2019  Nervous, Anxious, on Edge 2 0 3 0  Control/stop worrying 2 0 3 0  Worry too much - different things 3 0 3 0  Trouble relaxing 3 3 3  0  Restless 2 3 3  0  Easily annoyed or irritable 3 2 3  0  Afraid - awful might happen 2 0 3 0  Total GAD 7 Score 17 8 21  0  Anxiety Difficulty - Somewhat difficult Extremely difficult Not difficult at all    GAD7 result reviewed  Fall Risk: Fall Risk  08/05/2020 06/15/2020 02/06/2020 10/27/2019 07/21/2019  Falls in the past year? 0 0 0 0 0  Number falls in past yr: 0 0 0 0 0  Injury with Fall? 0 0 0 0 0  Follow up - - - - -      Objective:   Vitals:   08/05/20 1129  BP: 126/88  Pulse: 85  Resp: 16  Temp: 98.7 F (37.1 C)  TempSrc: Oral  SpO2: 99%  Weight: 218 lb 3.2 oz (99 kg)  Height: 5' 6.75" (1.695 m)    Body mass index is 34.43 kg/m.  Physical Exam   NAD, masked, very pleasant, not very anxious appearing today HEENT - [place]/AT, sclera anicteric, PERRL, EOMI, conj - non-inj'ed, no focal sinus tenderness, nares patent with increased erythema more on the left than right side with some minimal swelling, pharynx clear Neck - supple, no adenopathy, no TM, carotids 2+ and = without bruits bilat Car - RRR without m/g/r Pulm- RR and effort normal at rest, CTA without wheeze or rales Abd - soft, NT diffusely, obese, ND,  Back - no CVA tenderness Ext - no LE edema,  Neuro/psychiatric - affect was not flat, appropriate with conversation  Alert   Grossly non-focal - good strength on testing extremities, sensation intact to LT in distal extremities  Speech normal, not rapid   Last labs reviewed from 06/15/2020 (fairly recent) ECG done in the office  showed a normal sinus rhythm, no acute changes of concern    Assessment & Plan:    1. Chronic rhinitis Recommended increasing her Flonase to twice daily for the next 5 to 7 days, then return to once daily. Would avoid nonsedating antihistamines presently, and can use a Benadryl type product at bedtime to help, as it can make drowsy during the day.  Would take 50 mg.  Can help some with the sleep issues as well.  2. Palpitations Discussed this at length with the patient, and do feel there are many factors that could be contributing to this.  She notes she does stay well-hydrated during the days.  Has not been using the stimulant for her ADHD diagnosis much in the recent past, on noted that is a stimulant and can contribute to heart racing.  Do feel the anxiety symptoms are contributing, and she has been off her medications for this in the recent past.  The EKG done today was reassuring.  She did just have recent labs at the end of June, and discussed potentially rechecking that again today,  although agreed to hold off although may need if symptoms persist over time.  Recommended not taking further Concerta presently Return to taking the Lexapro product daily If in the next couple weeks, she notes the anxiety/depression symptoms have not been much improved, with some of the heart racing continuing, can resume the Abilify at that time per Dr. Ancil Boozer previous recommendation   3. Major depression, recurrent, chronic ([place]) 4. Generalized anxiety disorder As above, return to the Lexapro product daily, and consider adding the Abilify back in couple weeks pending her status. Also recommended having a counselor involved, and a list was given with those in the area, and recommended she contact her insurance to see which ones may be covered to help and then contacting the counseling to help set up an appointment.  Noted this can only be helpful presently   5. ADHD (attention deficit hyperactivity  disorder), inattentive type As above noted, will refrain from taking the Concerta product presently  6. HTN (hypertension), benign 7. Hx of total adrenalectomy [facility]) She notes her blood pressures have been controlled on her current regimen, and will continue.  8. Pre-diabetes Has not had a prior history of diabetes, with the last A1c consistent with prediabetes noted.  9. Psychophysiological insomnia Do feel the insomnia is a big part of her symptom complex presently, and do feel the anxiety and possible depression contributing. Resuming the Lexapro was not present presently, and to consider adding the Abilify as noted above over time from prior recommendations from Dr. Ancil Boozer Also, getting counseling involved may be helpful   Continue to monitor presently, and if does start to develop chest pains with radiation down the arm or up to the jaw, more concerning acute symptoms, she may need to be seen more emergently at that time.  Tentatively, offered to schedule a follow-up in about 4 weeks time, and she noted she would like to do so, and will do so with her PCP Dr. Ancil Boozer, and follow-up sooner as needed.     Towanda Malkin, MD 08/05/20 11:40 AM

## 2020-08-04 NOTE — Telephone Encounter (Signed)
Pt. Reports that since last Monday she has had palpitations at night only. Also had her first Moderna vaccine that day.No chest pain with this. Does note some gas and bloating. Heart rate last night 121. This morning 65. Appointment made for tomorrow. Pt. Requests if anything opens up today to please call her. Instructed to go to ED for worsening of symptoms.  TW 1  [name] Female, 44 y.o., 05/06/1976 MRN:  [id number] Phone:  [phone] (M) ... PCP:  Steele Sizer, MD Coverage:  Medicaid [place] Message from Sheran Luz sent at 08/04/2020 8:36 AM EDT  Summary: Requesting to speak with nurse, trouble sleeping    Patient requesting to speak with RN regarding issues sleeping at night x3 days. She states when she lays down it feels like her heart is racing and she cannot sleep. Patient not having these symptoms at time of call but would like to talk with nurse. Patient wanted to note that this started when she received Moderna vaccine and was not sure if it was related.         Call History   Type Contact Phone  08/04/2020 08:34 AM EDT Phone (Incoming) Amanda Fields, Amanda Fields (Self) [phone] (H)  User: Sheran Luz    Reason for Disposition . [1] Palpitations AND [2] no improvement after using CARE ADVICE  Answer Assessment - Initial Assessment Questions 1. DESCRIPTION: "Please describe your heart rate or heartbeat that you are having" (e.g., fast/slow, regular/irregular, skipped or extra beats, "palpitations")     Heart racing 2. ONSET: "When did it start?" (Minutes, hours or days)      Last Monday 3. DURATION: "How long does it last" (e.g., seconds, minutes, hours)     hOURS 4. PATTERN "Does it come and go, or has it been constant since it started?"  "Does it get worse with exertion?"   "Are you feeling it now?"     aT NIGHT 5. TAP: "Using your hand, can you tap out what you are feeling on a chair or table in front of you, so that I can hear?"  (Note: not all patients can do this)       No 6. HEART RATE: "Can you tell me your heart rate?" "How many beats in 15 seconds?"  (Note: not all patients can do this)       Last night 121,  Now 65 7. RECURRENT SYMPTOM: "Have you ever had this before?" If Yes, ask: "When was the last time?" and "What happened that time?"      No 8. CAUSE: "What do you think is causing the palpitations?"     Moderna vaccine 9. CARDIAC HISTORY: "Do you have any history of heart disease?" (e.g., heart attack, angina, bypass surgery, angioplasty, arrhythmia)      Palpitations in the past 10. OTHER SYMPTOMS: "Do you have any other symptoms?" (e.g., dizziness, chest pain, sweating, difficulty breathing)       No 11. PREGNANCY: "Is there any chance you are pregnant?" "When was your last menstrual period?"       No  Protocols used: HEART RATE AND HEARTBEAT QUESTIONS-A-AH

## 2020-08-05 ENCOUNTER — Encounter: Payer: Self-pay | Admitting: Internal Medicine

## 2020-08-05 ENCOUNTER — Other Ambulatory Visit: Payer: Self-pay

## 2020-08-05 ENCOUNTER — Ambulatory Visit (INDEPENDENT_AMBULATORY_CARE_PROVIDER_SITE_OTHER): Payer: Medicaid Other | Admitting: Internal Medicine

## 2020-08-05 VITALS — BP 126/88 | HR 85 | Temp 98.7°F | Resp 16 | Ht 66.75 in | Wt 218.2 lb

## 2020-08-05 DIAGNOSIS — F900 Attention-deficit hyperactivity disorder, predominantly inattentive type: Secondary | ICD-10-CM | POA: Diagnosis not present

## 2020-08-05 DIAGNOSIS — E896 Postprocedural adrenocortical (-medullary) hypofunction: Secondary | ICD-10-CM | POA: Diagnosis not present

## 2020-08-05 DIAGNOSIS — F339 Major depressive disorder, recurrent, unspecified: Secondary | ICD-10-CM | POA: Diagnosis not present

## 2020-08-05 DIAGNOSIS — R7303 Prediabetes: Secondary | ICD-10-CM | POA: Diagnosis not present

## 2020-08-05 DIAGNOSIS — F5104 Psychophysiologic insomnia: Secondary | ICD-10-CM

## 2020-08-05 DIAGNOSIS — R002 Palpitations: Secondary | ICD-10-CM

## 2020-08-05 DIAGNOSIS — J310 Chronic rhinitis: Secondary | ICD-10-CM | POA: Diagnosis not present

## 2020-08-05 DIAGNOSIS — I10 Essential (primary) hypertension: Secondary | ICD-10-CM | POA: Diagnosis not present

## 2020-08-05 DIAGNOSIS — F411 Generalized anxiety disorder: Secondary | ICD-10-CM

## 2020-08-05 NOTE — Patient Instructions (Signed)
Resume the Lexapro presently, consider adding the Abilify again a couple weeks after start the Lexapro if not improved  Refrain from taking the Concerta presently  Counseling rec'ed  Increase the flonase to twice daily for the next 5-7 days, then return to once daily  Can use a benadryl product at bedtime to help as needed

## 2020-08-06 ENCOUNTER — Encounter: Payer: Self-pay | Admitting: Family Medicine

## 2020-08-30 ENCOUNTER — Encounter: Payer: Self-pay | Admitting: Radiology
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
## 2020-09-07 ENCOUNTER — Ambulatory Visit: Payer: Medicaid Other | Admitting: Family Medicine

## 2020-09-07 ENCOUNTER — Other Ambulatory Visit: Payer: Self-pay

## 2020-09-07 ENCOUNTER — Encounter: Payer: Self-pay | Admitting: Family Medicine

## 2020-09-07 VITALS — BP 124/86 | HR 66 | Temp 98.0°F | Resp 16 | Ht 66.0 in | Wt 224.3 lb

## 2020-09-07 DIAGNOSIS — F411 Generalized anxiety disorder: Secondary | ICD-10-CM | POA: Diagnosis not present

## 2020-09-07 DIAGNOSIS — N951 Menopausal and female climacteric states: Secondary | ICD-10-CM

## 2020-09-07 DIAGNOSIS — F900 Attention-deficit hyperactivity disorder, predominantly inattentive type: Secondary | ICD-10-CM

## 2020-09-07 DIAGNOSIS — I10 Essential (primary) hypertension: Secondary | ICD-10-CM | POA: Diagnosis not present

## 2020-09-07 DIAGNOSIS — R002 Palpitations: Secondary | ICD-10-CM | POA: Diagnosis not present

## 2020-09-07 DIAGNOSIS — F339 Major depressive disorder, recurrent, unspecified: Secondary | ICD-10-CM

## 2020-09-07 DIAGNOSIS — G43001 Migraine without aura, not intractable, with status migrainosus: Secondary | ICD-10-CM | POA: Diagnosis not present

## 2020-09-07 MED ORDER — METHYLPHENIDATE HCL ER (OSM) 27 MG PO TBCR: 27.0000 mg | EXTENDED_RELEASE_TABLET | Freq: Every day | ORAL | 0 refills | Status: DC

## 2020-09-07 MED ORDER — HYDROCHLOROTHIAZIDE 12.5 MG PO TABS: 12.5000 mg | ORAL_TABLET | Freq: Every day | ORAL | 0 refills | Status: DC

## 2020-09-07 MED ORDER — SUMATRIPTAN SUCCINATE 100 MG PO TABS: 100.0000 mg | ORAL_TABLET | ORAL | 0 refills | Status: DC | PRN

## 2020-09-07 MED ORDER — METHYLPHENIDATE HCL ER (OSM) 27 MG PO TBCR: 27.0000 mg | EXTENDED_RELEASE_TABLET | ORAL | 0 refills | Status: DC

## 2020-09-07 MED ORDER — METOPROLOL SUCCINATE ER 50 MG PO TB24: ORAL_TABLET | ORAL | 0 refills | Status: DC

## 2020-09-07 MED ORDER — ESCITALOPRAM OXALATE 20 MG PO TABS: 20.0000 mg | ORAL_TABLET | Freq: Every day | ORAL | 0 refills | Status: DC

## 2020-10-15 ENCOUNTER — Other Ambulatory Visit: Payer: Self-pay | Admitting: Family Medicine

## 2020-10-15 DIAGNOSIS — F900 Attention-deficit hyperactivity disorder, predominantly inattentive type: Secondary | ICD-10-CM

## 2020-10-15 DIAGNOSIS — N3000 Acute cystitis without hematuria: Secondary | ICD-10-CM | POA: Diagnosis not present

## 2020-10-15 DIAGNOSIS — R350 Frequency of micturition: Secondary | ICD-10-CM | POA: Diagnosis not present

## 2020-10-15 NOTE — Telephone Encounter (Signed)
Requested medication (s) are due for refill today: yes  Requested medication (s) are on the active medication list: {yes  Last refill:  09/07/20  #30  0 refills  Future visit scheduled: yes  Notes to clinic: Not delegated    Requested Prescriptions  Pending Prescriptions Disp Refills   METHYLPHENIDATE 27 MG PO CR tablet [Pharmacy Med Name: METHYLPHENIDATE 27MG ER OSM TABLET] 30 tablet     Sig: TAKE 1 TABLET(27 MG) BY MOUTH EVERY MORNING      Not Delegated - Psychiatry:  Stimulants/ADHD Failed - 10/15/2020  3:45 PM      Failed - This refill cannot be delegated      Failed - Urine Drug Screen completed in last 360 days      Passed - Valid encounter within last 3 months    Recent Outpatient Visits           1 month ago Major depression, recurrent, chronic ([place])   [facility] Steele Sizer, MD   2 months ago Palpitations   [facility] Lebron Conners D, MD   4 months ago HTN (hypertension), benign   [facility] Steele Sizer, MD   8 months ago [facility] [place], Drue Stager, MD   11 months ago ADHD (attention deficit hyperactivity disorder), inattentive type   [facility] Steele Sizer, MD       Future Appointments             In 1 month Ancil Boozer, Drue Stager, MD [facility]

## 2020-10-18 ENCOUNTER — Other Ambulatory Visit: Payer: Self-pay

## 2020-10-18 DIAGNOSIS — F900 Attention-deficit hyperactivity disorder, predominantly inattentive type: Secondary | ICD-10-CM

## 2020-11-15 DIAGNOSIS — Z03818 Encounter for observation for suspected exposure to other biological agents ruled out: Secondary | ICD-10-CM | POA: Diagnosis not present

## 2020-11-15 DIAGNOSIS — Z20822 Contact with and (suspected) exposure to covid-19: Secondary | ICD-10-CM | POA: Diagnosis not present

## 2020-11-18 ENCOUNTER — Telehealth: Payer: Self-pay | Admitting: Family Medicine

## 2020-11-18 NOTE — Telephone Encounter (Signed)
Pt called in to make provider aware that she has tested positive for covid. Pt says that she was advised to request a referral from PCP to the [facility] in [facility] for treatment. Pt says that she wasn't told what kind of treatment to request.   Pt would like further assistance with having referral placed.

## 2020-11-19 ENCOUNTER — Other Ambulatory Visit: Payer: Self-pay

## 2020-11-19 ENCOUNTER — Telehealth (INDEPENDENT_AMBULATORY_CARE_PROVIDER_SITE_OTHER): Payer: PRIVATE HEALTH INSURANCE | Admitting: Internal Medicine

## 2020-11-19 ENCOUNTER — Encounter: Payer: Self-pay | Admitting: Internal Medicine

## 2020-11-19 DIAGNOSIS — E669 Obesity, unspecified: Secondary | ICD-10-CM

## 2020-11-19 DIAGNOSIS — F339 Major depressive disorder, recurrent, unspecified: Secondary | ICD-10-CM | POA: Diagnosis not present

## 2020-11-19 DIAGNOSIS — E896 Postprocedural adrenocortical (-medullary) hypofunction: Secondary | ICD-10-CM

## 2020-11-19 DIAGNOSIS — F900 Attention-deficit hyperactivity disorder, predominantly inattentive type: Secondary | ICD-10-CM | POA: Diagnosis not present

## 2020-11-19 DIAGNOSIS — F411 Generalized anxiety disorder: Secondary | ICD-10-CM

## 2020-11-19 DIAGNOSIS — R7303 Prediabetes: Secondary | ICD-10-CM

## 2020-11-19 DIAGNOSIS — U071 COVID-19: Secondary | ICD-10-CM | POA: Diagnosis not present

## 2020-11-19 DIAGNOSIS — I10 Essential (primary) hypertension: Secondary | ICD-10-CM

## 2020-11-19 NOTE — Progress Notes (Signed)
Name: Amanda Fields   MRN: [id number]    DOB: 06-Sep-1976   Date:11/19/2020       Progress Note  Subjective  Chief Complaint  Chief Complaint  Patient presents with  . Covid Exposure    Tested positive    I connected with  Jerrilyn Cairo on 11/19/20 at  8:00 AM EST by telephone and verified that I am speaking with the correct person using two identifiers.  I discussed the limitations, risks, security and privacy concerns of performing an evaluation and management service by telephone and the availability of in person appointments. The patient expressed understanding and agreed to proceed. Staff also discussed with the patient that there may be a patient responsible charge related to this service. Patient Location: Home Provider Location: [facility] Additional Individuals present: none  HPI Patient is a 44 year old female patient of Dr. Ancil Boozer Last visit with her was in September 2021, follow-up for major depression and anxiety, ADHD, obesity, and history of secondary hypertension, status post adrenalectomy in 2017 A follow-up as planned on 12/07/2020 Recently tested positive for Covid, Follows up with a phone visit.  Patient with Covid, test result was positive on 11/16/20, first two tests done at [facility], one rapid and PCR then on Monday and negative, the next PCR test at [facility] on Tuesday and was positive  Sx's started on 11/13/20  Notes symptoms that persist include: + cough, + production and sometimes clear, sometimes thick, has had blood tinged component at times, not coughing up blood Notes some SOB, + SOB at rest at times, notes he still tries to walk her dog some, and limited wearing 2 masks and with breathing Itchy throat and ears, nose burns + fever, highest 102 on Monday and Tuesday night and Wed and yesterday, feeling feverish with chills and sweats as well + loss of smell, loss of taste intermittently No N/V Minimal muscle aches No marked loose  stools/diarrhea, noted feels more constipated No CP, feels heaviness in chest, no passing out episodes Not able to sleep very well Taking tylenol prn, herbal teas, staying hydrated, flonase  Comorbid conditions reviewed  No h/o asthma/COPD No h/o DM,  + prediabetes  + History of adrenalectomy + HTN  + obesity  Tob -never smoker Work from home   Patient Active Problem List   Diagnosis Date Noted  . Chronic rhinitis 08/05/2020  . 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 Cervical dysplasia 11/24/2019  . Hypertension due to endocrine disorder 12/10/2018  . Pre-diabetes 09/27/2018  . Prolapsed internal hemorrhoids, grade 2 08/28/2018  . History of endometriosis 04/10/2018  . GAD (generalized anxiety disorder) 04/10/2018  . Mild recurrent major depression ([place]) 04/10/2018  . Obesity (BMI 30.0-34.9) 04/10/2018  . ADHD (attention deficit hyperactivity disorder), 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  . Family history of malignant neoplasm of gastrointestinal tract   . 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 Allergic rhinitis 02/04/2016  . Bilateral leg edema 04/09/2012    Past Surgical History:  Procedure Laterality Date  . COLONOSCOPY WITH PROPOFOL N/A 04/09/2017   Procedure: COLONOSCOPY WITH PROPOFOL;  Surgeon: Lucilla Lame, MD;  Location: [place];  Service: Endoscopy;  Laterality: N/A;  LATEX sensitivity  . IR GENERIC HISTORICAL  07/07/2016   IR VENOGRAM RENAL UNI LEFT 07/07/2016 Aletta Edouard, MD [facility]  . IR GENERIC HISTORICAL  07/07/2016   IR VENOUS SAMPLING 07/07/2016 Aletta Edouard, MD [facility]  . IR GENERIC HISTORICAL  07/07/2016   IR VENOGRAM ADRENAL BI 07/07/2016 Eulas Post  Kathlene Cote, MD [facility]  . IR GENERIC HISTORICAL  07/07/2016   IR US GUIDE VASC ACCESS RIGHT 07/07/2016 Aletta Edouard, MD [facility]  . IR GENERIC HISTORICAL  07/07/2016   IR ANGIOGRAM SELECTIVE EACH ADDITIONAL VESSEL 07/07/2016 Aletta Edouard, MD [facility]  . IR GENERIC HISTORICAL   07/07/2016   IR VENOUS SAMPLING 07/07/2016 Aletta Edouard, MD [facility]  . IR GENERIC HISTORICAL  07/07/2016   IR US GUIDE VASC ACCESS RIGHT 07/07/2016 Aletta Edouard, MD [facility]  . LAPAROSCOPIC SUPRACERVICAL HYSTERECTOMY  09/27/2010   For endometriosis. Ovaries and tubes are in place.  Marland Kitchen NASAL SEPTUM SURGERY    . ROBOTIC ADRENALECTOMY Left 10/27/2016   Procedure: XI ROBOTIC LEFT ADRENALECTOMY;  Surgeon: Michael Boston, MD;  Location: [facility];  Service: General;  Laterality: Left;  . TONSILLECTOMY    . TUBAL LIGATION  2006  . WISDOM TOOTH EXTRACTION      Family History  Problem Relation Age of Onset  . COPD Mother   . Hypotension Mother   . Colon cancer Father   . Emphysema Father   . Cancer Father        colon  . Hyperlipidemia Sister   . Heart failure Paternal Uncle   . Heart attack Cousin 32  . Heart disease Cousin        heart attack  . Breast cancer Maternal Aunt   . Adrenal disorder Neg Hx     Social History   Tobacco Use  . Smoking status: Never Smoker  . Smokeless tobacco: Never Used  Substance Use Topics  . Alcohol use: Yes    Comment: social (1 drink/mo)     Current Outpatient Medications:  .  ARIPiprazole (ABILIFY) 2 MG tablet, Take 1 tablet (2 mg total) by mouth every evening., Disp: 90 tablet, Rfl: 1 .  cloNIDine (CATAPRES) 0.1 MG tablet, Take 1 tablet (0.1 mg total) by mouth daily., Disp: 90 tablet, Rfl: 1 .  escitalopram (LEXAPRO) 20 MG tablet, Take 1 tablet (20 mg total) by mouth daily., Disp: 90 tablet, Rfl: 0 .  fluticasone (FLONASE) 50 MCG/ACT nasal spray, Place 2 sprays into both nostrils daily., Disp: 16 g, Rfl: 6 .  hydrochlorothiazide (HYDRODIURIL) 12.5 MG tablet, Take 1 tablet (12.5 mg total) by mouth daily., Disp: 90 tablet, Rfl: 0 .  levocetirizine (XYZAL) 5 MG tablet, Take 1 tablet (5 mg total) by mouth every evening., Disp: 90 tablet, Rfl: 1 .  methylphenidate 27 MG PO CR tablet, Take 1 tablet (27 mg total) by mouth every morning., Disp:  30 tablet, Rfl: 0 .  methylphenidate 27 MG PO CR tablet, Take 1 tablet (27 mg total) by mouth every morning., Disp: 30 tablet, Rfl: 0 .  methylphenidate 27 MG PO CR tablet, Take 1 tablet (27 mg total) by mouth daily., Disp: 30 tablet, Rfl: 0 .  metoprolol succinate (TOPROL-XL) 50 MG 24 hr tablet, TAKE 1 TABLET BY MOUTH DAILY WITH OR IMMEDIATELY FOLLOWING A MEAL, Disp: 90 tablet, Rfl: 0 .  nitrofurantoin, macrocrystal-monohydrate, (MACROBID) 100 MG capsule, Take 100 mg by mouth 2 (two) times daily., Disp: , Rfl:  .  phenazopyridine (PYRIDIUM) 200 MG tablet, TAKE 1 TABLET BY MOUTH THREE TIMES DAILY AFTER MEALS AS NEEDED FOR PAINFUL URINATION, Disp: , Rfl:  .  SUMAtriptan (IMITREX) 100 MG tablet, Take 1 tablet (100 mg total) by mouth every 2 (two) hours as needed for migraine. May repeat in 2 hours if headache persists or recurs., Disp: 10 tablet, Rfl: 0 .  topiramate (TOPAMAX) 100 MG tablet, Take 1 tablet (100 mg total) by mouth every evening., Disp: 90 tablet, Rfl: 1  Allergies  Allergen Reactions  . Ace Inhibitors Cough  . Latex Itching    Gloves, condoms  . Pollen Extract     With staff assistance, above reviewed with the patient today.  ROS: As per HPI, otherwise no specific complaints on a limited and focused system review   Objective  Virtual encounter, vitals not obtained.  There is no height or weight on file to calculate BMI.  Physical Exam   Appears in NAD via conversation, and occasional cough noted, sounds congested with a nasal voice Breathing: No obvious respiratory distress. Speaking in complete sentences Neurological: Pt is alert, Speech is normal Psychiatric: Patient has a normal mood and affect,  Judgment and thought content normal.   No results found for this or any previous visit (from the past 72 hour(s)).  PHQ2/9: Depression screen [facility] 2/9 11/19/2020 09/07/2020 08/05/2020 06/15/2020 02/06/2020  Decreased Interest 1 1 2 1  0  Down, Depressed, Hopeless 1 1 0 2 0   PHQ - 2 Score 2 2 2 3  0  Altered sleeping 3 3 3 3 3   Tired, decreased energy 0 0 3 2 3   Change in appetite 3 0 3 2 1   Feeling bad or failure about yourself  0 0 2 2 0  Trouble concentrating 3 0 2 1 3   Moving slowly or fidgety/restless 3 0 2 0 0  Suicidal thoughts 1 0 0 1 0  PHQ-9 Score 15 5 17 14 10   Difficult doing work/chores Not difficult at all Somewhat difficult Somewhat difficult Somewhat difficult Somewhat difficult  Some recent data might be hidden   PHQ-2/9 Result reviewed  Fall Risk: Fall Risk  11/19/2020 09/07/2020 08/05/2020 06/15/2020 02/06/2020  Falls in the past year? 0 0 0 0 0  Number falls in past yr: 0 0 0 0 0  Injury with Fall? 0 0 0 0 0  Follow up - - - - -     Assessment & Plan 1. COVID-19  Educated on Covid and management recommendations, monoclonal antibody treatments entertained for non-hospitalized patients at risk for severe disease within 10 days of symptom onset, and do think they would be indicated in her case.  She also was open to this treatment, and a referral was placed with Crystal asked to call and help place that referral in hopes of getting that done as soon as today.  I did note that the decision is ultimately made by those at the clinic where the infusions are given, and based on supplies and triaging of patients in need. Emphasized the importance of continuing symptomatic measures as well. stay well hydrated, rest, would add a mucinex or robitussin type OTC product as needed for cough and tylenol prn for fevers and achiness. Can also add an antihistamine, and that may be helpful with her sleep at night, with an OTC Benadryl type product, which is 25 mg up to 3-4 times a day as needed, and can take 2 tablets (50 mg) at bedtime this may help with sleep. Discussed possibly adding a steroid, a little hesitant to do so presently noting the risk/benefits with this, although do feel may be added in the very near future pending her status and the evaluation  for the antibody infusion.  Remain isolated presently until recovered and removal from isolation discussed- recommended ok to if at least 10 days since first symptoms, no fever at  least 24-48 hours since recovery and other sx's have much improved   Emphasized if sx's worsening acutely, such as increased SOB, high fevers, increased weakness or passing out feelings, chest pains developing, or other concerning sx's arise, should f/u more emergently in the ER for more immediate evaluation.  She was understanding of this.  2. Major depression, recurrent, chronic ([place]) 3. Generalized anxiety disorder 4. ADHD (attention deficit hyperactivity disorder), inattentive type PHQ results noted, followed by Dr. Ancil Boozer for management presently  5. Essential hypertension 6. Pre-diabetes 7. 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 Obesity with serious comorbidity, unspecified classification, unspecified obesity type Noted her comorbidities, and do feel she is a candidate for the monoclonal antibody infusion treatment and that referral was provided this morning.  Await her response to the above, and asked Crystal to follow-up with a phone call to her Monday to assess her status, with patient understanding of symptoms becoming more problematic with or without receiving the antibody infusion, the importance of following up more emergently in an ER setting.  I discussed the assessment and treatment plan with the patient. The patient was provided an opportunity to ask questions and all were answered. The patient agreed with the plan and demonstrated an understanding of the instructions.   The patient was advised to call back or seek an in-person evaluation if the symptoms worsen or if the condition fails to improve as anticipated.  I provided 20 minutes of non-face-to-face time during this encounter that included discussing at length patient's sx/history, pertinent pmhx, medications, treatment and follow up plan. This  time also included the necessary documentation, orders, and chart review.  Towanda Malkin, MD

## 2020-11-21 ENCOUNTER — Telehealth (HOSPITAL_COMMUNITY): Payer: Self-pay | Admitting: Nurse Practitioner

## 2020-11-21 DIAGNOSIS — U071 COVID-19: Secondary | ICD-10-CM

## 2020-11-21 NOTE — Telephone Encounter (Signed)
Called to discuss with Amanda Fields about Covid symptoms and the use of a monoclonal antibody infusion for those with mild to moderate Covid symptoms and at a high risk of hospitalization.     Pt is qualified for this infusion at the [place] infusion center due to co-morbid conditions and/or a member of an at-risk group, however currently there are no appointments available within the 10 day window due to recent medication shortage. I provided patient with [place] number so other clinics may be called to check for availability.     Symptoms tier reviewed as well as criteria for ending isolation.  Symptoms reviewed that would warrant ED/Hospital evaluation. Preventative practices reviewed. Patient verbalized understanding. Patient advised to call back if he/she opts to proceed with infusion. Callback number provided. Urgent care and/or ER precautions given for severe symptoms.   Problem List: Bmi, htn  Beckey Rutter, NP [phone] [email or web address]

## 2020-11-22 ENCOUNTER — Telehealth: Payer: Self-pay

## 2020-11-22 NOTE — Telephone Encounter (Signed)
Called patient she did not have antibody treatment. She said they were out and they gave her another place to call and get it, but she did not have it done. She said she is feeling better. Her taste has returned and her chest tightness has resolved and her cough is better.

## 2020-11-24 ENCOUNTER — Encounter: Payer: Self-pay | Admitting: Advanced Practice Midwife

## 2020-11-24 ENCOUNTER — Ambulatory Visit (INDEPENDENT_AMBULATORY_CARE_PROVIDER_SITE_OTHER): Payer: Medicaid Other | Admitting: Advanced Practice Midwife

## 2020-11-24 ENCOUNTER — Other Ambulatory Visit (HOSPITAL_COMMUNITY)
Admission: RE | Admit: 2020-11-24 | Discharge: 2020-11-24 | Disposition: A | Discharge status: Home / Self Care | Payer: Medicaid Other | Source: Ambulatory Visit | Attending: Advanced Practice Midwife | Admitting: Advanced Practice Midwife

## 2020-11-24 ENCOUNTER — Other Ambulatory Visit: Payer: Self-pay | Admitting: Advanced Practice Midwife

## 2020-11-24 ENCOUNTER — Other Ambulatory Visit: Payer: Self-pay

## 2020-11-24 VITALS — BP 143/95 | HR 82 | Ht 66.0 in | Wt 222.4 lb

## 2020-11-24 DIAGNOSIS — Z0000 Encounter for general adult medical examination without abnormal findings: Secondary | ICD-10-CM | POA: Diagnosis not present

## 2020-11-24 DIAGNOSIS — Z113 Encounter for screening for infections with a predominantly sexual mode of transmission: Secondary | ICD-10-CM | POA: Diagnosis not present

## 2020-11-24 DIAGNOSIS — Z1231 Encounter for screening mammogram for malignant neoplasm of breast: Secondary | ICD-10-CM

## 2020-11-24 DIAGNOSIS — N879 Dysplasia of cervix uteri, unspecified: Secondary | ICD-10-CM

## 2020-11-24 DIAGNOSIS — Z8742 Personal history of other diseases of the female genital tract: Secondary | ICD-10-CM | POA: Diagnosis not present

## 2020-11-24 NOTE — Progress Notes (Signed)
GYNECOLOGY ANNUAL PREVENTATIVE CARE ENCOUNTER NOTE  History:     Amanda Fields is a 44 y.o. G74P2002 female here for a routine annual gynecologic exam.  Current complaints: none.   Denies abnormal vaginal bleeding, discharge, pelvic pain, problems with intercourse or other gynecologic concerns.    Patient requests STI screening. New female partner, condom use for first encounter then condom broke. Unsure of partner testing status.    Gynecologic History No LMP recorded. Patient has had a hysterectomy. Contraception: status post hysterectomy Last Pap: 10/2019. Results were: abnormal with positive HPV Last mammogram: 01/2020. Results were: normal  Obstetric History OB History  Gravida Para Term Preterm AB Living  2 2 2     2   SAB TAB Ectopic Multiple Live Births          2    # Outcome Date GA Lbr Len/2nd Weight Sex Delivery Anes PTL Lv  2 Term 12/18/04    M Vag-Spont   LIV  1 Term 10/30/95    F Vag-Spont   LIV    Past Medical History:  Diagnosis Date  . ADHD (attention deficit hyperactivity disorder), 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  . Adrenal mass, left ([place])   . Anemia    none since Hysterectomy done-related to heavy menses  . Asthma    Related to allergies"wheezes with pollen exposure" no asthma attacks  . Breast lipoma 09/06/2011   resolved- no surgery-tx. medically.  . Cough due to ACE inhibitor   . Dizziness   . Edema leg   . Elevated troponin I level 12/31/2015  . Endometriosis   . Hypertension    a. echo 03/2012 EF 60-65%, moderate LVH, nl LV diastolic fxn, nl PASP; b. echo 12/2015: EF 55-60%  . Hypertensive urgency 12/31/2015  . Migraine headache    migrianes-less frequent now  . Obesity   . Palpitation     Past Surgical History:  Procedure Laterality Date  . COLONOSCOPY WITH PROPOFOL N/A 04/09/2017   Procedure: COLONOSCOPY WITH PROPOFOL;  Surgeon: Lucilla Lame, MD;  Location: [place];  Service: Endoscopy;  Laterality: N/A;  LATEX sensitivity  . IR  GENERIC HISTORICAL  07/07/2016   IR VENOGRAM RENAL UNI LEFT 07/07/2016 Aletta Edouard, MD [facility]  . IR GENERIC HISTORICAL  07/07/2016   IR VENOUS SAMPLING 07/07/2016 Aletta Edouard, MD [facility]  . IR GENERIC HISTORICAL  07/07/2016   IR VENOGRAM ADRENAL BI 07/07/2016 Aletta Edouard, MD [facility]  . IR GENERIC HISTORICAL  07/07/2016   IR US GUIDE VASC ACCESS RIGHT 07/07/2016 Aletta Edouard, MD [facility]  . IR GENERIC HISTORICAL  07/07/2016   IR ANGIOGRAM SELECTIVE EACH ADDITIONAL VESSEL 07/07/2016 Aletta Edouard, MD [facility]  . IR GENERIC HISTORICAL  07/07/2016   IR VENOUS SAMPLING 07/07/2016 Aletta Edouard, MD [facility]  . IR GENERIC HISTORICAL  07/07/2016   IR US GUIDE VASC ACCESS RIGHT 07/07/2016 Aletta Edouard, MD [facility]  . LAPAROSCOPIC SUPRACERVICAL HYSTERECTOMY  09/27/2010   For endometriosis. Ovaries and tubes are in place.  Marland Kitchen NASAL SEPTUM SURGERY    . ROBOTIC ADRENALECTOMY Left 10/27/2016   Procedure: XI ROBOTIC LEFT ADRENALECTOMY;  Surgeon: Michael Boston, MD;  Location: [facility];  Service: General;  Laterality: Left;  . TONSILLECTOMY    . TUBAL LIGATION  2006  . WISDOM TOOTH EXTRACTION      Current Outpatient Medications on File Prior to Visit  Medication Sig Dispense Refill  . ARIPiprazole (ABILIFY) 2 MG tablet Take 1 tablet (2  mg total) by mouth every evening. 90 tablet 1  . cloNIDine (CATAPRES) 0.1 MG tablet Take 1 tablet (0.1 mg total) by mouth daily. 90 tablet 1  . escitalopram (LEXAPRO) 20 MG tablet Take 1 tablet (20 mg total) by mouth daily. 90 tablet 0  . fluticasone (FLONASE) 50 MCG/ACT nasal spray Place 2 sprays into both nostrils daily. 16 g 6  . hydrochlorothiazide (HYDRODIURIL) 12.5 MG tablet Take 1 tablet (12.5 mg total) by mouth daily. 90 tablet 0  . levocetirizine (XYZAL) 5 MG tablet Take 1 tablet (5 mg total) by mouth every evening. 90 tablet 1  . methylphenidate 27 MG PO CR tablet Take 1 tablet (27 mg total) by mouth every morning. 30 tablet  0  . metoprolol succinate (TOPROL-XL) 50 MG 24 hr tablet TAKE 1 TABLET BY MOUTH DAILY WITH OR IMMEDIATELY FOLLOWING A MEAL 90 tablet 0  . SUMAtriptan (IMITREX) 100 MG tablet Take 1 tablet (100 mg total) by mouth every 2 (two) hours as needed for migraine. May repeat in 2 hours if headache persists or recurs. 10 tablet 0  . topiramate (TOPAMAX) 100 MG tablet Take 1 tablet (100 mg total) by mouth every evening. 90 tablet 1  . methylphenidate 27 MG PO CR tablet Take 1 tablet (27 mg total) by mouth every morning. (Patient not taking: Reported on 11/24/2020) 30 tablet 0  . methylphenidate 27 MG PO CR tablet Take 1 tablet (27 mg total) by mouth daily. (Patient not taking: Reported on 11/24/2020) 30 tablet 0  . nitrofurantoin, macrocrystal-monohydrate, (MACROBID) 100 MG capsule Take 100 mg by mouth 2 (two) times daily. (Patient not taking: Reported on 11/24/2020)    . phenazopyridine (PYRIDIUM) 200 MG tablet TAKE 1 TABLET BY MOUTH THREE TIMES DAILY AFTER MEALS AS NEEDED FOR PAINFUL URINATION (Patient not taking: Reported on 11/24/2020)    . [DISCONTINUED] lisinopril-hydrochlorothiazide (PRINZIDE) 20-12.5 MG per tablet Take 1 tablet by mouth daily. 30 tablet 6  . [DISCONTINUED] torsemide (DEMADEX) 10 MG tablet Take 10 mg by mouth daily.       No current facility-administered medications on file prior to visit.    Allergies  Allergen Reactions  . Ace Inhibitors Cough  . Latex Itching    Gloves, condoms  . Pollen Extract     Social History:  reports that she has never smoked. She has never used smokeless tobacco. She reports current alcohol use. She reports that she does not use drugs.  Family History  Problem Relation Age of Onset  . COPD Mother   . Hypotension Mother   . Colon cancer Father   . Emphysema Father   . Cancer Father        colon  . Hyperlipidemia Sister   . Heart failure Paternal Uncle   . Heart attack Cousin 32  . Heart disease Cousin        heart attack  . Breast cancer  Maternal Aunt   . Adrenal disorder Neg Hx     The following portions of the patient's history were reviewed and updated as appropriate: allergies, current medications, past family history, past medical history, past social history, past surgical history and problem list.  Review of Systems Pertinent items noted in HPI and remainder of comprehensive ROS otherwise negative.  Physical Exam:  BP (!) 143/95   Pulse 82   Ht 5' 6"  (1.676 m)   Wt 222 lb 6.4 oz (100.9 kg)   BMI 35.90 kg/m  CONSTITUTIONAL: Well-developed, well-nourished female in no acute distress.  HENT:  Normocephalic, atraumatic, External right and left ear normal.  EYES: Conjunctivae and EOM are normal. Pupils are equal, round, and reactive to light. No scleral icterus.  NECK: Normal range of motion, supple, no masses. SKIN: Skin is warm and dry. No rash noted. Not diaphoretic. No erythema. No pallor. MUSCULOSKELETAL: Normal range of motion. No tenderness.  No cyanosis, clubbing, or edema.   NEUROLOGIC: Alert and oriented to person, place, and time. Normal reflexes, muscle tone coordination.  PSYCHIATRIC: Normal mood and affect. Normal behavior. Normal judgment and thought content. CARDIOVASCULAR: Normal heart rate noted, regular rhythm RESPIRATORY: Clear to auscultation bilaterally. Effort and breath sounds normal, no problems with respiration noted. BREASTS: Symmetric in size. No masses, tenderness, skin changes, nipple drainage, or lymphadenopathy bilaterally. Performed in the presence of a chaperone. ABDOMEN: Soft, no distention noted.  No tenderness, rebound or guarding.  PELVIC: Normal appearing external genitalia and urethral meatus. Cervicovaginal swab collected via blind swab   Assessment and Plan:    1. Well woman exam without gynecological exam - No concerning findings on physical exam  2. Routine screening for STI (sexually transmitted infection)  - Cervicovaginal ancillary only - Hepatitis B surface  antigen - Hepatitis C antibody - RPR - HIV Antibody (routine testing w rflx)  3. Hx of abnormal cervical Pap smear - 10/2019: LSIL w/ + HRHPV, negative 16, negative 18/45 - 10/2018: ASCUS w/ negative HPV - 10/2017: ASCUS w/ negative HPV - [facility]P advised Colposcopy, Confirmed with Dr. Harolyn Rutherford   Routine preventative health maintenance measures emphasized. Please refer to After Visit Summary for other counseling recommendations.      Mallie Snooks, MSN, CNM Certified Nurse Midwife, [employer] for [employer], [place] [facility] 11/24/20 9:00 AM

## 2020-11-24 NOTE — Patient Instructions (Signed)

## 2020-11-25 ENCOUNTER — Telehealth: Payer: Self-pay

## 2020-11-25 LAB — CERVICOVAGINAL ANCILLARY ONLY
Bacterial Vaginitis (gardnerella): POSITIVE — AB
Candida Glabrata: NEGATIVE
Candida Vaginitis: NEGATIVE
Chlamydia: NEGATIVE
Comment: NEGATIVE
Comment: NEGATIVE
Comment: NEGATIVE
Comment: NEGATIVE
Comment: NEGATIVE
Comment: NORMAL
Neisseria Gonorrhea: NEGATIVE
Trichomonas: NEGATIVE

## 2020-11-25 LAB — RPR: RPR Ser Ql: NONREACTIVE

## 2020-11-25 LAB — HIV ANTIBODY (ROUTINE TESTING W REFLEX): HIV Screen 4th Generation wRfx: NONREACTIVE

## 2020-11-25 LAB — HEPATITIS B SURFACE ANTIGEN: Hepatitis B Surface Ag: NEGATIVE

## 2020-11-25 LAB — HEPATITIS C ANTIBODY: Hep C Virus Ab: <0.1 s/co ratio (ref 0.0–0.9)

## 2020-11-25 NOTE — Telephone Encounter (Signed)
Pt called and informed of results. Pt verbalizes understanding

## 2020-11-26 ENCOUNTER — Other Ambulatory Visit: Payer: Self-pay | Admitting: *Deleted

## 2020-11-26 MED ORDER — METRONIDAZOLE 500 MG PO TABS: 500.0000 mg | ORAL_TABLET | Freq: Two times a day (BID) | ORAL | 0 refills | Status: DC

## 2020-12-06 ENCOUNTER — Other Ambulatory Visit: Payer: Self-pay | Admitting: Family Medicine

## 2020-12-06 DIAGNOSIS — I10 Essential (primary) hypertension: Secondary | ICD-10-CM

## 2020-12-06 DIAGNOSIS — R002 Palpitations: Secondary | ICD-10-CM
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
## 2020-12-07 ENCOUNTER — Other Ambulatory Visit: Payer: Self-pay

## 2020-12-07 ENCOUNTER — Ambulatory Visit: Payer: Medicaid Other | Admitting: Family Medicine

## 2020-12-07 ENCOUNTER — Encounter: Payer: Self-pay | Admitting: Family Medicine

## 2020-12-07 VITALS — BP 138/86 | HR 76 | Temp 98.1°F | Resp 16 | Ht 66.0 in | Wt 222.0 lb

## 2020-12-07 DIAGNOSIS — I10 Essential (primary) hypertension: Secondary | ICD-10-CM | POA: Diagnosis not present

## 2020-12-07 DIAGNOSIS — Z23 Encounter for immunization: Secondary | ICD-10-CM | POA: Diagnosis not present

## 2020-12-07 DIAGNOSIS — F411 Generalized anxiety disorder: Secondary | ICD-10-CM | POA: Diagnosis not present

## 2020-12-07 DIAGNOSIS — R0981 Nasal congestion: Secondary | ICD-10-CM

## 2020-12-07 DIAGNOSIS — N951 Menopausal and female climacteric states: Secondary | ICD-10-CM | POA: Diagnosis not present

## 2020-12-07 DIAGNOSIS — F339 Major depressive disorder, recurrent, unspecified: Secondary | ICD-10-CM | POA: Diagnosis not present

## 2020-12-07 DIAGNOSIS — G43001 Migraine without aura, not intractable, with status migrainosus: Secondary | ICD-10-CM | POA: Diagnosis not present

## 2020-12-07 DIAGNOSIS — F5104 Psychophysiologic insomnia: Secondary | ICD-10-CM

## 2020-12-07 DIAGNOSIS — R002 Palpitations: Secondary | ICD-10-CM

## 2020-12-07 DIAGNOSIS — J0141 Acute recurrent pansinusitis: Secondary | ICD-10-CM | POA: Diagnosis not present

## 2020-12-07 DIAGNOSIS — F900 Attention-deficit hyperactivity disorder, predominantly inattentive type: Secondary | ICD-10-CM | POA: Diagnosis not present

## 2020-12-07 MED ORDER — TOPIRAMATE 100 MG PO TABS: 100.0000 mg | ORAL_TABLET | Freq: Every evening | ORAL | 1 refills | Status: DC

## 2020-12-07 MED ORDER — METOPROLOL SUCCINATE ER 50 MG PO TB24: ORAL_TABLET | ORAL | 1 refills | Status: DC

## 2020-12-07 MED ORDER — METHYLPHENIDATE HCL ER (OSM) 27 MG PO TBCR: 27.0000 mg | EXTENDED_RELEASE_TABLET | Freq: Every day | ORAL | 0 refills | Status: DC

## 2020-12-07 MED ORDER — LOSARTAN POTASSIUM-HCTZ 50-12.5 MG PO TABS: 1.0000 | ORAL_TABLET | Freq: Every day | ORAL | 0 refills | Status: DC

## 2020-12-07 MED ORDER — FLUTICASONE PROPIONATE 50 MCG/ACT NA SUSP: 2.0000 | Freq: Every day | NASAL | 1 refills | Status: DC

## 2020-12-07 MED ORDER — AZELASTINE HCL 0.1 % NA SOLN: 2.0000 | Freq: Every day | NASAL | 1 refills | Status: DC

## 2020-12-07 MED ORDER — METHYLPHENIDATE HCL ER (OSM) 27 MG PO TBCR: 27.0000 mg | EXTENDED_RELEASE_TABLET | ORAL | 0 refills | Status: DC

## 2020-12-07 MED ORDER — ARIPIPRAZOLE 2 MG PO TABS: 2.0000 mg | ORAL_TABLET | Freq: Every evening | ORAL | 1 refills | Status: DC

## 2020-12-07 MED ORDER — ESCITALOPRAM OXALATE 20 MG PO TABS: 20.0000 mg | ORAL_TABLET | Freq: Every day | ORAL | 1 refills | Status: DC

## 2020-12-07 MED ORDER — CLONIDINE HCL 0.1 MG PO TABS: 0.1000 mg | ORAL_TABLET | Freq: Every day | ORAL | 1 refills | Status: DC

## 2020-12-09 ENCOUNTER — Encounter: Payer: Self-pay | Admitting: Obstetrics & Gynecology

## 2020-12-09 ENCOUNTER — Other Ambulatory Visit: Payer: Self-pay | Admitting: Obstetrics & Gynecology

## 2020-12-09 ENCOUNTER — Encounter: Payer: Medicaid Other | Admitting: Obstetrics & Gynecology

## 2020-12-09 ENCOUNTER — Other Ambulatory Visit (HOSPITAL_COMMUNITY)
Admission: RE | Admit: 2020-12-09 | Discharge: 2020-12-09 | Disposition: A | Discharge status: Home / Self Care | Payer: Medicaid Other | Source: Ambulatory Visit | Attending: Obstetrics & Gynecology | Admitting: Obstetrics & Gynecology

## 2020-12-09 ENCOUNTER — Other Ambulatory Visit: Payer: Self-pay

## 2020-12-09 ENCOUNTER — Ambulatory Visit (INDEPENDENT_AMBULATORY_CARE_PROVIDER_SITE_OTHER): Payer: Medicaid Other | Admitting: Obstetrics & Gynecology

## 2020-12-09 VITALS — BP 135/91 | HR 85

## 2020-12-09 DIAGNOSIS — R87612 Low grade squamous intraepithelial lesion on cytologic smear of cervix (LGSIL): Secondary | ICD-10-CM | POA: Diagnosis not present

## 2020-12-09 NOTE — Patient Instructions (Signed)
COLPOSCOPY POST-PROCEDURE INSTRUCTIONS  1. You may take Ibuprofen, Aleve or Tylenol for cramping if needed.  2. If Monsel's solution was used, you will have a black discharge.  3. Light bleeding is normal.  If bleeding is heavier than your period, please call.  4. Put nothing in your vagina until the bleeding or discharge stops (usually 2 or 3 days).  5. We will call you within one week with biopsy results or discuss the results at your follow-up appointment if needed.

## 2020-12-09 NOTE — Progress Notes (Signed)
    GYNECOLOGY OFFICE COLPOSCOPY PROCEDURE NOTE  44 y.o. 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 Discussed role for HPV in cervical dysplasia, need for surveillance.  Patient gave informed written consent, time out was performed.  Placed in lithotomy position. Cervix viewed with speculum, repeat pap smear done.  Cervix viewed with colposcope after application of acetic acid.   Colposcopy adequate? Yes No visible lesions; no biopsies obtained.  ECC specimen obtained, labeled and sent to pathology.  Patient was given post procedure instructions.  Will follow up pathology and manage accordingly; patient will be contacted with results and recommendations.  Routine preventative health maintenance measures emphasized.    Verita Schneiders, MD, [place] for [employer], [place]

## 2020-12-15 ENCOUNTER — Encounter: Payer: Self-pay | Admitting: Obstetrics & Gynecology

## 2020-12-15 ENCOUNTER — Telehealth: Payer: Self-pay | Admitting: *Deleted

## 2020-12-15 DIAGNOSIS — N870 Mild cervical dysplasia: Secondary | ICD-10-CM | POA: Insufficient documentation

## 2020-12-15 NOTE — Telephone Encounter (Signed)
Pt informed of colpo results and recommendations to repeat pap in one year.

## 2020-12-15 NOTE — Telephone Encounter (Signed)
-----   Message from Osborne Oman, MD sent at 12/15/2020  2:07 AM EST ----- 12/09/20 Colposcopy Pathology Diagnosis after LGSIL +HPV pap Endocervix, curettage - LOW GRADE SQUAMOUS INTRAEPITHELIAL LESION (CIN-I) IN FRAGMENTS OF SQUAMOUS MUCOSA. - FRAGMENTS OF BENIGN ENDOCERVICAL MUCOSA.  Needs to repeat co-testing in one year.  Please call to inform patient of results and recommendations.

## 2020-12-19 LAB — CYTOLOGY - PAP
Comment: NEGATIVE
Comment: NEGATIVE
Diagnosis: NEGATIVE
Diagnosis: REACTIVE
HPV 16: NEGATIVE
HPV 18 / 45: NEGATIVE
High risk HPV: POSITIVE — AB

## 2020-12-20 ENCOUNTER — Encounter: Payer: Self-pay | Admitting: Family Medicine

## 2021-01-05 ENCOUNTER — Other Ambulatory Visit: Payer: Self-pay | Admitting: Family Medicine

## 2021-01-05 DIAGNOSIS — I10 Essential (primary) hypertension: Secondary | ICD-10-CM

## 2021-02-07 ENCOUNTER — Inpatient Hospital Stay: Admission: RE | Admit: 2021-02-07 | Payer: Medicaid Other | Source: Ambulatory Visit

## 2021-02-12 ENCOUNTER — Ambulatory Visit
Admission: RE | Admit: 2021-02-12 | Discharge: 2021-02-12 | Disposition: A | Discharge status: Home / Self Care | Payer: Medicaid Other | Source: Ambulatory Visit | Attending: Advanced Practice Midwife | Admitting: Advanced Practice Midwife

## 2021-02-12 ENCOUNTER — Other Ambulatory Visit: Payer: Self-pay

## 2021-02-12 DIAGNOSIS — Z1231 Encounter for screening mammogram for malignant neoplasm of breast: Secondary | ICD-10-CM | POA: Diagnosis not present
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
## 2021-03-15 ENCOUNTER — Ambulatory Visit: Payer: PRIVATE HEALTH INSURANCE | Admitting: Family Medicine
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
## 2021-03-22 ENCOUNTER — Ambulatory Visit: Payer: PRIVATE HEALTH INSURANCE | Admitting: Family Medicine

## 2021-03-22 ENCOUNTER — Encounter: Payer: Self-pay | Admitting: Family Medicine

## 2021-03-22 ENCOUNTER — Other Ambulatory Visit: Payer: Self-pay

## 2021-03-22 VITALS — BP 132/88 | HR 82 | Temp 98.8°F | Resp 16 | Ht 66.0 in | Wt 222.0 lb

## 2021-03-22 DIAGNOSIS — F900 Attention-deficit hyperactivity disorder, predominantly inattentive type: Secondary | ICD-10-CM | POA: Diagnosis not present

## 2021-03-22 DIAGNOSIS — R7303 Prediabetes: Secondary | ICD-10-CM | POA: Diagnosis not present

## 2021-03-22 DIAGNOSIS — R739 Hyperglycemia, unspecified: Secondary | ICD-10-CM

## 2021-03-22 DIAGNOSIS — F411 Generalized anxiety disorder: Secondary | ICD-10-CM | POA: Diagnosis not present

## 2021-03-22 DIAGNOSIS — I10 Essential (primary) hypertension: Secondary | ICD-10-CM

## 2021-03-22 DIAGNOSIS — F5104 Psychophysiologic insomnia: Secondary | ICD-10-CM

## 2021-03-22 DIAGNOSIS — E896 Postprocedural adrenocortical (-medullary) hypofunction: Secondary | ICD-10-CM

## 2021-03-22 DIAGNOSIS — N951 Menopausal and female climacteric states: Secondary | ICD-10-CM | POA: Diagnosis not present

## 2021-03-22 DIAGNOSIS — G43001 Migraine without aura, not intractable, with status migrainosus: Secondary | ICD-10-CM

## 2021-03-22 DIAGNOSIS — F339 Major depressive disorder, recurrent, unspecified: Secondary | ICD-10-CM | POA: Diagnosis not present

## 2021-03-22 MED ORDER — METHYLPHENIDATE HCL ER (OSM) 27 MG PO TBCR: 27.0000 mg | EXTENDED_RELEASE_TABLET | Freq: Every day | ORAL | 0 refills | Status: DC

## 2021-03-22 MED ORDER — SPIRONOLACTONE-HCTZ 25-25 MG PO TABS: 1.0000 | ORAL_TABLET | Freq: Every day | ORAL | 0 refills | Status: DC

## 2021-03-22 MED ORDER — LOSARTAN POTASSIUM-HCTZ 100-12.5 MG PO TABS: 1.0000 | ORAL_TABLET | Freq: Every day | ORAL | 0 refills | Status: DC

## 2021-03-22 MED ORDER — METHYLPHENIDATE HCL ER (OSM) 27 MG PO TBCR: 27.0000 mg | EXTENDED_RELEASE_TABLET | ORAL | 0 refills | Status: DC

## 2021-03-22 MED ORDER — ESCITALOPRAM OXALATE 10 MG PO TABS: 10.0000 mg | ORAL_TABLET | Freq: Every day | ORAL | 0 refills | Status: DC

## 2021-03-22 MED ORDER — ARIPIPRAZOLE 2 MG PO TABS
2.0000 mg | ORAL_TABLET | Freq: Every evening | ORAL | 0 refills | Status: DC
Start: 2021-03-22 — End: 2021-06-14

## 2021-03-23 ENCOUNTER — Encounter: Payer: Self-pay | Admitting: Family Medicine

## 2021-06-13 DIAGNOSIS — L700 Acne vulgaris: Secondary | ICD-10-CM | POA: Insufficient documentation

## 2021-06-13 DIAGNOSIS — L918 Other hypertrophic disorders of the skin: Secondary | ICD-10-CM | POA: Insufficient documentation
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
## 2021-06-14 ENCOUNTER — Other Ambulatory Visit: Payer: Self-pay

## 2021-06-14 ENCOUNTER — Encounter: Payer: Self-pay | Admitting: Family Medicine

## 2021-06-14 ENCOUNTER — Ambulatory Visit: Payer: PRIVATE HEALTH INSURANCE | Admitting: Family Medicine

## 2021-06-14 VITALS — BP 122/80 | HR 78 | Temp 98.2°F | Resp 16 | Ht 66.0 in | Wt 212.0 lb

## 2021-06-14 DIAGNOSIS — R739 Hyperglycemia, unspecified: Secondary | ICD-10-CM

## 2021-06-14 DIAGNOSIS — G43001 Migraine without aura, not intractable, with status migrainosus: Secondary | ICD-10-CM

## 2021-06-14 DIAGNOSIS — F900 Attention-deficit hyperactivity disorder, predominantly inattentive type: Secondary | ICD-10-CM | POA: Diagnosis not present

## 2021-06-14 DIAGNOSIS — F339 Major depressive disorder, recurrent, unspecified: Secondary | ICD-10-CM | POA: Diagnosis not present

## 2021-06-14 DIAGNOSIS — R7303 Prediabetes: Secondary | ICD-10-CM | POA: Diagnosis not present

## 2021-06-14 DIAGNOSIS — E896 Postprocedural adrenocortical (-medullary) hypofunction: Secondary | ICD-10-CM | POA: Diagnosis not present

## 2021-06-14 DIAGNOSIS — G4700 Insomnia, unspecified: Secondary | ICD-10-CM | POA: Diagnosis not present

## 2021-06-14 DIAGNOSIS — F411 Generalized anxiety disorder: Secondary | ICD-10-CM | POA: Diagnosis not present

## 2021-06-14 DIAGNOSIS — E66811 Obesity, class 1: Secondary | ICD-10-CM

## 2021-06-14 DIAGNOSIS — R002 Palpitations: Secondary | ICD-10-CM

## 2021-06-14 DIAGNOSIS — R634 Abnormal weight loss: Secondary | ICD-10-CM

## 2021-06-14 DIAGNOSIS — E785 Hyperlipidemia, unspecified: Secondary | ICD-10-CM

## 2021-06-14 DIAGNOSIS — E559 Vitamin D deficiency, unspecified: Secondary | ICD-10-CM

## 2021-06-14 DIAGNOSIS — I10 Essential (primary) hypertension: Secondary | ICD-10-CM | POA: Diagnosis not present

## 2021-06-14 DIAGNOSIS — E669 Obesity, unspecified: Secondary | ICD-10-CM | POA: Diagnosis not present

## 2021-06-14 MED ORDER — METHYLPHENIDATE HCL ER (OSM) 27 MG PO TBCR: 27.0000 mg | EXTENDED_RELEASE_TABLET | Freq: Every day | ORAL | 0 refills | Status: DC

## 2021-06-14 MED ORDER — TOPIRAMATE 100 MG PO TABS: 100.0000 mg | ORAL_TABLET | Freq: Every evening | ORAL | 1 refills | Status: DC

## 2021-06-14 MED ORDER — SPIRONOLACTONE-HCTZ 25-25 MG PO TABS: 1.0000 | ORAL_TABLET | Freq: Every day | ORAL | 1 refills | Status: DC

## 2021-06-14 MED ORDER — QUVIVIQ 50 MG PO TABS: 1.0000 | ORAL_TABLET | Freq: Every day | ORAL | 0 refills | Status: DC

## 2021-06-14 MED ORDER — ARIPIPRAZOLE 2 MG PO TABS: 2.0000 mg | ORAL_TABLET | Freq: Every evening | ORAL | 1 refills | Status: DC

## 2021-06-14 MED ORDER — METHYLPHENIDATE HCL ER (OSM) 27 MG PO TBCR: 27.0000 mg | EXTENDED_RELEASE_TABLET | ORAL | 0 refills | Status: DC

## 2021-06-14 MED ORDER — METOPROLOL SUCCINATE ER 50 MG PO TB24: ORAL_TABLET | ORAL | 1 refills | Status: DC

## 2021-06-14 MED ORDER — ESCITALOPRAM OXALATE 10 MG PO TABS: 10.0000 mg | ORAL_TABLET | Freq: Every day | ORAL | 1 refills | Status: DC

## 2021-06-15 DIAGNOSIS — Z03818 Encounter for observation for suspected exposure to other biological agents ruled out: Secondary | ICD-10-CM | POA: Diagnosis not present

## 2021-06-15 DIAGNOSIS — Z20822 Contact with and (suspected) exposure to covid-19: Secondary | ICD-10-CM | POA: Diagnosis not present

## 2021-06-15 LAB — COMPLETE METABOLIC PANEL WITH GFR
AG Ratio: 1.4 (calc) (ref 1.0–2.5)
ALT: 17 U/L (ref 6–29)
AST: 24 U/L (ref 10–35)
Albumin: 4.3 g/dL (ref 3.6–5.1)
Alkaline phosphatase (APISO): 47 U/L (ref 31–125)
BUN: 11 mg/dL (ref 7–25)
CO2: 29 mmol/L (ref 20–32)
Calcium: 9.9 mg/dL (ref 8.6–10.2)
Chloride: 105 mmol/L (ref 98–110)
Creat: 1.08 mg/dL (ref 0.50–1.10)
GFR, Est African American: 71 mL/min/{1.73_m2} (ref >=60)
GFR, Est Non African American: 61 mL/min/{1.73_m2} (ref >=60)
Globulin: 3.1 g/dL (calc) (ref 1.9–3.7)
Glucose, Bld: 88 mg/dL (ref 65–99)
Potassium: 4.9 mmol/L (ref 3.5–5.3)
Sodium: 141 mmol/L (ref 135–146)
Total Bilirubin: 0.6 mg/dL (ref 0.2–1.2)
Total Protein: 7.4 g/dL (ref 6.1–8.1)

## 2021-06-15 LAB — CBC WITH DIFFERENTIAL/PLATELET
Absolute Monocytes: 423 cells/uL (ref 200–950)
Basophils Absolute: 58 cells/uL (ref 0–200)
Basophils Relative: 1 %
Eosinophils Absolute: 342 cells/uL (ref 15–500)
Eosinophils Relative: 5.9 %
HCT: 43.7 % (ref 35.0–45.0)
Hemoglobin: 14 g/dL (ref 11.7–15.5)
Lymphs Abs: 2482 cells/uL (ref 850–3900)
MCH: 26.1 pg — ABNORMAL LOW (ref 27.0–33.0)
MCHC: 32 g/dL (ref 32.0–36.0)
MCV: 81.4 fL (ref 80.0–100.0)
MPV: 11 fL (ref 7.5–12.5)
Monocytes Relative: 7.3 %
Neutro Abs: 2494 cells/uL (ref 1500–7800)
Neutrophils Relative %: 43 %
Platelets: 293 10*3/uL (ref 140–400)
RBC: 5.37 10*6/uL — ABNORMAL HIGH (ref 3.80–5.10)
RDW: 13.8 % (ref 11.0–15.0)
Total Lymphocyte: 42.8 %
WBC: 5.8 10*3/uL (ref 3.8–10.8)

## 2021-06-15 LAB — HEMOGLOBIN A1C
Hgb A1c MFr Bld: 6 % of total Hgb — ABNORMAL HIGH (ref <5.7)
Mean Plasma Glucose: 126 mg/dL
eAG (mmol/L): 7 mmol/L

## 2021-06-15 LAB — LIPID PANEL
Cholesterol: 187 mg/dL (ref <200)
HDL: 42 mg/dL — ABNORMAL LOW (ref >=50)
LDL Cholesterol (Calc): 121 mg/dL (calc) — ABNORMAL HIGH
Non-HDL Cholesterol (Calc): 145 mg/dL (calc) — ABNORMAL HIGH (ref <130)
Total CHOL/HDL Ratio: 4.5 (calc) (ref <5.0)
Triglycerides: 128 mg/dL (ref <150)

## 2021-06-15 LAB — TSH: TSH: 2.65 mIU/L

## 2021-06-15 LAB — VITAMIN D 25 HYDROXY (VIT D DEFICIENCY, FRACTURES): Vit D, 25-Hydroxy: 48 ng/mL (ref 30–100)

## 2021-07-06 ENCOUNTER — Ambulatory Visit: Payer: Self-pay

## 2021-07-07 ENCOUNTER — Encounter: Payer: Self-pay | Admitting: Family Medicine

## 2021-07-07 ENCOUNTER — Other Ambulatory Visit: Payer: Self-pay

## 2021-07-07 ENCOUNTER — Ambulatory Visit
Admission: RE | Admit: 2021-07-07 | Discharge: 2021-07-07 | Disposition: A | Discharge status: Home / Self Care | Payer: Medicaid Other | Source: Ambulatory Visit | Attending: Family Medicine | Admitting: Family Medicine

## 2021-07-07 VITALS — BP 121/95 | HR 80 | Temp 98.4°F | Resp 18

## 2021-07-07 DIAGNOSIS — H00014 Hordeolum externum left upper eyelid: Secondary | ICD-10-CM | POA: Diagnosis not present

## 2021-07-07 MED ORDER — MOXIFLOXACIN HCL 0.5 % OP SOLN: 1.0000 [drp] | Freq: Three times a day (TID) | OPHTHALMIC | 0 refills | Status: AC

## 2021-07-07 NOTE — ED Provider Notes (Signed)
[facility]    CSN: [id number] Arrival date & time: 07/07/21  1808      History   Chief Complaint Chief Complaint  Patient presents with   Eye Problem    HPI 45 year old female presents with the above complaint.  Symptoms started on Tuesday.  She reports left eye irritation, left upper eyelid swelling, and some discharge.  She states that the eye itches as well.  Pain 4/10 in severity.  No relieving factors.  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 medically.   Cough due to ACE inhibitor    Dizziness    Edema leg    Elevated troponin I level 12/31/2015   Endometriosis    Hypertension    a. echo 03/2012 EF 60-65%, moderate LVH, nl LV diastolic fxn, nl PASP; b. 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/06/2020   Cervical dysplasia 11/24/2019   Hypertension due to endocrine disorder 12/10/2018   Pre-diabetes 09/27/2018   Prolapsed internal hemorrhoids, grade 2 08/28/2018   History of endometriosis 04/10/2018   GAD (generalized anxiety disorder) 04/10/2018   Mild recurrent major depression ([place]) 04/10/2018   Obesity (BMI 30.0-34.9) 04/10/2018   ADHD (attention deficit hyperactivity disorder), 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   Family history of malignant neoplasm of gastrointestinal tract    Accessory spleen  11/14/2016   Allergic rhinitis 02/04/2016   Bilateral leg edema 04/09/2012    Past Surgical History:  Procedure Laterality Date   COLONOSCOPY WITH PROPOFOL N/A 04/09/2017   Procedure: COLONOSCOPY WITH PROPOFOL;  Surgeon: Lucilla Lame, MD;  Location: [place];  Service: Endoscopy;  Laterality: N/A;  LATEX sensitivity   IR GENERIC HISTORICAL  07/07/2016   IR VENOGRAM RENAL UNI LEFT 07/07/2016 Aletta Edouard, MD [facility]   IR GENERIC HISTORICAL  07/07/2016   IR VENOUS SAMPLING 07/07/2016 Aletta Edouard, MD [facility]   IR GENERIC HISTORICAL  07/07/2016   IR VENOGRAM ADRENAL BI 07/07/2016 Aletta Edouard, MD [facility]   IR GENERIC HISTORICAL  07/07/2016   IR US GUIDE VASC ACCESS RIGHT 07/07/2016 Aletta Edouard, MD [facility]   IR GENERIC HISTORICAL  07/07/2016   IR ANGIOGRAM SELECTIVE EACH ADDITIONAL VESSEL 07/07/2016 Aletta Edouard, MD [facility]   IR GENERIC HISTORICAL  07/07/2016   IR VENOUS SAMPLING 07/07/2016 Aletta Edouard, MD [facility]   IR GENERIC HISTORICAL  07/07/2016   IR US GUIDE VASC ACCESS RIGHT 07/07/2016 Aletta Edouard, MD [facility]   LAPAROSCOPIC SUPRACERVICAL HYSTERECTOMY  09/27/2010   For endometriosis. Ovaries and tubes are in place.   NASAL SEPTUM SURGERY     ROBOTIC ADRENALECTOMY Left 10/27/2016   Procedure: XI ROBOTIC LEFT ADRENALECTOMY;  Surgeon: Michael Boston, MD;  Location: [facility];  Service: General;  Laterality: Left;   TONSILLECTOMY     TUBAL LIGATION  2006   WISDOM TOOTH EXTRACTION      OB History     Gravida  2   Para  2   Term  2   Preterm      AB      Living  2      SAB      IAB      Ectopic      Multiple      Live Births  2            Home Medications    Prior to Admission medications   Medication Sig Start Date End Date Taking? Authorizing Provider  moxifloxacin (VIGAMOX) 0.5 % ophthalmic solution Place 1 drop into the left eye 3 (three) times daily for 7 days. 07/07/21 07/14/21 Yes Roda Lauture G, DO   Daridorexant HCl (QUVIVIQ) 50 MG TABS Take 1 tablet by mouth at bedtime. 06/14/21   Steele Sizer, MD  escitalopram (LEXAPRO) 10 MG tablet Take 1 tablet (10 mg total) by mouth daily. 06/14/21   Steele Sizer, MD  fluticasone (FLONASE) 50 MCG/ACT nasal spray Place 2 sprays into both nostrils daily. 12/07/20   Steele Sizer, MD  levocetirizine (XYZAL) 5 MG tablet Take 1 tablet (5 mg total) by mouth every evening. 06/15/20   Steele Sizer, MD  methylphenidate 27 MG PO CR tablet Take 1 tablet (27 mg total) by mouth every morning. 06/14/21   Steele Sizer, MD  metoprolol succinate (TOPROL-XL) 50 MG 24 hr tablet Take with or immediately following a meal. 06/14/21   Sowles, Drue Stager, MD  spironolactone-hydrochlorothiazide (ALDACTAZIDE) 25-25 MG tablet Take 1 tablet by mouth daily. 06/14/21   Steele Sizer, MD  lisinopril-hydrochlorothiazide (PRINZIDE) 20-12.5 MG per tablet Take 1 tablet by mouth daily. 07/05/11 03/05/12  Martinique, Peter M, MD  torsemide (DEMADEX) 10 MG tablet Take 10 mg by mouth daily.    03/05/12  [provider]    Family History Family History  Problem Relation Age of Onset   COPD Mother    Hypotension Mother    Colon cancer Father    Emphysema Father    Cancer Father        colon   Hyperlipidemia Sister    Heart failure Paternal Uncle    Heart attack Cousin 85   Heart disease Cousin        heart attack   Breast cancer Maternal Aunt    Adrenal disorder Neg Hx     Social History Social History   Tobacco Use   Smoking status: Never   Smokeless tobacco: Never  Vaping Use   Vaping Use: Never used  Substance Use Topics   Alcohol use: Yes    Comment: social (1 drink/mo)   Drug use: No     Allergies   Ace inhibitors, Latex, and Pollen extract   Review of Systems Review of Systems Per HPI  Physical Exam Triage Vital Signs ED Triage Vitals  Enc Vitals Group     BP 07/07/21 1820 (!) 121/95     Pulse Rate 07/07/21 1820 80     Resp 07/07/21 1820  18     Temp 07/07/21 1820 98.4 F (36.9 C)     Temp src --      SpO2 07/07/21 1820 100 %     Weight --      Height --      Head Circumference --      Peak Flow --      Pain Score 07/07/21 1818 4  Pain Loc --      Pain Edu? --      Excl. in [place]? --    Updated Vital Signs BP (!) 121/95   Pulse 80   Temp 98.4 F (36.9 C)   Resp 18   SpO2 100%   Visual Acuity Right Eye Distance:   Left Eye Distance:   Bilateral Distance:    Right Eye Near:   Left Eye Near:    Bilateral Near:     Physical Exam Constitutional:      General: She is not in acute distress.    Appearance: Normal appearance. She is not ill-appearing.  HENT:     Head: Normocephalic and atraumatic.  Eyes:     Comments: Swelling of the left upper eyelid.  No discharge on exam.  Pulmonary:     Effort: Pulmonary effort is normal. No respiratory distress.  Neurological:     Mental Status: She is alert.  Psychiatric:        Mood and Affect: Mood normal.        Behavior: Behavior normal.     UC Treatments / Results  Labs (all labs ordered are listed, but only abnormal results are displayed) Labs Reviewed - No data to display  EKG   Radiology No results found.  Procedures Procedures (including critical care time)  Medications Ordered in UC Medications - No data to display  Initial Impression / Assessment and Plan / UC Course  I have reviewed the triage vital signs and the nursing notes.  Pertinent labs & imaging results that were available during my care of the patient were reviewed by me and considered in my medical decision making (see chart for details).    45 year old female presents with a stye.  Vigamox as prescribed.  Warm compresses.  Supportive care.  Final Clinical Impressions(s) / UC Diagnoses   Final diagnoses:  Hordeolum externum of left upper eyelid     Discharge Instructions      Warm compresses.  Medication as prescribed.  If continues to persist, see [facility].  Take care  Dr. Lacinda Axon    ED Prescriptions     Medication Sig Dispense Auth. Provider   moxifloxacin (VIGAMOX) 0.5 % ophthalmic solution Place 1 drop into the left eye 3 (three) times daily for 7 days. 3 mL Coral Spikes, DO      PDMP not reviewed this encounter.   Coral Spikes, [place] 07/07/21 [phone]

## 2021-07-07 NOTE — ED Triage Notes (Signed)
Pt is present today with left eye irritation. Pt eye is visibly swollen and irritated. Pt states that the irritation started Tuesday.

## 2021-07-07 NOTE — Discharge Instructions (Addendum)
Warm compresses.  Medication as prescribed.  If continues to persist, see [place].  Take care  Dr. Lacinda Axon

## 2021-07-11 NOTE — Progress Notes (Signed)
Name: Amanda Fields   MRN: [id number]    DOB: 13-Aug-1976   Date:07/12/2021       Progress Note  Subjective  Chief Complaint  Surgical Clearance  HPI  Pre Op: patient is having a Tummy Tuck done next month, pre op requested, including labs by Dr. Leroy Sea.   Patient never had complications from previous surgeries, she had a tubal ligation and HCG test is not going to be done.   She has good exercise tolerance, able to perform 4 METS of activity, no history of heart attacks or strokes. Denies any recent URI.   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[place]) 02/06/2020   Cervical dysplasia 11/24/2019   Hypertension due to endocrine disorder 12/10/2018   Pre-diabetes 09/27/2018   Prolapsed internal hemorrhoids, grade 2 08/28/2018   History of endometriosis 04/10/2018   GAD (generalized anxiety disorder) 04/10/2018   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/10/2018   Obesity (BMI 30.0-34.9) 04/10/2018   ADHD (attention deficit hyperactivity disorder), 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   Family history of malignant neoplasm of gastrointestinal tract    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   Allergic rhinitis 02/04/2016   Bilateral leg edema 04/09/2012    Past Surgical History:  Procedure Laterality Date   COLONOSCOPY WITH PROPOFOL N/A 04/09/2017   Procedure: COLONOSCOPY WITH PROPOFOL;  Surgeon: Lucilla Lame, MD;  Location: [place];  Service: Endoscopy;  Laterality: N/A;  LATEX sensitivity   IR GENERIC HISTORICAL  07/07/2016   IR VENOGRAM RENAL UNI LEFT 07/07/2016 Aletta Edouard, MD [facility]   IR GENERIC HISTORICAL  07/07/2016   IR VENOUS  SAMPLING 07/07/2016 Aletta Edouard, MD [facility]   IR GENERIC HISTORICAL  07/07/2016   IR VENOGRAM ADRENAL BI 07/07/2016 Aletta Edouard, MD [facility]   IR GENERIC HISTORICAL  07/07/2016   IR US GUIDE VASC ACCESS RIGHT 07/07/2016 Aletta Edouard, MD [facility]   IR GENERIC HISTORICAL  07/07/2016   IR ANGIOGRAM SELECTIVE EACH ADDITIONAL VESSEL 07/07/2016 Aletta Edouard, MD [facility]   IR GENERIC HISTORICAL  07/07/2016   IR VENOUS SAMPLING 07/07/2016 Aletta Edouard, MD [facility]   IR GENERIC HISTORICAL  07/07/2016   IR US GUIDE VASC ACCESS RIGHT 07/07/2016 Aletta Edouard, MD [facility]   LAPAROSCOPIC SUPRACERVICAL HYSTERECTOMY  09/27/2010   For endometriosis. Ovaries and tubes are in place.   NASAL SEPTUM SURGERY     ROBOTIC ADRENALECTOMY Left 10/27/2016   Procedure: XI ROBOTIC LEFT ADRENALECTOMY;  Surgeon: Michael Boston, MD;  [facility];  Service: General;  Laterality: Left;   TONSILLECTOMY     TUBAL LIGATION  2006   WISDOM TOOTH EXTRACTION      Family History  Problem Relation Age of Onset   COPD Mother    Hypotension Mother    Colon cancer Father    Emphysema Father    Cancer Father        colon   Hyperlipidemia Sister    Heart failure Paternal Uncle    Heart attack Cousin 91   Heart disease Cousin        heart attack   Breast cancer Maternal Aunt  Adrenal disorder Neg Hx     Social History   Tobacco Use   Smoking status: Never   Smokeless tobacco: Never  Substance Use Topics   Alcohol use: Yes    Comment: social (1 drink/mo)     Current Outpatient Medications:    Daridorexant HCl (QUVIVIQ) 50 MG TABS, Take 1 tablet by mouth at bedtime., Disp: 30 tablet, Rfl: 0   escitalopram (LEXAPRO) 10 MG tablet, Take 1 tablet (10 mg total) by mouth daily., Disp: 90 tablet, Rfl: 1   fluticasone (FLONASE) 50 MCG/ACT nasal spray, Place 2 sprays into both nostrils daily., Disp: 48 g, Rfl: 1   levocetirizine (XYZAL) 5 MG tablet, Take 1 tablet (5 mg total) by mouth  every evening., Disp: 90 tablet, Rfl: 1   methylphenidate 27 MG PO CR tablet, Take 1 tablet (27 mg total) by mouth every morning., Disp: 30 tablet, Rfl: 0   metoprolol succinate (TOPROL-XL) 50 MG 24 hr tablet, Take with or immediately following a meal., Disp: 90 tablet, Rfl: 1   moxifloxacin (VIGAMOX) 0.5 % ophthalmic solution, Place 1 drop into the left eye 3 (three) times daily for 7 days., Disp: 3 mL, Rfl: 0   spironolactone-hydrochlorothiazide (ALDACTAZIDE) 25-25 MG tablet, Take 1 tablet by mouth daily., Disp: 90 tablet, Rfl: 1  Allergies  Allergen Reactions   Ace Inhibitors Cough   Latex Itching    Gloves, condoms   Pollen Extract     I personally reviewed active problem list, medication list, allergies, family history, social history with the patient/caregiver today.   ROS  Constitutional: Negative for fever , positive for  weight change.  Respiratory: Negative for cough and shortness of breath.   Cardiovascular: Negative for chest pain or palpitations.  Gastrointestinal: Negative for abdominal pain, no bowel changes.  Musculoskeletal: Negative for gait problem or joint swelling.  Skin: Negative for rash.  Neurological: Negative for dizziness or headache.  No other specific complaints in a complete review of systems (except as listed in HPI above).   Objective  Vitals:   07/12/21 1514  BP: 120/78  Pulse: 71  Resp: 16  Temp: 98 F (36.7 C)  TempSrc: Oral  SpO2: 97%  Weight: 205 lb (93 kg)  Height: 5' 6"  (1.676 m)    Body mass index is 33.09 kg/m.  Physical Exam  Constitutional: Patient appears well-developed and well-nourished. Obese  No distress.  HEENT: head atraumatic, normocephalic, pupils equal and reactive to light, neck supple Cardiovascular: Normal rate, regular rhythm and normal heart sounds.  No murmur heard. No BLE edema. Pulmonary/Chest: Effort normal and breath sounds normal. No respiratory distress. Abdominal: Soft.  There is no  tenderness. Psychiatric: Patient has a normal mood and affect. behavior is normal. Judgment and thought content normal.   Recent Results (from the past 2160 hour(s))  Lipid panel     Status: Abnormal   Collection Time: 06/14/21  3:08 PM  Result Value Ref Range   Cholesterol 187 <200 mg/dL   HDL 42 (L) > OR = 50 mg/dL   Triglycerides 128 <150 mg/dL   LDL Cholesterol (Calc) 121 (H) mg/dL (calc)    Comment: Reference range: <100 . Desirable range <100 mg/dL for primary prevention;   <70 mg/dL for patients with CHD or diabetic patients  with > or = 2 CHD risk factors. Marland Kitchen LDL-C is now calculated using the Martin-Hopkins  calculation, which is a validated novel method providing  better accuracy than the Friedewald equation in the  estimation of LDL-C.  Cresenciano Genre et al. Annamaria Helling. 2585;277(82): 2061-2068  (http://education.[facility].com/faq/FAQ164)    Total CHOL/HDL Ratio 4.5 <5.0 (calc)   Non-HDL Cholesterol (Calc) 145 (H) <130 mg/dL (calc)    Comment: For patients with diabetes plus 1 major ASCVD risk  factor, treating to a non-HDL-C goal of <100 mg/dL  (LDL-C of <70 mg/dL) is considered a therapeutic  option.   COMPLETE METABOLIC PANEL WITH GFR     Status: None   Collection Time: 06/14/21  3:08 PM  Result Value Ref Range   Glucose, Bld 88 65 - 99 mg/dL    Comment: .            Fasting reference interval .    BUN 11 7 - 25 mg/dL   Creat 1.08 0.50 - 1.10 mg/dL   GFR, Est Non African American 61 > OR = 60 mL/min/1.72m   GFR, Est African American 71 > OR = 60 mL/min/1.758m  BUN/Creatinine Ratio NOT APPLICABLE 6 - 22 (calc)   Sodium 141 135 - 146 mmol/L   Potassium 4.9 3.5 - 5.3 mmol/L   Chloride 105 98 - 110 mmol/L   CO2 29 20 - 32 mmol/L   Calcium 9.9 8.6 - 10.2 mg/dL   Total Protein 7.4 6.1 - 8.1 g/dL   Albumin 4.3 3.6 - 5.1 g/dL   Globulin 3.1 1.9 - 3.7 g/dL (calc)   AG Ratio 1.4 1.0 - 2.5 (calc)   Total Bilirubin 0.6 0.2 - 1.2 mg/dL   Alkaline phosphatase (APISO)  47 31 - 125 U/L   AST 24 10 - 35 U/L   ALT 17 6 - 29 U/L  CBC with Differential/Platelet     Status: Abnormal   Collection Time: 06/14/21  3:08 PM  Result Value Ref Range   WBC 5.8 3.8 - 10.8 Thousand/uL   RBC 5.37 (H) 3.80 - 5.10 Million/uL   Hemoglobin 14.0 11.7 - 15.5 g/dL   HCT 43.7 35.0 - 45.0 %   MCV 81.4 80.0 - 100.0 fL   MCH 26.1 (L) 27.0 - 33.0 pg   MCHC 32.0 32.0 - 36.0 g/dL   RDW 13.8 11.0 - 15.0 %   Platelets 293 140 - 400 Thousand/uL   MPV 11.0 7.5 - 12.5 fL   Neutro Abs 2,494 1,500 - 7,800 cells/uL   Lymphs Abs 2,482 850 - 3,900 cells/uL   Absolute Monocytes 423 200 - 950 cells/uL   Eosinophils Absolute 342 15 - 500 cells/uL   Basophils Absolute 58 0 - 200 cells/uL   Neutrophils Relative % 43 %   Total Lymphocyte 42.8 %   Monocytes Relative 7.3 %   Eosinophils Relative 5.9 %   Basophils Relative 1.0 %  Hemoglobin A1c     Status: Abnormal   Collection Time: 06/14/21  3:08 PM  Result Value Ref Range   Hgb A1c MFr Bld 6.0 (H) <5.7 % of total Hgb    Comment: For someone without known diabetes, a hemoglobin  A1c value between 5.7% and 6.4% is consistent with prediabetes and should be confirmed with a  follow-up test. . For someone with known diabetes, a value <7% indicates that their diabetes is well controlled. A1c targets should be individualized based on duration of diabetes, age, comorbid conditions, and other considerations. . This assay result is consistent with an increased risk of diabetes. . Currently, no consensus exists regarding use of hemoglobin A1c for diagnosis of diabetes for children. .    Mean Plasma Glucose 126 mg/dL   eAG (mmol/L)  7.0 mmol/L  VITAMIN D 25 Hydroxy (Vit-D Deficiency, Fractures)     Status: None   Collection Time: 06/14/21  3:08 PM  Result Value Ref Range   Vit D, 25-Hydroxy 48 30 - 100 ng/mL    Comment: Vitamin D Status         25-OH Vitamin D: . Deficiency:                    <20 ng/mL Insufficiency:             20  - 29 ng/mL Optimal:                 > or = 30 ng/mL . For 25-OH Vitamin D testing on patients on  D2-supplementation and patients for whom quantitation  of D2 and D3 fractions is required, the [facility](TM) 25-OH VIT D, (D2,D3), LC/MS/MS is recommended: order  code [id number] (patients >9yr). See Note 1 . Note 1 . For additional information, please refer to  http://education.[facility].com/faq/FAQ199  (This link is being provided for informational/ educational purposes only.)   TSH     Status: None   Collection Time: 06/14/21  3:08 PM  Result Value Ref Range   TSH 2.65 mIU/L    Comment:           Reference Range .           > or = 20 Years  0.40-4.50 .                Pregnancy Ranges           First trimester    0.26-2.66           Second trimester   0.55-2.73           Third trimester    0.43-2.91      PHQ2/9: [facility] 07/12/2021 06/14/2021 03/22/2021 12/07/2020 11/19/2020  Decreased Interest 0 1 0 0 1  Down, Depressed, Hopeless 1 0 0 1 1  PHQ - 2 Score 1 1 0 1 2  Altered sleeping 3 3 0 2 3  Tired, decreased energy 0 0 1 1 0  Change in appetite 0 3 3 1 3   Feeling bad or failure about yourself  0 0 0 1 0  Trouble concentrating 0 0 3 2 3   Moving slowly or fidgety/restless 0 0 0 0 3  Suicidal thoughts 0 0 0 0 1  PHQ-9 Score 4 7 7 8 15   Difficult doing work/chores - - - Somewhat difficult Not difficult at all  Some recent data might be hidden    phq 9 is positive   Fall Risk: Fall Risk  07/12/2021 06/14/2021 03/22/2021 12/07/2020 11/19/2020  Falls in the past year? 0 0 0 0 0  Number falls in past yr: 0 0 0 0 0  Injury with Fall? 0 0 0 0 0  Follow up - - - - -      Functional Status Survey: Is the patient deaf or have difficulty hearing?: No Does the patient have difficulty seeing, even when wearing glasses/contacts?: No Does the patient have difficulty concentrating, remembering, or making decisions?: Yes Does the patient have difficulty  walking or climbing stairs?: No Does the patient have difficulty dressing or bathing?: No Does the patient have difficulty doing errands alone such as visiting a doctor's office or shopping?: No    Assessment & Plan  1. HTN (hypertension), benign  At goal , continue medication   2.  Pre-op evaluation  May proceed to surgery with a average risk of complications   - EKG 87-OMVE - NSR  - Protime-INR - PTT - Urinalysis, Complete

## 2021-07-12 ENCOUNTER — Other Ambulatory Visit: Payer: Self-pay

## 2021-07-12 ENCOUNTER — Ambulatory Visit: Payer: Medicaid Other | Admitting: Family Medicine

## 2021-07-12 ENCOUNTER — Encounter: Payer: Self-pay | Admitting: Family Medicine

## 2021-07-12 VITALS — BP 120/78 | HR 71 | Temp 98.0°F | Resp 16 | Ht 66.0 in | Wt 205.0 lb

## 2021-07-12 DIAGNOSIS — I10 Essential (primary) hypertension: Secondary | ICD-10-CM | POA: Diagnosis not present

## 2021-07-12 DIAGNOSIS — Z01818 Encounter for other preprocedural examination: Secondary | ICD-10-CM | POA: Diagnosis not present

## 2021-07-13 DIAGNOSIS — Z01818 Encounter for other preprocedural examination: Secondary | ICD-10-CM | POA: Diagnosis not present

## 2021-07-14 ENCOUNTER — Telehealth: Payer: Self-pay

## 2021-07-14 LAB — PROTIME-INR
INR: 1
Prothrombin Time: 10.2 s (ref 9.0–11.5)

## 2021-07-14 LAB — URINALYSIS, COMPLETE
Bilirubin Urine: NEGATIVE
Glucose, UA: NEGATIVE
Hgb urine dipstick: NEGATIVE
Hyaline Cast: NONE SEEN /LPF
Ketones, ur: NEGATIVE
Leukocytes,Ua: NEGATIVE
Nitrite: NEGATIVE
Protein, ur: NEGATIVE
RBC / HPF: NONE SEEN /HPF (ref 0–2)
Specific Gravity, Urine: 1.036 — ABNORMAL HIGH (ref 1.001–1.035)
pH: 5.5 (ref 5.0–8.0)

## 2021-07-14 LAB — APTT: aPTT: 28 s (ref 23–32)

## 2021-07-14 NOTE — Telephone Encounter (Signed)
Forms faxed

## 2021-07-14 NOTE — Telephone Encounter (Signed)
Copied from [place] [id number]. Topic: General - Other >> Jul 14, 2021 10:21 AM Alanda Slim E wrote: Reason for CRM: Dr. Aretha Parrot at [place] needs a copy of results from Surgical clearance(chest xray, EKG, abdominal ultrasound, bloodwork and medical clearance) / please advise  fax# [phone]

## 2021-07-22 ENCOUNTER — Ambulatory Visit
Admission: RE | Admit: 2021-07-22 | Discharge: 2021-07-22 | Disposition: A | Discharge status: Home / Self Care | Payer: Medicaid Other | Attending: Plastic Surgery | Admitting: Plastic Surgery

## 2021-07-22 ENCOUNTER — Other Ambulatory Visit: Payer: Self-pay

## 2021-07-22 ENCOUNTER — Other Ambulatory Visit: Payer: Self-pay | Admitting: Plastic Surgery

## 2021-07-22 ENCOUNTER — Ambulatory Visit
Admission: RE | Admit: 2021-07-22 | Discharge: 2021-07-22 | Disposition: A | Discharge status: Home / Self Care | Payer: Medicaid Other | Source: Ambulatory Visit | Attending: Plastic Surgery | Admitting: Plastic Surgery

## 2021-07-22 DIAGNOSIS — Z01818 Encounter for other preprocedural examination: Secondary | ICD-10-CM | POA: Insufficient documentation

## 2021-07-25 ENCOUNTER — Other Ambulatory Visit (HOSPITAL_COMMUNITY): Payer: Self-pay | Admitting: Plastic Surgery

## 2021-07-25 ENCOUNTER — Other Ambulatory Visit: Payer: Self-pay | Admitting: Plastic Surgery

## 2021-07-25 DIAGNOSIS — K429 Umbilical hernia without obstruction or gangrene: Secondary | ICD-10-CM

## 2021-07-26 ENCOUNTER — Other Ambulatory Visit: Payer: Self-pay

## 2021-07-26 ENCOUNTER — Ambulatory Visit
Admission: RE | Admit: 2021-07-26 | Discharge: 2021-07-26 | Disposition: A | Discharge status: Home / Self Care | Payer: Medicaid Other | Source: Ambulatory Visit | Attending: Plastic Surgery | Admitting: Plastic Surgery

## 2021-07-26 DIAGNOSIS — K429 Umbilical hernia without obstruction or gangrene: Secondary | ICD-10-CM | POA: Insufficient documentation

## 2021-08-15 ENCOUNTER — Telehealth: Payer: Medicaid Other | Admitting: Family

## 2021-08-15 DIAGNOSIS — B9689 Other specified bacterial agents as the cause of diseases classified elsewhere: Secondary | ICD-10-CM

## 2021-08-15 DIAGNOSIS — J019 Acute sinusitis, unspecified: Secondary | ICD-10-CM | POA: Diagnosis not present

## 2021-08-15 DIAGNOSIS — N760 Acute vaginitis: Secondary | ICD-10-CM | POA: Diagnosis not present

## 2021-08-15 MED ORDER — METRONIDAZOLE 500 MG PO TABS: 500.0000 mg | ORAL_TABLET | Freq: Two times a day (BID) | ORAL | 0 refills | Status: DC

## 2021-08-15 MED ORDER — AMOXICILLIN-POT CLAVULANATE 875-125 MG PO TABS: 1.0000 | ORAL_TABLET | Freq: Two times a day (BID) | ORAL | 0 refills | Status: DC

## 2021-08-15 NOTE — Progress Notes (Signed)
Virtual Visit Consent   Amanda Fields, you are scheduled for a virtual visit with a [facility] provider today.     Just as with appointments in the office, your consent must be obtained to participate.  Your consent will be active for this visit and any virtual visit you may have with one of our providers in the next 365 days.     If you have a MyChart account, a copy of this consent can be sent to you electronically.  All virtual visits are billed to your insurance company just like a traditional visit in the office.    As this is a virtual visit, video technology does not allow for your provider to perform a traditional examination.  This may limit your provider's ability to fully assess your condition.  If your provider identifies any concerns that need to be evaluated in person or the need to arrange testing (such as labs, EKG, etc.), we will make arrangements to do so.     Although advances in technology are sophisticated, we cannot ensure that it will always work on either your end or our end.  If the connection with a video visit is poor, the visit may have to be switched to a telephone visit.  With either a video or telephone visit, we are not always able to ensure that we have a secure connection.     I need to obtain your verbal consent now.   Are you willing to proceed with your visit today?    Amanda Fields has provided verbal consent on 08/15/2021 for a virtual visit (video or telephone).   Evelina Dun, FNP   Date: 08/15/2021 6:22 PM   Virtual Visit via Video Note   I, Evelina Dun, connected with  Amanda Fields  ([id number], Jul 23, 1976) on 08/15/21 at  6:30 PM EDT by a video-enabled telemedicine application and verified that I am speaking with the correct person using two identifiers.  Location: Patient: Virtual Visit Location Patient: Home Provider: Virtual Visit Location Provider: Home   I discussed the limitations of evaluation and management by telemedicine  and the availability of in person appointments. The patient expressed understanding and agreed to proceed.    History of Present Illness: Amanda Fields is a 45 y.o. who identifies as a female who was assigned female at birth, and is being seen today for sinus issues and BV.  HPI: Sinusitis This is a new problem. The current episode started 1 to 4 weeks ago. The problem has been gradually worsening since onset. There has been no fever. Her pain is at a severity of 7/10. Associated symptoms include congestion, headaches, sinus pressure and sneezing. Pertinent negatives include no coughing, ear pain, shortness of breath or sore throat. (Teeth pain) Past treatments include acetaminophen and oral decongestants. The treatment provided mild relief.  Vaginal Discharge The patient's primary symptoms include genital itching, a genital odor and vaginal discharge. The patient's pertinent negatives include no genital lesions. This is a new problem. Episode onset: changed her soap three weeks ago. The problem has been gradually worsening. Associated symptoms include headaches. Pertinent negatives include no sore throat. The vaginal discharge was grey.   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/10/2018   Obesity (BMI 30.0-34.9) 04/10/2018   ADHD (attention deficit hyperactivity disorder), 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   Family history of malignant neoplasm of gastrointestinal tract     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   Allergic rhinitis 02/04/2016   Bilateral leg edema 04/09/2012    Allergies:  Allergies  Allergen Reactions   Ace Inhibitors Cough   Latex Itching    Gloves, condoms   Pollen Extract    Medications:  Current Outpatient Medications:    amoxicillin-clavulanate (AUGMENTIN) 875-125 MG tablet, Take 1 tablet by mouth 2 (two) times daily., Disp: 14 tablet, Rfl: 0   metroNIDAZOLE (FLAGYL) 500 MG tablet, Take 1 tablet (500 mg total) by mouth 2 (two) times daily., Disp: 14 tablet, Rfl: 0   Daridorexant HCl (QUVIVIQ) 50 MG TABS, Take 1 tablet by mouth at bedtime., Disp: 30 tablet, Rfl: 0   escitalopram (LEXAPRO) 10 MG tablet, Take 1 tablet (10 mg total) by mouth daily., Disp: 90 tablet, Rfl: 1   fluticasone (FLONASE) 50 MCG/ACT nasal spray, Place 2 sprays into both nostrils daily., Disp: 48 g, Rfl: 1   levocetirizine (XYZAL) 5 MG tablet, Take 1 tablet (5 mg total) by mouth every evening., Disp: 90 tablet, Rfl: 1   methylphenidate 27 MG PO CR tablet, Take 1 tablet (27 mg total) by mouth every morning., Disp: 30 tablet, Rfl: 0   metoprolol succinate (TOPROL-XL) 50 MG 24 hr tablet, Take with or immediately following a meal., Disp: 90 tablet, Rfl: 1   spironolactone-hydrochlorothiazide (ALDACTAZIDE) 25-25 MG tablet, Take 1 tablet by mouth daily., Disp: 90 tablet, Rfl: 1  Observations/Objective: Patient is well-developed, well-nourished in no acute distress.  Resting comfortably  at home.  Head is normocephalic, atraumatic.  No labored breathing. Speech is clear and coherent with logical content.  Patient is alert and oriented at baseline.    Assessment and Plan: 1. Acute sinusitis, recurrence not specified, unspecified location - amoxicillin-clavulanate (AUGMENTIN) 875-125 MG tablet; Take 1 tablet by mouth 2 (two) times daily.  Dispense: 14 tablet; Refill: 0  2. BV (bacterial vaginosis) - metroNIDAZOLE (FLAGYL) 500 MG tablet; Take 1 tablet (500 mg total) by mouth 2  (two) times daily.  Dispense: 14 tablet; Refill: 0 - Take meds as prescribed - Use a cool mist humidifier  -Use saline nose sprays frequently -Force fluids -For any cough or congestion  Use plain Mucinex- regular strength or max strength is fine -For fever or aces or pains- take tylenol or ibuprofen. -Throat lozenges if help  Avoid changes soaps, mild soaps Avoid douching   Follow Up Instructions: I discussed the assessment and treatment plan with the patient. The patient was provided an opportunity to ask questions and all were answered. The patient agreed with the plan and demonstrated an understanding of the instructions.  A copy of instructions were sent to the patient via MyChart.  The patient was advised to call back or seek an in-person evaluation if the symptoms worsen or if the condition fails to improve as anticipated.  Time:  I spent 6 minutes with the patient via telehealth technology discussing the above problems/concerns.    Evelina Dun, FNP

## 2021-09-08 ENCOUNTER — Other Ambulatory Visit (HOSPITAL_COMMUNITY)
Admission: RE | Admit: 2021-09-08 | Discharge: 2021-09-08 | Disposition: A | Discharge status: Home / Self Care | Payer: Medicaid Other | Source: Ambulatory Visit | Attending: Family Medicine | Admitting: Family Medicine

## 2021-09-08 ENCOUNTER — Ambulatory Visit (INDEPENDENT_AMBULATORY_CARE_PROVIDER_SITE_OTHER): Payer: Medicaid Other | Admitting: *Deleted

## 2021-09-08 ENCOUNTER — Other Ambulatory Visit: Payer: Self-pay

## 2021-09-08 VITALS — BP 142/86 | HR 71

## 2021-09-08 DIAGNOSIS — Z113 Encounter for screening for infections with a predominantly sexual mode of transmission: Secondary | ICD-10-CM | POA: Insufficient documentation

## 2021-09-08 DIAGNOSIS — N898 Other specified noninflammatory disorders of vagina: Secondary | ICD-10-CM

## 2021-09-08 DIAGNOSIS — R8290 Unspecified abnormal findings in urine: Secondary | ICD-10-CM | POA: Diagnosis not present

## 2021-09-08 LAB — POCT URINALYSIS DIPSTICK

## 2021-09-08 MED ORDER — NITROFURANTOIN MONOHYD MACRO 100 MG PO CAPS: 100.0000 mg | ORAL_CAPSULE | Freq: Two times a day (BID) | ORAL | 0 refills | Status: DC

## 2021-09-08 NOTE — Progress Notes (Signed)
SUBJECTIVE:  45 y.o. female complains of vaginal irritation for few day(s) and  malodorous urine . Denies abnormal vaginal bleeding or significant pelvic pain or fever.Denies history of known exposure to STD.  No LMP recorded. Patient has had a hysterectomy.  OBJECTIVE:  She appears alert, well appearing, in no apparent distress Urine dipstick: positive for RBC's and positive for leukocytes.  ASSESSMENT:  Vaginal Irritation Malodorous urine    PLAN:  GC, chlamydia, trichomonas, BVAG, CVAG probe and urine culture sent to lab. Treatment: Will treat for UTI. Probe-To be determined once lab results are received ROV prn if symptoms persist or worsen.

## 2021-09-09 LAB — CERVICOVAGINAL ANCILLARY ONLY
Bacterial Vaginitis (gardnerella): POSITIVE — AB
Candida Glabrata: NEGATIVE
Candida Vaginitis: POSITIVE — AB
Chlamydia: NEGATIVE
Comment: NEGATIVE
Comment: NEGATIVE
Comment: NEGATIVE
Comment: NEGATIVE
Comment: NEGATIVE
Comment: NORMAL
Neisseria Gonorrhea: NEGATIVE
Trichomonas: NEGATIVE

## 2021-09-09 MED ORDER — METRONIDAZOLE 500 MG PO TABS: 500.0000 mg | ORAL_TABLET | Freq: Two times a day (BID) | ORAL | 0 refills | Status: DC

## 2021-09-09 MED ORDER — FLUCONAZOLE 150 MG PO TABS: 150.0000 mg | ORAL_TABLET | Freq: Once | ORAL | 2 refills | Status: AC

## 2021-09-09 NOTE — Progress Notes (Signed)
Patient seen and assessed by nursing staff.  Agree with documentation and plan.

## 2021-09-09 NOTE — Addendum Note (Signed)
Addended by: Donnamae Jude on: 09/09/2021 01:48 PM   Modules accepted: Orders

## 2021-09-13 LAB — URINE CULTURE

## 2021-09-14 ENCOUNTER — Ambulatory Visit: Payer: Medicaid Other | Admitting: Family Medicine
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
## 2021-10-27 ENCOUNTER — Ambulatory Visit
Admission: RE | Admit: 2021-10-27 | Discharge: 2021-10-27 | Disposition: A | Discharge status: Home / Self Care | Payer: Medicaid Other | Source: Ambulatory Visit | Attending: Emergency Medicine | Admitting: Emergency Medicine

## 2021-10-27 ENCOUNTER — Other Ambulatory Visit: Payer: Self-pay | Admitting: Family Medicine

## 2021-10-27 ENCOUNTER — Other Ambulatory Visit: Payer: Self-pay

## 2021-10-27 VITALS — BP 131/103 | HR 65 | Temp 97.7°F | Resp 16

## 2021-10-27 DIAGNOSIS — I10 Essential (primary) hypertension: Secondary | ICD-10-CM

## 2021-10-27 DIAGNOSIS — B9689 Other specified bacterial agents as the cause of diseases classified elsewhere: Secondary | ICD-10-CM | POA: Diagnosis not present

## 2021-10-27 DIAGNOSIS — N760 Acute vaginitis: Secondary | ICD-10-CM

## 2021-10-27 LAB — WET PREP, GENITAL
Trich, Wet Prep: NONE SEEN
Yeast Wet Prep HPF POC: NONE SEEN

## 2021-10-27 LAB — URINALYSIS, COMPLETE (UACMP) WITH MICROSCOPIC
Bilirubin Urine: NEGATIVE
Glucose, UA: NEGATIVE mg/dL
Hgb urine dipstick: NEGATIVE
Ketones, ur: NEGATIVE mg/dL
Leukocytes,Ua: NEGATIVE
Nitrite: NEGATIVE
Protein, ur: NEGATIVE mg/dL
Specific Gravity, Urine: 1.025 (ref 1.005–1.030)
pH: 7 (ref 5.0–8.0)

## 2021-10-27 LAB — CHLAMYDIA/NGC RT PCR (ARMC ONLY)
Chlamydia Tr: NOT DETECTED
N gonorrhoeae: NOT DETECTED

## 2021-10-27 MED ORDER — METRONIDAZOLE 500 MG PO TABS: 500.0000 mg | ORAL_TABLET | Freq: Two times a day (BID) | ORAL | 0 refills | Status: DC

## 2021-10-27 NOTE — ED Provider Notes (Signed)
[facility]    CSN: [id number] Arrival date & time: 10/27/21  1757      History   Chief Complaint Chief Complaint  Patient presents with   Dysuria    HPI DEMITRIA HAY is a 45 y.o. female.   Patient presents with vaginal odor described as right knee aches and urinary frequency for 4 days.  Denies urgency, dysuria, hematuria, abdominal pain, flank pain, vaginal discharge, vaginal itching or irritation, new rash or lesions.  Sexually active.   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 medically.   Cough due to ACE inhibitor    Dizziness    Edema leg    Elevated troponin I level 12/31/2015   Endometriosis    Hypertension    a. echo 03/2012 EF 60-65%, moderate LVH, nl LV diastolic fxn, nl PASP; b. 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   Cervical dysplasia 11/24/2019   Hypertension due to endocrine disorder 12/10/2018   Pre-diabetes 09/27/2018   Prolapsed internal hemorrhoids, grade 2 08/28/2018   History of endometriosis 04/10/2018   GAD (generalized anxiety disorder) 04/10/2018   Mild recurrent major depression ([place]) 04/10/2018   Obesity (BMI 30.0-34.9) 04/10/2018   ADHD (attention deficit hyperactivity disorder), 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   Family history of malignant neoplasm of  gastrointestinal tract    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   Allergic rhinitis 02/04/2016   Bilateral leg edema 04/09/2012    Past Surgical History:  Procedure Laterality Date   COLONOSCOPY WITH PROPOFOL N/A 04/09/2017   Procedure: COLONOSCOPY WITH PROPOFOL;  Surgeon: Lucilla Lame, MD;  Location: [facility];  Service: Endoscopy;  Laterality: N/A;  LATEX sensitivity   IR GENERIC HISTORICAL  07/07/2016   IR VENOGRAM RENAL UNI LEFT 07/07/2016 Aletta Edouard, MD [facility]   IR GENERIC HISTORICAL  07/07/2016   IR VENOUS SAMPLING 07/07/2016 Aletta Edouard, MD [facility]   IR GENERIC HISTORICAL  07/07/2016   IR VENOGRAM ADRENAL BI 07/07/2016 Aletta Edouard, MD [facility]   IR GENERIC HISTORICAL  07/07/2016   IR US GUIDE VASC ACCESS RIGHT 07/07/2016 Aletta Edouard, MD [facility]   IR GENERIC HISTORICAL  07/07/2016   IR ANGIOGRAM SELECTIVE EACH ADDITIONAL VESSEL 07/07/2016 Aletta Edouard, MD [facility]   IR GENERIC HISTORICAL  07/07/2016   IR VENOUS SAMPLING 07/07/2016 Aletta Edouard, MD [facility]   IR GENERIC HISTORICAL  07/07/2016   IR US GUIDE VASC ACCESS RIGHT 07/07/2016 Aletta Edouard, MD [facility]   LAPAROSCOPIC SUPRACERVICAL HYSTERECTOMY  09/27/2010   For endometriosis. Ovaries and tubes are in place.   NASAL SEPTUM SURGERY     ROBOTIC ADRENALECTOMY Left 10/27/2016   Procedure: XI ROBOTIC LEFT ADRENALECTOMY;  Surgeon: Michael Boston, MD;  Location: [facility];  Service: General;  Laterality: Left;   TONSILLECTOMY  TUBAL LIGATION  2006   WISDOM TOOTH EXTRACTION      OB History     Gravida  2   Para  2   Term  2   Preterm      AB      Living  2      SAB      IAB      Ectopic      Multiple      Live Births  2            Home Medications    Prior to Admission medications   Medication Sig Start Date End Date Taking? Authorizing Provider  spironolactone-hydrochlorothiazide (ALDACTAZIDE) 25-25 MG tablet TAKE 1 TABLET BY MOUTH DAILY 10/27/21    Steele Sizer, MD  amoxicillin-clavulanate (AUGMENTIN) 875-125 MG tablet Take 1 tablet by mouth 2 (two) times daily. 08/15/21   Sharion Balloon, FNP  Daridorexant HCl (QUVIVIQ) 50 MG TABS Take 1 tablet by mouth at bedtime. 06/14/21   Steele Sizer, MD  escitalopram (LEXAPRO) 10 MG tablet Take 1 tablet (10 mg total) by mouth daily. 06/14/21   Steele Sizer, MD  fluticasone (FLONASE) 50 MCG/ACT nasal spray Place 2 sprays into both nostrils daily. 12/07/20   Steele Sizer, MD  levocetirizine (XYZAL) 5 MG tablet Take 1 tablet (5 mg total) by mouth every evening. 06/15/20   Steele Sizer, MD  methylphenidate 27 MG PO CR tablet Take 1 tablet (27 mg total) by mouth every morning. 06/14/21   Steele Sizer, MD  metoprolol succinate (TOPROL-XL) 50 MG 24 hr tablet Take with or immediately following a meal. 06/14/21   Ancil Boozer, Drue Stager, MD  metroNIDAZOLE (FLAGYL) 500 MG tablet Take 1 tablet (500 mg total) by mouth 2 (two) times daily. 09/09/21   Donnamae Jude, MD  nitrofurantoin, macrocrystal-monohydrate, (MACROBID) 100 MG capsule Take 1 capsule (100 mg total) by mouth 2 (two) times daily. 09/08/21   Donnamae Jude, MD  lisinopril-hydrochlorothiazide (PRINZIDE) 20-12.5 MG per tablet Take 1 tablet by mouth daily. 07/05/11 03/05/12  Martinique, Peter M, MD  torsemide (DEMADEX) 10 MG tablet Take 10 mg by mouth daily.    03/05/12  [provider]    Family History Family History  Problem Relation Age of Onset   COPD Mother    Hypotension Mother    Colon cancer Father    Emphysema Father    Cancer Father        colon   Hyperlipidemia Sister    Heart failure Paternal Uncle    Heart attack Cousin 39   Heart disease Cousin        heart attack   Breast cancer Maternal Aunt    Adrenal disorder Neg Hx     Social History Social History   Tobacco Use   Smoking status: Never   Smokeless tobacco: Never  Vaping Use   Vaping Use: Never used  Substance Use Topics   Alcohol use: Yes    Comment:  social (1 drink/mo)   Drug use: No     Allergies   Ace inhibitors, Latex, and Pollen extract   Review of Systems Review of Systems  Constitutional: Negative.   Gastrointestinal: Negative.   Genitourinary:  Positive for frequency. Negative for decreased urine volume, difficulty urinating, dyspareunia, dysuria, enuresis, flank pain, genital sores, hematuria, menstrual problem, pelvic pain, urgency, vaginal bleeding, vaginal discharge and vaginal pain.  Skin: Negative.     Physical Exam Triage Vital Signs ED Triage Vitals  Enc Vitals Group  BP 10/27/21 1842 (!) 131/103     Pulse Rate 10/27/21 1842 65     Resp 10/27/21 1842 16     Temp 10/27/21 1842 97.7 F (36.5 C)     Temp Source 10/27/21 1842 Oral     SpO2 10/27/21 1842 99 %     Weight --      Height --      Head Circumference --      Peak Flow --      Pain Score 10/27/21 1840 0     Pain Loc --      Pain Edu? --      Excl. in [place]? --    No data found.  Updated Vital Signs BP (!) 131/103 (BP Location: Right Arm)   Pulse 65   Temp 97.7 F (36.5 C) (Oral)   Resp 16   SpO2 99%   Visual Acuity Right Eye Distance:   Left Eye Distance:   Bilateral Distance:    Right Eye Near:   Left Eye Near:    Bilateral Near:     Physical Exam Eyes:     Extraocular Movements: Extraocular movements intact.  Pulmonary:     Effort: Pulmonary effort is normal.  Genitourinary:    Comments: Deferred self collect vaginal swab Skin:    General: Skin is warm and dry.  Neurological:     Mental Status: She is oriented to person, place, and time. Mental status is at baseline.  Psychiatric:        Mood and Affect: Mood normal.        Behavior: Behavior normal.     UC Treatments / Results  Labs (all labs ordered are listed, but only abnormal results are displayed) Labs Reviewed  CHLAMYDIA/NGC RT PCR ([facility] ONLY)            WET PREP, GENITAL  URINALYSIS, COMPLETE (UACMP) WITH MICROSCOPIC    EKG   Radiology No results  found.  Procedures Procedures (including critical care time)  Medications Ordered in UC Medications - No data to display  Initial Impression / Assessment and Plan / UC Course  I have reviewed the triage vital signs and the nursing notes.  Pertinent labs & imaging results that were available during my care of the patient were reviewed by me and considered in my medical decision making (see chart for details).  Bacterial vaginosis  Urinalsysis showing bacteria, most likely related to vaginal infection Wet prep showing clue cells, negative for yeast and trichomoniasis, discussed findings with patient Metronidazole 500 mg twice daily for 7 days advised avoidance of alcohol while using medication Patient endorses that she gets bacterial vaginosis infections frequently, advised using boric acid suppositories prophylactically 5.  Urgent care follow-up as needed  Final Clinical Impressions(s) / UC Diagnoses   Final diagnoses:  None   Discharge Instructions   None    ED Prescriptions   None    PDMP not reviewed this encounter.   Hans Eden, NP 10/27/21 [phone]

## 2021-10-27 NOTE — Discharge Instructions (Addendum)
Your urinalysis showed bacteria but this is related to your vaginal infection  Your wet prep showed clue cells which indicate bacterial vaginosis, but was negative for yeast or trichomoniasis  Take metronidazole twice a day for 7 days, do not drink alcohol while using as this will make you sick   May attempt use of boric acid suppositories after menses and sexual encounters to try to help prevent occurrence   May follow up at urgent care as needed

## 2021-10-27 NOTE — ED Triage Notes (Signed)
Pt c/o of "my urine smells like rotten eggs" x 4 days. +urine frequency. Denies vaginal discharge. Would like urine test and STI testing.
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
## 2021-11-22 ENCOUNTER — Other Ambulatory Visit: Payer: Self-pay | Admitting: Obstetrics & Gynecology

## 2021-11-22 ENCOUNTER — Other Ambulatory Visit: Payer: Self-pay

## 2021-11-22 ENCOUNTER — Encounter: Payer: Self-pay | Admitting: Family Medicine

## 2021-11-22 ENCOUNTER — Ambulatory Visit (INDEPENDENT_AMBULATORY_CARE_PROVIDER_SITE_OTHER): Payer: Medicaid Other | Admitting: Obstetrics & Gynecology

## 2021-11-22 ENCOUNTER — Encounter: Payer: Self-pay | Admitting: Obstetrics & Gynecology

## 2021-11-22 ENCOUNTER — Other Ambulatory Visit (HOSPITAL_COMMUNITY)
Admission: RE | Admit: 2021-11-22 | Discharge: 2021-11-22 | Disposition: A | Discharge status: Home / Self Care | Payer: Medicaid Other | Source: Ambulatory Visit | Attending: Obstetrics & Gynecology | Admitting: Obstetrics & Gynecology

## 2021-11-22 ENCOUNTER — Ambulatory Visit: Payer: Medicaid Other | Admitting: Family Medicine

## 2021-11-22 VITALS — BP 147/93 | HR 73 | Wt 208.0 lb

## 2021-11-22 DIAGNOSIS — Z113 Encounter for screening for infections with a predominantly sexual mode of transmission: Secondary | ICD-10-CM | POA: Insufficient documentation

## 2021-11-22 DIAGNOSIS — Z1231 Encounter for screening mammogram for malignant neoplasm of breast: Secondary | ICD-10-CM

## 2021-11-22 DIAGNOSIS — G4700 Insomnia, unspecified: Secondary | ICD-10-CM

## 2021-11-22 DIAGNOSIS — F411 Generalized anxiety disorder: Secondary | ICD-10-CM

## 2021-11-22 DIAGNOSIS — N760 Acute vaginitis: Secondary | ICD-10-CM | POA: Insufficient documentation

## 2021-11-22 DIAGNOSIS — F339 Major depressive disorder, recurrent, unspecified: Secondary | ICD-10-CM

## 2021-11-22 DIAGNOSIS — Z01419 Encounter for gynecological examination (general) (routine) without abnormal findings: Secondary | ICD-10-CM | POA: Insufficient documentation

## 2021-11-22 DIAGNOSIS — F900 Attention-deficit hyperactivity disorder, predominantly inattentive type: Secondary | ICD-10-CM

## 2021-11-22 DIAGNOSIS — Z1211 Encounter for screening for malignant neoplasm of colon: Secondary | ICD-10-CM | POA: Diagnosis not present

## 2021-11-22 DIAGNOSIS — R002 Palpitations: Secondary | ICD-10-CM | POA: Diagnosis not present

## 2021-11-22 DIAGNOSIS — Z90711 Acquired absence of uterus with remaining cervical stump: Secondary | ICD-10-CM | POA: Insufficient documentation

## 2021-11-22 DIAGNOSIS — I10 Essential (primary) hypertension: Secondary | ICD-10-CM

## 2021-11-22 LAB — HEMOGLOBIN A1C
Est. average glucose Bld gHb Est-mCnc: 126 mg/dL
Hgb A1c MFr Bld: 6 % — ABNORMAL HIGH (ref 4.8–5.6)

## 2021-11-22 LAB — CBC
Hematocrit: 46.4 % (ref 34.0–46.6)
Hemoglobin: 14.9 g/dL (ref 11.1–15.9)
MCH: 26.2 pg — ABNORMAL LOW (ref 26.6–33.0)
MCHC: 32.1 g/dL (ref 31.5–35.7)
MCV: 82 fL (ref 79–97)
Platelets: 292 10*3/uL (ref 150–450)
RBC: 5.69 x10E6/uL — ABNORMAL HIGH (ref 3.77–5.28)
RDW: 14.3 % (ref 11.7–15.4)
WBC: 5 10*3/uL (ref 3.4–10.8)

## 2021-11-22 MED ORDER — METOPROLOL SUCCINATE ER 50 MG PO TB24: ORAL_TABLET | ORAL | 1 refills | Status: DC

## 2021-11-22 MED ORDER — SPIRONOLACTONE-HCTZ 25-25 MG PO TABS: 1.0000 | ORAL_TABLET | Freq: Every day | ORAL | 0 refills | Status: DC

## 2021-11-22 MED ORDER — METHYLPHENIDATE HCL ER (OSM) 27 MG PO TBCR: 27.0000 mg | EXTENDED_RELEASE_TABLET | ORAL | 0 refills | Status: DC

## 2021-11-22 MED ORDER — QUVIVIQ 50 MG PO TABS: 1.0000 | ORAL_TABLET | Freq: Every day | ORAL | 0 refills | Status: DC

## 2021-11-22 MED ORDER — ESCITALOPRAM OXALATE 10 MG PO TABS: 10.0000 mg | ORAL_TABLET | Freq: Every day | ORAL | 1 refills | Status: DC

## 2021-11-22 NOTE — Progress Notes (Signed)
GYNECOLOGY ANNUAL PREVENTATIVE CARE ENCOUNTER NOTE  History:     Amanda Fields is a 45 y.o. [id number] female with history of laparoscopic supracervical hysterectomy for endometriosis here for a routine annual gynecologic exam.  Current complaints: recurrent vaginitis; had three positive cultures for BV in the last year.  Currently has symptomatic vaginal discharge.  Denies abnormal vaginal bleeding,  pelvic pain, problems with intercourse or other gynecologic concerns.    Gynecologic History No LMP recorded. Patient has had a hysterectomy. Last Pap: 12/09/2020. Result was normal with positive HPV (negative 16, 18/45) Last Mammogram: 02/12/2021.  Result was normal Last Colonoscopy: 04/09/2017.  Result was normal  Obstetric History OB History  Gravida Para Term Preterm AB Living  2 2 2     2   SAB IAB Ectopic Multiple Live Births          2    # Outcome Date GA Lbr Len/2nd Weight Sex Delivery Anes PTL Lv  2 Term 12/18/04    M Vag-Spont   LIV  1 Term 10/30/95    F Vag-Spont   LIV    Past Medical History:  Diagnosis Date   ADHD (attention deficit hyperactivity disorder), 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   Adrenal mass, left (HCC)    Anemia    none since Hysterectomy done-related to heavy menses   Asthma    Related to allergies"wheezes with pollen exposure" no asthma attacks   Breast lipoma 09/06/2011   resolved- no surgery-tx. medically.   Cough due to ACE inhibitor    Dizziness    Edema leg    Elevated troponin I level 12/31/2015   Endometriosis    Hypertension    a. echo 03/2012 EF 60-65%, moderate LVH, nl LV diastolic fxn, nl PASP; b. echo 12/2015: EF 55-60%   Hypertensive urgency 12/31/2015   Migraine headache    migrianes-less frequent now   Obesity    Palpitation     Past Surgical History:  Procedure Laterality Date   COLONOSCOPY WITH PROPOFOL N/A 04/09/2017   Procedure: COLONOSCOPY WITH PROPOFOL;  Surgeon: Lucilla Lame, MD;  Location: [facility];  Service:  Endoscopy;  Laterality: N/A;  LATEX sensitivity   IR GENERIC HISTORICAL  07/07/2016   IR VENOGRAM RENAL UNI LEFT 07/07/2016 Aletta Edouard, MD [facility]   IR GENERIC HISTORICAL  07/07/2016   IR VENOUS SAMPLING 07/07/2016 Aletta Edouard, MD [facility]   IR GENERIC HISTORICAL  07/07/2016   IR VENOGRAM ADRENAL BI 07/07/2016 Aletta Edouard, MD [facility]   IR GENERIC HISTORICAL  07/07/2016   IR US GUIDE VASC ACCESS RIGHT 07/07/2016 Aletta Edouard, MD [facility]   IR GENERIC HISTORICAL  07/07/2016   IR ANGIOGRAM SELECTIVE EACH ADDITIONAL VESSEL 07/07/2016 Aletta Edouard, MD [facility]   IR GENERIC HISTORICAL  07/07/2016   IR VENOUS SAMPLING 07/07/2016 Aletta Edouard, MD [facility]   IR GENERIC HISTORICAL  07/07/2016   IR US GUIDE VASC ACCESS RIGHT 07/07/2016 Aletta Edouard, MD [facility]   LAPAROSCOPIC SUPRACERVICAL HYSTERECTOMY  09/27/2010   For endometriosis. Ovaries and tubes are in place.   NASAL SEPTUM SURGERY     ROBOTIC ADRENALECTOMY Left 10/27/2016   Procedure: XI ROBOTIC LEFT ADRENALECTOMY;  Surgeon: Michael Boston, MD;  Location: [facility];  Service: General;  Laterality: Left;   TONSILLECTOMY     TUBAL LIGATION  2006   WISDOM TOOTH EXTRACTION      Current Outpatient Medications on File Prior to Visit  Medication Sig Dispense Refill   spironolactone-hydrochlorothiazide (  ALDACTAZIDE) 25-25 MG tablet TAKE 1 TABLET BY MOUTH DAILY 90 tablet 0   Daridorexant HCl (QUVIVIQ) 50 MG TABS Take 1 tablet by mouth at bedtime. 30 tablet 0   escitalopram (LEXAPRO) 10 MG tablet Take 1 tablet (10 mg total) by mouth daily. 90 tablet 1   fluticasone (FLONASE) 50 MCG/ACT nasal spray Place 2 sprays into both nostrils daily. 48 g 1   levocetirizine (XYZAL) 5 MG tablet Take 1 tablet (5 mg total) by mouth every evening. 90 tablet 1   methylphenidate 27 MG PO CR tablet Take 1 tablet (27 mg total) by mouth every morning. 30 tablet 0   metoprolol succinate (TOPROL-XL) 50 MG 24 hr tablet Take with or  immediately following a meal. 90 tablet 1   metroNIDAZOLE (FLAGYL) 500 MG tablet Take 1 tablet (500 mg total) by mouth 2 (two) times daily. 14 tablet 0   [DISCONTINUED] lisinopril-hydrochlorothiazide (PRINZIDE) 20-12.5 MG per tablet Take 1 tablet by mouth daily. 30 tablet 6   [DISCONTINUED] torsemide (DEMADEX) 10 MG tablet Take 10 mg by mouth daily.       No current facility-administered medications on file prior to visit.    Allergies  Allergen Reactions   Ace Inhibitors Cough   Latex Itching    Gloves, condoms   Pollen Extract     Social History:  reports that she has never smoked. She has never used smokeless tobacco. She reports current alcohol use. She reports that she does not use drugs.  Family History  Problem Relation Age of Onset   COPD Mother    Hypotension Mother    Colon cancer Father    Emphysema Father    Cancer Father        colon   Hyperlipidemia Sister    Heart failure Paternal Uncle    Heart attack Cousin 6   Heart disease Cousin        heart attack   Breast cancer Maternal Aunt    Adrenal disorder Neg Hx     The following portions of the patient's history were reviewed and updated as appropriate: allergies, current medications, past family history, past medical history, past social history, past surgical history and problem list.  Review of Systems Pertinent items noted in HPI and remainder of comprehensive ROS otherwise negative.  Physical Exam:  BP (!) 147/93   Pulse 73   Wt 208 lb (94.3 kg)   BMI 33.57 kg/m  CONSTITUTIONAL: Well-developed, well-nourished female in no acute distress.  HENT:  Normocephalic, atraumatic, External right and left ear normal.  EYES: Conjunctivae and EOM are normal. Pupils are equal, round, and reactive to light. No scleral icterus.  NECK: Normal range of motion, supple, no masses.  Normal thyroid.  SKIN: Skin is warm and dry. No rash noted. Not diaphoretic. No erythema. No pallor. MUSCULOSKELETAL: Normal range of  motion. No tenderness.  No cyanosis, clubbing, or edema. NEUROLOGIC: Alert and oriented to person, place, and time. Normal reflexes, muscle tone coordination.  PSYCHIATRIC: Normal mood and affect. Normal behavior. Normal judgment and thought content. CARDIOVASCULAR: Normal heart rate noted, regular rhythm RESPIRATORY: Clear to auscultation bilaterally. Effort and breath sounds normal, no problems with respiration noted. BREASTS: Symmetric in size. No masses, tenderness, skin changes, nipple drainage, or lymphadenopathy bilaterally. Performed in the presence of a chaperone. ABDOMEN: Soft, no distention noted.  No tenderness, rebound or guarding.  PELVIC: Normal appearing external genitalia and urethral meatus; normal appearing vaginal mucosa and cervix.  Copious white vaginal discharge noted, testing  sample obtained.  Pap smear obtained.  No palpable masses, no tenderness on bimanual exam.  Performed in the presence of a chaperone.   Assessment and Plan:    1. Screening for colon cancer Due for repeat colonoscopy in 03/2022, father had colon cancer. She is hesitant to repeat colonoscopy. This was encouraged due to her family history but also ordered Cologuard as a second choice. Emphasized that colonoscopy id the preferred, gold standard screening choice.  - Cologuard  2. Recurrent vaginitis - Cervicovaginal ancillary only done today, will follow up results and manage accordingly. If recurrent vaginitis is seen, may need prolonged metronidazole vaginal therapy or boric acid therapy.  3. Routine screening for STI (sexually transmitted infection) STI screen done, will follow up results and manage accordingly. - Cervicovaginal ancillary only - RPR+HBsAg+HCVAb+...  4. Well woman exam with routine gynecological exam She desires preventative healthcare maintenance labs. Pap also done.  will follow up results and manage accordingly. - CBC - Comprehensive metabolic panel - Lipid panel - Hemoglobin  A1c - TSH - Pap smear with HPV testing  Routine preventative health maintenance measures emphasized. Please refer to After Visit Summary for other counseling recommendations.      Verita Schneiders, MD, [place] for [employer], [place]

## 2021-11-23 ENCOUNTER — Encounter: Payer: Self-pay | Admitting: Obstetrics & Gynecology

## 2021-11-23 LAB — COMPREHENSIVE METABOLIC PANEL
ALT: 31 IU/L (ref 0–32)
AST: 46 IU/L — ABNORMAL HIGH (ref 0–40)
Albumin/Globulin Ratio: 1.4 (ref 1.2–2.2)
Albumin: 4.6 g/dL (ref 3.8–4.8)
Alkaline Phosphatase: 50 IU/L (ref 44–121)
BUN/Creatinine Ratio: 10 (ref 9–23)
BUN: 11 mg/dL (ref 6–24)
Bilirubin Total: 0.6 mg/dL (ref 0.0–1.2)
CO2: 27 mmol/L (ref 20–29)
Calcium: 9.8 mg/dL (ref 8.7–10.2)
Chloride: 99 mmol/L (ref 96–106)
Creatinine, Ser: 1.11 mg/dL — ABNORMAL HIGH (ref 0.57–1.00)
Globulin, Total: 3.2 g/dL (ref 1.5–4.5)
Glucose: 92 mg/dL (ref 70–99)
Potassium: 4.3 mmol/L (ref 3.5–5.2)
Sodium: 143 mmol/L (ref 134–144)
Total Protein: 7.8 g/dL (ref 6.0–8.5)
eGFR: 62 mL/min/{1.73_m2} (ref >59)

## 2021-11-23 LAB — LIPID PANEL
Chol/HDL Ratio: 4.6 ratio — ABNORMAL HIGH (ref 0.0–4.4)
Cholesterol, Total: 216 mg/dL — ABNORMAL HIGH (ref 100–199)
HDL: 47 mg/dL (ref >39)
LDL Chol Calc (NIH): 154 mg/dL — ABNORMAL HIGH (ref 0–99)
Triglycerides: 85 mg/dL (ref 0–149)
VLDL Cholesterol Cal: 15 mg/dL (ref 5–40)

## 2021-11-23 LAB — RPR+HBSAG+HCVAB+...
HIV Screen 4th Generation wRfx: NONREACTIVE
Hep C Virus Ab: 0.2 s/co ratio (ref 0.0–0.9)
Hepatitis B Surface Ag: NEGATIVE
RPR Ser Ql: NONREACTIVE

## 2021-11-23 LAB — CERVICOVAGINAL ANCILLARY ONLY
Bacterial Vaginitis (gardnerella): POSITIVE — AB
Candida Glabrata: NEGATIVE
Candida Vaginitis: NEGATIVE
Chlamydia: NEGATIVE
Comment: NEGATIVE
Comment: NEGATIVE
Comment: NEGATIVE
Comment: NEGATIVE
Comment: NEGATIVE
Comment: NORMAL
Neisseria Gonorrhea: NEGATIVE
Trichomonas: NEGATIVE

## 2021-11-23 LAB — TSH: TSH: 1.35 u[IU]/mL (ref 0.450–4.500)

## 2021-11-23 MED ORDER — METRONIDAZOLE 0.75 % VA GEL: 1.0000 | Freq: Every day | VAGINAL | 5 refills | Status: DC

## 2021-11-23 NOTE — Addendum Note (Signed)
Addended by: Verita Schneiders A on: 11/23/2021 04:41 PM   Modules accepted: Orders

## 2021-11-28 ENCOUNTER — Telehealth: Payer: Self-pay

## 2021-11-28 ENCOUNTER — Encounter: Payer: Self-pay | Admitting: Obstetrics & Gynecology

## 2021-11-28 DIAGNOSIS — R87610 Atypical squamous cells of undetermined significance on cytologic smear of cervix (ASC-US): Secondary | ICD-10-CM | POA: Insufficient documentation

## 2021-11-28 DIAGNOSIS — R87810 Cervical high risk human papillomavirus (HPV) DNA test positive: Secondary | ICD-10-CM | POA: Insufficient documentation

## 2021-11-28 LAB — CYTOLOGY - PAP
Comment: NEGATIVE
Comment: NEGATIVE
Diagnosis: UNDETERMINED — AB
HPV 16: NEGATIVE
HPV 18 / 45: NEGATIVE
High risk HPV: POSITIVE — AB

## 2021-11-28 NOTE — Telephone Encounter (Signed)
Patient made aware of abnormal pap results  Pt voiced understanding.  Pt info given to scheduling to make appt for Colpo.

## 2021-11-28 NOTE — Telephone Encounter (Signed)
-----   Message from Osborne Oman, MD sent at 11/28/2021 11:32 AM EST ----- Please schedule patient for colposcopy for ASCUS +HRHPV pap done on 11/22/2021. History of abnormal pap (LGSIL +HPV in 2021, colposcopy showed CIN I).  Please call to inform patient of results and need for appointment.

## 2021-12-08 ENCOUNTER — Other Ambulatory Visit: Payer: Self-pay | Admitting: Family Medicine

## 2021-12-08 DIAGNOSIS — N951 Menopausal and female climacteric states: Secondary | ICD-10-CM

## 2021-12-08 DIAGNOSIS — F5104 Psychophysiologic insomnia: Secondary | ICD-10-CM

## 2021-12-08 NOTE — Telephone Encounter (Signed)
Requested medications are due for refill today.  no  Requested medications are on the active medications list.  no  Last refill. 12/07/2020  Future visit scheduled.   yes  Notes to clinic.  This medication was discontinued by Dr. Ancil Boozer 03/22/2021.    Requested Prescriptions  Pending Prescriptions Disp Refills   cloNIDine (CATAPRES) 0.1 MG tablet [Pharmacy Med Name: CLONIDINE 0.1MG TABLETS] 90 tablet 1    Sig: TAKE 1 TABLET(0.1 MG) BY MOUTH DAILY     Cardiovascular:  Alpha-2 Agonists Passed - 12/08/2021  6:20 AM      Passed - Last BP in normal range    BP Readings from Last 1 Encounters:  11/22/21 132/84          Passed - Last Heart Rate in normal range    Pulse Readings from Last 1 Encounters:  11/22/21 71          Passed - Valid encounter within last 6 months    Recent Outpatient Visits           2 weeks ago Insomnia, persistent   [facility], Drue Stager, MD   4 months ago HTN (hypertension), benign   [facility], Drue Stager, MD   5 months ago ADHD (attention deficit hyperactivity disorder), inattentive type   [facility] Steele Sizer, MD   8 months ago Migraine without aura and with status migrainosus, not intractable   [facility] Steele Sizer, MD   1 year ago Major depression, recurrent, chronic [facility])   [facility] Steele Sizer, MD       Future Appointments             In 2 months Ancil Boozer, Drue Stager, MD [facility], [facility]

## 2021-12-13 LAB — COLOGUARD

## 2021-12-25 DIAGNOSIS — Z1211 Encounter for screening for malignant neoplasm of colon: Secondary | ICD-10-CM | POA: Diagnosis not present

## 2022-01-01 LAB — COLOGUARD: COLOGUARD: NEGATIVE

## 2022-01-10 ENCOUNTER — Other Ambulatory Visit: Payer: Self-pay | Admitting: Obstetrics & Gynecology

## 2022-01-10 ENCOUNTER — Ambulatory Visit (INDEPENDENT_AMBULATORY_CARE_PROVIDER_SITE_OTHER): Payer: Medicaid Other | Admitting: Obstetrics & Gynecology

## 2022-01-10 ENCOUNTER — Other Ambulatory Visit: Payer: Self-pay

## 2022-01-10 ENCOUNTER — Encounter: Payer: Self-pay | Admitting: Obstetrics & Gynecology

## 2022-01-10 VITALS — BP 148/92 | HR 73

## 2022-01-10 DIAGNOSIS — R87810 Cervical high risk human papillomavirus (HPV) DNA test positive: Secondary | ICD-10-CM | POA: Diagnosis not present

## 2022-01-10 DIAGNOSIS — R87610 Atypical squamous cells of undetermined significance on cytologic smear of cervix (ASC-US): Secondary | ICD-10-CM

## 2022-01-10 DIAGNOSIS — N879 Dysplasia of cervix uteri, unspecified: Secondary | ICD-10-CM | POA: Diagnosis not present

## 2022-01-10 NOTE — Progress Notes (Signed)
° ° °  GYNECOLOGY OFFICE COLPOSCOPY PROCEDURE NOTE  46 y.o. [id number] here for colposcopy for ASCUS with POSITIVE high risk HPV pap smear on 11/22/2021. Discussed role for HPV in cervical dysplasia, need for surveillance.  Patient gave informed written consent, time out was performed.  Placed in lithotomy position. Cervix viewed with speculum and colposcope after application of acetic acid.   Colposcopy adequate? Yes No visible lesions.  ECC specimen obtained, labeled and sent to pathology.  Chaperone was present during entire procedure.  Patient was given post procedure instructions.  Will follow up pathology and manage accordingly; patient will be contacted with results and recommendations.  Routine preventative health maintenance measures emphasized.    Verita Schneiders, MD, [place] for [employer], [place]

## 2022-01-18 ENCOUNTER — Telehealth: Payer: Self-pay | Admitting: *Deleted

## 2022-01-18 NOTE — Telephone Encounter (Signed)
-----   Message from Osborne Oman, MD sent at 01/16/2022 12:48 PM EST ----- 01/10/2022 Colposcopy Pathology Diagnosis after ASCUS +HRHPV pap on 11/22/2021 Endocervix, curettage - DETACHED FRAGMENTS OF DYSPLASTIC SQUAMOUS MUCOSA. SEE COMMENT. - FRAGMENTS OF BENIGN ENDOCERVICAL MUCOSA. Microscopic Comment Several of the fragments have an appearance suggestive of a high grade lesion, but evaluation is limited due to tissue orientation and fragmentation.  Given concern about possible high grade lesion and history of CIN I in 2021, cryotherapy or LEEP can be recommended.  The alternative will be to repeat pap smear around June 2023. I attempted to call patient, there was no answer.  Patient will be contacted at another time and given recommendations.  Verita Schneiders, MD

## 2022-01-18 NOTE — Telephone Encounter (Signed)
Called pt to discuss colpo results and recommendations from Dr A, pt wil go ahead and schedule the LEEP procedure.

## 2022-02-13 ENCOUNTER — Ambulatory Visit: Payer: Medicaid Other

## 2022-02-13 ENCOUNTER — Ambulatory Visit
Admission: RE | Admit: 2022-02-13 | Discharge: 2022-02-13 | Disposition: A | Discharge status: Home / Self Care | Payer: Medicaid Other | Source: Ambulatory Visit | Attending: Obstetrics & Gynecology | Admitting: Obstetrics & Gynecology

## 2022-02-13 ENCOUNTER — Other Ambulatory Visit: Payer: Self-pay

## 2022-02-13 DIAGNOSIS — Z1231 Encounter for screening mammogram for malignant neoplasm of breast: Secondary | ICD-10-CM

## 2022-02-15 ENCOUNTER — Other Ambulatory Visit: Payer: Self-pay | Admitting: Obstetrics & Gynecology

## 2022-02-15 DIAGNOSIS — R928 Other abnormal and inconclusive findings on diagnostic imaging of breast: Secondary | ICD-10-CM

## 2022-02-16 ENCOUNTER — Other Ambulatory Visit: Payer: Self-pay

## 2022-02-16 DIAGNOSIS — R928 Other abnormal and inconclusive findings on diagnostic imaging of breast: Secondary | ICD-10-CM
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
## 2022-02-21 ENCOUNTER — Ambulatory Visit: Payer: Medicaid Other | Admitting: Family Medicine

## 2022-02-21 ENCOUNTER — Encounter: Payer: Self-pay | Admitting: Family Medicine

## 2022-02-21 VITALS — BP 130/82 | HR 88 | Resp 16 | Ht 66.0 in | Wt 207.0 lb

## 2022-02-21 DIAGNOSIS — R7303 Prediabetes: Secondary | ICD-10-CM

## 2022-02-21 DIAGNOSIS — G4700 Insomnia, unspecified: Secondary | ICD-10-CM | POA: Diagnosis not present

## 2022-02-21 DIAGNOSIS — G43001 Migraine without aura, not intractable, with status migrainosus: Secondary | ICD-10-CM

## 2022-02-21 DIAGNOSIS — E559 Vitamin D deficiency, unspecified: Secondary | ICD-10-CM | POA: Diagnosis not present

## 2022-02-21 DIAGNOSIS — Z23 Encounter for immunization: Secondary | ICD-10-CM

## 2022-02-21 DIAGNOSIS — F339 Major depressive disorder, recurrent, unspecified: Secondary | ICD-10-CM | POA: Diagnosis not present

## 2022-02-21 DIAGNOSIS — I10 Essential (primary) hypertension: Secondary | ICD-10-CM | POA: Diagnosis not present

## 2022-02-21 DIAGNOSIS — E896 Postprocedural adrenocortical (-medullary) hypofunction: Secondary | ICD-10-CM | POA: Diagnosis not present

## 2022-02-21 DIAGNOSIS — F411 Generalized anxiety disorder: Secondary | ICD-10-CM | POA: Diagnosis not present

## 2022-02-21 DIAGNOSIS — F900 Attention-deficit hyperactivity disorder, predominantly inattentive type: Secondary | ICD-10-CM | POA: Diagnosis not present

## 2022-02-21 MED ORDER — QUVIVIQ 50 MG PO TABS: 1.0000 | ORAL_TABLET | Freq: Every day | ORAL | 2 refills | Status: DC

## 2022-02-21 MED ORDER — METHYLPHENIDATE HCL ER (OSM) 27 MG PO TBCR: 27.0000 mg | EXTENDED_RELEASE_TABLET | ORAL | 0 refills | Status: DC

## 2022-02-21 MED ORDER — SPIRONOLACTONE-HCTZ 25-25 MG PO TABS: 1.0000 | ORAL_TABLET | Freq: Every day | ORAL | 0 refills | Status: DC

## 2022-02-21 MED ORDER — ARIPIPRAZOLE 2 MG PO TABS: 2.0000 mg | ORAL_TABLET | Freq: Every day | ORAL | 1 refills | Status: DC

## 2022-02-21 MED ORDER — UBRELVY 100 MG PO TABS: 1.0000 | ORAL_TABLET | ORAL | 2 refills | Status: DC

## 2022-03-01 ENCOUNTER — Ambulatory Visit
Admission: RE | Admit: 2022-03-01 | Discharge: 2022-03-01 | Disposition: A | Discharge status: Home / Self Care | Payer: Medicaid Other | Source: Ambulatory Visit | Attending: Emergency Medicine | Admitting: Emergency Medicine

## 2022-03-01 ENCOUNTER — Other Ambulatory Visit: Payer: Self-pay

## 2022-03-01 VITALS — BP 132/104 | HR 71 | Temp 98.4°F | Resp 16 | Ht 66.0 in | Wt 207.0 lb

## 2022-03-01 DIAGNOSIS — B9689 Other specified bacterial agents as the cause of diseases classified elsewhere: Secondary | ICD-10-CM | POA: Insufficient documentation

## 2022-03-01 DIAGNOSIS — N760 Acute vaginitis: Secondary | ICD-10-CM | POA: Diagnosis not present

## 2022-03-01 LAB — WET PREP, GENITAL
Sperm: NONE SEEN
Trich, Wet Prep: NONE SEEN
WBC, Wet Prep HPF POC: <10 — AB (ref <10)
Yeast Wet Prep HPF POC: NONE SEEN

## 2022-03-01 MED ORDER — METRONIDAZOLE 500 MG PO TABS: 500.0000 mg | ORAL_TABLET | Freq: Two times a day (BID) | ORAL | 0 refills | Status: DC

## 2022-03-01 NOTE — ED Triage Notes (Signed)
Pt states her partner was cheating and requests to be tested for STDs with blood work. States she does have vaginal itching x 4 days.  ?

## 2022-03-01 NOTE — Discharge Instructions (Signed)
Take the Flagyl (metronidazole) 500 mg twice daily for treatment of your bacterial vaginosis. ? ?Avoid alcohol while on the metronidazole as taken together will cause of vomiting. ? ?Bacterial vaginosis is often caused by a imbalance of bacteria in your vaginal vault.  This is sometimes a result of using tampons or hormonal fluctuations during her menstrual cycle. ? ?You if your symptoms are recurrent you can try using a boric acid suppository twice weekly to help maintain the acid-base balance in your vagina vault which could prevent further infection. ? ?You can also try vaginal probiotics to help return normal bacterial balance.  ?

## 2022-03-01 NOTE — ED Provider Notes (Signed)
?[facility] ? ? ? ?CSN: [id number] ?Arrival date & time: 03/01/22  1902 ? ? ?  ? ?History   ?Chief Complaint ?Chief Complaint  ?Patient presents with  ? Exposure to STD  ? ? ?HPI ?Amanda Fields is a 46 y.o. female.  ? ?HPI ? ?46 year old female here for GYN complaints. ? ?Patient is here requesting STI testing as she discovered that her partner has been unfaithful to her.  She is requesting testing for gonorrhea, chlamydia, trichomonas, syphilis, and HIV.  She is also been experiencing vaginal itching with a white vaginal discharge for the past 4 days.  She denies any vaginal bleeding, pain with urination, urinary urgency or frequency.  No back pain or abdominal pain. ? ?Past Medical History:  ?Diagnosis Date  ? ADHD (attention deficit hyperactivity disorder), 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  ? Adrenal mass, left ([place])   ? Anemia   ? none since Hysterectomy done-related to heavy menses  ? Asthma   ? Related to allergies"wheezes with pollen exposure" no asthma attacks  ? Breast lipoma 09/06/2011  ? resolved- no surgery-tx. medically.  ? Cough due to ACE inhibitor   ? Dizziness   ? Edema leg   ? Elevated troponin I level 12/31/2015  ? Endometriosis   ? Hypertension   ? a. echo 03/2012 EF 60-65%, moderate LVH, nl LV diastolic fxn, nl PASP; b. echo 12/2015: EF 55-60%  ? Hypertensive urgency 12/31/2015  ? Migraine headache   ? migrianes-less frequent now  ? Obesity   ? Palpitation   ? Pre-diabetes 09/27/2018  ? ? ?Patient Active Problem List  ? Diagnosis Date Noted  ? ASCUS with positive high risk HPV cervical on 11/22/2021 11/28/2021  ? Status post laparoscopic supracervical hysterectomy for endometriosis 11/22/2021  ? Acne vulgaris 06/13/2021  ? Other hypertrophic disorders of the skin 06/13/2021  ? Dysplasia of cervix, low grade (CIN 1) 12/15/2020  ? Chronic rhinitis 08/05/2020  ? 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 Cervical dysplasia 11/24/2019  ? Hypertension due to endocrine disorder 12/10/2018  ?  Pre-diabetes 09/27/2018  ? Prolapsed internal hemorrhoids, grade 2 08/28/2018  ? History of endometriosis 04/10/2018  ? GAD (generalized anxiety disorder) 04/10/2018  ? Mild recurrent major depression ([place]) 04/10/2018  ? Obesity (BMI 30.0-34.9) 04/10/2018  ? ADHD (attention deficit hyperactivity disorder), 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  ? Family history of malignant neoplasm of gastrointestinal tract   ? 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 Allergic rhinitis 02/04/2016  ? Bilateral leg edema 04/09/2012  ? ? ?Past Surgical History:  ?Procedure Laterality Date  ? COLONOSCOPY WITH PROPOFOL N/A 04/09/2017  ? Procedure: COLONOSCOPY WITH PROPOFOL;  Surgeon: Lucilla Lame, MD;  Location: [facility];  Service: Endoscopy;  Laterality: N/A;  LATEX sensitivity  ? IR GENERIC HISTORICAL  07/07/2016  ? IR VENOGRAM RENAL UNI LEFT 07/07/2016 Aletta Edouard, MD [facility]  ? IR GENERIC HISTORICAL  07/07/2016  ? IR VENOUS SAMPLING 07/07/2016 Aletta Edouard, MD [facility]  ? IR GENERIC HISTORICAL  07/07/2016  ? IR VENOGRAM ADRENAL BI 07/07/2016 Aletta Edouard, MD [facility]  ? IR GENERIC HISTORICAL  07/07/2016  ? IR US GUIDE VASC ACCESS RIGHT 07/07/2016 Aletta Edouard, MD [facility]  ? IR GENERIC HISTORICAL  07/07/2016  ? IR ANGIOGRAM SELECTIVE EACH ADDITIONAL VESSEL 07/07/2016 Aletta Edouard, MD [facility]  ? IR GENERIC HISTORICAL  07/07/2016  ? IR VENOUS SAMPLING 07/07/2016 Aletta Edouard, MD [facility]  ? IR GENERIC HISTORICAL  07/07/2016  ? IR US GUIDE  VASC ACCESS RIGHT 07/07/2016 Aletta Edouard, MD [facility]  ? LAPAROSCOPIC SUPRACERVICAL HYSTERECTOMY  09/27/2010  ? For endometriosis. Ovaries and tubes are in place.  ? NASAL SEPTUM SURGERY    ? ROBOTIC ADRENALECTOMY Left 10/27/2016  ? Procedure: XI ROBOTIC LEFT ADRENALECTOMY;  Surgeon: Michael Boston, MD;  Location: [facility];  Service: General;  Laterality: Left;  ? TONSILLECTOMY    ? TUBAL LIGATION  2006  ? WISDOM TOOTH EXTRACTION    ? ? ?OB History   ? ? Gravida  ?2  ? Para   ?2  ? Term  ?2  ? Preterm  ?   ? AB  ?   ? Living  ?2  ?  ? ? SAB  ?   ? IAB  ?   ? Ectopic  ?   ? Multiple  ?   ? Live Births  ?2  ?   ?  ?  ? ? ? ?Home Medications   ? ?Prior to Admission medications   ?Medication Sig Start Date End Date Taking? Authorizing Provider  ?metroNIDAZOLE (FLAGYL) 500 MG tablet Take 1 tablet (500 mg total) by mouth 2 (two) times daily. 03/01/22  Yes Margarette Canada, NP  ?ARIPiprazole (ABILIFY) 2 MG tablet Take 1 tablet (2 mg total) by mouth daily. 02/21/22   Steele Sizer, MD  ?Daridorexant HCl (QUVIVIQ) 50 MG TABS Take 1 tablet by mouth at bedtime. 02/21/22   Steele Sizer, MD  ?escitalopram (LEXAPRO) 10 MG tablet Take 1 tablet (10 mg total) by mouth daily. 11/22/21   Steele Sizer, MD  ?fluticasone (FLONASE) 50 MCG/ACT nasal spray Place 2 sprays into both nostrils daily. 12/07/20   Steele Sizer, MD  ?levocetirizine (XYZAL) 5 MG tablet Take 1 tablet (5 mg total) by mouth every evening. 06/15/20   Steele Sizer, MD  ?methylphenidate (CONCERTA) 27 MG PO CR tablet Take 1 tablet (27 mg total) by mouth every morning. 02/21/22   Steele Sizer, MD  ?methylphenidate (CONCERTA) 27 MG PO CR tablet Take 1 tablet (27 mg total) by mouth every morning. 02/21/22   Steele Sizer, MD  ?methylphenidate 27 MG PO CR tablet Take 1 tablet (27 mg total) by mouth every morning. 02/21/22   Steele Sizer, MD  ?metoprolol succinate (TOPROL-XL) 50 MG 24 hr tablet Take with or immediately following a meal. 11/22/21   Sowles, Drue Stager, MD  ?spironolactone-hydrochlorothiazide (ALDACTAZIDE) 25-25 MG tablet Take 1 tablet by mouth daily. 02/21/22   Steele Sizer, MD  ?Ubrogepant (UBRELVY) 100 MG TABS Take 1 tablet by mouth every other day. 02/21/22   Steele Sizer, MD  ? ? ?Family History ?Family History  ?Problem Relation Age of Onset  ? COPD Mother   ? Hypotension Mother   ? Colon cancer Father   ? Emphysema Father   ? Cancer Father   ?     colon  ? Hyperlipidemia Sister   ? Heart failure Paternal Uncle   ? Heart  attack Cousin 32  ? Heart disease Cousin   ?     heart attack  ? Breast cancer Maternal Aunt   ? Adrenal disorder Neg Hx   ? ? ?Social History ?Social History  ? ?Tobacco Use  ? Smoking status: Never  ? Smokeless tobacco: Never  ?Vaping Use  ? Vaping Use: Never used  ?Substance Use Topics  ? Alcohol use: Yes  ?  Comment: social (1 drink/mo)  ? Drug use: No  ? ? ? ?Allergies   ?Ace inhibitors, Latex, and Pollen extract ? ? ?  Review of Systems ?Review of Systems  ?Constitutional:  Negative for fever.  ?Gastrointestinal:  Negative for abdominal pain, nausea and vomiting.  ?Genitourinary:  Positive for vaginal discharge and vaginal pain. Negative for dysuria, frequency, urgency and vaginal bleeding.  ?Skin:  Negative for rash.  ?Hematological: Negative.   ?Psychiatric/Behavioral: Negative.    ? ? ?Physical Exam ?Triage Vital Signs ?ED Triage Vitals  ?Enc Vitals Group  ?   BP 03/01/22 1922 (!) 132/104  ?   Pulse Rate 03/01/22 1922 71  ?   Resp 03/01/22 1922 16  ?   Temp 03/01/22 1922 98.4 ?F (36.9 ?C)  ?   Temp Source 03/01/22 1922 Oral  ?   SpO2 03/01/22 1921 100 %  ?   Weight 03/01/22 1921 207 lb (93.9 kg)  ?   Height 03/01/22 1921 5' 6"  (1.676 m)  ?   Head Circumference --   ?   Peak Flow --   ?   Pain Score 03/01/22 1920 0  ?   Pain Loc --   ?   Pain Edu? --   ?   Excl. in [place]? --   ? ?No data found. ? ?Updated Vital Signs ?BP (!) 132/104 (BP Location: Left Arm)   Pulse 71   Temp 98.4 ?F (36.9 ?C) (Oral)   Resp 16   Ht 5' 6"  (1.676 m)   Wt 207 lb (93.9 kg)   SpO2 98%   BMI 33.41 kg/m?  ? ?Visual Acuity ?Right Eye Distance:   ?Left Eye Distance:   ?Bilateral Distance:   ? ?Right Eye Near:   ?Left Eye Near:    ?Bilateral Near:    ? ?Physical Exam ?Vitals and nursing note reviewed.  ?Constitutional:   ?   Appearance: Normal appearance. She is not ill-appearing.  ?HENT:  ?   Head: Normocephalic and atraumatic.  ?Cardiovascular:  ?   Rate and Rhythm: Normal rate and regular rhythm.  ?   Pulses: Normal pulses.  ?    Heart sounds: Normal heart sounds. No murmur heard. ?  No friction rub. No gallop.  ?Pulmonary:  ?   Effort: Pulmonary effort is normal.  ?   Breath sounds: Normal breath sounds. No wheezing, rhonchi or rales.  ?Abdom

## 2022-03-02 ENCOUNTER — Ambulatory Visit
Admission: RE | Admit: 2022-03-02 | Discharge: 2022-03-02 | Disposition: A | Discharge status: Home / Self Care | Payer: Medicaid Other | Source: Ambulatory Visit | Attending: Obstetrics & Gynecology | Admitting: Obstetrics & Gynecology

## 2022-03-02 DIAGNOSIS — R922 Inconclusive mammogram: Secondary | ICD-10-CM | POA: Diagnosis not present

## 2022-03-02 LAB — RPR: RPR Ser Ql: NONREACTIVE

## 2022-03-02 LAB — HIV ANTIBODY (ROUTINE TESTING W REFLEX): HIV Screen 4th Generation wRfx: NONREACTIVE

## 2022-03-03 LAB — CERVICOVAGINAL ANCILLARY ONLY
Chlamydia: NEGATIVE
Comment: NEGATIVE
Comment: NORMAL
Neisseria Gonorrhea: NEGATIVE

## 2022-03-07 ENCOUNTER — Other Ambulatory Visit: Payer: Self-pay

## 2022-03-07 ENCOUNTER — Encounter: Payer: Self-pay | Admitting: Obstetrics & Gynecology

## 2022-03-07 ENCOUNTER — Ambulatory Visit (INDEPENDENT_AMBULATORY_CARE_PROVIDER_SITE_OTHER): Payer: Medicaid Other | Admitting: Obstetrics & Gynecology

## 2022-03-07 ENCOUNTER — Other Ambulatory Visit: Payer: Self-pay | Admitting: Obstetrics & Gynecology

## 2022-03-07 ENCOUNTER — Other Ambulatory Visit: Payer: Self-pay | Admitting: *Deleted

## 2022-03-07 VITALS — BP 138/87 | HR 77 | Ht 66.75 in | Wt 207.8 lb

## 2022-03-07 DIAGNOSIS — N870 Mild cervical dysplasia: Secondary | ICD-10-CM | POA: Diagnosis not present

## 2022-03-07 DIAGNOSIS — Z01812 Encounter for preprocedural laboratory examination: Secondary | ICD-10-CM

## 2022-03-07 DIAGNOSIS — R87613 High grade squamous intraepithelial lesion on cytologic smear of cervix (HGSIL): Secondary | ICD-10-CM | POA: Diagnosis not present

## 2022-03-07 HISTORY — PX: LEEP: SHX91

## 2022-03-07 LAB — POCT URINE PREGNANCY: Preg Test, Ur: NEGATIVE

## 2022-03-07 NOTE — Progress Notes (Signed)
? ?  GYNECOLOGY OFFICE PROCEDURE NOTE ? ?Amanda Fields is a 46 y.o. [id number] here for LEEP. No GYN concerns. Pap smear and colposcopy history reviewed.   ? ?Pap ASCUS +HRHPV on 11/22/2021 ?Colpo Biopsy Detached fragmens of dysplastic squamous mucosa with several fragments suggestive of high grade lesion.  ECC benign. ?CIN I seen after colposcopy for LGSIL pap in 2021. ? ?Risks, benefits, alternatives, and limitations of procedure explained to patient, including pain, bleeding, infection, failure to remove abnormal tissue and failure to cure dysplasia, need for repeat procedures, damage to pelvic organs, cervical incompetence.  Role of HPV,cervical dysplasia and need for close followup was empasized. Informed written consent was obtained. All questions were answered. Time out performed. Urine pregnancy test was negative. ? ???Procedure: The patient was placed in lithotomy position and the bivalved coated speculum was placed in the patient's vagina. A grounding pad placed on the patient. Lugol's solution was applied to the cervix and areas of decreased uptake were noted around the transformation zone.   Local anesthesia was administered via an intracervical block using 10 ml of 2% Lidocaine with epinephrine. The suction was turned on and the Medium 1X Fisher Cone Biopsy Excisor on 42 Watts of blended current was used to excise the area of decreased uptake and excise the entire transformation zone. Excellent hemostasis was achieved using roller ball coagulation set at 60 Watts coagulation current. Monsel's solution was then applied and the speculum was removed from the vagina. Specimens were sent to pathology. ? ??The patient tolerated the procedure well. Post-operative instructions given to patient, including instruction to seek medical attention for persistent bright red bleeding, fever, abdominal/pelvic pain, dysuria, nausea or vomiting. She was also told about the possibility of having copious yellow to black  tinged discharge for weeks. She was counseled to avoid anything in the vagina (sex/douching/tampons) for 3 weeks. She has a 4 week post-operative check to assess wound healing, review results and discuss further management.  ? ? ? ?Verita Schneiders, MD, FACOG ?Obstetrician Social research officer, government, Faculty Practice ?[facility] ?

## 2022-03-07 NOTE — Telephone Encounter (Signed)
erroneous

## 2022-03-07 NOTE — Patient Instructions (Signed)
LEEP POST-PROCEDURE INSTRUCTIONS ? ?You may take Ibuprofen, Aleve or Tylenol for pain if needed.  Cramping is normal. ? ?You will have black and/or bloody discharge at first.  This will lighten and then turn clear before completely resolving.  This will take 2 to 3 weeks. ? ?Put nothing in your vagina until the bleeding or discharge stops (usually 2 or 3 weeks). ? ?You need to call if you have redness around the biopsy site, if there is any unusual draining, if the bleeding is heavy, or if you are concerned. ? ?Shower or bathe as normal ? ?We will call you within one week with results or we will discuss the results at your follow-up appointment if needed. ? ?You will need to return for a follow-up Pap smear as directed by your physician. ?

## 2022-03-14 ENCOUNTER — Telehealth: Payer: Self-pay | Admitting: *Deleted

## 2022-03-14 NOTE — Telephone Encounter (Signed)
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 ?- MARGINS NEGATIVE FOR CIN-I ? ?Low grade dysplasia seen, negative margins ?This is reassuring. We will need to repeat pap and HPV test in one year. Please call to inform patient of results and recommendations. ? ?

## 2022-03-14 NOTE — Telephone Encounter (Signed)
Pt informed of leep results and repeat pap in one year ?

## 2022-04-11 ENCOUNTER — Ambulatory Visit: Payer: Medicaid Other | Admitting: Obstetrics & Gynecology

## 2022-04-11 ENCOUNTER — Encounter: Payer: Self-pay | Admitting: Obstetrics & Gynecology

## 2022-04-11 VITALS — BP 134/95 | HR 75 | Wt 208.0 lb

## 2022-04-11 DIAGNOSIS — N870 Mild cervical dysplasia: Secondary | ICD-10-CM

## 2022-04-11 DIAGNOSIS — Z9889 Other specified postprocedural states: Secondary | ICD-10-CM

## 2022-04-11 NOTE — Progress Notes (Signed)
? ?GYNECOLOGY OFFICE VISIT NOTE ? ?History:  ? Amanda Fields is a 46 y.o. [id number] here today for follow up after LEEP on 03/07/2022 done for concern about high grade dysplasia. She denies any abnormal vaginal discharge, bleeding, pelvic pain or other concerns.  ?  ?Past Medical History:  ?Diagnosis Date  ? ADHD (attention deficit hyperactivity disorder), 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  ? Adrenal mass, left ([place])   ? Anemia   ? none since Hysterectomy done-related to heavy menses  ? Asthma   ? Related to allergies"wheezes with pollen exposure" no asthma attacks  ? Breast lipoma 09/06/2011  ? resolved- no surgery-tx. medically.  ? Cough due to ACE inhibitor   ? Dizziness   ? Edema leg   ? Elevated troponin I level 12/31/2015  ? Endometriosis   ? Hypertension   ? a. echo 03/2012 EF 60-65%, moderate LVH, nl LV diastolic fxn, nl PASP; b. echo 12/2015: EF 55-60%  ? Hypertensive urgency 12/31/2015  ? Migraine headache   ? migrianes-less frequent now  ? Obesity   ? Palpitation   ? Pre-diabetes 09/27/2018  ? ? ?Past Surgical History:  ?Procedure Laterality Date  ? COLONOSCOPY WITH PROPOFOL N/A 04/09/2017  ? Procedure: COLONOSCOPY WITH PROPOFOL;  Surgeon: Lucilla Lame, MD;  Location: [facility];  Service: Endoscopy;  Laterality: N/A;  LATEX sensitivity  ? IR GENERIC HISTORICAL  07/07/2016  ? IR VENOGRAM RENAL UNI LEFT 07/07/2016 Aletta Edouard, MD [facility]  ? IR GENERIC HISTORICAL  07/07/2016  ? IR VENOUS SAMPLING 07/07/2016 Aletta Edouard, MD [facility]  ? IR GENERIC HISTORICAL  07/07/2016  ? IR VENOGRAM ADRENAL BI 07/07/2016 Aletta Edouard, MD [facility]  ? IR GENERIC HISTORICAL  07/07/2016  ? IR US GUIDE VASC ACCESS RIGHT 07/07/2016 Aletta Edouard, MD [facility]  ? IR GENERIC HISTORICAL  07/07/2016  ? IR ANGIOGRAM SELECTIVE EACH ADDITIONAL VESSEL 07/07/2016 Aletta Edouard, MD [facility]  ? IR GENERIC HISTORICAL  07/07/2016  ? IR VENOUS SAMPLING 07/07/2016 Aletta Edouard, MD [facility]  ? IR GENERIC HISTORICAL   07/07/2016  ? IR US GUIDE VASC ACCESS RIGHT 07/07/2016 Aletta Edouard, MD [facility]  ? LAPAROSCOPIC SUPRACERVICAL HYSTERECTOMY  09/27/2010  ? For endometriosis. Ovaries and tubes are in place.  ? NASAL SEPTUM SURGERY    ? ROBOTIC ADRENALECTOMY Left 10/27/2016  ? Procedure: XI ROBOTIC LEFT ADRENALECTOMY;  Surgeon: Michael Boston, MD;  Location: [facility];  Service: General;  Laterality: Left;  ? TONSILLECTOMY    ? TUBAL LIGATION  2006  ? WISDOM TOOTH EXTRACTION    ? ? ?The following portions of the patient's history were reviewed and updated as appropriate: allergies, current medications, past family history, past medical history, past social history, past surgical history and problem list.  ? ?Health Maintenance:  Normal mammogram on 03/02/2022.  ? ?Review of Systems:  ?Pertinent items noted in HPI and remainder of comprehensive ROS otherwise negative. ? ?Physical Exam:  ?BP (!) 134/95   Pulse 75   Wt 208 lb (94.3 kg)   BMI 32.82 kg/m?  ?CONSTITUTIONAL: Well-developed, well-nourished female in no acute distress.  ?MUSCULOSKELETAL: Normal range of motion. No edema noted. ?NEUROLOGIC: Alert and oriented to person, place, and time. Normal muscle tone coordination. No cranial nerve deficit noted. ?PSYCHIATRIC: Normal mood and affect. Normal behavior. Normal judgment and thought content. ?CARDIOVASCULAR: Normal heart rate noted ?RESPIRATORY: Effort and breath sounds normal, no problems with respiration noted ?ABDOMEN: No masses noted. No other overt distention noted.   ?PELVIC:  Normal appearing external genitalia; normal urethral meatus; normal appearing vaginal mucosa and cervix. Well-healed LEEP site. No abnormal discharge noted. Performed in the presence of a chaperone ? 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 ?- MARGINS NEGATIVE FOR CIN-I ?    ?Assessment and Plan:  ?   ?1. S/P LEEP ?2. Dysplasia of cervix, low grade (CIN  1) ?Well-healed LEEP site, no issues. Cleared from pelvic rest. ?Low grade dysplasia seen, negative margins. Discussed with patient, this is reassuring.  ?Will need to repeat pap and HPV test in one year. ?Routine preventative health maintenance measures emphasized. ?Please refer to After Visit Summary for other counseling recommendations.  ? ?Return in about 1 year (around 04/12/2023) for Pap and HPV testing.   ? ?I spent 20 minutes dedicated to the care of this patient including pre-visit review of records, face to face time with the patient discussing her conditions and treatments and post visit orders. ? ? ? ?Verita Schneiders, MD, FACOG ?Obstetrician Social research officer, government, Faculty Practice ?[facility] ?
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
## 2022-05-10 ENCOUNTER — Ambulatory Visit: Payer: Medicaid Other | Admitting: Family Medicine

## 2022-05-10 ENCOUNTER — Encounter: Payer: Self-pay | Admitting: Family Medicine

## 2022-05-10 VITALS — BP 136/88 | HR 79 | Resp 16 | Ht 66.0 in | Wt 207.0 lb

## 2022-05-10 DIAGNOSIS — G43001 Migraine without aura, not intractable, with status migrainosus: Secondary | ICD-10-CM

## 2022-05-10 DIAGNOSIS — F411 Generalized anxiety disorder: Secondary | ICD-10-CM

## 2022-05-10 DIAGNOSIS — F339 Major depressive disorder, recurrent, unspecified: Secondary | ICD-10-CM | POA: Diagnosis not present

## 2022-05-10 DIAGNOSIS — I10 Essential (primary) hypertension: Secondary | ICD-10-CM | POA: Diagnosis not present

## 2022-05-10 DIAGNOSIS — G4700 Insomnia, unspecified: Secondary | ICD-10-CM

## 2022-05-10 DIAGNOSIS — E669 Obesity, unspecified: Secondary | ICD-10-CM | POA: Diagnosis not present

## 2022-05-10 DIAGNOSIS — R7303 Prediabetes: Secondary | ICD-10-CM | POA: Diagnosis not present

## 2022-05-10 DIAGNOSIS — R002 Palpitations: Secondary | ICD-10-CM

## 2022-05-10 DIAGNOSIS — E896 Postprocedural adrenocortical (-medullary) hypofunction: Secondary | ICD-10-CM

## 2022-05-10 DIAGNOSIS — M79672 Pain in left foot: Secondary | ICD-10-CM

## 2022-05-10 MED ORDER — METOPROLOL SUCCINATE ER 50 MG PO TB24: ORAL_TABLET | ORAL | 1 refills | Status: DC

## 2022-05-10 MED ORDER — ESCITALOPRAM OXALATE 10 MG PO TABS: 10.0000 mg | ORAL_TABLET | Freq: Every day | ORAL | 1 refills | Status: DC

## 2022-05-10 MED ORDER — QUETIAPINE FUMARATE 25 MG PO TABS: 25.0000 mg | ORAL_TABLET | Freq: Every day | ORAL | 0 refills | Status: DC

## 2022-05-10 MED ORDER — SPIRONOLACTONE-HCTZ 25-25 MG PO TABS: 1.0000 | ORAL_TABLET | Freq: Every day | ORAL | 1 refills | Status: DC

## 2022-05-10 MED ORDER — WEGOVY 0.25 MG/0.5ML ~~LOC~~ SOAJ: 0.2500 mg | SUBCUTANEOUS | 0 refills | Status: DC

## 2022-05-17 ENCOUNTER — Ambulatory Visit: Payer: Medicaid Other | Admitting: Podiatry

## 2022-05-24 ENCOUNTER — Ambulatory Visit: Payer: Medicaid Other | Admitting: Podiatry

## 2022-05-24 ENCOUNTER — Ambulatory Visit (INDEPENDENT_AMBULATORY_CARE_PROVIDER_SITE_OTHER): Payer: Medicaid Other

## 2022-05-24 ENCOUNTER — Encounter: Payer: Self-pay | Admitting: Podiatry

## 2022-05-24 DIAGNOSIS — S8492XA Injury of unspecified nerve at lower leg level, left leg, initial encounter: Secondary | ICD-10-CM

## 2022-05-24 DIAGNOSIS — G5762 Lesion of plantar nerve, left lower limb: Secondary | ICD-10-CM

## 2022-05-24 DIAGNOSIS — M7751 Other enthesopathy of right foot: Secondary | ICD-10-CM

## 2022-05-24 DIAGNOSIS — M779 Enthesopathy, unspecified: Secondary | ICD-10-CM

## 2022-05-24 DIAGNOSIS — G5792 Unspecified mononeuropathy of left lower limb: Secondary | ICD-10-CM
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
## 2022-06-17 DIAGNOSIS — I10 Essential (primary) hypertension: Secondary | ICD-10-CM | POA: Diagnosis not present

## 2022-06-21 ENCOUNTER — Encounter: Payer: Self-pay | Admitting: Family Medicine

## 2022-06-23 ENCOUNTER — Ambulatory Visit: Payer: Self-pay

## 2022-06-23 NOTE — Telephone Encounter (Signed)
Pt is is calling to report that she has a manful lump her arm. Apt was scheduled 07/12/22 wit Dr. Ancil Boozer. Pt declined a sooner appt with another provider. Pt would like to know what medication she can take for the pain?      Chief Complaint: Pain left bicep at underarm. Pea size lump noted. Symptoms: Above Frequency: 2 weeks ago Pertinent Negatives: Patient denies fever Disposition: '[]'$ ED /'[]'$ Urgent Care (no appt availability in office) / '[]'$ Appointment(In office/virtual)/ '[]'$  [facility]/ '[]'$ Home Care/ '[]'$ Refused Recommended Disposition /'[]'$ [facility]/ '[x]'$  Follow-up with PCP Additional Notes: Asking to be worked in sooner and what can she take for pain. Discussed OTC medications. Please advise pt.  Answer Assessment - Initial Assessment Questions 1. ONSET: "When did the pain start?"     2 weeks ago 2. LOCATION: "Where is the pain located?"     Left bicep at underarm 3. PAIN: "How bad is the pain?" (Scale 1-10; or mild, moderate, severe)   - MILD (1-3): doesn't interfere with normal activities   - MODERATE (4-7): interferes with normal activities (e.g., work or school) or awakens from sleep   - SEVERE (8-10): excruciating pain, unable to do any normal activities, unable to hold a cup of water     Mild - severe 4. WORK OR EXERCISE: "Has there been any recent work or exercise that involved this part of the body?"     No 5. CAUSE: "What do you think is causing the arm pain?"     Unsure 6. OTHER SYMPTOMS: "Do you have any other symptoms?" (e.g., neck pain, swelling, rash, fever, numbness, weakness)     Pea size lump 7. PREGNANCY: "Is there any chance you are pregnant?" "When was your last menstrual period?"     No  Protocols used: Arm Pain-A-AH

## 2022-06-26 NOTE — Telephone Encounter (Signed)
Pt declined earlier appt with another provider

## 2022-07-11 NOTE — Progress Notes (Unsigned)
Name: Amanda Fields   MRN: [id number]    DOB: [birth year]   Date:07/12/2022       Progress Note  Subjective  Chief Complaint  Lump under L arm  HPI  Left upper arm cyst: she noticed a lump on left upper inner arm about one month ago, it changes in size, occasionally painful, no redness or increase in warmth and no drainage  Lower extremity edema: denies orthopnea, chest pain, just on lower legs and ankles, worse at the end of the day, works sitting down and already on diuretics, advised compression stocking hoses and leg elevation    Patient Active Problem List   Diagnosis Date Noted  . Skin tags, multiple acquired 07/12/2022  . ASCUS with positive high risk HPV cervical on 11/22/2021 11/28/2021  . Status post laparoscopic supracervical hysterectomy for endometriosis 11/22/2021  . Acne vulgaris 06/13/2021  . Other hypertrophic disorders of the skin 06/13/2021  . Dysplasia of cervix, low grade (CIN 1) 12/15/2020  . Chronic rhinitis 08/05/2020  . 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 Cervical dysplasia 11/24/2019  . Hypertension due to endocrine disorder 12/10/2018  . Pre-diabetes 09/27/2018  . Prolapsed internal hemorrhoids, grade 2 08/28/2018  . History of endometriosis 04/10/2018  . GAD (generalized anxiety disorder) 04/10/2018  . Mild recurrent major depression ([place]) 04/10/2018  . Obesity (BMI 30.0-34.9) 04/10/2018  . ADHD (attention deficit hyperactivity disorder), 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  . Family history of malignant neoplasm of gastrointestinal tract   . 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 Allergic rhinitis 02/04/2016  . Bilateral leg edema 04/09/2012    Past Surgical History:  Procedure Laterality Date  . COLONOSCOPY WITH PROPOFOL N/A 04/09/2017   Procedure: COLONOSCOPY WITH PROPOFOL;  Surgeon: Lucilla Lame, MD;  Location: [place];  Service: Endoscopy;  Laterality: N/A;  LATEX sensitivity  . IR GENERIC HISTORICAL  07/07/2016   IR VENOGRAM RENAL UNI  LEFT 07/07/2016 Aletta Edouard, MD [facility]  . IR GENERIC HISTORICAL  07/07/2016   IR VENOUS SAMPLING 07/07/2016 Aletta Edouard, MD [facility]  . IR GENERIC HISTORICAL  07/07/2016   IR VENOGRAM ADRENAL BI 07/07/2016 Aletta Edouard, MD [facility]  . IR GENERIC HISTORICAL  07/07/2016   IR US GUIDE VASC ACCESS RIGHT 07/07/2016 Aletta Edouard, MD [facility]  . IR GENERIC HISTORICAL  07/07/2016   IR ANGIOGRAM SELECTIVE EACH ADDITIONAL VESSEL 07/07/2016 Aletta Edouard, MD [facility]  . IR GENERIC HISTORICAL  07/07/2016   IR VENOUS SAMPLING 07/07/2016 Aletta Edouard, MD [facility]  . IR GENERIC HISTORICAL  07/07/2016   IR US GUIDE VASC ACCESS RIGHT 07/07/2016 Aletta Edouard, MD [facility]  . LAPAROSCOPIC SUPRACERVICAL HYSTERECTOMY  09/27/2010   For endometriosis. Ovaries and tubes are in place.  Marland Kitchen NASAL SEPTUM SURGERY    . ROBOTIC ADRENALECTOMY Left 10/27/2016   Procedure: XI ROBOTIC LEFT ADRENALECTOMY;  Surgeon: Michael Boston, MD;  Location: [facility];  Service: General;  Laterality: Left;  . TONSILLECTOMY    . TUBAL LIGATION  2006  . WISDOM TOOTH EXTRACTION      Family History  Problem Relation Age of Onset  . COPD Mother   . Hypotension Mother   . Colon cancer Father   . Emphysema Father   . Cancer Father        colon  . Hyperlipidemia Sister   . Heart failure Paternal Uncle   . Heart attack Cousin 32  . Heart disease Cousin        heart attack  .  Breast cancer Maternal Aunt   . Adrenal disorder Neg Hx     Social History   Tobacco Use  . Smoking status: Never  . Smokeless tobacco: Never  Substance Use Topics  . Alcohol use: Yes    Comment: social (1 drink/mo)     Current Outpatient Medications:  .  escitalopram (LEXAPRO) 10 MG tablet, Take 1 tablet (10 mg total) by mouth daily., Disp: 90 tablet, Rfl: 1 .  fluticasone (FLONASE) 50 MCG/ACT nasal spray, Place 2 sprays into both nostrils daily., Disp: 48 g, Rfl: 1 .  levocetirizine (XYZAL) 5 MG tablet, Take 1  tablet (5 mg total) by mouth every evening., Disp: 90 tablet, Rfl: 1 .  methylphenidate (CONCERTA) 27 MG PO CR tablet, Take 1 tablet (27 mg total) by mouth every morning., Disp: 30 tablet, Rfl: 0 .  methylphenidate (CONCERTA) 27 MG PO CR tablet, Take 1 tablet (27 mg total) by mouth every morning., Disp: 30 tablet, Rfl: 0 .  methylphenidate 27 MG PO CR tablet, Take 1 tablet (27 mg total) by mouth every morning., Disp: 30 tablet, Rfl: 0 .  metoprolol succinate (TOPROL-XL) 50 MG 24 hr tablet, Take with or immediately following a meal., Disp: 90 tablet, Rfl: 1 .  QUEtiapine (SEROQUEL) 25 MG tablet, Take 1 tablet (25 mg total) by mouth at bedtime. In place of Abilify, Disp: 90 tablet, Rfl: 0 .  Semaglutide-Weight Management (WEGOVY) 0.25 MG/0.5ML SOAJ, Inject 0.25 mg into the skin once a week., Disp: 2 mL, Rfl: 0 .  spironolactone-hydrochlorothiazide (ALDACTAZIDE) 25-25 MG tablet, Take 1 tablet by mouth daily., Disp: 90 tablet, Rfl: 1 .  Ubrogepant (UBRELVY) 100 MG TABS, Take 1 tablet by mouth every other day., Disp: 16 tablet, Rfl: 2  Allergies  Allergen Reactions  . Ace Inhibitors Cough  . Latex Itching    Gloves, condoms  . Pollen Extract     I personally reviewed active problem list, medication list, allergies, family history, social history, health maintenance with the patient/caregiver today.   ROS  Ten systems reviewed and is negative except as mentioned in HPI   Objective  Vitals:   07/12/22 0952  BP: 134/86  Pulse: 91  Resp: 16  SpO2: 98%  Weight: 214 lb (97.1 kg)  Height: '5\' 6"'$  (1.676 m)    Body mass index is 34.54 kg/m.  Physical Exam  Constitutional: Patient appears well-developed and well-nourished. Obese  No distress.  HEENT: head atraumatic, normocephalic, pupils equal and reactive to light,, neck supple Cardiovascular: Normal rate, regular rhythm and normal heart sounds.  No murmur heard.2 plus  BLE edema. Pulmonary/Chest: Effort normal and breath sounds  normal. No respiratory distress. Abdominal: Soft.  There is no tenderness. Skin: pea size non tender area, rolls under skin on left upper inner arm Psychiatric: Patient has a normal mood and affect. behavior is normal. Judgment and thought content normal.    PHQ2/9:    07/12/2022    9:53 AM 05/10/2022   11:25 AM 02/21/2022    9:07 AM 11/22/2021    3:13 PM 07/12/2021    3:13 PM  Depression screen PHQ 2/9  Decreased Interest 0 3 0 0 0  Down, Depressed, Hopeless 0 0 0 0 1  PHQ - 2 Score 0 3 0 0 1  Altered sleeping  3 0 0 3  Tired, decreased energy  0 0 0 0  Change in appetite  3 0 0 0  Feeling bad or failure about yourself   0 0 0 0  Trouble concentrating  0 0 0 0  Moving slowly or fidgety/restless  0 0 0 0  Suicidal thoughts  0 0 0 0  PHQ-9 Score  9 0 0 4    phq 9 is negative   Fall Risk:    07/12/2022    9:53 AM 05/10/2022   11:25 AM 02/21/2022    9:07 AM 11/22/2021    3:07 PM 07/12/2021    3:13 PM  Fall Risk   Falls in the past year? 0 0 0 0 0  Number falls in past yr: 0 0 0 0 0  Injury with Fall? 0 0 0 0 0  Risk for fall due to : No Fall Risks No Fall Risks No Fall Risks No Fall Risks   Follow up Falls prevention discussed Falls prevention discussed Falls prevention discussed Falls prevention discussed       Functional Status Survey: Is the patient deaf or have difficulty hearing?: No Does the patient have difficulty seeing, even when wearing glasses/contacts?: No Does the patient have difficulty concentrating, remembering, or making decisions?: No Does the patient have difficulty walking or climbing stairs?: No Does the patient have difficulty dressing or bathing?: No Does the patient have difficulty doing errands alone such as visiting a doctor's office or shopping?: No    Assessment & Plan  1. Benign cyst of skin  - Ambulatory referral to Dermatology   2. Bilateral lower extremity edema  Discussed compression stocking hoses

## 2022-07-12 ENCOUNTER — Ambulatory Visit: Payer: Medicaid Other | Admitting: Family Medicine

## 2022-07-12 ENCOUNTER — Encounter: Payer: Self-pay | Admitting: Family Medicine

## 2022-07-12 VITALS — BP 134/86 | HR 91 | Resp 16 | Ht 66.0 in | Wt 214.0 lb

## 2022-07-12 DIAGNOSIS — L729 Follicular cyst of the skin and subcutaneous tissue, unspecified: Secondary | ICD-10-CM | POA: Diagnosis not present

## 2022-07-12 DIAGNOSIS — L918 Other hypertrophic disorders of the skin: Secondary | ICD-10-CM | POA: Insufficient documentation

## 2022-07-12 DIAGNOSIS — R600 Localized edema: Secondary | ICD-10-CM

## 2022-07-18 ENCOUNTER — Encounter: Payer: Self-pay | Admitting: Family Medicine

## 2022-07-18 ENCOUNTER — Other Ambulatory Visit: Payer: Self-pay | Admitting: Family Medicine

## 2022-07-18 DIAGNOSIS — I10 Essential (primary) hypertension: Secondary | ICD-10-CM | POA: Diagnosis not present

## 2022-07-18 DIAGNOSIS — F900 Attention-deficit hyperactivity disorder, predominantly inattentive type: Secondary | ICD-10-CM

## 2022-07-18 MED ORDER — LISDEXAMFETAMINE DIMESYLATE 40 MG PO CAPS: 40.0000 mg | ORAL_CAPSULE | ORAL | 0 refills | Status: DC

## 2022-08-03 DIAGNOSIS — L720 Epidermal cyst: Secondary | ICD-10-CM | POA: Diagnosis not present
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
## 2022-08-11 ENCOUNTER — Ambulatory Visit: Payer: Medicaid Other | Admitting: Family Medicine

## 2022-08-15 ENCOUNTER — Other Ambulatory Visit: Payer: Self-pay | Admitting: Family Medicine

## 2022-08-15 DIAGNOSIS — G4700 Insomnia, unspecified: Secondary | ICD-10-CM

## 2022-08-15 DIAGNOSIS — F339 Major depressive disorder, recurrent, unspecified: Secondary | ICD-10-CM

## 2022-08-18 DIAGNOSIS — I10 Essential (primary) hypertension: Secondary | ICD-10-CM | POA: Diagnosis not present

## 2022-09-01 NOTE — Progress Notes (Unsigned)
Name: Amanda Fields   MRN: [id number]    DOB: [birth year]   Date:09/04/2022       Progress Note  Subjective  Chief Complaint  Follow Up  HPI  History of secondary hypertension, currently essential HTN : she had adrenalectomy in 2017   She is now on Metoprolol because of palpitation, and spironalactone hctz and is doing well, bp has been at goal. No side effects.   Obesity:her weight was stable in the low 220's , had COVID -19 in Nov 2021 and since than she lost over 15 lbs. Currently stable between 214-217 lbs. She has been taking a protein shake for lunch and dinner.  She asked about weight loss medications, we discussed Mancel Parsons - she denies family history of thyroid cancer or personal history of pancreatitis - and gave her a rx in May, however due to [place] shortage she has been unable to fill the rx. She is drinking water and unsweetened green tea. Discussed intermittent fasting   Dyslipidemia: last LDL was high at 154, discussed healthy diet   The 10-year ASCVD risk score (Arnett DK, et al., 2019) is: 5.3%   Values used to calculate the score:     Age: 46 years     Sex: Female     Is Non-Hispanic African American: Yes     Diabetic: No     Tobacco smoker: No     Systolic Blood Pressure: 496 mmHg     Is BP treated: Yes     HDL Cholesterol: 47 mg/dL     Total Cholesterol: 216 mg/dL   Major Depression and Anxiety: She found a temporary job through [employer] and is working from home since 08/21  [employer] intake she is now a full time employee - since April 2023. Daughter moved out with her grand-daughter August 2023. Stress has decreased and she  stopped taking Lexapro months ago. She does not want to go back taking medication at this time   Migraines: It is described as sharp, nagging, pulsating and started on her nuchal area, and radiates to her entire head, sometimes has nausea and vomiting. Also has phonophobia and photophobia  She states episodes are sporadic,  she takes [place] prn   Abnormal pap smear: pap smear done 11/2021 positive high risk HPV but negative for 16/18/45 types. ASCUS , we will recheck it yearly - due for repeat Dec 2023   Insomnia: she has difficulty falling and staying asleep, symptoms started over 5 years ago, she has tried multiple medications, she had sleep study in the past, tried Ambien , Trazodone, benadryl  and melatonin. Currently taking Seroquel , helps her fall asleep but only sleeps for about 4 hours and gest up, she gets out of bed . Advised to stay in bed - discussed love and kindness meditation when she wakes up  Perennial allergic rhinitis: she has a history of AR since her teenage years, she has itchy eyes and ears, rhinorrhea and nasal congestion even on medications. 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/06/2020   Cervical  dysplasia 11/24/2019   Hypertension due to endocrine disorder 12/10/2018   Pre-diabetes 09/27/2018   Prolapsed internal hemorrhoids, grade 2 08/28/2018   History of endometriosis 04/10/2018   GAD (generalized anxiety disorder) 04/10/2018   Mild recurrent major depression ([place]) 04/10/2018   Obesity (BMI 30.0-34.9) 04/10/2018   ADHD (attention deficit hyperactivity disorder), 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   Family history of malignant neoplasm of gastrointestinal tract    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   Allergic rhinitis 02/04/2016   Bilateral leg edema 04/09/2012    Past Surgical History:  Procedure Laterality Date   COLONOSCOPY WITH PROPOFOL N/A 04/09/2017    Procedure: COLONOSCOPY WITH PROPOFOL;  Surgeon: Lucilla Lame, MD;  Location: [facility];  Service: Endoscopy;  Laterality: N/A;  LATEX sensitivity   IR GENERIC HISTORICAL  07/07/2016   IR VENOGRAM RENAL UNI LEFT 07/07/2016 Aletta Edouard, MD [facility]   IR GENERIC HISTORICAL  07/07/2016   IR VENOUS SAMPLING 07/07/2016 Aletta Edouard, MD [facility]   IR GENERIC HISTORICAL  07/07/2016   IR VENOGRAM ADRENAL BI 07/07/2016 Aletta Edouard, MD [facility]   IR GENERIC HISTORICAL  07/07/2016   IR US GUIDE VASC ACCESS RIGHT 07/07/2016 Aletta Edouard, MD [facility]   IR GENERIC HISTORICAL  07/07/2016   IR ANGIOGRAM SELECTIVE EACH ADDITIONAL VESSEL 07/07/2016 Aletta Edouard, MD [facility]   IR GENERIC HISTORICAL  07/07/2016   IR VENOUS SAMPLING 07/07/2016 Aletta Edouard, MD [facility]   IR GENERIC HISTORICAL  07/07/2016   IR US GUIDE VASC ACCESS RIGHT 07/07/2016 Aletta Edouard, MD [facility]   LAPAROSCOPIC SUPRACERVICAL HYSTERECTOMY  09/27/2010   For endometriosis. Ovaries and tubes are in place.   NASAL SEPTUM SURGERY     ROBOTIC ADRENALECTOMY Left 10/27/2016   Procedure: XI ROBOTIC LEFT ADRENALECTOMY;  Surgeon: Michael Boston, MD;  Location: [facility];  Service: General;  Laterality: Left;   TONSILLECTOMY     TUBAL LIGATION  2006   WISDOM TOOTH EXTRACTION      Family History  Problem Relation Age of Onset   COPD Mother    Hypotension Mother    Colon cancer Father    Emphysema Father    Cancer Father        colon   Hyperlipidemia Sister    Heart failure Paternal Uncle    Heart attack Cousin 50   Heart disease Cousin        heart attack   Breast cancer Maternal Aunt    Adrenal disorder Neg Hx     Social History   Tobacco Use   Smoking status: Never   Smokeless tobacco: Never  Substance Use Topics   Alcohol use: Yes    Comment: social (1 drink/mo)     Current Outpatient Medications:    escitalopram (LEXAPRO) 10 MG tablet, Take 1 tablet (10 mg total) by mouth  daily., Disp: 90 tablet, Rfl: 1   fluticasone (FLONASE) 50 MCG/ACT nasal spray, Place 2 sprays into both nostrils daily., Disp: 48 g, Rfl: 1   levocetirizine (XYZAL) 5 MG tablet, Take 1 tablet (5 mg total) by mouth every evening., Disp: 90 tablet, Rfl: 1   lisdexamfetamine (VYVANSE) 40 MG capsule, Take 1 capsule (40 mg total) by mouth every morning., Disp: 30 capsule, Rfl: 0   metoprolol succinate (TOPROL-XL) 50 MG 24 hr tablet, Take with or immediately following a meal., Disp: 90 tablet, Rfl: 1   QUEtiapine (SEROQUEL) 25 MG tablet, Take 1 tablet (25 mg total) by mouth at bedtime. In place of Abilify, Disp:  90 tablet, Rfl: 0   Semaglutide-Weight Management (WEGOVY) 0.25 MG/0.5ML SOAJ, Inject 0.25 mg into the skin once a week., Disp: 2 mL, Rfl: 0   spironolactone-hydrochlorothiazide (ALDACTAZIDE) 25-25 MG tablet, Take 1 tablet by mouth daily., Disp: 90 tablet, Rfl: 1   Ubrogepant (UBRELVY) 100 MG TABS, Take 1 tablet by mouth every other day., Disp: 16 tablet, Rfl: 2  Allergies  Allergen Reactions   Ace Inhibitors Cough   Latex Itching    Gloves, condoms   Pollen Extract     I personally reviewed active problem list, medication list, allergies, family history, social history, health maintenance with the patient/caregiver today.   ROS  Constitutional: Negative for fever or weight change.  Respiratory: Negative for cough and shortness of breath.   Cardiovascular: Negative for chest pain or palpitations.  Gastrointestinal: Negative for abdominal pain, no bowel changes.  Musculoskeletal: Negative for gait problem or joint swelling.  Skin: Negative for rash.  Neurological: Negative for dizziness or headache.  No other specific complaints in a complete review of systems (except as listed in HPI above).   Objective  Vitals:   09/04/22 1037  BP: 136/88  Pulse: 93  Resp: 16  SpO2: 96%  Weight: 217 lb (98.4 kg)  Height: '5\' 6"'$  (1.676 m)    Body mass index is 35.02 kg/m.  Physical  Exam  Constitutional: Patient appears well-developed and well-nourished. Obese  No distress.  HEENT: head atraumatic, normocephalic, pupils equal and reactive to light, neck supple Cardiovascular: Normal rate, regular rhythm and normal heart sounds.  No murmur heard. No BLE edema. Pulmonary/Chest: Effort normal and breath sounds normal. No respiratory distress. Abdominal: Soft.  There is no tenderness. Psychiatric: Patient has a normal mood and affect. behavior is normal. Judgment and thought content normal.    PHQ2/9:    09/04/2022   10:41 AM 07/12/2022    9:53 AM 05/10/2022   11:25 AM 02/21/2022    9:07 AM 11/22/2021    3:13 PM  Depression screen PHQ 2/9  Decreased Interest 0 0 3 0 0  Down, Depressed, Hopeless 0 0 0 0 0  PHQ - 2 Score 0 0 3 0 0  Altered sleeping 3  3 0 0  Tired, decreased energy 3  0 0 0  Change in appetite 3  3 0 0  Feeling bad or failure about yourself  0  0 0 0  Trouble concentrating 3  0 0 0  Moving slowly or fidgety/restless 0  0 0 0  Suicidal thoughts 0  0 0 0  PHQ-9 Score 12  9 0 0    phq 9 is positive   Fall Risk:    09/04/2022   10:36 AM 07/12/2022    9:53 AM 05/10/2022   11:25 AM 02/21/2022    9:07 AM 11/22/2021    3:07 PM  Fall Risk   Falls in the past year? 0 0 0 0 0  Number falls in past yr: 0 0 0 0 0  Injury with Fall? 0 0 0 0 0  Risk for fall due to : No Fall Risks No Fall Risks No Fall Risks No Fall Risks No Fall Risks  Follow up Falls prevention discussed Falls prevention discussed Falls prevention discussed Falls prevention discussed Falls prevention discussed      Functional Status Survey: Is the patient deaf or have difficulty hearing?: No Does the patient have difficulty seeing, even when wearing glasses/contacts?: No Does the patient have difficulty concentrating, remembering, or making decisions?:  Yes Does the patient have difficulty walking or climbing stairs?: Yes Does the patient have difficulty dressing or bathing?: No Does  the patient have difficulty doing errands alone such as visiting a doctor's office or shopping?: No    Assessment & Plan  1. HTN (hypertension), benign  - metoprolol succinate (TOPROL-XL) 50 MG 24 hr tablet; Take with or immediately following a meal.  Dispense: 90 tablet; Refill: 1 - spironolactone-hydrochlorothiazide (ALDACTAZIDE) 25-25 MG tablet; Take 1 tablet by mouth daily.  Dispense: 90 tablet; Refill: 1  2. Palpitation  - metoprolol succinate (TOPROL-XL) 50 MG 24 hr tablet; Take with or immediately following a meal.  Dispense: 90 tablet; Refill: 1  3. Major depression, recurrent, chronic (HCC)  - QUEtiapine (SEROQUEL) 25 MG tablet; Take 1 tablet (25 mg total) by mouth at bedtime. In place of Abilify  Dispense: 90 tablet; Refill: 1  4. Insomnia, persistent  - QUEtiapine (SEROQUEL) 25 MG tablet; Take 1 tablet (25 mg total) by mouth at bedtime. In place of Abilify  Dispense: 90 tablet; Refill: 1  5. Migraine without aura and with status migrainosus, not intractable   6. 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 ADHD (attention deficit hyperactivity disorder), inattentive type  - lisdexamfetamine (VYVANSE) 40 MG capsule; Take 1 capsule (40 mg total) by mouth every morning.  Dispense: 90 capsule; Refill: 0  8. Pre-diabetes   9. Vitamin D deficiency  Continue supplementation   10. Dyslipidemia   11. Perennial allergic rhinitis with seasonal variation  - fluticasone (FLONASE) 50 MCG/ACT nasal spray; Place 2 sprays into both nostrils daily.  Dispense: 48 g; Refill: 1

## 2022-09-04 ENCOUNTER — Ambulatory Visit: Payer: Medicaid Other | Admitting: Family Medicine

## 2022-09-04 ENCOUNTER — Encounter: Payer: Self-pay | Admitting: Family Medicine

## 2022-09-04 VITALS — BP 136/88 | HR 93 | Resp 16 | Ht 66.0 in | Wt 217.0 lb

## 2022-09-04 DIAGNOSIS — E785 Hyperlipidemia, unspecified: Secondary | ICD-10-CM | POA: Diagnosis not present

## 2022-09-04 DIAGNOSIS — G4700 Insomnia, unspecified: Secondary | ICD-10-CM | POA: Diagnosis not present

## 2022-09-04 DIAGNOSIS — G43001 Migraine without aura, not intractable, with status migrainosus: Secondary | ICD-10-CM | POA: Diagnosis not present

## 2022-09-04 DIAGNOSIS — R002 Palpitations: Secondary | ICD-10-CM | POA: Diagnosis not present

## 2022-09-04 DIAGNOSIS — F900 Attention-deficit hyperactivity disorder, predominantly inattentive type: Secondary | ICD-10-CM | POA: Diagnosis not present

## 2022-09-04 DIAGNOSIS — R7303 Prediabetes: Secondary | ICD-10-CM | POA: Diagnosis not present

## 2022-09-04 DIAGNOSIS — J302 Other seasonal allergic rhinitis: Secondary | ICD-10-CM

## 2022-09-04 DIAGNOSIS — F339 Major depressive disorder, recurrent, unspecified: Secondary | ICD-10-CM | POA: Diagnosis not present

## 2022-09-04 DIAGNOSIS — I10 Essential (primary) hypertension: Secondary | ICD-10-CM

## 2022-09-04 DIAGNOSIS — E896 Postprocedural adrenocortical (-medullary) hypofunction: Secondary | ICD-10-CM | POA: Diagnosis not present

## 2022-09-04 DIAGNOSIS — E559 Vitamin D deficiency, unspecified: Secondary | ICD-10-CM | POA: Diagnosis not present

## 2022-09-04 DIAGNOSIS — G43009 Migraine without aura, not intractable, without status migrainosus: Secondary | ICD-10-CM | POA: Insufficient documentation

## 2022-09-04 DIAGNOSIS — J3089 Other allergic rhinitis: Secondary | ICD-10-CM | POA: Diagnosis not present

## 2022-09-04 MED ORDER — LISDEXAMFETAMINE DIMESYLATE 40 MG PO CAPS: 40.0000 mg | ORAL_CAPSULE | ORAL | 0 refills | Status: DC

## 2022-09-04 MED ORDER — METOPROLOL SUCCINATE ER 50 MG PO TB24: ORAL_TABLET | ORAL | 1 refills | Status: DC

## 2022-09-04 MED ORDER — FLUTICASONE PROPIONATE 50 MCG/ACT NA SUSP: 2.0000 | Freq: Every day | NASAL | 1 refills | Status: DC

## 2022-09-04 MED ORDER — MONTELUKAST SODIUM 10 MG PO TABS: 10.0000 mg | ORAL_TABLET | Freq: Every day | ORAL | 1 refills | Status: DC

## 2022-09-04 MED ORDER — QUETIAPINE FUMARATE 25 MG PO TABS: 25.0000 mg | ORAL_TABLET | Freq: Every day | ORAL | 1 refills | Status: DC

## 2022-09-04 MED ORDER — SPIRONOLACTONE-HCTZ 25-25 MG PO TABS: 1.0000 | ORAL_TABLET | Freq: Every day | ORAL | 1 refills | Status: DC

## 2022-09-06 DIAGNOSIS — H35033 Hypertensive retinopathy, bilateral: Secondary | ICD-10-CM | POA: Diagnosis not present

## 2022-09-11 ENCOUNTER — Ambulatory Visit: Payer: Self-pay | Admitting: *Deleted
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
## 2022-09-17 DIAGNOSIS — I10 Essential (primary) hypertension: Secondary | ICD-10-CM | POA: Diagnosis not present

## 2022-09-21 NOTE — Progress Notes (Deleted)
Name: Amanda Fields   MRN: [id number]    DOB: December 11, 1976   Date:09/21/2022       Progress Note  Subjective  Chief Complaint  Syncope/ Hypotension  HPI  *** Patient Active Problem List   Diagnosis Date Noted   Perennial allergic rhinitis with seasonal variation 09/04/2022   Dyslipidemia 09/04/2022   Vitamin D deficiency 09/04/2022   Migraine without aura and with status migrainosus, not intractable 09/04/2022   Insomnia, persistent 09/04/2022   Major depression, recurrent, chronic ([place]) 09/04/2022   Skin tags, multiple acquired 07/12/2022   ASCUS with positive high risk HPV cervical on 11/22/2021 11/28/2021   Status post laparoscopic supracervical hysterectomy for endometriosis 11/22/2021   Acne vulgaris 06/13/2021   Other hypertrophic disorders of the skin 06/13/2021   Dysplasia of cervix, low grade (CIN 1) 12/15/2020   Chronic rhinitis 08/05/2020   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   Cervical dysplasia 11/24/2019   Hypertension due to endocrine disorder 12/10/2018   Pre-diabetes 09/27/2018   Prolapsed internal hemorrhoids, grade 2 08/28/2018   History of endometriosis 04/10/2018   GAD (generalized anxiety disorder) 04/10/2018   Mild recurrent major depression ([place]) 04/10/2018   Obesity (BMI 30.0-34.9) 04/10/2018   ADHD (attention deficit hyperactivity disorder), 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   Palpitation 07/12/2017   Family history of malignant neoplasm of gastrointestinal tract    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   Allergic rhinitis 02/04/2016   Bilateral leg edema 04/09/2012   HTN (hypertension), benign     Past Surgical History:  Procedure Laterality Date   COLONOSCOPY WITH PROPOFOL N/A 04/09/2017   Procedure: COLONOSCOPY WITH PROPOFOL;  Surgeon: Lucilla Lame, MD;  Location: [facility];  Service: Endoscopy;  Laterality: N/A;  LATEX sensitivity   IR GENERIC HISTORICAL  07/07/2016   IR VENOGRAM RENAL UNI LEFT 07/07/2016 Aletta Edouard, MD [facility]   IR  GENERIC HISTORICAL  07/07/2016   IR VENOUS SAMPLING 07/07/2016 Aletta Edouard, MD [facility]   IR GENERIC HISTORICAL  07/07/2016   IR VENOGRAM ADRENAL BI 07/07/2016 Aletta Edouard, MD [facility]   IR GENERIC HISTORICAL  07/07/2016   IR US GUIDE VASC ACCESS RIGHT 07/07/2016 Aletta Edouard, MD [facility]   IR GENERIC HISTORICAL  07/07/2016   IR ANGIOGRAM SELECTIVE EACH ADDITIONAL VESSEL 07/07/2016 Aletta Edouard, MD [facility]   IR GENERIC HISTORICAL  07/07/2016   IR VENOUS SAMPLING 07/07/2016 Aletta Edouard, MD [facility]   IR GENERIC HISTORICAL  07/07/2016   IR US GUIDE VASC ACCESS RIGHT 07/07/2016 Aletta Edouard, MD [facility]   LAPAROSCOPIC SUPRACERVICAL HYSTERECTOMY  09/27/2010   For endometriosis. Ovaries and tubes are in place.   NASAL SEPTUM SURGERY     ROBOTIC ADRENALECTOMY Left 10/27/2016   Procedure: XI ROBOTIC LEFT ADRENALECTOMY;  Surgeon: Michael Boston, MD;  Location[facility];  Service: General;  Laterality: Left;   TONSILLECTOMY     TUBAL LIGATION  2006   WISDOM TOOTH EXTRACTION      Family History  Problem Relation Age of Onset   COPD Mother    Hypotension Mother    Colon cancer Father    Emphysema Father    Cancer Father        colon   Hyperlipidemia Sister    Heart failure Paternal Uncle    Heart attack Cousin 49   Heart disease Cousin        heart attack   Breast cancer Maternal Aunt    Adrenal disorder Neg Hx     Social History  Tobacco Use   Smoking status: Never   Smokeless tobacco: Never  Substance Use Topics   Alcohol use: Yes    Comment: social (1 drink/mo)     Current Outpatient Medications:    fluticasone (FLONASE) 50 MCG/ACT nasal spray, Place 2 sprays into both nostrils daily., Disp: 48 g, Rfl: 1   levocetirizine (XYZAL) 5 MG tablet, Take 1 tablet (5 mg total) by mouth every evening., Disp: 90 tablet, Rfl: 1   lisdexamfetamine (VYVANSE) 40 MG capsule, Take 1 capsule (40 mg total) by mouth every morning., Disp: 90 capsule, Rfl: 0    metoprolol succinate (TOPROL-XL) 50 MG 24 hr tablet, Take with or immediately following a meal., Disp: 90 tablet, Rfl: 1   montelukast (SINGULAIR) 10 MG tablet, Take 1 tablet (10 mg total) by mouth at bedtime., Disp: 90 tablet, Rfl: 1   QUEtiapine (SEROQUEL) 25 MG tablet, Take 1 tablet (25 mg total) by mouth at bedtime. In place of Abilify, Disp: 90 tablet, Rfl: 1   Semaglutide-Weight Management (WEGOVY) 0.25 MG/0.5ML SOAJ, Inject 0.25 mg into the skin once a week., Disp: 2 mL, Rfl: 0   spironolactone-hydrochlorothiazide (ALDACTAZIDE) 25-25 MG tablet, Take 1 tablet by mouth daily., Disp: 90 tablet, Rfl: 1   Ubrogepant (UBRELVY) 100 MG TABS, Take 1 tablet by mouth every other day., Disp: 16 tablet, Rfl: 2  Allergies  Allergen Reactions   Ace Inhibitors Cough   Latex Itching    Gloves, condoms   Pollen Extract     I personally reviewed active problem list, medication list, allergies, family history, social history, health maintenance with the patient/caregiver today.   ROS  ***  Objective  There were no vitals filed for this visit.  There is no height or weight on file to calculate BMI.  Physical Exam ***  No results found for this or any previous visit (from the past 2160 hour(s)).   PHQ2/9:    09/04/2022   10:41 AM 07/12/2022    9:53 AM 05/10/2022   11:25 AM 02/21/2022    9:07 AM 11/22/2021    3:13 PM  Depression screen PHQ 2/9  Decreased Interest 0 0 3 0 0  Down, Depressed, Hopeless 0 0 0 0 0  PHQ - 2 Score 0 0 3 0 0  Altered sleeping 3  3 0 0  Tired, decreased energy 3  0 0 0  Change in appetite 3  3 0 0  Feeling bad or failure about yourself  0  0 0 0  Trouble concentrating 3  0 0 0  Moving slowly or fidgety/restless 0  0 0 0  Suicidal thoughts 0  0 0 0  PHQ-9 Score 12  9 0 0    phq 9 is {gen pos [id number]}   Fall Risk:    09/04/2022   10:36 AM 07/12/2022    9:53 AM 05/10/2022   11:25 AM 02/21/2022    9:07 AM 11/22/2021    3:07 PM  Fall Risk   Falls in the  past year? 0 0 0 0 0  Number falls in past yr: 0 0 0 0 0  Injury with Fall? 0 0 0 0 0  Risk for fall due to : No Fall Risks No Fall Risks No Fall Risks No Fall Risks No Fall Risks  Follow up Falls prevention discussed Falls prevention discussed Falls prevention discussed Falls prevention discussed Falls prevention discussed      Functional Status Survey:      Assessment & Plan  ***  There are no diagnoses linked to this encounter.

## 2022-09-22 ENCOUNTER — Ambulatory Visit: Payer: Medicaid Other | Admitting: Family Medicine

## 2022-09-25 DIAGNOSIS — L700 Acne vulgaris: Secondary | ICD-10-CM | POA: Diagnosis not present

## 2022-10-18 DIAGNOSIS — I10 Essential (primary) hypertension: Secondary | ICD-10-CM | POA: Diagnosis not present

## 2022-11-06 DIAGNOSIS — L700 Acne vulgaris: Secondary | ICD-10-CM | POA: Diagnosis not present

## 2022-11-07 DIAGNOSIS — F902 Attention-deficit hyperactivity disorder, combined type: Secondary | ICD-10-CM | POA: Diagnosis not present

## 2022-11-07 DIAGNOSIS — F411 Generalized anxiety disorder: Secondary | ICD-10-CM | POA: Diagnosis not present

## 2022-11-17 DIAGNOSIS — I10 Essential (primary) hypertension: Secondary | ICD-10-CM | POA: Diagnosis not present

## 2022-11-23 ENCOUNTER — Ambulatory Visit (INDEPENDENT_AMBULATORY_CARE_PROVIDER_SITE_OTHER): Payer: BC Managed Care – PPO | Admitting: Obstetrics & Gynecology

## 2022-11-23 ENCOUNTER — Encounter: Payer: Self-pay | Admitting: Obstetrics & Gynecology

## 2022-11-23 ENCOUNTER — Other Ambulatory Visit (HOSPITAL_COMMUNITY)
Admission: RE | Admit: 2022-11-23 | Discharge: 2022-11-23 | Disposition: A | Discharge status: Home / Self Care | Payer: BC Managed Care – PPO | Source: Ambulatory Visit | Attending: Obstetrics & Gynecology | Admitting: Obstetrics & Gynecology

## 2022-11-23 VITALS — BP 164/98 | HR 71 | Wt 208.0 lb

## 2022-11-23 DIAGNOSIS — Z131 Encounter for screening for diabetes mellitus: Secondary | ICD-10-CM | POA: Diagnosis not present

## 2022-11-23 DIAGNOSIS — N898 Other specified noninflammatory disorders of vagina: Secondary | ICD-10-CM | POA: Diagnosis not present

## 2022-11-23 DIAGNOSIS — Z13220 Encounter for screening for lipoid disorders: Secondary | ICD-10-CM | POA: Diagnosis not present

## 2022-11-23 DIAGNOSIS — N951 Menopausal and female climacteric states: Secondary | ICD-10-CM

## 2022-11-23 DIAGNOSIS — Z113 Encounter for screening for infections with a predominantly sexual mode of transmission: Secondary | ICD-10-CM

## 2022-11-23 DIAGNOSIS — Z01419 Encounter for gynecological examination (general) (routine) without abnormal findings: Secondary | ICD-10-CM

## 2022-11-23 DIAGNOSIS — N870 Mild cervical dysplasia: Secondary | ICD-10-CM | POA: Insufficient documentation

## 2022-11-23 DIAGNOSIS — Z1329 Encounter for screening for other suspected endocrine disorder: Secondary | ICD-10-CM | POA: Diagnosis not present

## 2022-11-23 DIAGNOSIS — N6324 Unspecified lump in the left breast, lower inner quadrant: Secondary | ICD-10-CM

## 2022-11-23 DIAGNOSIS — Z1231 Encounter for screening mammogram for malignant neoplasm of breast: Secondary | ICD-10-CM

## 2022-11-23 MED ORDER — VENLAFAXINE HCL ER 37.5 MG PO CP24: 37.5000 mg | ORAL_CAPSULE | Freq: Every day | ORAL | 3 refills | Status: DC

## 2022-11-23 MED ORDER — GABAPENTIN 600 MG PO TABS
600.0000 mg | ORAL_TABLET | Freq: Every day | ORAL | 3 refills | Status: DC
Start: 2022-11-23 — End: 2023-03-05

## 2022-11-23 NOTE — Progress Notes (Signed)
GYNECOLOGY ANNUAL PREVENTATIVE CARE ENCOUNTER NOTE  History:     Amanda Fields is a 46 y.o. G88P2002 female here for a routine annual gynecologic exam.  She has history of supracervical hysterectomy done for endometriosis in 2011.  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  Current complaints: very bothersome hot flashes, night sweats, insomnia and mood changes.  Was prescribed Seroquel, not taking this.  Wants to discuss possible estrogen therapy. Also reports vaginal odor, wants to make sure she does not have a urinary tract infection or vaginitis.  Desires annual STI screen.  Denies abnormal vaginal bleeding, discharge, irritation, pelvic pain, problems with intercourse or other gynecologic concerns.    Gynecologic History No LMP recorded. Patient has had a hysterectomy. Last Pap: 11/22/2021. Result was abnormal: ASCUS with positive HRHPV, colposcopy showed possible high grade cells and LEEP on 03/07/22 showed CIN with negative margins. Last Mammogram: 03/02/2022.  Result was benign Last Cologuard: 12/25/2021.  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 medically.   Cough due to ACE inhibitor    Dizziness    Edema leg    Elevated troponin I level 12/31/2015   Endometriosis    Hypertension    a. echo 03/2012 EF  60-65%, moderate LVH, nl LV diastolic fxn, nl PASP; b. echo 12/2015: EF 55-60%   Hypertensive urgency 12/31/2015   Migraine headache    migrianes-less frequent now   Obesity    Palpitation    Pre-diabetes 09/27/2018    Past Surgical History:  Procedure Laterality Date   COLONOSCOPY WITH PROPOFOL N/A 04/09/2017   Procedure: COLONOSCOPY WITH PROPOFOL;  Surgeon: Lucilla Lame, MD;  Location: [facility];  Service: Endoscopy;  Laterality: N/A;  LATEX sensitivity   IR GENERIC HISTORICAL  07/07/2016   IR VENOGRAM RENAL UNI LEFT 07/07/2016 Aletta Edouard, MD [facility]   IR GENERIC HISTORICAL  07/07/2016   IR VENOUS SAMPLING 07/07/2016 Aletta Edouard, MD [facility]   IR GENERIC HISTORICAL  07/07/2016   IR VENOGRAM ADRENAL BI 07/07/2016 Aletta Edouard, MD [facility]   IR GENERIC HISTORICAL  07/07/2016   IR US GUIDE VASC ACCESS RIGHT 07/07/2016 Aletta Edouard, MD [facility]   IR GENERIC HISTORICAL  07/07/2016   IR ANGIOGRAM SELECTIVE EACH ADDITIONAL VESSEL 07/07/2016 Aletta Edouard, MD [facility]   IR GENERIC HISTORICAL  07/07/2016   IR VENOUS SAMPLING 07/07/2016 Aletta Edouard, MD [facility]   IR GENERIC HISTORICAL  07/07/2016   IR US GUIDE VASC ACCESS RIGHT 07/07/2016 Aletta Edouard, MD [facility]   LAPAROSCOPIC SUPRACERVICAL HYSTERECTOMY  09/27/2010   For endometriosis. Ovaries and tubes are in place.  LEEP  03/07/2022   NASAL SEPTUM SURGERY     ROBOTIC ADRENALECTOMY Left 10/27/2016   Procedure: XI ROBOTIC LEFT ADRENALECTOMY;  Surgeon: Michael Boston, MD;  Location: [facility];  Service: General;  Laterality: Left;   TONSILLECTOMY     TUBAL LIGATION  2006   WISDOM TOOTH EXTRACTION      Current Outpatient Medications on File Prior to Visit  Medication Sig Dispense Refill   lisdexamfetamine (VYVANSE) 40 MG capsule Take 1 capsule (40 mg total) by mouth every morning. 90 capsule 0   metoprolol succinate (TOPROL-XL) 50 MG 24 hr tablet Take with or immediately following a  meal. 90 tablet 1   spironolactone-hydrochlorothiazide (ALDACTAZIDE) 25-25 MG tablet Take 1 tablet by mouth daily. 90 tablet 1   Ubrogepant (UBRELVY) 100 MG TABS Take 1 tablet by mouth every other day. 16 tablet 2   No current facility-administered medications on file prior to visit.    Allergies  Allergen Reactions   Ace Inhibitors Cough   Latex Itching    Gloves, condoms   Pollen Extract     Social History:  reports that she has never smoked. She has never used smokeless tobacco. She reports current alcohol use. She reports that she does not use drugs.  Family History  Problem Relation Age of Onset   COPD Mother    Hypotension Mother    Colon cancer Father    Emphysema Father    Cancer Father        colon   Hyperlipidemia Sister    Heart failure Paternal Uncle    Heart attack Cousin 74   Heart disease Cousin        heart attack   Breast cancer Maternal Aunt    Adrenal disorder Neg Hx     The following portions of the patient's history were reviewed and updated as appropriate: allergies, current medications, past family history, past medical history, past social history, past surgical history and problem list.  Review of Systems Pertinent items noted in HPI and remainder of comprehensive ROS otherwise negative.  Physical Exam:  BP (!) 164/98   Pulse 71   Wt 208 lb (94.3 kg)   BMI 33.57 kg/m  CONSTITUTIONAL: Well-developed, well-nourished female in no acute distress.  HENT:  Normocephalic, atraumatic, External right and left ear normal.  EYES: Conjunctivae and EOM are normal. Pupils are equal, round, and reactive to light. No scleral icterus.  NECK: Normal range of motion, supple, no masses.  Normal thyroid.  SKIN: Skin is warm and dry. No rash noted. Not diaphoretic. No erythema. No pallor. MUSCULOSKELETAL: Normal range of motion. No tenderness.  No cyanosis, clubbing, or edema. NEUROLOGIC: Alert and oriented to person, place, and time. Normal reflexes, muscle tone  coordination.  PSYCHIATRIC: Normal mood and affect. Normal behavior. Normal judgment and thought content. CARDIOVASCULAR: Normal heart rate noted, regular rhythm RESPIRATORY: Clear to auscultation bilaterally. Effort and breath sounds normal, no problems with respiration noted. BREASTS: Symmetric in size. Left breast lesion, about 1 cm in size around 8 o'clock position, 5 cm from areola border.  Mild tenderness noted.  No other masses, tenderness, skin changes, nipple drainage, or lymphadenopathy bilaterally. Performed in the presence of a chaperone. ABDOMEN: Soft, no distention noted.  No tenderness, rebound or guarding.  PELVIC: Normal appearing external genitalia and urethral meatus; normal appearing vaginal mucosa and cervix.  No abnormal vaginal discharge noted.  Pap smear obtained.  No other palpable masses, no adnexal tenderness.  Performed in the presence of a  chaperone.  No results found for this or any previous visit (from the past 24 hour(s)).    Assessment and Plan:     1. Vasomotor symptoms due to menopause Patient with bothersome menopausal vasomotor symptoms. Discussed lifestyle interventions such as wearing light clothing, remaining in cool environments, having fan/air conditioner in the room, avoiding hot beverages etc.  Discussed using hormone therapy and concerns about increased risk of heart disease and cerebrovascular disease (given her uncontrolled HTN), thromboembolic disease, and breast cancer.  Also discussed other medical options such as Paxil, Effexor, Brisdelle, Celexa or Neurontin.   Patient opted for Effexor and Neurontin for now, can increase Effexor dosage to 75 mg if needed. She will return in 2 months for reevaluation. - FSH - Estradiol - venlafaxine XR (EFFEXOR XR) 37.5 MG 24 hr capsule; Take 1 capsule (37.5 mg total) by mouth daily with breakfast.  Dispense: 30 capsule; Refill: 3 - gabapentin (NEURONTIN) 600 MG tablet; Take 1 tablet (600 mg total) by mouth at  bedtime.  Dispense: 30 tablet; Refill: 3  2. Vaginal odor Labs sent, will follow up results and manage accordingly. Proper vulvar hygiene emphasized. - Urine Culture - Cervicovaginal ancillary only( [facility])  3. Routine screening for STI (sexually transmitted infection) STI screen done, will follow up results and manage accordingly. - RPR+HBsAg+HCVAb+HIV - Cervicovaginal ancillary only( [place])  4. Mass of lower inner quadrant of left breast 5. Encounter for screening mammogram for malignant neoplasm of breast Mass was noted on scan last year, follow up scan ordered.  Will follow up results and manage accordingly. - US BREAST LTD UNI LEFT INC AXILLA; Future - MM DIAG BREAST TOMO BILATERAL; Future - MM 3D SCREEN BREAST BILATERAL; Future  6. Dysplasia of cervix, low grade (CIN 1) - Cytology - PAP done today, will follow up results and manage accordingly.  7. Well woman exam with routine gynecological exam - Cytology - PAP - CBC - Comprehensive metabolic panel - Lipid panel - Hemoglobin A1c - TSH Rfx on Abnormal to Free T4  Will follow up results of pap smear and preventative healthcare maintenance labs and manage accordingly. Colon cancer screening is up to date. Routine preventative health maintenance measures emphasized. Please refer to After Visit Summary for other counseling recommendations.      Verita Schneiders, MD, [place] [facility], [place]

## 2022-11-24 LAB — COMPREHENSIVE METABOLIC PANEL
ALT: 18 IU/L (ref 0–32)
AST: 24 IU/L (ref 0–40)
Albumin/Globulin Ratio: 1.5 (ref 1.2–2.2)
Albumin: 4.3 g/dL (ref 3.9–4.9)
Alkaline Phosphatase: 51 IU/L (ref 44–121)
BUN/Creatinine Ratio: 8 — ABNORMAL LOW (ref 9–23)
BUN: 10 mg/dL (ref 6–24)
Bilirubin Total: 0.5 mg/dL (ref 0.0–1.2)
CO2: 23 mmol/L (ref 20–29)
Calcium: 9.4 mg/dL (ref 8.7–10.2)
Chloride: 103 mmol/L (ref 96–106)
Creatinine, Ser: 1.21 mg/dL — ABNORMAL HIGH (ref 0.57–1.00)
Globulin, Total: 2.8 g/dL (ref 1.5–4.5)
Glucose: 103 mg/dL — ABNORMAL HIGH (ref 70–99)
Potassium: 4.2 mmol/L (ref 3.5–5.2)
Sodium: 141 mmol/L (ref 134–144)
Total Protein: 7.1 g/dL (ref 6.0–8.5)
eGFR: 55 mL/min/{1.73_m2} — ABNORMAL LOW (ref >59)

## 2022-11-24 LAB — HEMOGLOBIN A1C
Est. average glucose Bld gHb Est-mCnc: 131 mg/dL
Hgb A1c MFr Bld: 6.2 % — ABNORMAL HIGH (ref 4.8–5.6)

## 2022-11-24 LAB — LIPID PANEL
Chol/HDL Ratio: 4.3 ratio (ref 0.0–4.4)
Cholesterol, Total: 196 mg/dL (ref 100–199)
HDL: 46 mg/dL (ref >39)
LDL Chol Calc (NIH): 133 mg/dL — ABNORMAL HIGH (ref 0–99)
Triglycerides: 93 mg/dL (ref 0–149)
VLDL Cholesterol Cal: 17 mg/dL (ref 5–40)

## 2022-11-24 LAB — CBC
Hematocrit: 44.2 % (ref 34.0–46.6)
Hemoglobin: 14 g/dL (ref 11.1–15.9)
MCH: 26.5 pg — ABNORMAL LOW (ref 26.6–33.0)
MCHC: 31.7 g/dL (ref 31.5–35.7)
MCV: 84 fL (ref 79–97)
Platelets: 279 10*3/uL (ref 150–450)
RBC: 5.28 x10E6/uL (ref 3.77–5.28)
RDW: 14.2 % (ref 11.7–15.4)
WBC: 5.1 10*3/uL (ref 3.4–10.8)

## 2022-11-24 LAB — CERVICOVAGINAL ANCILLARY ONLY
Bacterial Vaginitis (gardnerella): NEGATIVE
Candida Glabrata: NEGATIVE
Candida Vaginitis: NEGATIVE
Chlamydia: NEGATIVE
Comment: NEGATIVE
Comment: NEGATIVE
Comment: NEGATIVE
Comment: NEGATIVE
Comment: NEGATIVE
Comment: NORMAL
Neisseria Gonorrhea: NEGATIVE
Trichomonas: NEGATIVE

## 2022-11-24 LAB — RPR+HBSAG+HCVAB+...
HIV Screen 4th Generation wRfx: NONREACTIVE
Hep C Virus Ab: NONREACTIVE
Hepatitis B Surface Ag: NEGATIVE
RPR Ser Ql: NONREACTIVE

## 2022-11-24 LAB — ESTRADIOL: Estradiol: 33.8 pg/mL

## 2022-11-24 LAB — FOLLICLE STIMULATING HORMONE: FSH: 40.3 m[IU]/mL

## 2022-11-24 LAB — TSH RFX ON ABNORMAL TO FREE T4: TSH: 2.07 u[IU]/mL (ref 0.450–4.500)

## 2022-11-25 LAB — URINE CULTURE

## 2022-11-30 ENCOUNTER — Telehealth: Payer: Self-pay | Admitting: *Deleted

## 2022-11-30 LAB — CYTOLOGY - PAP
Comment: NEGATIVE
Comment: NEGATIVE
Comment: NEGATIVE
HPV 16: NEGATIVE
HPV 18 / 45: NEGATIVE
High risk HPV: POSITIVE — AB

## 2022-11-30 NOTE — Telephone Encounter (Signed)
Left message for pt to call back to go over pap results

## 2022-12-01 NOTE — Progress Notes (Unsigned)
Name: Amanda Fields   MRN: [id number]    DOB: [birth year]   Date:12/04/2022       Progress Note  Subjective  Chief Complaint  Follow Up  HPI  History of secondary hypertension, currently essential HTN : she had adrenalectomy in 2017  She is now on Metoprolol because of palpitation, and spironalactone, hctz and is doing well, bp today is borderline .   Pre-diabetes : A1C  is trending up from 6 to 6.2 % , discussed cutting down on sweet beverages   ADHD: diagnosed by previous pcp, Dr. Manuella Ghazi, stable on Vyvanse, reminded her it is a controlled medication  Obesity:her weight was stable in the low 220's , had COVID -19 in Nov 2021 and since than she lost over 15 lbs. It stayed between 214-217 lbs for a long time, but is trending down , today is 205.8 lbs. Avoiding sweets , still drinking juices but will try to cut that off also. . She is drinking water and unsweetened green tea. Discussed intermittent fasting She has not been able to fill rx of Wegovy, discussed trying zepbound   Dyslipidemia: last LDL was high at 154, discussed healthy diet   The 10-year ASCVD risk score (Arnett DK, et al., 2019) is: 5.3%   Values used to calculate the score:     Age: 46 years     Sex: Female     Is Non-Hispanic African American: Yes     Diabetic: No     Tobacco smoker: No     Systolic Blood Pressure: 702 mmHg     Is BP treated: Yes     HDL Cholesterol: 47 mg/dL     Total Cholesterol: 216 mg/dL   Major Depression and Anxiety: She found a temporary job through [employer] and is working from home since 08/21  [employer] and [employer] intake she is now a full time employee - since April 2023. Daughter moved out with her grand-daughter August 2023. Stress has decreased and she  stopped taking Lexapro months ago, her gyn just started her on Effexor 37.5 mg last week. She has a therapist   Hypercholesterolemia:   The 10-year ASCVD risk score (Arnett DK, et al., 2019) is: 4.1%   Values used to calculate the  score:     Age: 46 years     Sex: Female     Is Non-Hispanic African American: Yes     Diabetic: No     Tobacco smoker: No     Systolic Blood Pressure: 637 mmHg     Is BP treated: Yes     HDL Cholesterol: 46 mg/dL     Total Cholesterol: 196 mg/dL   Migraines: It is described as sharp, nagging, pulsating and started on her nuchal area, and radiates to her entire head, sometimes has nausea and vomiting. Also has phonophobia and photophobia  She states episodes are sporadic, she takes [place] prn She is having 5 episodes per month on average , discussed preventive medication but not interested   Abnormal pap smear: pap smear done 11/2021 positive high risk HPV but negative for 16/18/45 types. ASCUS ,  she had a repeat pap 11/2022 that showed low grade intraepithelial lesion and will go back for a colposcopy   Adult acne: seeing Dr. Phillip Heal, taking doxy currently and some topical medication.   Insomnia: she has difficulty falling and staying asleep, symptoms started over 5 years ago, she has tried multiple medications, she had sleep study in the past, tried Ambien ,  Trazodone, benadryl  and melatonin.  She is now off Seroquel and taking Gabapentin given by her gyn   Perennial allergic rhinitis: she is taking xyzal otc and seems to be helping with symptoms, stopped singulair   Patient Active Problem List   Diagnosis Date Noted   Perennial allergic rhinitis with seasonal variation 09/04/2022   Dyslipidemia 09/04/2022   Vitamin D deficiency 09/04/2022   Migraine without aura and with status migrainosus, not intractable 09/04/2022   Insomnia, persistent 09/04/2022   Major depression, recurrent, chronic ([place]) 09/04/2022   Skin tags, multiple acquired 07/12/2022   ASCUS with positive high risk HPV cervical on 11/22/2021 11/28/2021   Status post laparoscopic supracervical hysterectomy for endometriosis 11/22/2021   Acne vulgaris 06/13/2021   Other hypertrophic disorders of the skin 06/13/2021    Dysplasia of cervix, low grade (CIN 1) 12/15/2020   Chronic rhinitis 08/05/2020   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[place]) 02/06/2020   Cervical dysplasia 11/24/2019   Hypertension due to endocrine disorder 12/10/2018   Pre-diabetes 09/27/2018   Prolapsed internal hemorrhoids, grade 2 08/28/2018   History of endometriosis 04/10/2018   GAD (generalized anxiety disorder) 04/10/2018   Mild recurrent major depression ([place]) 04/10/2018   Obesity (BMI 30.0-34.9) 04/10/2018   ADHD (attention deficit hyperactivity disorder), 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   Palpitation 07/12/2017   Family history of malignant neoplasm of gastrointestinal tract    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   Allergic rhinitis 02/04/2016   Bilateral leg edema 04/09/2012   HTN (hypertension), benign     Past Surgical History:  Procedure Laterality Date   COLONOSCOPY WITH PROPOFOL N/A 04/09/2017   Procedure: COLONOSCOPY WITH PROPOFOL;  Surgeon: Lucilla Lame, MD;  Location: [facility];  Service: Endoscopy;  Laterality: N/A;  LATEX sensitivity   IR GENERIC HISTORICAL  07/07/2016   IR VENOGRAM RENAL UNI LEFT 07/07/2016 Aletta Edouard, MD [facility]   IR GENERIC HISTORICAL  07/07/2016   IR VENOUS SAMPLING 07/07/2016 Aletta Edouard, MD [facility]   IR GENERIC HISTORICAL  07/07/2016   IR VENOGRAM ADRENAL BI 07/07/2016 Aletta Edouard, MD [facility]   IR GENERIC HISTORICAL  07/07/2016   IR US GUIDE VASC ACCESS RIGHT 07/07/2016 Aletta Edouard, MD [facility]   IR GENERIC HISTORICAL  07/07/2016   IR ANGIOGRAM SELECTIVE EACH ADDITIONAL VESSEL 07/07/2016 Aletta Edouard, MD [facility]   IR GENERIC HISTORICAL  07/07/2016   IR VENOUS SAMPLING 07/07/2016 Aletta Edouard, MD [facility]   IR GENERIC HISTORICAL  07/07/2016   IR US GUIDE VASC ACCESS RIGHT 07/07/2016 Aletta Edouard, MD [facility]   LAPAROSCOPIC SUPRACERVICAL HYSTERECTOMY  09/27/2010   For endometriosis. Ovaries and tubes are in place.   LEEP  03/07/2022   NASAL  SEPTUM SURGERY     ROBOTIC ADRENALECTOMY Left 10/27/2016   Procedure: XI ROBOTIC LEFT ADRENALECTOMY;  Surgeon: Michael Boston, MD;  Location: [facility];  Service: General;  Laterality: Left;   TONSILLECTOMY     TUBAL LIGATION  2006   WISDOM TOOTH EXTRACTION      Family History  Problem Relation Age of Onset   COPD Mother    Hypotension Mother    Colon cancer Father    Emphysema Father    Cancer Father        colon   Hyperlipidemia Sister    Heart failure Paternal Uncle    Heart attack Cousin 24   Heart disease Cousin        heart attack   Breast cancer Maternal Aunt    Adrenal  disorder Neg Hx     Social History   Tobacco Use   Smoking status: Never   Smokeless tobacco: Never  Substance Use Topics   Alcohol use: Yes    Comment: social (1 drink/mo)     Current Outpatient Medications:    doxycycline (VIBRAMYCIN) 100 MG capsule, Take 100 mg by mouth daily., Disp: , Rfl:    gabapentin (NEURONTIN) 600 MG tablet, Take 1 tablet (600 mg total) by mouth at bedtime., Disp: 30 tablet, Rfl: 3   metoprolol succinate (TOPROL-XL) 50 MG 24 hr tablet, Take with or immediately following a meal., Disp: 90 tablet, Rfl: 1   spironolactone-hydrochlorothiazide (ALDACTAZIDE) 25-25 MG tablet, Take 1 tablet by mouth daily., Disp: 90 tablet, Rfl: 1   Ubrogepant (UBRELVY) 100 MG TABS, Take 1 tablet by mouth every other day., Disp: 16 tablet, Rfl: 2   venlafaxine XR (EFFEXOR XR) 37.5 MG 24 hr capsule, Take 1 capsule (37.5 mg total) by mouth daily with breakfast., Disp: 30 capsule, Rfl: 3   lisdexamfetamine (VYVANSE) 40 MG capsule, Take 1 capsule (40 mg total) by mouth every morning., Disp: 90 capsule, Rfl: 0   Tirzepatide-Weight Management (ZEPBOUND) 2.5 MG/0.5ML SOAJ, Inject 2.5 mg into the skin once a week., Disp: 2 mL, Rfl: 0  Allergies  Allergen Reactions   Ace Inhibitors Cough   Latex Itching    Gloves, condoms   Pollen Extract     I personally reviewed active problem list, medication list,  allergies, family history, social history, health maintenance with the patient/caregiver today.   ROS  Ten systems reviewed and is negative except as mentioned in HPI   Objective  Vitals:   12/04/22 0817  BP: 130/82  Pulse: 96  Resp: 14  Temp: 98.4 F (36.9 C)  TempSrc: Oral  SpO2: 97%  Weight: 205 lb 12.8 oz (93.4 kg)  Height: 5' 6.75" (1.695 m)    Body mass index is 32.47 kg/m.  Physical Exam  Constitutional: Patient appears well-developed and well-nourished. Obese  No distress.  HEENT: head atraumatic, normocephalic, pupils equal and reactive to light,, neck supple Cardiovascular: Normal rate, regular rhythm and normal heart sounds.  No murmur heard. No BLE edema. Pulmonary/Chest: Effort normal and breath sounds normal. No respiratory distress. Abdominal: Soft.  There is no tenderness. Psychiatric: Patient has a normal mood and affect. behavior is normal. Judgment and thought content normal.   Recent Results (from the past 2160 hour(s))  Urine Culture     Status: None   Collection Time: 11/23/22  9:00 AM   Specimen: Vein; Blood   UR  Result Value Ref Range   Urine Culture, Routine Final report    Organism ID, Bacteria Comment     Comment: Mixed urogenital flora Less than 10,000 colonies/mL   Cytology - PAP     Status: Abnormal   Collection Time: 11/23/22  9:02 AM  Result Value Ref Range   High risk HPV Positive (A)    HPV 16 Negative    HPV 18 / 45 Negative    Adequacy      Satisfactory for evaluation; transformation zone component PRESENT.   Diagnosis - Low grade squamous intraepithelial lesion (LSIL) (A)    Comment Normal Reference Range HPV - Negative    Comment Normal Reference Range HPV 16- Negative    Comment Normal Reference Range HPV 16 18 45 -Negative   Cervicovaginal ancillary only( [place])     Status: None   Collection Time: 11/23/22  9:02 AM  Result  Value Ref Range   Neisseria Gonorrhea Negative    Chlamydia Negative    Trichomonas  Negative    Bacterial Vaginitis (gardnerella) Negative    Candida Vaginitis Negative    Candida Glabrata Negative    Comment Normal Reference Range Candida Species - Negative    Comment Normal Reference Range Candida Galbrata - Negative    Comment Normal Reference Range Trichomonas - Negative    Comment      Normal Reference Range Bacterial Vaginosis - Negative   Comment Normal Reference [place] Chlamydia - Negative    Comment      Normal Reference Range Neisseria Gonorrhea - Negative  CBC     Status: Abnormal   Collection Time: 11/23/22  9:12 AM  Result Value Ref Range   WBC 5.1 3.4 - 10.8 x10E3/uL   RBC 5.28 3.77 - 5.28 x10E6/uL   Hemoglobin 14.0 11.1 - 15.9 g/dL   Hematocrit 44.2 34.0 - 46.6 %   MCV 84 79 - 97 fL   MCH 26.5 (L) 26.6 - 33.0 pg   MCHC 31.7 31.5 - 35.7 g/dL   RDW 14.2 11.7 - 15.4 %   Platelets 279 150 - 450 x10E3/uL  Comprehensive metabolic panel     Status: Abnormal   Collection Time: 11/23/22  9:12 AM  Result Value Ref Range   Glucose 103 (H) 70 - 99 mg/dL   BUN 10 6 - 24 mg/dL   Creatinine, Ser 1.21 (H) 0.57 - 1.00 mg/dL   eGFR 55 (L) >59 mL/min/1.73   BUN/Creatinine Ratio 8 (L) 9 - 23   Sodium 141 134 - 144 mmol/L   Potassium 4.2 3.5 - 5.2 mmol/L   Chloride 103 96 - 106 mmol/L   CO2 23 20 - 29 mmol/L   Calcium 9.4 8.7 - 10.2 mg/dL   Total Protein 7.1 6.0 - 8.5 g/dL   Albumin 4.3 3.9 - 4.9 g/dL   Globulin, Total 2.8 1.5 - 4.5 g/dL   Albumin/Globulin Ratio 1.5 1.2 - 2.2   Bilirubin Total 0.5 0.0 - 1.2 mg/dL   Alkaline Phosphatase 51 44 - 121 IU/L   AST 24 0 - 40 IU/L   ALT 18 0 - 32 IU/L  Lipid panel     Status: Abnormal   Collection Time: 11/23/22  9:12 AM  Result Value Ref Range   Cholesterol, Total 196 100 - 199 mg/dL   Triglycerides 93 0 - 149 mg/dL   HDL 46 >39 mg/dL   VLDL Cholesterol Cal 17 5 - 40 mg/dL   LDL Chol Calc (NIH) 133 (H) 0 - 99 mg/dL   Chol/HDL Ratio 4.3 0.0 - 4.4 ratio    Comment:                                   T. Chol/HDL  Ratio                                             Men  Women                               1/2 Avg.Risk  3.4    3.3  Avg.Risk  5.0    4.4                                2X Avg.Risk  9.6    7.1                                3X Avg.Risk 23.4   11.0   Hemoglobin A1c     Status: Abnormal   Collection Time: 11/23/22  9:12 AM  Result Value Ref Range   Hgb A1c MFr Bld 6.2 (H) 4.8 - 5.6 %    Comment:          Prediabetes: 5.7 - 6.4          Diabetes: >6.4          Glycemic control for adults with diabetes: <7.0    Est. average glucose Bld gHb Est-mCnc 131 mg/dL  RPR+HBsAg+HCVAb+...     Status: None   Collection Time: 11/23/22  9:12 AM  Result Value Ref Range   Hepatitis B Surface Ag Negative Negative   Hep C Virus Ab Non Reactive Non Reactive    Comment: HCV antibody alone does not differentiate between previously resolved infection and active infection. Equivocal and Reactive HCV antibody results should be followed up with an HCV RNA test to support the diagnosis of active HCV infection.    RPR Ser Ql Non Reactive Non Reactive   HIV Screen 4th Generation wRfx Non Reactive Non Reactive    Comment: HIV Negative HIV-1/HIV-2 antibodies and HIV-1 p24 antigen were NOT detected. There is no laboratory evidence of HIV infection.   [place]     Status: None   Collection Time: 11/23/22  9:12 AM  Result Value Ref Range   FSH 40.3 mIU/mL    Comment:                      Adult Female             Range                       Follicular phase      3.5 -  12.5                       Ovulation phase       4.7 -  21.5                       Luteal phase          1.7 -   7.7                       Postmenopausal       25.8 - 134.8   Estradiol     Status: None   Collection Time: 11/23/22  9:12 AM  Result Value Ref Range   Estradiol 33.8 pg/mL    Comment:                      Adult Female             Range                       Follicular phase     96.2 - 166.0  Ovulation phase      85.8 - 498.0                       Luteal phase         43.8 - 211.0                       Postmenopausal       <6.0 -  54.7                      Pregnancy                       1st trimester     215.0 - >4300.0 Roche ECLIA methodology   TSH Rfx on Abnormal to Free T4     Status: None   Collection Time: 11/23/22  9:29 AM  Result Value Ref Range   TSH 2.070 0.450 - 4.500 uIU/mL    PHQ2/9:    12/04/2022    8:19 AM 09/04/2022   10:41 AM 07/12/2022    9:53 AM 05/10/2022   11:25 AM 02/21/2022    9:07 AM  Depression screen PHQ 2/9  Decreased Interest 0 0 0 3 0  Down, Depressed, Hopeless 0 0 0 0 0  PHQ - 2 Score 0 0 0 3 0  Altered sleeping 0 3  3 0  Tired, decreased energy 0 3  0 0  Change in appetite 0 3  3 0  Feeling bad or failure about yourself  0 0  0 0  Trouble concentrating 0 3  0 0  Moving slowly or fidgety/restless 0 0  0 0  Suicidal thoughts 0 0  0 0  PHQ-9 Score 0 12  9 0    phq 9 is negative   Fall Risk:    12/04/2022    8:19 AM 09/04/2022   10:36 AM 07/12/2022    9:53 AM 05/10/2022   11:25 AM 02/21/2022    9:07 AM  Fall Risk   Falls in the past year? 0 0 0 0 0  Number falls in past yr:  0 0 0 0  Injury with Fall?  0 0 0 0  Risk for fall due to : _0   Follow up Falls prevention discussed;Education provided;Falls evaluation completed Falls prevention discussed Falls prevention discussed Falls prevention discussed Falls prevention discussed      Functional Status Survey: Is the patient deaf or have difficulty hearing?: No Does the patient have difficulty seeing, even when wearing glasses/contacts?: Yes Does the patient have difficulty concentrating, remembering, or making decisions?: No Does the patient have difficulty walking or climbing stairs?: Yes Does the patient have difficulty dressing or bathing?: No Does the patient have difficulty doing errands alone such  as visiting a doctor's office or shopping?: No    Assessment & Plan  1. Major depression, recurrent, chronic (HCC)  Continue effexor   2. ADHD (attention deficit hyperactivity disorder), inattentive type  - lisdexamfetamine (VYVANSE) 40 MG capsule; Take 1 capsule (40 mg total) by mouth every morning.  Dispense: 90 capsule; Refill: 0  3. HTN (hypertension), benign  At goal   4. Dyslipidemia  Reviewed labs   5. Insomnia, persistent  On gabapentin now   6. Pre-diabetes  Discussed low carb diet  7. Obesity (BMI 30.0-34.9)  Since unable to get [facility] -  Tirzepatide-Weight Management (ZEPBOUND) 2.5 MG/0.5ML SOAJ; Inject 2.5 mg into the skin once a week.  Dispense: 2 mL; Refill: 0  8. Vitamin D deficiency   9. Migraine without aura and with status migrainosus, not intractable

## 2022-12-04 ENCOUNTER — Encounter: Payer: Self-pay | Admitting: Family Medicine

## 2022-12-04 ENCOUNTER — Ambulatory Visit
Admission: RE | Admit: 2022-12-04 | Discharge: 2022-12-04 | Disposition: A | Discharge status: Home / Self Care | Payer: Medicaid Other | Source: Ambulatory Visit | Attending: Obstetrics & Gynecology | Admitting: Obstetrics & Gynecology

## 2022-12-04 ENCOUNTER — Ambulatory Visit: Payer: BC Managed Care – PPO | Admitting: Family Medicine

## 2022-12-04 VITALS — BP 130/82 | HR 96 | Temp 98.4°F | Resp 14 | Ht 66.75 in | Wt 205.8 lb

## 2022-12-04 DIAGNOSIS — E559 Vitamin D deficiency, unspecified: Secondary | ICD-10-CM

## 2022-12-04 DIAGNOSIS — F339 Major depressive disorder, recurrent, unspecified: Secondary | ICD-10-CM

## 2022-12-04 DIAGNOSIS — G4700 Insomnia, unspecified: Secondary | ICD-10-CM

## 2022-12-04 DIAGNOSIS — R7303 Prediabetes: Secondary | ICD-10-CM

## 2022-12-04 DIAGNOSIS — E785 Hyperlipidemia, unspecified: Secondary | ICD-10-CM

## 2022-12-04 DIAGNOSIS — F900 Attention-deficit hyperactivity disorder, predominantly inattentive type: Secondary | ICD-10-CM | POA: Diagnosis not present

## 2022-12-04 DIAGNOSIS — E66811 Obesity, class 1: Secondary | ICD-10-CM

## 2022-12-04 DIAGNOSIS — G43001 Migraine without aura, not intractable, with status migrainosus: Secondary | ICD-10-CM

## 2022-12-04 DIAGNOSIS — N6324 Unspecified lump in the left breast, lower inner quadrant: Secondary | ICD-10-CM

## 2022-12-04 DIAGNOSIS — I10 Essential (primary) hypertension: Secondary | ICD-10-CM

## 2022-12-04 DIAGNOSIS — E669 Obesity, unspecified: Secondary | ICD-10-CM | POA: Diagnosis not present

## 2022-12-04 MED ORDER — ZEPBOUND 2.5 MG/0.5ML ~~LOC~~ SOAJ: 2.5000 mg | SUBCUTANEOUS | 0 refills | Status: DC

## 2022-12-04 MED ORDER — LISDEXAMFETAMINE DIMESYLATE 40 MG PO CAPS: 40.0000 mg | ORAL_CAPSULE | ORAL | 0 refills | Status: DC

## 2023-01-03 ENCOUNTER — Telehealth: Payer: Self-pay | Admitting: Family Medicine

## 2023-01-03 NOTE — Telephone Encounter (Signed)
Copied from [place] [phone]. Topic: General - Inquiry >> Jan 02, 2023  4:34 PM Erskine Squibb wrote: Reason for CRM: Micole with [employer] called In stating she is going to fax over a form to be siogned so the patient can get her lisdexamfetamine (VYVANSE) 40 MG capsule as the patient is just about out. Please assist patient as soon as possible.

## 2023-01-09 ENCOUNTER — Encounter: Payer: Self-pay | Admitting: Obstetrics & Gynecology

## 2023-01-09 ENCOUNTER — Ambulatory Visit (INDEPENDENT_AMBULATORY_CARE_PROVIDER_SITE_OTHER): Payer: BC Managed Care – PPO | Admitting: Obstetrics & Gynecology

## 2023-01-09 ENCOUNTER — Other Ambulatory Visit (HOSPITAL_COMMUNITY)
Admission: RE | Admit: 2023-01-09 | Discharge: 2023-01-09 | Disposition: A | Discharge status: Home / Self Care | Payer: BC Managed Care – PPO | Source: Ambulatory Visit | Attending: Obstetrics & Gynecology | Admitting: Obstetrics & Gynecology

## 2023-01-09 VITALS — BP 153/101 | HR 76 | Wt 209.2 lb

## 2023-01-09 DIAGNOSIS — B977 Papillomavirus as the cause of diseases classified elsewhere: Secondary | ICD-10-CM | POA: Diagnosis not present

## 2023-01-09 DIAGNOSIS — N870 Mild cervical dysplasia: Secondary | ICD-10-CM | POA: Diagnosis not present

## 2023-01-09 DIAGNOSIS — R87810 Cervical high risk human papillomavirus (HPV) DNA test positive: Secondary | ICD-10-CM | POA: Diagnosis not present

## 2023-01-09 DIAGNOSIS — R87612 Low grade squamous intraepithelial lesion on cytologic smear of cervix (LGSIL): Secondary | ICD-10-CM | POA: Diagnosis not present

## 2023-01-09 DIAGNOSIS — N72 Inflammatory disease of cervix uteri: Secondary | ICD-10-CM | POA: Diagnosis not present

## 2023-01-09 DIAGNOSIS — Z90711 Acquired absence of uterus with remaining cervical stump: Secondary | ICD-10-CM

## 2023-01-09 NOTE — Patient Instructions (Signed)
COLPOSCOPY POST-PROCEDURE INSTRUCTIONS  You may take Ibuprofen, Aleve or Tylenol for cramping if needed.  If Monsel's solution was used, you will have a black discharge.  Light bleeding is normal.  If bleeding is heavier than your period, please call.  Put nothing in your vagina until the bleeding or discharge stops (usually 2 or 3 days).  We will call you within one week with biopsy results or discuss the results at your follow-up appointment if needed.

## 2023-01-09 NOTE — Progress Notes (Signed)
    GYNECOLOGY OFFICE COLPOSCOPY PROCEDURE NOTE  47 y.o. [id number] here for colposcopy for low-grade squamous intraepithelial neoplasia (LGSIL - encompassing HPV,mild dysplasia,CIN I) pap smear on 11/23/2022. Discussed role for HPV in cervical dysplasia, need for surveillance.  Patient gave informed written consent, time out was performed.  Placed in lithotomy position. Cervix viewed with speculum and colposcope after application of acetic acid.   Colposcopy adequate? Yes [place]et[place]hite lesion noted at 6 o'clock; corresponding biopsies obtained.  ECC specimen obtained.  All specimens were labeled and sent to pathology.  Chaperone was present during entire procedure.  Patient was given post procedure instructions.  Will follow up pathology and manage accordingly; patient will be contacted with results and recommendations.  Routine preventative health maintenance measures emphasized.    Of note, patient was noted to have elevated BP. Has history of HTN and is on antihypertensives. Advised to follow up with PCP.     Verita Schneiders, MD, [place] for [employer], [place]

## 2023-01-11 ENCOUNTER — Encounter: Payer: Self-pay | Admitting: Family Medicine

## 2023-01-11 LAB — SURGICAL PATHOLOGY

## 2023-01-11 NOTE — Telephone Encounter (Signed)
Micole S. Called back again stating that she has yet to receive correspondence regarding her latest fax. Please advise

## 2023-01-11 NOTE — Telephone Encounter (Signed)
Called [employer] to get form. I have printed and filled out. Faxed today.

## 2023-01-17 DIAGNOSIS — N760 Acute vaginitis: Secondary | ICD-10-CM | POA: Diagnosis not present

## 2023-01-22 ENCOUNTER — Encounter: Payer: Self-pay | Admitting: Obstetrics & Gynecology

## 2023-02-08 ENCOUNTER — Encounter: Payer: Self-pay | Admitting: Family Medicine

## 2023-02-09 ENCOUNTER — Other Ambulatory Visit: Payer: Self-pay

## 2023-02-09 DIAGNOSIS — L709 Acne, unspecified: Secondary | ICD-10-CM

## 2023-02-09 DIAGNOSIS — L659 Nonscarring hair loss, unspecified: Secondary | ICD-10-CM

## 2023-02-16 ENCOUNTER — Other Ambulatory Visit: Payer: Self-pay | Admitting: Family Medicine

## 2023-02-16 DIAGNOSIS — F339 Major depressive disorder, recurrent, unspecified: Secondary | ICD-10-CM

## 2023-02-16 DIAGNOSIS — G4700 Insomnia, unspecified: Secondary | ICD-10-CM

## 2023-03-05 ENCOUNTER — Ambulatory Visit (INDEPENDENT_AMBULATORY_CARE_PROVIDER_SITE_OTHER): Payer: BC Managed Care – PPO | Admitting: Family Medicine

## 2023-03-05 ENCOUNTER — Encounter: Payer: Self-pay | Admitting: Family Medicine

## 2023-03-05 VITALS — BP 116/78 | HR 82 | Resp 16 | Ht 66.0 in | Wt 213.0 lb

## 2023-03-05 DIAGNOSIS — E669 Obesity, unspecified: Secondary | ICD-10-CM | POA: Diagnosis not present

## 2023-03-05 DIAGNOSIS — E785 Hyperlipidemia, unspecified: Secondary | ICD-10-CM

## 2023-03-05 DIAGNOSIS — G4700 Insomnia, unspecified: Secondary | ICD-10-CM

## 2023-03-05 DIAGNOSIS — R944 Abnormal results of kidney function studies: Secondary | ICD-10-CM

## 2023-03-05 DIAGNOSIS — J3089 Other allergic rhinitis: Secondary | ICD-10-CM | POA: Diagnosis not present

## 2023-03-05 DIAGNOSIS — G43001 Migraine without aura, not intractable, with status migrainosus: Secondary | ICD-10-CM

## 2023-03-05 DIAGNOSIS — R002 Palpitations: Secondary | ICD-10-CM

## 2023-03-05 DIAGNOSIS — F339 Major depressive disorder, recurrent, unspecified: Secondary | ICD-10-CM | POA: Diagnosis not present

## 2023-03-05 DIAGNOSIS — N870 Mild cervical dysplasia: Secondary | ICD-10-CM

## 2023-03-05 DIAGNOSIS — E66811 Obesity, class 1: Secondary | ICD-10-CM

## 2023-03-05 DIAGNOSIS — J302 Other seasonal allergic rhinitis: Secondary | ICD-10-CM

## 2023-03-05 DIAGNOSIS — E896 Postprocedural adrenocortical (-medullary) hypofunction: Secondary | ICD-10-CM | POA: Diagnosis not present

## 2023-03-05 DIAGNOSIS — R7303 Prediabetes: Secondary | ICD-10-CM

## 2023-03-05 DIAGNOSIS — F411 Generalized anxiety disorder: Secondary | ICD-10-CM | POA: Diagnosis not present

## 2023-03-05 DIAGNOSIS — I10 Essential (primary) hypertension: Secondary | ICD-10-CM | POA: Diagnosis not present

## 2023-03-05 DIAGNOSIS — E559 Vitamin D deficiency, unspecified: Secondary | ICD-10-CM | POA: Diagnosis not present

## 2023-03-05 DIAGNOSIS — N951 Menopausal and female climacteric states: Secondary | ICD-10-CM

## 2023-03-05 DIAGNOSIS — F900 Attention-deficit hyperactivity disorder, predominantly inattentive type: Secondary | ICD-10-CM | POA: Diagnosis not present

## 2023-03-05 MED ORDER — AMPHETAMINE-DEXTROAMPHET ER 20 MG PO CP24: 20.0000 mg | ORAL_CAPSULE | Freq: Every day | ORAL | 0 refills | Status: DC

## 2023-03-05 MED ORDER — SPIRONOLACTONE-HCTZ 25-25 MG PO TABS: 1.0000 | ORAL_TABLET | Freq: Every day | ORAL | 1 refills | Status: DC

## 2023-03-05 MED ORDER — METOPROLOL SUCCINATE ER 50 MG PO TB24: ORAL_TABLET | ORAL | 1 refills | Status: DC

## 2023-03-05 NOTE — Progress Notes (Signed)
Name: Amanda Fields   MRN: [id number]    DOB: 10-06-1976   Date:03/05/2023       Progress Note  Subjective  Chief Complaint  Chief Complaint  Patient presents with   Follow-up    HPI  History of secondary hypertension, currently essential HTN : she had adrenalectomy in 2017  She is now on Metoprolol because of palpitation, also on  spironolactone-hctz and today bp is at goal  .   Pre-diabetes : last A1C was done in Dec and it was 6.2 %  , discussed importance of avoiding sweets   ADHD: diagnosed by previous pcp, Dr. Manuella Ghazi, she was on Vyvanse, however there is nationwide shortage of generic form and the brand type is too expensive . We will try switching to Adderal today, she states unable to focus without medication, mind is always wondering and really would like to try something else. Discussed importance of starting at lower dose and returning in one month for bp check and see how she is responding   Obesity: her weight was stable in the low 220's , had COVID -19 in Nov 2021 and since than she lost over 15 lbs. It stayed between 214-217 lbs for a long time, after that she started to trend down and it was is 205.8 lbs. Avoiding sweets , still drinking juices but will try to cut that off also. . She is drinking water and unsweetened green tea. She has been going to the gym and although her weight is up she states her clothes are fitting better - loser   Dyslipidemia: last LDL was high at 154, discussed healthy diet   The 10-year ASCVD risk score (Arnett DK, et al., 2019) is: 2.7%   Values used to calculate the score:     Age: 3 years     Sex: Female     Is Non-Hispanic African American: Yes     Diabetic: No     Tobacco smoker: No     Systolic Blood Pressure: 99991111 mmHg     Is BP treated: Yes     HDL Cholesterol: 46 mg/dL     Total Cholesterol: 196 mg/dL   Major Depression and Anxiety: She found a temporary job through [employer] and is working from home since 08/21  [employer] and  [employer] intake she is now a full time employee - since April 2023. Daughter moved out with her grand-daughter August 2023. Stress has decreased and she  stopped taking Lexapro months ago, her gyn gave her Effexor Fall 2023 however she never started taking it. She has vasomotor symptoms and also depression, advised her to try it and discuss it with me next week.   Hypercholesterolemia:   The 10-year ASCVD risk score (Arnett DK, et al., 2019) is: 4.1%   Values used to calculate the score:     Age: 86 years     Sex: Female     Is Non-Hispanic African American: Yes     Diabetic: No     Tobacco smoker: No     Systolic Blood Pressure: [id number]     Is BP treated: Yes     HDL Cholesterol: 46 mg/dL     Total Cholesterol: 196 mg/dL   Migraines: It is described as sharp, nagging, pulsating and started on her nuchal area, and radiates to her entire head, sometimes has nausea and vomiting. Also has phonophobia and photophobia  She states episodes are sporadic, she takes ubrelvy prn She is having 5  episodes per month on average , discussed preventive medication but not interested   Abnormal pap smear: pap smear done 11/2021 positive high risk HPV but negative for 16/18/45 types. ASCUS ,  she had a repeat pap 11/2022 that showed low grade intraepithelial lesion , colposcopy was CIN 1 and has yearly follow ups with gyn    Adult acne: seeing Dr. Phillip Heal, she has a follow up in April with her   Insomnia: she has difficulty falling and staying asleep, symptoms started over 5 years ago, she has tried multiple medications, she had sleep study in the past, tried Ambien , Trazodone, benadryl  and melatonin.  She is now off Seroquel and taking Gabapentin given by her gyn and it helps her fall asleep but causes her to feel groggy in the morning , if when she takes it early in the pm  Perennial allergic rhinitis: she is taking xyzal otc and seems to be controlling symptoms   Patient Active Problem List    Diagnosis Date Noted   LGSIL on Pap smear of cervix with positive HRHPV on 11/23/2022 01/09/2023   Perennial allergic rhinitis with seasonal variation 09/04/2022   Dyslipidemia 09/04/2022   Vitamin D deficiency 09/04/2022   Migraine without aura and with status migrainosus, not intractable 09/04/2022   Insomnia, persistent 09/04/2022   Major depression, recurrent, chronic ([place]) 09/04/2022   Skin tags, multiple acquired 07/12/2022   ASCUS with positive high risk HPV cervical on 11/22/2021 11/28/2021   Status post laparoscopic supracervical hysterectomy for endometriosis 11/22/2021   Acne vulgaris 06/13/2021   Other hypertrophic disorders of the skin 06/13/2021   Dysplasia of cervix, low grade (CIN 1) 12/15/2020   Chronic rhinitis 08/05/2020   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[place]) 02/06/2020   Cervical dysplasia 11/24/2019   Hypertension due to endocrine disorder 12/10/2018   Pre-diabetes 09/27/2018   Prolapsed internal hemorrhoids, grade 2 08/28/2018   History of endometriosis 04/10/2018   GAD (generalized anxiety disorder) 04/10/2018   Mild recurrent major depression ([place]) 04/10/2018   Obesity (BMI 30.0-34.9) 04/10/2018   ADHD (attention deficit hyperactivity disorder), 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   Palpitation 07/12/2017   Family history of malignant neoplasm of gastrointestinal tract    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   Allergic rhinitis 02/04/2016   Bilateral leg edema 04/09/2012   HTN (hypertension), benign     Past Surgical History:  Procedure Laterality Date   COLONOSCOPY WITH PROPOFOL N/A 04/09/2017   Procedure: COLONOSCOPY WITH PROPOFOL;  Surgeon: Lucilla Lame, MD;  Location: [place];  Service: Endoscopy;  Laterality: N/A;  LATEX sensitivity   IR GENERIC HISTORICAL  07/07/2016   IR VENOGRAM RENAL UNI LEFT 07/07/2016 Aletta Edouard, MD [facility]   IR GENERIC HISTORICAL  07/07/2016   IR VENOUS SAMPLING 07/07/2016 Aletta Edouard, MD [facility]   IR GENERIC HISTORICAL   07/07/2016   IR VENOGRAM ADRENAL BI 07/07/2016 Aletta Edouard, MD [facility]   IR GENERIC HISTORICAL  07/07/2016   IR US GUIDE VASC ACCESS RIGHT 07/07/2016 Aletta Edouard, MD [facility]   IR GENERIC HISTORICAL  07/07/2016   IR ANGIOGRAM SELECTIVE EACH ADDITIONAL VESSEL 07/07/2016 Aletta Edouard, MD [facility]   IR GENERIC HISTORICAL  07/07/2016   IR VENOUS SAMPLING 07/07/2016 Aletta Edouard, MD [facility]   IR GENERIC HISTORICAL  07/07/2016   IR US GUIDE VASC ACCESS RIGHT 07/07/2016 Aletta Edouard, MD [facility]   LAPAROSCOPIC SUPRACERVICAL HYSTERECTOMY  09/27/2010   For endometriosis. Ovaries and tubes are in place.   LEEP  03/07/2022   NASAL SEPTUM SURGERY     ROBOTIC ADRENALECTOMY Left 10/27/2016   Procedure: XI ROBOTIC LEFT ADRENALECTOMY;  Surgeon: Michael Boston, MD;  Location: [facility];  Service: General;  Laterality: Left;   TONSILLECTOMY     TUBAL LIGATION  2006   WISDOM TOOTH EXTRACTION      Family History  Problem Relation Age of Onset   COPD Mother    Hypotension Mother    Colon cancer Father    Emphysema Father    Cancer Father        colon   Hyperlipidemia Sister    Breast cancer Maternal Aunt        x2 times ? age of dx   Heart failure Paternal Uncle    Ovarian cancer Maternal Grandmother    Heart attack Cousin 32   Heart disease Cousin        heart attack   Adrenal disorder Neg Hx     Social History   Tobacco Use   Smoking status: Never    Passive exposure: Never   Smokeless tobacco: Never  Substance Use Topics   Alcohol use: Yes    Comment: social (1 drink/mo)     Current Outpatient Medications:    gabapentin (NEURONTIN) 600 MG tablet, Take 1 tablet (600 mg total) by mouth at bedtime., Disp: 30 tablet, Rfl: 3   metoprolol succinate (TOPROL-XL) 50 MG 24 hr tablet, Take with or immediately following a meal., Disp: 90 tablet, Rfl: 1   spironolactone-hydrochlorothiazide (ALDACTAZIDE) 25-25 MG tablet, Take 1 tablet by mouth daily., Disp: 90  tablet, Rfl: 1   Ubrogepant (UBRELVY) 100 MG TABS, Take 1 tablet by mouth every other day., Disp: 16 tablet, Rfl: 2   venlafaxine XR (EFFEXOR XR) 37.5 MG 24 hr capsule, Take 1 capsule (37.5 mg total) by mouth daily with breakfast., Disp: 30 capsule, Rfl: 3   lisdexamfetamine (VYVANSE) 40 MG capsule, Take 1 capsule (40 mg total) by mouth every morning. (Patient not taking: Reported on 03/05/2023), Disp: 90 capsule, Rfl: 0   Tirzepatide-Weight Management (ZEPBOUND) 2.5 MG/0.5ML SOAJ, Inject 2.5 mg into the skin once a week. (Patient not taking: Reported on 03/05/2023), Disp: 2 mL, Rfl: 0  Allergies  Allergen Reactions   Ace Inhibitors Cough   Latex Itching    Gloves, condoms   Pollen Extract     I personally reviewed active problem list, medication list, allergies, family history, social history, health maintenance with the patient/caregiver today.   ROS  Constitutional: Negative for fever , positive for weight change.  Respiratory: Negative for cough and shortness of breath.   Cardiovascular: Negative for chest pain or palpitations.  Gastrointestinal: Negative for abdominal pain, no bowel changes.  Musculoskeletal: Negative for gait problem or joint swelling.  Skin: Negative for rash.  Neurological: Negative for dizziness or headache.  No other specific complaints in a complete review of systems (except as listed in HPI above).   Objective  Vitals:   03/05/23 0845  BP: 116/78  Pulse: 82  Resp: 16  SpO2: 97%  Weight: 213 lb (96.6 kg)  Height: 5\' 6"  (1.676 m)    Body mass index is 34.38 kg/m.  Physical Exam  Constitutional: Patient appears well-developed and well-nourished. Obese  No distress.  HEENT: head atraumatic, normocephalic, pupils equal and reactive to light, neck supple Cardiovascular: Normal rate, regular rhythm and normal heart sounds.  No murmur heard. No BLE edema. Pulmonary/Chest: Effort normal and breath sounds normal. No respiratory distress. Abdominal:  Soft.  There is no tenderness. Psychiatric: Patient has a normal mood and affect. behavior is normal. Judgment and thought content normal.    PHQ2/9:    03/05/2023    8:44 AM 12/04/2022    8:19 AM 09/04/2022   10:41 AM 07/12/2022    9:53 AM 05/10/2022   11:25 AM  Depression screen PHQ 2/9  Decreased Interest 2 0 0 0 3  Down, Depressed, Hopeless 0 0 0 0 0  PHQ - 2 Score 2 0 0 0 3  Altered sleeping 3 0 3  3  Tired, decreased energy 3 0 3  0  Change in appetite 0 0 3  3  Feeling bad or failure about yourself  0 0 0  0  Trouble concentrating 3 0 3  0  Moving slowly or fidgety/restless 0 0 0  0  Suicidal thoughts 0 0 0  0  PHQ-9 Score 11 0 12  9    phq 9 is positive   Fall Risk:    03/05/2023    8:44 AM 12/04/2022    8:19 AM 09/04/2022   10:36 AM 07/12/2022    9:53 AM 05/10/2022   11:25 AM  Fall Risk   Falls in the past year? 0 0 0 0 0  Number falls in past yr: 0  0 0 0  Injury with Fall? 0  0 0 0  Risk for fall due to : No Fall Risks No Fall Risks No Fall Risks No Fall Risks No Fall Risks  Follow up Falls prevention discussed Falls prevention discussed;Education provided;Falls evaluation completed Falls prevention discussed Falls prevention discussed Falls prevention discussed      Functional Status Survey: Is the patient deaf or have difficulty hearing?: No Does the patient have difficulty seeing, even when wearing glasses/contacts?: Yes Does the patient have difficulty concentrating, remembering, or making decisions?: Yes Does the patient have difficulty walking or climbing stairs?: No Does the patient have difficulty dressing or bathing?: No Does the patient have difficulty doing errands alone such as visiting a doctor's office or shopping?: No    Assessment & Plan  1. Major depression, recurrent, chronic ([place])  She needs to try Effexor given by gyn  2. Hx of total adrenalectomy [facility])  Doing well  3. HTN (hypertension), benign  - metoprolol succinate  (TOPROL-XL) 50 MG 24 hr tablet; Take with or immediately following a meal.  Dispense: 90 tablet; Refill: 1 - spironolactone-hydrochlorothiazide (ALDACTAZIDE) 25-25 MG tablet; Take 1 tablet by mouth daily.  Dispense: 90 tablet; Refill: 1  4. ADHD (attention deficit hyperactivity disorder), inattentive type  - amphetamine-dextroamphetamine (ADDERALL XR) 20 MG 24 hr capsule; Take 1 capsule (20 mg total) by mouth daily.  Dispense: 30 capsule; Refill: 0  5. Vitamin D deficiency  Continue otc vitamin D supplementation  6. Insomnia, persistent  Unchanged   7. Pre-diabetes  Discussed diet   8. Migraine without aura and with status migrainosus, not intractable  No recent episodes  9. Obesity (BMI 30.0-34.9)  Discussed with the patient the risk posed by an increased BMI. Discussed importance of portion control, calorie counting and at least 150 minutes of physical activity weekly. Avoid sweet beverages and drink more water. Eat at least 6 servings of fruit and vegetables daily    10. Dyslipidemia  Stable  11. GAD (generalized anxiety disorder)  Try Effexor   12. Perennial allergic rhinitis with seasonal variation  Controlled with otc medication  13. Dysplasia of cervix, low grade (CIN 1)  Keep follow  up with gyn  14. Decreased GFR  - BASIC METABOLIC PANEL WITH GFR  15. Palpitation  - metoprolol succinate (TOPROL-XL) 50 MG 24 hr tablet; Take with or immediately following a meal.  Dispense: 90 tablet; Refill: 1  16. Vasomotor symptoms due to menopause   Needs to take Effexor

## 2023-03-06 LAB — BASIC METABOLIC PANEL WITH GFR
BUN/Creatinine Ratio: 12 (calc) (ref 6–22)
BUN: 12 mg/dL (ref 7–25)
CO2: 27 mmol/L (ref 20–32)
Calcium: 9.1 mg/dL (ref 8.6–10.2)
Chloride: 101 mmol/L (ref 98–110)
Creat: 1.04 mg/dL — ABNORMAL HIGH (ref 0.50–0.99)
Glucose, Bld: 107 mg/dL — ABNORMAL HIGH (ref 65–99)
Potassium: 4.2 mmol/L (ref 3.5–5.3)
Sodium: 137 mmol/L (ref 135–146)
eGFR: 66 mL/min/{1.73_m2} (ref >=60)

## 2023-03-20 ENCOUNTER — Ambulatory Visit
Admission: RE | Admit: 2023-03-20 | Discharge: 2023-03-20 | Disposition: A | Discharge status: Home / Self Care | Payer: BC Managed Care – PPO | Source: Ambulatory Visit | Attending: Physician Assistant | Admitting: Physician Assistant

## 2023-03-20 VITALS — BP 122/87 | HR 69 | Temp 98.6°F | Resp 16

## 2023-03-20 DIAGNOSIS — J302 Other seasonal allergic rhinitis: Secondary | ICD-10-CM | POA: Diagnosis not present

## 2023-03-20 MED ORDER — PREDNISONE 20 MG PO TABS: 40.0000 mg | ORAL_TABLET | Freq: Every day | ORAL | 0 refills | Status: AC

## 2023-03-20 NOTE — ED Triage Notes (Signed)
Pt c/o bilateral ear itching and hoarse x 2 days.

## 2023-03-20 NOTE — ED Provider Notes (Signed)
[facility]    CSN: [id number] Arrival date & time: 03/20/23  1908      History   Chief Complaint Chief Complaint  Patient presents with   Ear itching     HPI Amanda Fields is a 47 y.o. female.   Patient is a 47 year old female who presents complaint of itchy ears, sinus drainage, and other seasonal allergy symptoms.  Patient states she woke up yesterday with a hoarse voice.  Patient states she has a difficult time with allergies.  She takes Xyzal in the morning and Singulair at night as well as using Flonase and a Nettie pot with the saline packets.  Patient reports some sinus pressure and not sure if this is all seasonal allergy related or more of a sinus infection.    Past Medical History:  Diagnosis Date   ADHD (attention deficit hyperactivity disorder), 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   Adrenal mass, left    Anemia    none since Hysterectomy done-related to heavy menses   Asthma    Related to allergies"wheezes with pollen exposure" no asthma attacks   Breast lipoma 09/06/2011   resolved- no surgery-tx. medically.   Cough due to ACE inhibitor    Dizziness    Edema leg    Elevated troponin I level 12/31/2015   Endometriosis    Hypertension    a. echo 03/2012 EF 60-65%, moderate LVH, nl LV diastolic fxn, nl PASP; b. 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 03/05/2023   Decreased GFR 03/05/2023   LGSIL on Pap smear of cervix with positive HRHPV on 11/23/2022 01/09/2023   Perennial allergic rhinitis with seasonal variation 09/04/2022   Dyslipidemia 09/04/2022   Vitamin D deficiency 09/04/2022   Migraine without aura and with status migrainosus, not intractable 09/04/2022   Insomnia, persistent 09/04/2022   Major depression, recurrent, chronic  09/04/2022   Skin tags, multiple acquired 07/12/2022   ASCUS with positive high risk HPV cervical on 11/22/2021 11/28/2021   Status post laparoscopic supracervical hysterectomy for endometriosis 11/22/2021   Acne vulgaris 06/13/2021   Other hypertrophic disorders of the skin 06/13/2021   Dysplasia of cervix, low grade (CIN 1) 12/15/2020   Chronic rhinitis 08/05/2020   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/06/2020   Cervical dysplasia 11/24/2019   Hypertension due to endocrine disorder 12/10/2018   Pre-diabetes 09/27/2018   Prolapsed internal hemorrhoids, grade 2 08/28/2018   History of endometriosis 04/10/2018   GAD (generalized anxiety disorder) 04/10/2018   Mild recurrent major depression 04/10/2018   Obesity (BMI 30.0-34.9) 04/10/2018   ADHD (attention deficit hyperactivity disorder), 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   Palpitation 07/12/2017   Family history of malignant neoplasm of gastrointestinal tract    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   Allergic rhinitis 02/04/2016   Bilateral leg edema 04/09/2012   HTN (hypertension), benign     Past Surgical History:  Procedure Laterality Date   COLONOSCOPY WITH PROPOFOL N/A 04/09/2017   Procedure: COLONOSCOPY WITH PROPOFOL;  Surgeon: Lucilla Lame, MD;  Location: [place];  Service: Endoscopy;  Laterality: N/A;  LATEX sensitivity   IR GENERIC HISTORICAL  07/07/2016   IR VENOGRAM RENAL UNI LEFT 07/07/2016 Aletta Edouard, MD [facility]   IR GENERIC HISTORICAL  07/07/2016   IR VENOUS SAMPLING 07/07/2016 Eulas Post  Kathlene Cote, MD [facility]   IR GENERIC HISTORICAL  07/07/2016   IR VENOGRAM ADRENAL BI 07/07/2016 Aletta Edouard, MD [facility]   IR GENERIC HISTORICAL  07/07/2016   IR US GUIDE VASC ACCESS RIGHT 07/07/2016 Aletta Edouard, MD [facility]   IR GENERIC HISTORICAL  07/07/2016   IR [facility] SELECTIVE EACH ADDITIONAL VESSEL 07/07/2016 Aletta Edouard, MD [facility]   IR GENERIC HISTORICAL  07/07/2016   IR VENOUS SAMPLING 07/07/2016 Aletta Edouard, MD [facility]   IR GENERIC HISTORICAL  07/07/2016   IR US GUIDE VASC ACCESS RIGHT 07/07/2016 Aletta Edouard, MD [facility]   LAPAROSCOPIC SUPRACERVICAL HYSTERECTOMY  09/27/2010   For endometriosis. Ovaries and tubes are in place.   LEEP  03/07/2022   NASAL SEPTUM SURGERY     ROBOTIC ADRENALECTOMY Left 10/27/2016   Procedure: XI ROBOTIC LEFT ADRENALECTOMY;  Surgeon: Donnice Nielsen Boston, MD;  Location: [facility];  Service: General;  Laterality: Left;   TONSILLECTOMY     TUBAL LIGATION  2006   WISDOM TOOTH EXTRACTION      OB History     Gravida  2   Para  2   Term  2   Preterm      AB      Living  2      SAB      IAB      Ectopic      Multiple      Live Births  2            Home Medications    Prior to Admission medications   Medication Sig Start Date End Date Taking? Authorizing Provider  predniSONE (DELTASONE) 20 MG tablet Take 2 tablets (40 mg total) by mouth daily for 5 days. 03/20/23 03/25/23 Yes Luvenia Redden, PA-C  amphetamine-dextroamphetamine (ADDERALL XR) 20 MG 24 hr capsule Take 1 capsule (20 mg total) by mouth daily. 03/05/23   Steele Sizer, MD  metoprolol succinate (TOPROL-XL) 50 MG 24 hr tablet Take with or immediately following a meal. 03/05/23   Sowles, Drue Stager, MD  spironolactone-hydrochlorothiazide (ALDACTAZIDE) 25-25 MG tablet Take 1 tablet by mouth daily. 03/05/23   Sowles, Drue Stager, MD  Ubrogepant (UBRELVY) 100 MG TABS Take 1 tablet by mouth every other day. 02/21/22   Steele Sizer, MD  venlafaxine XR (EFFEXOR XR) 37.5 MG 24 hr capsule Take 1 capsule (37.5 mg total) by mouth daily with breakfast. 11/23/22   Anyanwu, Sallyanne Havers, MD    Family History Family History  Problem Relation Age of Onset   COPD Mother    Hypotension Mother    Colon cancer Father    Emphysema Father    Cancer Father        colon   Hyperlipidemia Sister    Breast cancer Maternal Aunt        x2 times ? age of dx   Heart failure Paternal Uncle    Ovarian  cancer Maternal Grandmother    Heart attack Cousin 32   Heart disease Cousin        heart attack   Adrenal disorder Neg Hx     Social History Social History   Tobacco Use   Smoking status: Never    Passive exposure: Never   Smokeless tobacco: Never  Vaping Use   Vaping Use: Never used  Substance Use Topics   Alcohol use: Yes    Comment: social (1 drink/mo)   Drug use: No     Allergies   Ace inhibitors, Latex, and  Pollen extract   Review of Systems Review of Systems as noted above in HPI.  Other systems reviewed and found to be negative   Physical Exam Triage Vital Signs ED Triage Vitals  Enc Vitals Group     BP 03/20/23 1922 122/87     Pulse Rate 03/20/23 1922 69     Resp 03/20/23 1922 16     Temp 03/20/23 1922 98.6 F (37 C)     Temp Source 03/20/23 1922 Oral     SpO2 03/20/23 1922 97 %     Weight --      Height --      Head Circumference --      Peak Flow --      Pain Score 03/20/23 1921 0     Pain Loc --      Pain Edu? --      Excl. in [place]? --    No data found.  Updated Vital Signs BP 122/87 (BP Location: Right Arm)   Pulse 69   Temp 98.6 F (37 C) (Oral)   Resp 16   SpO2 97%   Visual Acuity Right Eye Distance:   Left Eye Distance:   Bilateral Distance:    Right Eye Near:   Left Eye Near:    Bilateral Near:     Physical Exam Constitutional:      Appearance: Normal appearance.  HENT:     Head: Normocephalic and atraumatic.     Right Ear: No middle ear effusion. Tympanic membrane is bulging (minimal). Tympanic membrane is not injected or erythematous.     Left Ear:  No middle ear effusion. Tympanic membrane is bulging (minimal). Tympanic membrane is not injected or erythematous.     Nose: Congestion present.     Right Sinus: No maxillary sinus tenderness or frontal sinus tenderness.     Left Sinus: No maxillary sinus tenderness or frontal sinus tenderness.     Mouth/Throat:     Mouth: Mucous membranes are moist.     Tonsils: 0 on the  right. 0 on the left.     Comments: Clear postnasal drainage Cardiovascular:     Rate and Rhythm: Normal rate and regular rhythm.  Pulmonary:     Effort: Pulmonary effort is normal. No respiratory distress.     Breath sounds: Normal breath sounds. No wheezing.  Lymphadenopathy:     Cervical: No cervical adenopathy.  Neurological:     General: No focal deficit present.     Mental Status: She is alert and oriented to person, place, and time.  Psychiatric:        Mood and Affect: Mood normal.        Behavior: Behavior normal.      UC Treatments / Results  Labs (all labs ordered are listed, but only abnormal results are displayed) Labs Reviewed - No data to display  EKG   Radiology No results found.  Procedures Procedures (including critical care time)  Medications Ordered in UC Medications - No data to display  Initial Impression / Assessment and Plan / UC Course  I have reviewed the triage vital signs and the nursing notes.  Pertinent labs & imaging results that were available during my care of the patient were reviewed by me and considered in my medical decision making (see chart for details).     Patient with seasonal allergy symptoms now with voice hoarseness x 2 days.  Patient taking Xyzal in the morning, Singulair at night as well as Flonase  and a Nettie pot.  Patent membrane with some minimal bulging but no erythema or effusion.  Will give her a short course of steroids give her some relief.  May need to follow-up with primary care for allergy shots if issues continue to worsen Final Clinical Impressions(s) / UC Diagnoses   Final diagnoses:  None     Discharge Instructions      -Prednisone: 2 tablets once a day for 5 days -Continue other allergy medications and regimen -Ibuprofen or Tylenol as needed for pain -Follow-up with primary care as needed should symptoms not improve     ED Prescriptions     Medication Sig Dispense Auth. Provider    predniSONE (DELTASONE) 20 MG tablet Take 2 tablets (40 mg total) by mouth daily for 5 days. 10 tablet Luvenia Redden, PA-C      PDMP not reviewed this encounter.   Luvenia Redden, PA-C 03/20/23 2008

## 2023-03-20 NOTE — Discharge Instructions (Addendum)
-  Prednisone: 2 tablets once a day for 5 days -Continue other allergy medications and regimen -Ibuprofen or Tylenol as needed for pain -Follow-up with primary care as needed should symptoms not improve

## 2023-04-03 ENCOUNTER — Other Ambulatory Visit: Payer: Self-pay | Admitting: Family Medicine

## 2023-04-03 DIAGNOSIS — F900 Attention-deficit hyperactivity disorder, predominantly inattentive type: Secondary | ICD-10-CM

## 2023-04-03 DIAGNOSIS — L700 Acne vulgaris: Secondary | ICD-10-CM | POA: Diagnosis not present

## 2023-04-04 NOTE — Progress Notes (Signed)
Name: Amanda Fields   MRN: [id number]    DOB: Dec 02, 1976   Date:04/06/2023       Progress Note  Subjective  Chief Complaint  Follow Up  HPI  History of secondary hypertension, currently essential HTN : she had adrenalectomy in 2017  She is now on Metoprolol because of palpitation,and spironolactone-hctz and today bp is at goal  .   Pre-diabetes : last A1C was done in Dec and it was 6.2 % , reviewed a low carb diet with patient   ADHD: diagnosed by previous pcp, Dr. Sherryll Burger, she was on Vyvanse, however there is nationwide shortage of generic form and the brand type is too expensive so we switched to Adderal however not as efficatious for her and she would like to try resuming Vyvanse . She states unable to focus without medication, mind is always wondering and really would like to try something else.   Obesity: her weight was stable in the low 220's , had COVID -19 in Nov 2021 and since than she lost over 15 lbs. It stayed between 214-217 lbs for a long time, after that she started to trend down and it was is 205.8 lbs. Avoiding sweets , still drinking juices but will try to cut that off also. Her weight is up again.   Dyslipidemia: last LDL was high at 154, discussed healthy diet   The 10-year ASCVD risk score (Arnett DK, et al., 2019) is: 2.7%   Values used to calculate the score:     Age: 47 years     Sex: Female     Is Non-Hispanic African American: Yes     Diabetic: No     Tobacco smoker: No     Systolic Blood Pressure: 116 mmHg     Is BP treated: Yes     HDL Cholesterol: 46 mg/dL     Total Cholesterol: 196 mg/dL   Major Depression and Anxiety: She has a teenager son that is in HS, he did not get accepted to the schools he applied and is frustrated, started to skip classes and is stressing her out. She took Lexapro in 2023 but switched to  Effexor Fall 2023 to help with hot flashes. Phq 9 is higher we will adjust dose of Effexor to 75 mg, discussed importance of therapy  also  Dyslipidemia:   The 10-year ASCVD risk score (Arnett DK, et al., 2019) is: 2.9%   Values used to calculate the score:     Age: 47 years     Sex: Female     Is Non-Hispanic African American: Yes     Diabetic: No     Tobacco smoker: No     Systolic Blood Pressure: 118 mmHg     Is BP treated: Yes     HDL Cholesterol: 46 mg/dL     Total Cholesterol: 196 mg/dL   Migraines: It is described as sharp, nagging, pulsating and started on her nuchal area, and radiates to her entire head, sometimes has nausea and vomiting. Also has phonophobia and photophobia  She states episodes are sporadic, she takes [place] prn She also takes beta blocker   Abnormal pap smear: pap smear done 11/2021 positive high risk HPV but negative for 16/18/45 types. ASCUS ,  she had a repeat pap 11/2022 that showed low grade intraepithelial lesion , colposcopy was CIN 1 and has yearly follow ups with gyn  Unchanged   Adult acne: seeing Dr. Cheree Ditto - Dermatologist   Insomnia: she has difficulty falling  and staying asleep, symptoms started over 6 years ago, she has tried multiple medications, she had sleep study in the past, tried Ambien , Trazodone, benadryl  and melatonin.  She is now off Seroquel and taking Gabapentin given by her gyn but stopped taking it because it makes her feel groggy during the day.   Perennial allergic rhinitis: she is taking xyzal otc, symptoms are stable    Patient Active Problem List   Diagnosis Date Noted   Vasomotor symptoms due to menopause 03/05/2023   Decreased GFR 03/05/2023   LGSIL on Pap smear of cervix with positive HRHPV on 11/23/2022 01/09/2023   Perennial allergic rhinitis with seasonal variation 09/04/2022   Dyslipidemia 09/04/2022   Vitamin D deficiency 09/04/2022   Migraine without aura and with status migrainosus, not intractable 09/04/2022   Insomnia, persistent 09/04/2022   Major depression, recurrent, chronic 09/04/2022   Skin tags, multiple acquired 07/12/2022    ASCUS with positive high risk HPV cervical on 11/22/2021 11/28/2021   Status post laparoscopic supracervical hysterectomy for endometriosis 11/22/2021   Acne vulgaris 06/13/2021   Other hypertrophic disorders of the skin 06/13/2021   Dysplasia of cervix, low grade (CIN 1) 12/15/2020   Chronic rhinitis 08/05/2020   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/06/2020   Cervical dysplasia 11/24/2019   Hypertension due to endocrine disorder 12/10/2018   Pre-diabetes 09/27/2018   Prolapsed internal hemorrhoids, grade 2 08/28/2018   History of endometriosis 04/10/2018   GAD (generalized anxiety disorder) 04/10/2018   Mild recurrent major depression 04/10/2018   Obesity (BMI 30.0-34.9) 04/10/2018   ADHD (attention deficit hyperactivity disorder), 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   Palpitation 07/12/2017   Family history of malignant neoplasm of gastrointestinal tract    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   Allergic rhinitis 02/04/2016   Bilateral leg edema 04/09/2012   HTN (hypertension), benign     Past Surgical History:  Procedure Laterality Date   COLONOSCOPY WITH PROPOFOL N/A 04/09/2017   Procedure: COLONOSCOPY WITH PROPOFOL;  Surgeon: Midge Minium, MD;  Location: [facility];  Service: Endoscopy;  Laterality: N/A;  LATEX sensitivity   IR GENERIC HISTORICAL  07/07/2016   IR VENOGRAM RENAL UNI LEFT 07/07/2016 Irish Lack, MD [facility]   IR GENERIC HISTORICAL  07/07/2016   IR VENOUS SAMPLING 07/07/2016 Irish Lack, MD [facility]   IR GENERIC HISTORICAL  07/07/2016   IR VENOGRAM ADRENAL BI 07/07/2016 Irish Lack, MD [facility]   IR GENERIC HISTORICAL  07/07/2016   IR US GUIDE VASC ACCESS RIGHT 07/07/2016 Irish Lack, MD [facility]   IR GENERIC HISTORICAL  07/07/2016   IR ANGIOGRAM SELECTIVE EACH ADDITIONAL VESSEL 07/07/2016 Irish Lack, MD [facility]   IR GENERIC HISTORICAL  07/07/2016   IR VENOUS SAMPLING 07/07/2016 Irish Lack, MD [facility]   IR GENERIC HISTORICAL   07/07/2016   IR US GUIDE VASC ACCESS RIGHT 07/07/2016 Irish Lack, MD [facility]   LAPAROSCOPIC SUPRACERVICAL HYSTERECTOMY  09/27/2010   For endometriosis. Ovaries and tubes are in place.   LEEP  03/07/2022   NASAL SEPTUM SURGERY     ROBOTIC ADRENALECTOMY Left 10/27/2016   Procedure: XI ROBOTIC LEFT ADRENALECTOMY;  Surgeon: Karie Soda, MD;  Location: [facility];  Service: General;  Laterality: Left;   TONSILLECTOMY     TUBAL LIGATION  2006   WISDOM TOOTH EXTRACTION      Family History  Problem Relation Age of Onset   COPD Mother    Hypotension Mother    Colon cancer Father    Emphysema  Father    Cancer Father        colon   Hyperlipidemia Sister    Breast cancer Maternal Aunt        x2 times ? age of dx   Heart failure Paternal Uncle    Ovarian cancer Maternal Grandmother    Heart attack Cousin 32   Heart disease Cousin        heart attack   Adrenal disorder Neg Hx     Social History   Tobacco Use   Smoking status: Never    Passive exposure: Never   Smokeless tobacco: Never  Substance Use Topics   Alcohol use: Yes    Comment: social (1 drink/mo)     Current Outpatient Medications:    lisdexamfetamine (VYVANSE) 20 MG capsule, Take 1 capsule (20 mg total) by mouth daily., Disp: 90 capsule, Rfl: 0   metoprolol succinate (TOPROL-XL) 50 MG 24 hr tablet, Take with or immediately following a meal., Disp: 90 tablet, Rfl: 1   spironolactone-hydrochlorothiazide (ALDACTAZIDE) 25-25 MG tablet, Take 1 tablet by mouth daily., Disp: 90 tablet, Rfl: 1   Ubrogepant (UBRELVY) 100 MG TABS, Take 1 tablet (100 mg total) by mouth every other day., Disp: 16 tablet, Rfl: 2   venlafaxine XR (EFFEXOR XR) 75 MG 24 hr capsule, Take 1 capsule (75 mg total) by mouth daily with breakfast., Disp: 30 capsule, Rfl: 0  Allergies  Allergen Reactions   Ace Inhibitors Cough   Latex Itching    Gloves, condoms   Pollen Extract     I personally reviewed active problem list, medication list,  allergies, family history, social history, health maintenance with the patient/caregiver today.   ROS  Ten systems reviewed and is negative except as mentioned in HPI   Objective  Vitals:   04/05/23 1123  BP: 118/72  Pulse: 85  Resp: 16  SpO2: 99%  Weight: 217 lb (98.4 kg)  Height: 5\' 6"  (1.676 m)    Body mass index is 35.02 kg/m.  Physical Exam  Constitutional: Patient appears well-developed and well-nourished. Obese  No distress.  HEENT: head atraumatic, normocephalic, pupils equal and reactive to light, neck supple Cardiovascular: Normal rate, regular rhythm and normal heart sounds.  No murmur heard. No BLE edema. Pulmonary/Chest: Effort normal and breath sounds normal. No respiratory distress. Abdominal: Soft.  There is no tenderness. Psychiatric: Patient has a normal mood and affect. behavior is normal. Judgment and thought content normal.    PHQ2/9:    04/05/2023   11:29 AM 03/05/2023    8:44 AM 12/04/2022    8:19 AM 09/04/2022   10:41 AM 07/12/2022    9:53 AM  Depression screen PHQ 2/9  Decreased Interest 3 2 0 0 0  Down, Depressed, Hopeless 0 0 0 0 0  PHQ - 2 Score 3 2 0 0 0  Altered sleeping 3 3 0 3   Tired, decreased energy 3 3 0 3   Change in appetite 3 0 0 3   Feeling bad or failure about yourself  0 0 0 0   Trouble concentrating 3 3 0 3   Moving slowly or fidgety/restless 0 0 0 0   Suicidal thoughts 0 0 0 0   PHQ-9 Score 15 11 0 12     phq 9 is positive   Fall Risk:    04/05/2023   11:22 AM 03/05/2023    8:44 AM 12/04/2022    8:19 AM 09/04/2022   10:36 AM 07/12/2022    9:53  AM  Fall Risk   Falls in the past year? 0 0 0 0 0  Number falls in past yr: 0 0  0 0  Injury with Fall? 0 0  0 0  Risk for fall due to : No Fall Risks No Fall Risks No Fall Risks No Fall Risks No Fall Risks  Follow up Falls prevention discussed Falls prevention discussed Falls prevention discussed;Education provided;Falls evaluation completed Falls prevention discussed  Falls prevention discussed      Functional Status Survey: Is the patient deaf or have difficulty hearing?: No Does the patient have difficulty seeing, even when wearing glasses/contacts?: Yes Does the patient have difficulty concentrating, remembering, or making decisions?: Yes Does the patient have difficulty walking or climbing stairs?: No Does the patient have difficulty dressing or bathing?: No Does the patient have difficulty doing errands alone such as visiting a doctor's office or shopping?: No    Assessment & Plan  1. Major depression, recurrent, chronic  - venlafaxine XR (EFFEXOR XR) 75 MG 24 hr capsule; Take 1 capsule (75 mg total) by mouth daily with breakfast.  Dispense: 30 capsule; Refill: 0 - lisdexamfetamine (VYVANSE) 20 MG capsule; Take 1 capsule (20 mg total) by mouth daily.  Dispense: 90 capsule; Refill: 0  2. HTN (hypertension), benign  Continue medications  3. Migraine without aura and with status migrainosus, not intractable  - Ubrogepant (UBRELVY) 100 MG TABS; Take 1 tablet (100 mg total) by mouth every other day.  Dispense: 16 tablet; Refill: 2  4. Hx of total adrenalectomy  Bp is at goal  5. Vasomotor symptoms due to menopause  - venlafaxine XR (EFFEXOR XR) 75 MG 24 hr capsule; Take 1 capsule (75 mg total) by mouth daily with breakfast.  Dispense: 30 capsule; Refill: 0  She was on 37.5 mg ,given by gyn but also has depression and we will adjust to 75 mg dose today   6. ADHD (attention deficit hyperactivity disorder), inattentive type  She prefers Vyvanse, we switched to Adderal due to [place] shortage but she would like to try taking Vyvanse again since it helps with focus for a longer period of time - lisdexamfetamine (VYVANSE) 20 MG capsule; Take 1 capsule (20 mg total) by mouth daily.  Dispense: 90 capsule; Refill: 0  7. GAD (generalized anxiety disorder)   8. Morbid obesity  BMI over 35 with comorbidities such as HTN, pre diabetes,  dyslipidemia She states Vyvanse helps with her ADD but also curbs her appetite and would like to try resuming medication  9. Pre-diabetes  Discussed low carb diet  10. Dyslipidemia  Discussed life style modification  11. Vitamin D deficiency  Continue supplementation

## 2023-04-05 ENCOUNTER — Other Ambulatory Visit: Payer: Self-pay

## 2023-04-05 ENCOUNTER — Encounter: Payer: Self-pay | Admitting: Family Medicine

## 2023-04-05 ENCOUNTER — Ambulatory Visit (INDEPENDENT_AMBULATORY_CARE_PROVIDER_SITE_OTHER): Payer: BC Managed Care – PPO | Admitting: Family Medicine

## 2023-04-05 VITALS — BP 118/72 | HR 85 | Resp 16 | Ht 66.0 in | Wt 217.0 lb

## 2023-04-05 DIAGNOSIS — F900 Attention-deficit hyperactivity disorder, predominantly inattentive type: Secondary | ICD-10-CM

## 2023-04-05 DIAGNOSIS — I10 Essential (primary) hypertension: Secondary | ICD-10-CM

## 2023-04-05 DIAGNOSIS — E896 Postprocedural adrenocortical (-medullary) hypofunction: Secondary | ICD-10-CM | POA: Diagnosis not present

## 2023-04-05 DIAGNOSIS — R7303 Prediabetes: Secondary | ICD-10-CM | POA: Diagnosis not present

## 2023-04-05 DIAGNOSIS — G43001 Migraine without aura, not intractable, with status migrainosus: Secondary | ICD-10-CM | POA: Diagnosis not present

## 2023-04-05 DIAGNOSIS — N951 Menopausal and female climacteric states: Secondary | ICD-10-CM | POA: Diagnosis not present

## 2023-04-05 DIAGNOSIS — E785 Hyperlipidemia, unspecified: Secondary | ICD-10-CM | POA: Diagnosis not present

## 2023-04-05 DIAGNOSIS — E559 Vitamin D deficiency, unspecified: Secondary | ICD-10-CM

## 2023-04-05 DIAGNOSIS — F411 Generalized anxiety disorder: Secondary | ICD-10-CM

## 2023-04-05 DIAGNOSIS — F339 Major depressive disorder, recurrent, unspecified: Secondary | ICD-10-CM | POA: Diagnosis not present

## 2023-04-05 MED ORDER — UBRELVY 100 MG PO TABS: 1.0000 | ORAL_TABLET | ORAL | 2 refills | Status: DC

## 2023-04-05 MED ORDER — VENLAFAXINE HCL ER 75 MG PO CP24
75.0000 mg | ORAL_CAPSULE | Freq: Every day | ORAL | 0 refills | Status: DC
Start: 2023-04-05 — End: 2023-05-07

## 2023-04-05 MED ORDER — LISDEXAMFETAMINE DIMESYLATE 20 MG PO CAPS
20.0000 mg | ORAL_CAPSULE | Freq: Every day | ORAL | 0 refills | Status: DC
Start: 2023-04-05 — End: 2023-05-08
  Filled 2023-04-05: qty 30, 30d supply, fill #0
  Filled 2023-05-07 – 2023-05-08 (×2): qty 30, 30d supply, fill #1

## 2023-04-28 ENCOUNTER — Other Ambulatory Visit: Payer: Self-pay | Admitting: Family Medicine

## 2023-04-28 DIAGNOSIS — I10 Essential (primary) hypertension: Secondary | ICD-10-CM

## 2023-04-30 ENCOUNTER — Other Ambulatory Visit: Payer: Self-pay

## 2023-04-30 DIAGNOSIS — I10 Essential (primary) hypertension: Secondary | ICD-10-CM

## 2023-04-30 MED ORDER — SPIRONOLACTONE-HCTZ 25-25 MG PO TABS: 1.0000 | ORAL_TABLET | Freq: Every day | ORAL | 0 refills | Status: DC

## 2023-05-01 ENCOUNTER — Encounter (INDEPENDENT_AMBULATORY_CARE_PROVIDER_SITE_OTHER): Payer: Self-pay

## 2023-05-01 ENCOUNTER — Encounter: Payer: Self-pay | Admitting: Obstetrics & Gynecology

## 2023-05-01 DIAGNOSIS — N760 Acute vaginitis: Secondary | ICD-10-CM

## 2023-05-01 MED ORDER — BORIC ACID CRYS: 600.0000 mg | CRYSTALS | Freq: Every day | 5 refills | Status: DC

## 2023-05-01 MED ORDER — SECNIDAZOLE 2 G PO PACK
1.0000 | PACK | Freq: Once | ORAL | 1 refills | Status: AC
Start: 2023-05-01 — End: 2023-05-01

## 2023-05-02 ENCOUNTER — Other Ambulatory Visit: Payer: Self-pay | Admitting: Family Medicine

## 2023-05-02 ENCOUNTER — Encounter: Payer: Self-pay | Admitting: Family Medicine

## 2023-05-02 MED ORDER — WEGOVY 0.25 MG/0.5ML ~~LOC~~ SOAJ: 0.2500 mg | SUBCUTANEOUS | 0 refills | Status: DC

## 2023-05-04 NOTE — Progress Notes (Unsigned)
Name: Amanda Fields   MRN: [id number]    DOB: 01-02-1976   Date:05/07/2023       Progress Note  Subjective  Chief Complaint  Follow Up  HPI   Pre-diabetes : last A1C was done in Dec and it was 6.2 % , reviewed a low carb diet with patient and we will recheck labs today   ADHD: diagnosed by previous pcp, Dr. Sherryll Burger, she was on Vyvanse, however there is nationwide shortage of generic form and the brand type is too expensive so we switched to Adderal however not as efficatious for her and she is back on Vyvanse and is working much better for her   Obesity: her weight was stable in the low 220's , had COVID -19 in Nov 2021 and since than she lost over 15 lbs. It stayed between 214-217 lbs for a long time, after that she started to trend down and it was is 205.8 lbs. Weight is up since Dec. We gave her [facility] but not able to fill it yet. She has multiple risk factors such as prediabetes, and dyslipidemia   Major Depression and Anxiety: She has a teenager son that is in HS, he did not get accepted to the schools he applied and is frustrated, he is skipping classes  She took Lexapro in 2023 but switched to  Effexor Fall 2023 to help with hot flashes. Phq 9 is higher, I sent Effexor 75 mg and she just started taking higher dose a few days ago. Discussed mindfulness  She also has hot flashes and Effexor should help with symptoms   Patient Active Problem List   Diagnosis Date Noted   Vasomotor symptoms due to menopause 03/05/2023   Decreased GFR 03/05/2023   LGSIL on Pap smear of cervix with positive HRHPV on 11/23/2022 01/09/2023   Perennial allergic rhinitis with seasonal variation 09/04/2022   Dyslipidemia 09/04/2022   Vitamin D deficiency 09/04/2022   Migraine without aura and with status migrainosus, not intractable 09/04/2022   Insomnia, persistent 09/04/2022   Major depression, recurrent, chronic (HCC) 09/04/2022   Skin tags, multiple acquired 07/12/2022   ASCUS with positive high risk HPV  cervical on 11/22/2021 11/28/2021   Status post laparoscopic supracervical hysterectomy for endometriosis 11/22/2021   Acne vulgaris 06/13/2021   Other hypertrophic disorders of the skin 06/13/2021   Dysplasia of cervix, low grade (CIN 1) 12/15/2020   Chronic rhinitis 08/05/2020   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HCC) 02/06/2020   Cervical dysplasia 11/24/2019   Hypertension due to endocrine disorder 12/10/2018   Pre-diabetes 09/27/2018   Prolapsed internal hemorrhoids, grade 2 08/28/2018   History of endometriosis 04/10/2018   GAD (generalized anxiety disorder) 04/10/2018   Mild recurrent major depression (HCC) 04/10/2018   Obesity (BMI 30.0-34.9) 04/10/2018   ADHD (attention deficit hyperactivity disorder), 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   Palpitation 07/12/2017   Family history of malignant neoplasm of gastrointestinal tract    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   Allergic rhinitis 02/04/2016   Bilateral leg edema 04/09/2012   HTN (hypertension), benign     Past Surgical History:  Procedure Laterality Date   COLONOSCOPY WITH PROPOFOL N/A 04/09/2017   Procedure: COLONOSCOPY WITH PROPOFOL;  Surgeon: Midge Minium, MD;  Location: [facility];  Service: Endoscopy;  Laterality: N/A;  LATEX sensitivity   IR GENERIC HISTORICAL  07/07/2016   IR VENOGRAM RENAL UNI LEFT 07/07/2016 Irish Lack, MD [facility]   IR GENERIC HISTORICAL  07/07/2016   IR VENOUS SAMPLING 07/07/2016 Irish Lack, MD [facility]   IR GENERIC HISTORICAL  07/07/2016   IR VENOGRAM ADRENAL BI 07/07/2016 Irish Lack, MD [facility]   IR GENERIC HISTORICAL  07/07/2016   IR US GUIDE VASC ACCESS RIGHT 07/07/2016 Irish Lack, MD [facility]   IR GENERIC HISTORICAL  07/07/2016   IR ANGIOGRAM SELECTIVE EACH ADDITIONAL VESSEL 07/07/2016 Irish Lack, MD [facility]   IR GENERIC HISTORICAL  07/07/2016   IR VENOUS SAMPLING 07/07/2016 Irish Lack, MD [facility]   IR GENERIC HISTORICAL  07/07/2016   IR US GUIDE  VASC ACCESS RIGHT 07/07/2016 Irish Lack, MD [facility]   LAPAROSCOPIC SUPRACERVICAL HYSTERECTOMY  09/27/2010   For endometriosis. Ovaries and tubes are in place.   LEEP  03/07/2022   NASAL SEPTUM SURGERY     ROBOTIC ADRENALECTOMY Left 10/27/2016   Procedure: XI ROBOTIC LEFT ADRENALECTOMY;  Surgeon: Karie Soda, MD;  Location: [facility];  Service: General;  Laterality: Left;   TONSILLECTOMY     TUBAL LIGATION  2006   WISDOM TOOTH EXTRACTION      Family History  Problem Relation Age of Onset   COPD Mother    Hypotension Mother    Colon cancer Father    Emphysema Father    Cancer Father        colon   Hyperlipidemia Sister    Breast cancer Maternal Aunt        x2 times ? age of dx   Heart failure Paternal Uncle    Ovarian cancer Maternal Grandmother    Heart attack Cousin 32   Heart disease Cousin        heart attack   Adrenal disorder Neg Hx     Social History   Tobacco Use   Smoking status: Never    Passive exposure: Never   Smokeless tobacco: Never  Substance Use Topics   Alcohol use: Yes    Comment: social (1 drink/mo)     Current Outpatient Medications:    Boric Acid CRYS, Place 600 mg vaginally at bedtime. Use vaginally every night for two weeks then twice a week for six months, Disp: 500 g, Rfl: 5   doxycycline (VIBRAMYCIN) 100 MG capsule, Take 100 mg by mouth daily., Disp: , Rfl:    lisdexamfetamine (VYVANSE) 20 MG capsule, Take 1 capsule (20 mg total) by mouth daily., Disp: 90 capsule, Rfl: 0   metoprolol succinate (TOPROL-XL) 50 MG 24 hr tablet, Take with or immediately following a meal., Disp: 90 tablet, Rfl: 1   spironolactone-hydrochlorothiazide (ALDACTAZIDE) 25-25 MG tablet, Take 1 tablet by mouth daily., Disp: 90 tablet, Rfl: 0   Ubrogepant (UBRELVY) 100 MG TABS, Take 1 tablet (100 mg total) by mouth every other day., Disp: 16 tablet, Rfl: 2   Semaglutide-Weight Management (WEGOVY) 0.25 MG/0.5ML SOAJ, Inject 0.25 mg into the skin once a week. (Patient  not taking: Reported on 05/07/2023), Disp: 2 mL, Rfl: 0   venlafaxine XR (EFFEXOR XR) 75 MG 24 hr capsule, Take 1 capsule (75 mg total) by mouth daily with breakfast., Disp: 90 capsule, Rfl: 1  Allergies  Allergen Reactions   Ace Inhibitors Cough   Latex Itching    Gloves, condoms   Pollen Extract     I personally reviewed active problem list, medication list, allergies, family history, social history, health maintenance with the patient/caregiver today.   ROS  Ten systems reviewed and is negative except as mentioned in HPI   Objective  Vitals:   05/07/23 1328  BP: 128/76  Pulse: 78  Resp:  16  Temp: 98.1 F (36.7 C)  TempSrc: Oral  SpO2: 98%  Weight: 218 lb 11.2 oz (99.2 kg)  Height: 5' 6.34" (1.685 m)    Body mass index is 34.94 kg/m.  Physical Exam  Constitutional: Patient appears well-developed and well-nourished. Obese  No distress.  HEENT: head atraumatic, normocephalic, pupils equal and reactive to light, neck supple Cardiovascular: Normal rate, regular rhythm and normal heart sounds.  No murmur heard. No BLE edema. Pulmonary/Chest: Effort normal and breath sounds normal. No respiratory distress. Abdominal: Soft.  There is no tenderness. Psychiatric: Patient has a normal mood and affect. behavior is normal. Judgment and thought content normal.   Recent Results (from the past 2160 hour(s))  BASIC METABOLIC PANEL WITH GFR     Status: Abnormal   Collection Time: 03/05/23  9:14 AM  Result Value Ref Range   Glucose, Bld 107 (H) 65 - 99 mg/dL    Comment: .            Fasting reference interval . For someone without known diabetes, a glucose value between 100 and 125 mg/dL is consistent with prediabetes and should be confirmed with a follow-up test. .    BUN 12 7 - 25 mg/dL   Creat 0.98 (H) 1.19 - 0.99 mg/dL   eGFR 66 > OR = 60 JY/NWG/[id number]   BUN/Creatinine Ratio 12 6 - 22 (calc)   Sodium 137 135 - 146 mmol/L   Potassium 4.2 3.5 - 5.3 mmol/L   Chloride  101 98 - 110 mmol/L   CO2 27 20 - 32 mmol/L   Calcium 9.1 8.6 - 10.2 mg/dL    HYQ6/5:    7/84/6962    1:37 PM 04/05/2023   11:29 AM 03/05/2023    8:44 AM 12/04/2022    8:19 AM 09/04/2022   10:41 AM  Depression screen PHQ 2/9  Decreased Interest 1 3 2  0 0  Down, Depressed, Hopeless 0 0 0 0 0  PHQ - 2 Score 1 3 2  0 0  Altered sleeping 2 3 3  0 3  Tired, decreased energy 1 3 3  0 3  Change in appetite 1 3 0 0 3  Feeling bad or failure about yourself  0 0 0 0 0  Trouble concentrating 1 3 3  0 3  Moving slowly or fidgety/restless 0 0 0 0 0  Suicidal thoughts 0 0 0 0 0  PHQ-9 Score 6 15 11  0 12  Difficult doing work/chores Somewhat difficult        phq 9 is positive   Fall Risk:    05/07/2023    1:28 PM 04/05/2023   11:22 AM 03/05/2023    8:44 AM 12/04/2022    8:19 AM 09/04/2022   10:36 AM  Fall Risk   Falls in the past year? 0 0 0 0 0  Number falls in past yr:  0 0  0  Injury with Fall?  0 0  0  Risk for fall due to : No Fall Risks No Fall Risks No Fall Risks No Fall Risks No Fall Risks  Follow up Falls prevention discussed Falls prevention discussed Falls prevention discussed Falls prevention discussed;Education provided;Falls evaluation completed Falls prevention discussed     Assessment & Plan  1. Major depression, recurrent, chronic (HCC)  - venlafaxine XR (EFFEXOR XR) 75 MG 24 hr capsule; Take 1 capsule (75 mg total) by mouth daily with breakfast.  Dispense: 90 capsule; Refill: 1  2. Morbid obesity (HCC)  She has not  been able to get Wegovy yet, she states [facility] will have it tomorrow  3. Decreased GFR  - BASIC METABOLIC PANEL WITH GFR  4. Hyperglycemia  - Hemoglobin A1c  5. Vasomotor symptoms due to menopause  - venlafaxine XR (EFFEXOR XR) 75 MG 24 hr capsule; Take 1 capsule (75 mg total) by mouth daily with breakfast.  Dispense: 90 capsule; Refill: 1  6. ADHD (attention deficit hyperactivity disorder), inattentive type  Taking Vyvanse

## 2023-05-07 ENCOUNTER — Encounter: Payer: Self-pay | Admitting: Family Medicine

## 2023-05-07 ENCOUNTER — Ambulatory Visit (INDEPENDENT_AMBULATORY_CARE_PROVIDER_SITE_OTHER): Payer: BC Managed Care – PPO | Admitting: Family Medicine

## 2023-05-07 VITALS — BP 128/76 | HR 78 | Temp 98.1°F | Resp 16 | Ht 66.34 in | Wt 218.7 lb

## 2023-05-07 DIAGNOSIS — R944 Abnormal results of kidney function studies: Secondary | ICD-10-CM | POA: Diagnosis not present

## 2023-05-07 DIAGNOSIS — F900 Attention-deficit hyperactivity disorder, predominantly inattentive type: Secondary | ICD-10-CM

## 2023-05-07 DIAGNOSIS — N951 Menopausal and female climacteric states: Secondary | ICD-10-CM | POA: Diagnosis not present

## 2023-05-07 DIAGNOSIS — R739 Hyperglycemia, unspecified: Secondary | ICD-10-CM | POA: Diagnosis not present

## 2023-05-07 DIAGNOSIS — F339 Major depressive disorder, recurrent, unspecified: Secondary | ICD-10-CM | POA: Diagnosis not present

## 2023-05-07 MED ORDER — VENLAFAXINE HCL ER 75 MG PO CP24
75.0000 mg | ORAL_CAPSULE | Freq: Every day | ORAL | 1 refills | Status: DC
Start: 2023-05-07 — End: 2023-10-08

## 2023-05-08 ENCOUNTER — Other Ambulatory Visit: Payer: Self-pay

## 2023-05-08 ENCOUNTER — Other Ambulatory Visit: Payer: Self-pay | Admitting: Family Medicine

## 2023-05-08 DIAGNOSIS — F339 Major depressive disorder, recurrent, unspecified: Secondary | ICD-10-CM

## 2023-05-08 DIAGNOSIS — F900 Attention-deficit hyperactivity disorder, predominantly inattentive type: Secondary | ICD-10-CM

## 2023-05-08 LAB — BASIC METABOLIC PANEL WITH GFR
BUN/Creatinine Ratio: 15 (calc) (ref 6–22)
BUN: 16 mg/dL (ref 7–25)
CO2: 26 mmol/L (ref 20–32)
Calcium: 9.8 mg/dL (ref 8.6–10.2)
Chloride: 102 mmol/L (ref 98–110)
Creat: 1.04 mg/dL — ABNORMAL HIGH (ref 0.50–0.99)
Glucose, Bld: 90 mg/dL (ref 65–99)
Potassium: 4.8 mmol/L (ref 3.5–5.3)
Sodium: 138 mmol/L (ref 135–146)
eGFR: 66 mL/min/{1.73_m2} (ref >=60)

## 2023-05-08 LAB — HEMOGLOBIN A1C
Hgb A1c MFr Bld: 6.4 % of total Hgb — ABNORMAL HIGH (ref <5.7)
Mean Plasma Glucose: 137 mg/dL
eAG (mmol/L): 7.6 mmol/L

## 2023-05-08 MED ORDER — LISDEXAMFETAMINE DIMESYLATE 20 MG PO CAPS
20.0000 mg | ORAL_CAPSULE | Freq: Every day | ORAL | 0 refills | Status: DC
Start: 2023-05-08 — End: 2023-06-13
  Filled 2023-05-08: qty 30, 30d supply, fill #0

## 2023-05-09 ENCOUNTER — Encounter: Payer: Self-pay | Admitting: Family Medicine

## 2023-05-21 ENCOUNTER — Other Ambulatory Visit: Payer: Self-pay | Admitting: Family Medicine

## 2023-05-22 ENCOUNTER — Other Ambulatory Visit: Payer: Self-pay | Admitting: Family Medicine

## 2023-05-22 MED ORDER — WEGOVY 0.5 MG/0.5ML ~~LOC~~ SOAJ: 0.5000 mg | SUBCUTANEOUS | 0 refills | Status: DC

## 2023-05-29 ENCOUNTER — Other Ambulatory Visit: Payer: Self-pay | Admitting: Family Medicine

## 2023-06-13 ENCOUNTER — Other Ambulatory Visit: Payer: Self-pay | Admitting: Family Medicine

## 2023-06-13 ENCOUNTER — Other Ambulatory Visit: Payer: Self-pay

## 2023-06-13 DIAGNOSIS — F900 Attention-deficit hyperactivity disorder, predominantly inattentive type: Secondary | ICD-10-CM

## 2023-06-13 DIAGNOSIS — F339 Major depressive disorder, recurrent, unspecified: Secondary | ICD-10-CM

## 2023-06-13 MED ORDER — LISDEXAMFETAMINE DIMESYLATE 20 MG PO CAPS
20.0000 mg | ORAL_CAPSULE | Freq: Every day | ORAL | 0 refills | Status: DC
Start: 2023-06-13 — End: 2023-07-06
  Filled 2023-06-13: qty 30, 30d supply, fill #0

## 2023-06-26 ENCOUNTER — Other Ambulatory Visit: Payer: Self-pay | Admitting: Family Medicine

## 2023-06-26 MED ORDER — WEGOVY 1 MG/0.5ML ~~LOC~~ SOAJ: 1.0000 mg | SUBCUTANEOUS | 0 refills | Status: DC

## 2023-07-01 ENCOUNTER — Other Ambulatory Visit: Payer: Self-pay | Admitting: Family Medicine

## 2023-07-02 ENCOUNTER — Other Ambulatory Visit: Payer: Self-pay

## 2023-07-05 NOTE — Progress Notes (Signed)
Name: Amanda Fields   MRN: [id number]    DOB: [birth year]   Date:07/06/2023       Progress Note  Subjective  Chief Complaint  Follow up   HPI  History of secondary hypertension, currently essential HTN : she had adrenalectomy in 2017  She is now on Metoprolol because of palpitation,and spironolactone-hctz  for BP however wants to switch medication due to hair loss. We will try Benicar  Pre-diabetes : last A1C was done in Dec and it was 6.2 % in May it was up to 6.4 %  , she is now on Wegovy and we will recheck labs next visit. She denies polydipsia, polyuria or polyphagia  ADHD: diagnosed by previous pcp, Dr. Sherryll Burger, she was on Vyvanse, however there is nationwide shortage of generic form and the brand type is too expensive so we switched to Adderal however not as efficatious for her and she is back on Vyvanse and is happy with current dose of 20 mg dose   Obesity: her weight was stable in the low 220's , had COVID -19 in Nov 2021 and since than she lost over 15 lbs. It stayed between 214-217 lbs for a long time, after that she started to trend down and it was is 205.8 lb, but started to trend up again.  We gave her Reginal Lutes and started taking it 04/2023 , start weight of 219 lbs, she is on the 1 mg dose now and weight is down 13 lbs - she has lost over 5 % of original weight. She has been walking five days a week for one hour a total of 5 miles.  Medication is curbing her appetite and no longer snacking all day   Major Depression and Anxiety: She has a teenager son that is in HS, he did not get accepted to the schools he applied and is frustrated, he is skipping classes  She took Lexapro in 2023 but switched to  Effexor Fall 2023 to help with hot flashes. Phq 9 is higher but she is stressed about her son going to college, he did not get enough financial aid but she will find a way to support him.  She does not want to change her medications at this time.   Hair loss:   Patient Active Problem List    Diagnosis Date Noted   Vasomotor symptoms due to menopause 03/05/2023   Decreased GFR 03/05/2023   LGSIL on Pap smear of cervix with positive HRHPV on 11/23/2022 01/09/2023   Perennial allergic rhinitis with seasonal variation 09/04/2022   Dyslipidemia 09/04/2022   Vitamin D deficiency 09/04/2022   Migraine without aura and with status migrainosus, not intractable 09/04/2022   Insomnia, persistent 09/04/2022   Major depression, recurrent, chronic (HCC) 09/04/2022   Skin tags, multiple acquired 07/12/2022   ASCUS with positive high risk HPV cervical on 11/22/2021 11/28/2021   Status post laparoscopic supracervical hysterectomy for endometriosis 11/22/2021   Acne vulgaris 06/13/2021   Other hypertrophic disorders of the skin 06/13/2021   Dysplasia of cervix, low grade (CIN 1) 12/15/2020   Chronic rhinitis 08/05/2020   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HCC) 02/06/2020   Cervical dysplasia 11/24/2019   Hypertension due to endocrine disorder 12/10/2018   Pre-diabetes 09/27/2018   Prolapsed internal hemorrhoids, grade 2 08/28/2018   History of endometriosis 04/10/2018   GAD (generalized anxiety disorder) 04/10/2018   Mild recurrent major depression (HCC) 04/10/2018   Obesity (BMI 30.0-34.9) 04/10/2018   ADHD (attention deficit hyperactivity disorder), 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  Palpitation 07/12/2017   Family history of malignant neoplasm of gastrointestinal tract    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   Allergic rhinitis 02/04/2016   Bilateral leg edema 04/09/2012   HTN (hypertension), benign     Past Surgical History:  Procedure Laterality Date   COLONOSCOPY WITH PROPOFOL N/A 04/09/2017   Procedure: COLONOSCOPY WITH PROPOFOL;  Surgeon: Midge Minium, MD;  Location: [facility];  Service: Endoscopy;  Laterality: N/A;  LATEX sensitivity   IR GENERIC HISTORICAL  07/07/2016   IR VENOGRAM RENAL UNI LEFT 07/07/2016 Irish Lack, MD [facility]   IR GENERIC HISTORICAL  07/07/2016   IR  VENOUS SAMPLING 07/07/2016 Irish Lack, MD [facility]   IR GENERIC HISTORICAL  07/07/2016   IR VENOGRAM ADRENAL BI 07/07/2016 Irish Lack, MD [facility]   IR GENERIC HISTORICAL  07/07/2016   IR US GUIDE VASC ACCESS RIGHT 07/07/2016 Irish Lack, MD [facility]   IR GENERIC HISTORICAL  07/07/2016   IR ANGIOGRAM SELECTIVE EACH ADDITIONAL VESSEL 07/07/2016 Irish Lack, MD [facility]   IR GENERIC HISTORICAL  07/07/2016   IR VENOUS SAMPLING 07/07/2016 Irish Lack, MD [facility]   IR GENERIC HISTORICAL  07/07/2016   IR US GUIDE VASC ACCESS RIGHT 07/07/2016 Irish Lack, MD [facility]   LAPAROSCOPIC SUPRACERVICAL HYSTERECTOMY  09/27/2010   For endometriosis. Ovaries and tubes are in place.   LEEP  03/07/2022   NASAL SEPTUM SURGERY     ROBOTIC ADRENALECTOMY Left 10/27/2016   Procedure: XI ROBOTIC LEFT ADRENALECTOMY;  Surgeon: Karie Soda, MD;  Location: [facility];  Service: General;  Laterality: Left;   TONSILLECTOMY     TUBAL LIGATION  2006   WISDOM TOOTH EXTRACTION      Family History  Problem Relation Age of Onset   COPD Mother    Hypotension Mother    Colon cancer Father    Emphysema Father    Cancer Father        colon   Hyperlipidemia Sister    Breast cancer Maternal Aunt        x2 times ? age of dx   Heart failure Paternal Uncle    Ovarian cancer Maternal Grandmother    Heart attack Cousin 32   Heart disease Cousin        heart attack   Adrenal disorder Neg Hx     Social History   Tobacco Use   Smoking status: Never    Passive exposure: Never   Smokeless tobacco: Never  Substance Use Topics   Alcohol use: Yes    Comment: social (1 drink/mo)     Current Outpatient Medications:    Boric Acid CRYS, Place 600 mg vaginally at bedtime. Use vaginally every night for two weeks then twice a week for six months, Disp: 500 g, Rfl: 5   lisdexamfetamine (VYVANSE) 20 MG capsule, Take 1 capsule (20 mg total) by mouth daily., Disp: 30 capsule, Rfl: 0    metoprolol succinate (TOPROL-XL) 50 MG 24 hr tablet, Take with or immediately following a meal., Disp: 90 tablet, Rfl: 1   Semaglutide-Weight Management (WEGOVY) 1 MG/0.5ML SOAJ, Inject 1 mg into the skin once a week., Disp: 2 mL, Rfl: 0   spironolactone-hydrochlorothiazide (ALDACTAZIDE) 25-25 MG tablet, Take 1 tablet by mouth daily., Disp: 90 tablet, Rfl: 0   Ubrogepant (UBRELVY) 100 MG TABS, Take 1 tablet (100 mg total) by mouth every other day., Disp: 16 tablet, Rfl: 2   venlafaxine XR (EFFEXOR XR) 75 MG 24 hr capsule, Take 1 capsule (  75 mg total) by mouth daily with breakfast., Disp: 90 capsule, Rfl: 1  Allergies  Allergen Reactions   Ace Inhibitors Cough   Latex Itching    Gloves, condoms   Pollen Extract     I personally reviewed active problem list, medication list, allergies, family history, social history, health maintenance with the patient/caregiver today.   ROS  Ten systems reviewed and is negative except as mentioned in HPI    Objective  Vitals:   07/06/23 1329  BP: 132/82  Pulse: 92  Resp: 16  SpO2: 99%  Weight: 206 lb (93.4 kg)  Height: 5\' 6"  (1.676 m)    Body mass index is 33.25 kg/m.  Physical Exam  Constitutional: Patient appears well-developed and well-nourished. Obese No distress.  HEENT: head atraumatic, normocephalic, pupils equal and reactive to light, neck supple Cardiovascular: Normal rate, regular rhythm and normal heart sounds.  No murmur heard. No BLE edema. Pulmonary/Chest: Effort normal and breath sounds normal. No respiratory distress. Abdominal: Soft.  There is no tenderness. Psychiatric: Patient has a normal mood and affect. behavior is normal. Judgment and thought content normal.     PHQ2/9:    07/06/2023    1:39 PM 05/07/2023    1:37 PM 04/05/2023   11:29 AM 03/05/2023    8:44 AM 12/04/2022    8:19 AM  Depression screen PHQ 2/9  Decreased Interest 3 1 3 2  0  Down, Depressed, Hopeless 1 0 0 0 0  PHQ - 2 Score 4 1 3 2  0  Altered  sleeping 3 2 3 3  0  Tired, decreased energy 0 1 3 3  0  Change in appetite 0 1 3 0 0  Feeling bad or failure about yourself  0 0 0 0 0  Trouble concentrating 0 1 3 3  0  Moving slowly or fidgety/restless 0 0 0 0 0  Suicidal thoughts 0 0 0 0 0  PHQ-9 Score 7 6 15 11  0  Difficult doing work/chores  Somewhat difficult       phq 9 is positive   Fall Risk:    07/06/2023    1:29 PM 05/07/2023    1:28 PM 04/05/2023   11:22 AM 03/05/2023    8:44 AM 12/04/2022    8:19 AM  Fall Risk   Falls in the past year? 0 0 0 0 0  Number falls in past yr: 0  0 0   Injury with Fall? 0  0 0   Risk for fall due to : No Fall Risks No Fall Risks No Fall Risks No Fall Risks No Fall Risks  Follow up Falls prevention discussed Falls prevention discussed Falls prevention discussed Falls prevention discussed Falls prevention discussed;Education provided;Falls evaluation completed     Functional Status Survey: Is the patient deaf or have difficulty hearing?: No Does the patient have difficulty seeing, even when wearing glasses/contacts?: No Does the patient have difficulty concentrating, remembering, or making decisions?: No Does the patient have difficulty walking or climbing stairs?: No Does the patient have difficulty dressing or bathing?: No Does the patient have difficulty doing errands alone such as visiting a doctor's office or shopping?: No    Assessment & Plan  1. ADHD (attention deficit hyperactivity disorder), inattentive type  - lisdexamfetamine (VYVANSE) 20 MG capsule; Take 1 capsule (20 mg total) by mouth daily.  Dispense: 90 capsule; Refill: 0  2. Major depression, recurrent, chronic (HCC)  - lisdexamfetamine (VYVANSE) 20 MG capsule; Take 1 capsule (20 mg total) by mouth daily.  Dispense: 90 capsule; Refill: 0  3. HTN (hypertension), benign  - metoprolol succinate (TOPROL-XL) 50 MG 24 hr tablet; Take with or immediately following a meal.  Dispense: 90 tablet; Refill: 1 - olmesartan  (BENICAR) 20 MG tablet; Take 1 tablet (20 mg total) by mouth daily.  Dispense: 90 tablet; Refill: 0  4. Palpitation  - metoprolol succinate (TOPROL-XL) 50 MG 24 hr tablet; Take with or immediately following a meal.  Dispense: 90 tablet; Refill: 1  5. Migraine without aura and with status migrainosus, not intractable  - Ubrogepant (UBRELVY) 100 MG TABS; Take 1 tablet (100 mg total) by mouth every other day.  Dispense: 16 tablet; Refill: 2  6. Obesity (BMI 30.0-34.9)  - Semaglutide-Weight Management (WEGOVY) 1 MG/0.5ML SOAJ; Inject 1 mg into the skin once a week.  Dispense: 2 mL; Refill: 0  7. GAD (generalized anxiety disorder)  Stable  8. Pre-diabetes  Exercising and eating less  9. 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HCC)   stable

## 2023-07-06 ENCOUNTER — Encounter: Payer: Self-pay | Admitting: Family Medicine

## 2023-07-06 ENCOUNTER — Ambulatory Visit (INDEPENDENT_AMBULATORY_CARE_PROVIDER_SITE_OTHER): Payer: BC Managed Care – PPO | Admitting: Family Medicine

## 2023-07-06 VITALS — BP 132/82 | HR 92 | Resp 16 | Ht 66.0 in | Wt 206.0 lb

## 2023-07-06 DIAGNOSIS — G43001 Migraine without aura, not intractable, with status migrainosus: Secondary | ICD-10-CM

## 2023-07-06 DIAGNOSIS — R7303 Prediabetes: Secondary | ICD-10-CM

## 2023-07-06 DIAGNOSIS — F411 Generalized anxiety disorder: Secondary | ICD-10-CM

## 2023-07-06 DIAGNOSIS — F339 Major depressive disorder, recurrent, unspecified: Secondary | ICD-10-CM

## 2023-07-06 DIAGNOSIS — E669 Obesity, unspecified: Secondary | ICD-10-CM

## 2023-07-06 DIAGNOSIS — R002 Palpitations: Secondary | ICD-10-CM

## 2023-07-06 DIAGNOSIS — E896 Postprocedural adrenocortical (-medullary) hypofunction: Secondary | ICD-10-CM

## 2023-07-06 DIAGNOSIS — F900 Attention-deficit hyperactivity disorder, predominantly inattentive type: Secondary | ICD-10-CM | POA: Diagnosis not present

## 2023-07-06 DIAGNOSIS — I10 Essential (primary) hypertension: Secondary | ICD-10-CM | POA: Diagnosis not present

## 2023-07-06 MED ORDER — OLMESARTAN MEDOXOMIL 20 MG PO TABS
20.0000 mg | ORAL_TABLET | Freq: Every day | ORAL | 0 refills | Status: DC
Start: 2023-07-06 — End: 2023-10-08

## 2023-07-06 MED ORDER — LISDEXAMFETAMINE DIMESYLATE 20 MG PO CAPS: 20.0000 mg | ORAL_CAPSULE | Freq: Every day | ORAL | 0 refills | Status: DC

## 2023-07-06 MED ORDER — METOPROLOL SUCCINATE ER 50 MG PO TB24: ORAL_TABLET | ORAL | 1 refills | Status: DC

## 2023-07-06 MED ORDER — UBRELVY 100 MG PO TABS: 1.0000 | ORAL_TABLET | ORAL | 2 refills | Status: DC

## 2023-07-06 MED ORDER — WEGOVY 1 MG/0.5ML ~~LOC~~ SOAJ: 1.0000 mg | SUBCUTANEOUS | 0 refills | Status: DC

## 2023-07-11 DIAGNOSIS — L299 Pruritus, unspecified: Secondary | ICD-10-CM | POA: Diagnosis not present

## 2023-07-11 DIAGNOSIS — G4733 Obstructive sleep apnea (adult) (pediatric): Secondary | ICD-10-CM | POA: Diagnosis not present

## 2023-07-11 DIAGNOSIS — R0683 Snoring: Secondary | ICD-10-CM | POA: Diagnosis not present

## 2023-07-11 DIAGNOSIS — J342 Deviated nasal septum: Secondary | ICD-10-CM | POA: Diagnosis not present

## 2023-07-25 ENCOUNTER — Other Ambulatory Visit: Payer: Self-pay | Admitting: Family Medicine

## 2023-07-25 DIAGNOSIS — F900 Attention-deficit hyperactivity disorder, predominantly inattentive type: Secondary | ICD-10-CM

## 2023-07-25 DIAGNOSIS — F339 Major depressive disorder, recurrent, unspecified: Secondary | ICD-10-CM

## 2023-07-25 DIAGNOSIS — E669 Obesity, unspecified: Secondary | ICD-10-CM

## 2023-07-25 MED ORDER — WEGOVY 1 MG/0.5ML ~~LOC~~ SOAJ
1.0000 mg | SUBCUTANEOUS | 0 refills | Status: DC
Start: 2023-07-25 — End: 2023-08-30
  Filled 2023-08-08: qty 2, 28d supply, fill #0

## 2023-07-25 NOTE — Telephone Encounter (Signed)
Unable to refuse 

## 2023-07-29 IMAGING — US US ABDOMEN LIMITED
1 series · 9 of 9 positions shown · non-contrast
Comparison: None.

CLINICAL DATA: 45-year-old female with umbilical hernia.

EXAM:
ULTRASOUND ABDOMEN LIMITED

[Series 1: us abdomen limited · 9 acquisitions, 9 frames shown]
[im 1/9]
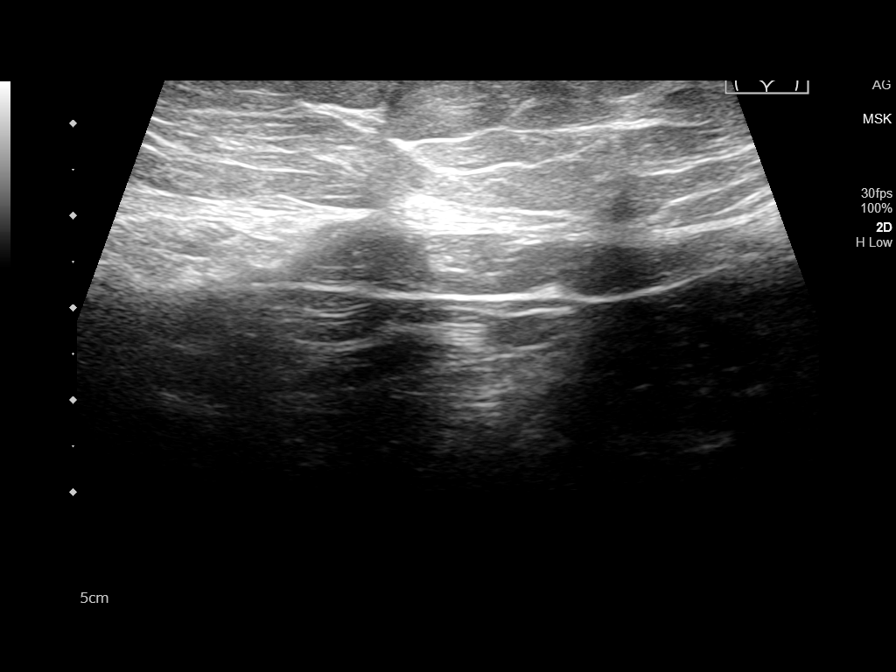
[im 2/9]
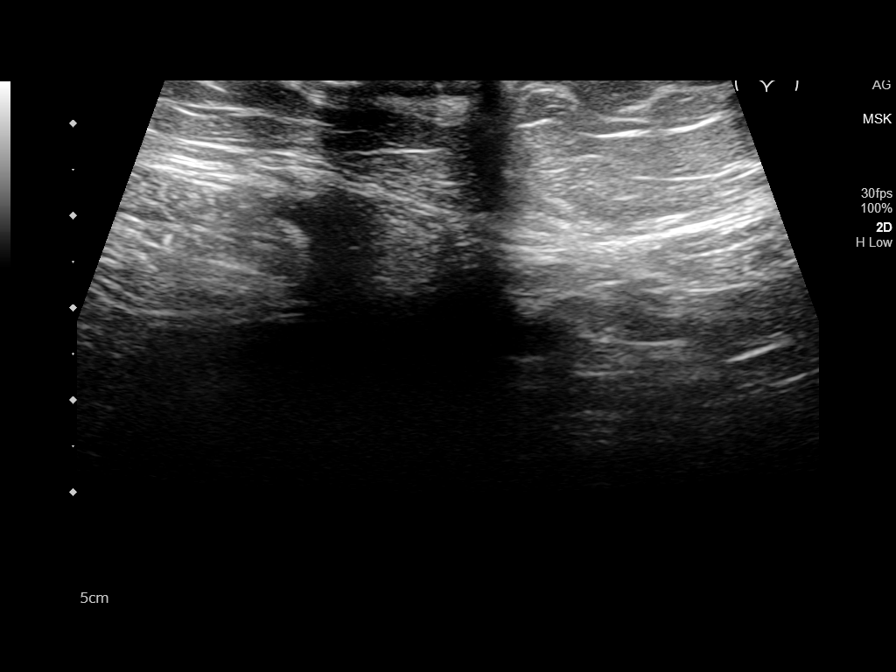
[im 3/9]
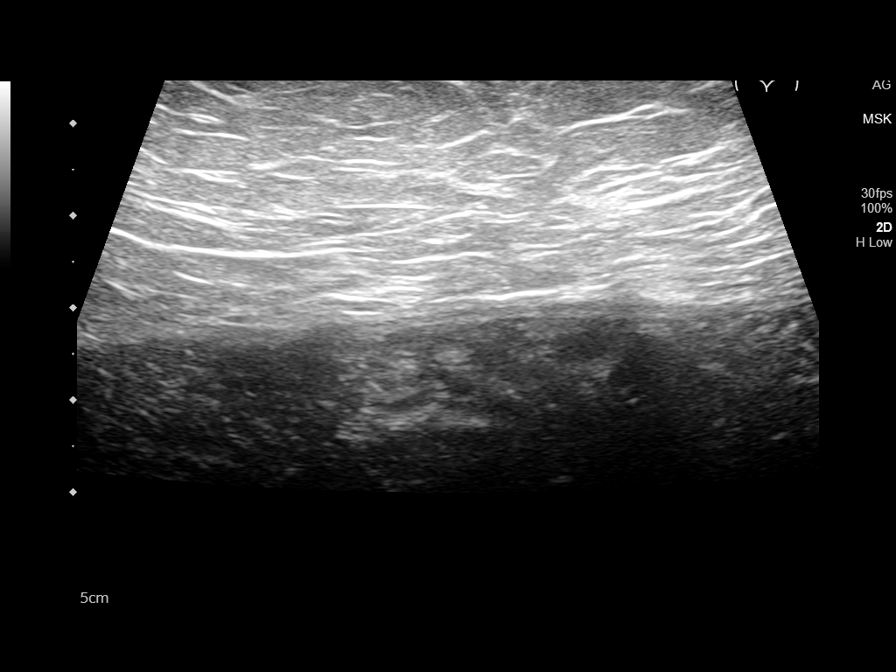
[im 4/9]
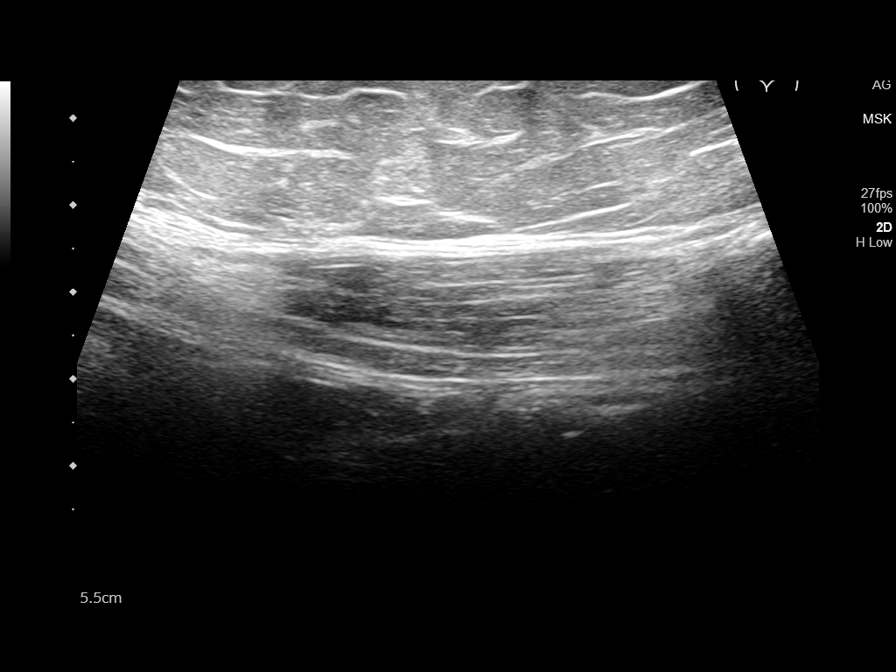
[im 5/9]
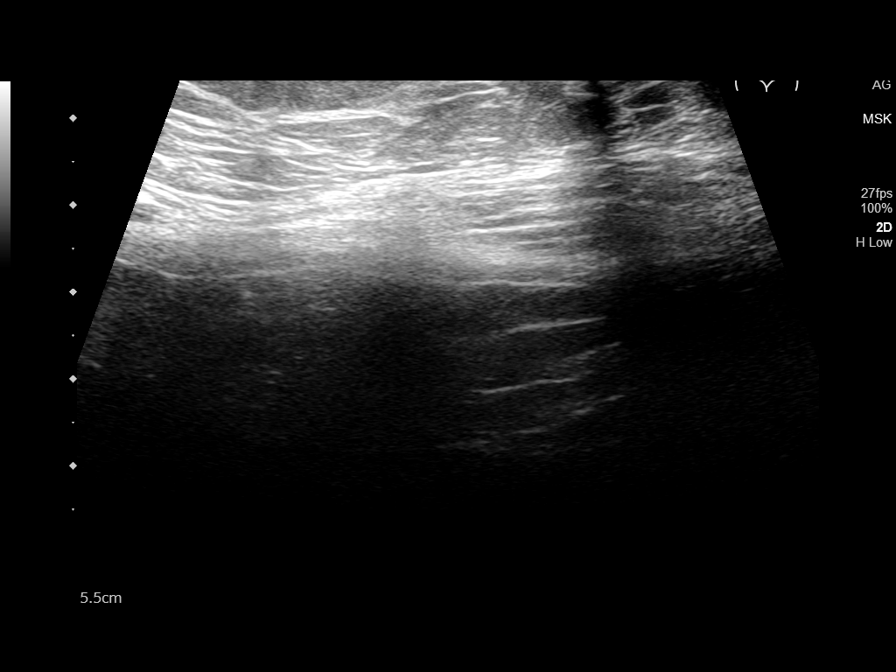
[im 6/9]
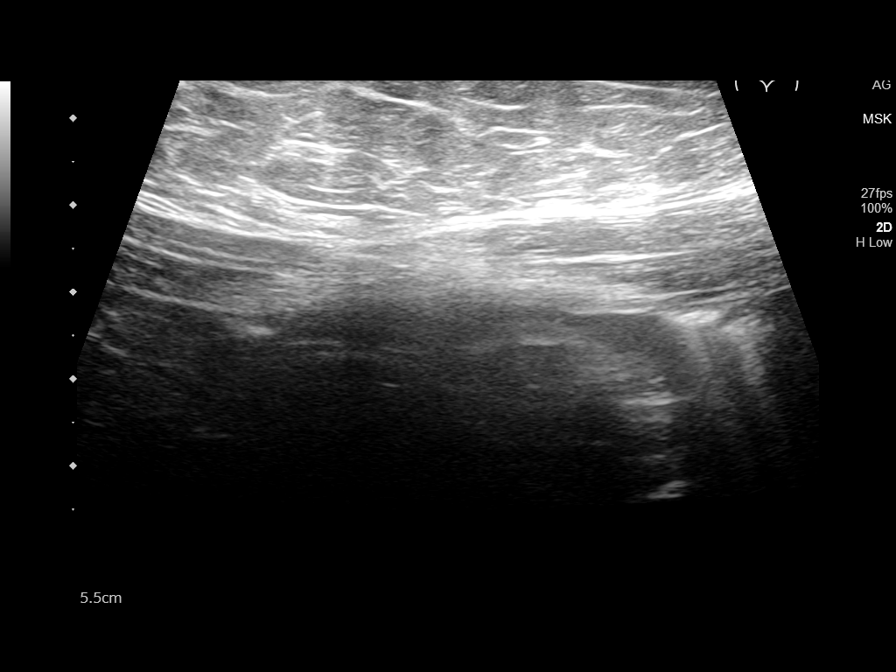
[im 7/9]
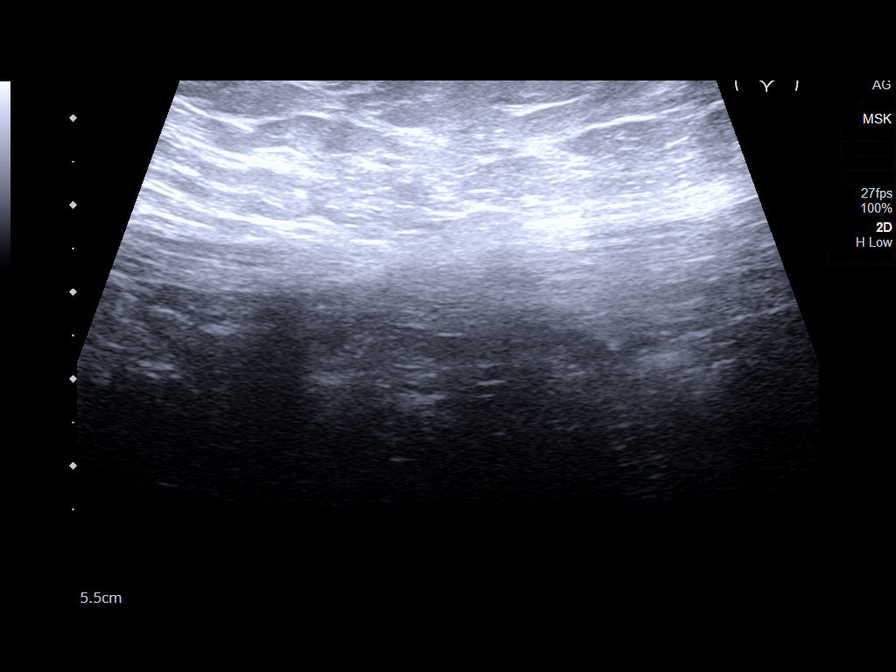
[im 8/9]
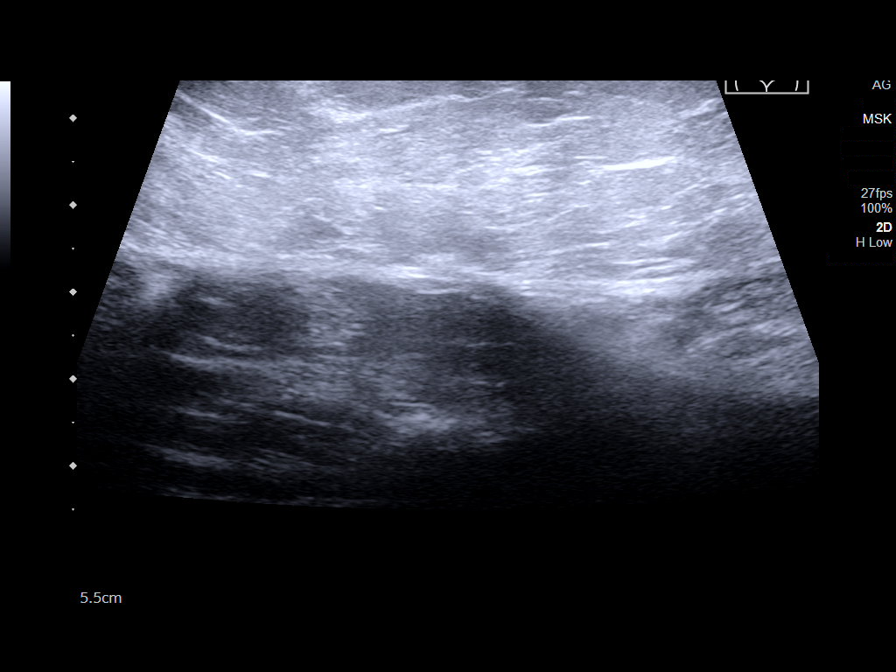
[im 9/9]
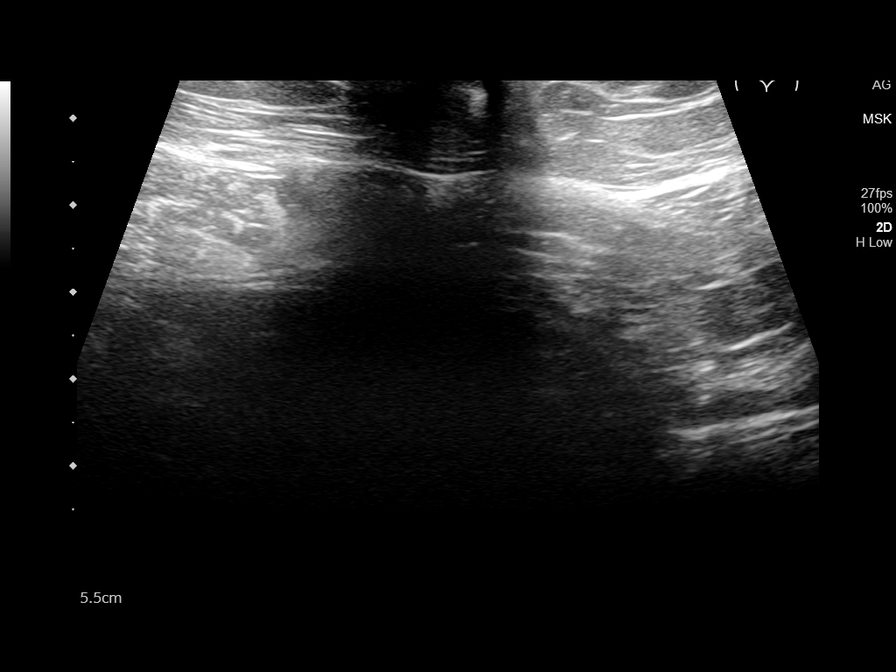

[9 of 9 positions shown; findings below may reference images not displayed]

FINDINGS: Targeted sonographic images of the anterior abdominal wall in the
region of the umbilicus was performed.

No mass, hernia, fluid collection, or architectural distortion.
IMPRESSION: Negative.

## 2023-07-31 ENCOUNTER — Encounter: Payer: Self-pay | Admitting: Family Medicine

## 2023-07-31 ENCOUNTER — Other Ambulatory Visit: Payer: Self-pay

## 2023-07-31 ENCOUNTER — Other Ambulatory Visit: Payer: Self-pay | Admitting: Family Medicine

## 2023-07-31 DIAGNOSIS — F900 Attention-deficit hyperactivity disorder, predominantly inattentive type: Secondary | ICD-10-CM

## 2023-07-31 DIAGNOSIS — F339 Major depressive disorder, recurrent, unspecified: Secondary | ICD-10-CM

## 2023-07-31 MED ORDER — LISDEXAMFETAMINE DIMESYLATE 20 MG PO CAPS
20.0000 mg | ORAL_CAPSULE | Freq: Every day | ORAL | 0 refills | Status: DC
Start: 2023-07-31 — End: 2023-08-30
  Filled 2023-07-31: qty 30, 30d supply, fill #0

## 2023-08-08 ENCOUNTER — Other Ambulatory Visit: Payer: Self-pay

## 2023-08-30 ENCOUNTER — Other Ambulatory Visit: Payer: Self-pay | Admitting: Family Medicine

## 2023-08-30 ENCOUNTER — Other Ambulatory Visit: Payer: Self-pay

## 2023-08-30 DIAGNOSIS — F900 Attention-deficit hyperactivity disorder, predominantly inattentive type: Secondary | ICD-10-CM

## 2023-08-30 DIAGNOSIS — F339 Major depressive disorder, recurrent, unspecified: Secondary | ICD-10-CM

## 2023-08-30 DIAGNOSIS — E669 Obesity, unspecified: Secondary | ICD-10-CM

## 2023-08-30 MED ORDER — LISDEXAMFETAMINE DIMESYLATE 20 MG PO CAPS
20.0000 mg | ORAL_CAPSULE | Freq: Every day | ORAL | 0 refills | Status: DC
Start: 2023-08-30 — End: 2023-10-08
  Filled 2023-08-30: qty 30, 30d supply, fill #0

## 2023-08-30 MED ORDER — WEGOVY 1.7 MG/0.75ML ~~LOC~~ SOAJ
1.7000 mg | SUBCUTANEOUS | 0 refills | Status: DC
  Filled 2023-08-30: qty 3, 28d supply, fill #0

## 2023-08-31 ENCOUNTER — Other Ambulatory Visit: Payer: Self-pay

## 2023-09-06 ENCOUNTER — Encounter: Payer: Self-pay | Admitting: Obstetrics & Gynecology

## 2023-09-06 DIAGNOSIS — F5231 Female orgasmic disorder: Secondary | ICD-10-CM

## 2023-09-06 DIAGNOSIS — N951 Menopausal and female climacteric states: Secondary | ICD-10-CM

## 2023-09-11 MED ORDER — ESTRADIOL 0.05 MG/24HR TD PTWK
0.0500 mg | MEDICATED_PATCH | TRANSDERMAL | 12 refills | Status: AC
Start: 2023-09-11 — End: ?

## 2023-09-11 MED ORDER — ESTRADIOL-LEVONORGESTREL 0.045-0.015 MG/DAY TD PTWK: 1.0000 | MEDICATED_PATCH | TRANSDERMAL | 1 refills | Status: DC

## 2023-09-26 DIAGNOSIS — H35033 Hypertensive retinopathy, bilateral: Secondary | ICD-10-CM | POA: Diagnosis not present

## 2023-09-27 ENCOUNTER — Other Ambulatory Visit: Payer: Self-pay

## 2023-09-27 ENCOUNTER — Other Ambulatory Visit: Payer: Self-pay | Admitting: Family Medicine

## 2023-09-27 DIAGNOSIS — E66811 Obesity, class 1: Secondary | ICD-10-CM

## 2023-09-27 MED ORDER — WEGOVY 1.7 MG/0.75ML ~~LOC~~ SOAJ
1.7000 mg | SUBCUTANEOUS | 0 refills | Status: DC
Start: 2023-09-27 — End: 2023-10-08
  Filled 2023-09-27 – 2023-10-02 (×2): qty 3, 28d supply, fill #0

## 2023-10-02 ENCOUNTER — Encounter (INDEPENDENT_AMBULATORY_CARE_PROVIDER_SITE_OTHER): Payer: Self-pay

## 2023-10-02 ENCOUNTER — Other Ambulatory Visit: Payer: Self-pay

## 2023-10-03 ENCOUNTER — Encounter: Payer: Self-pay | Admitting: Obstetrics & Gynecology

## 2023-10-05 NOTE — Progress Notes (Unsigned)
Name: Amanda Fields   MRN: [id number]    DOB: 10-Dec-1976   Date:10/08/2023       Progress Note  Subjective  Chief Complaint  Follow Up  HPI  History of secondary hypertension, currently essential HTN : she had adrenalectomy in 2017  She is now on Metoprolol because of palpitation,and spironolactone-hctz  for BP however asked to change medication due to hair loss. She is now on Benicar but bp is not at goal, we will try benicar hydrochlorothiazide 20/12.5 mg   Pre-diabetes : last A1C was done in Dec and it was 6.2 % in May it was up to 6.4 %, she is now on Wegovy and losing weight, we will recheck labs with her CPE  Migraine headaches: she had an episode last night, starts on face as a pressure. Taking Ubrelvy prn   ADHD: diagnosed by previous pcp, Dr. Sherryll Burger, she was on Vyvanse, however there is nationwide shortage of generic form and the brand type is too expensive so we switched to Adderal however not as efficatious for her and she is back on Vyvanse and 20 mg dose works well for her   Obesity: her weight was stable in the low 220's , had COVID -19 in Nov 2021 and since than she lost over 15 lbs. It stayed between 214-217 lbs for a long time, after that she started to trend down and it was is 205.8 lb, but started to trend up again.  We gave her Reginal Lutes and started taking it 04/2023 , start weight of 219 lbs, she is now on 1.7 mg dose and weight is down to 191 lbs . She has been walking 3  days a week  but walking longer when she goes for a walk. Last week she walked 14 miles in one day Medication is curbing her appetite   Major Depression and Anxiety: She took Lexapro in 2023 but switched to  Effexor Fall 2023 to help with hot flashes. Phq 9 is higher both of her children are living in [place], daughter has a child, son is doing a Architectural technologist, only stress now is work. She is looking for a new job but is agreeing on going up on Effexor to 150 mg at this time   Patient Active Problem List    Diagnosis Date Noted   Vasomotor symptoms due to menopause 03/05/2023   Decreased GFR 03/05/2023   LGSIL on Pap smear of cervix with positive HRHPV on 11/23/2022 01/09/2023   Perennial allergic rhinitis with seasonal variation 09/04/2022   Dyslipidemia 09/04/2022   Vitamin D deficiency 09/04/2022   Migraine without aura and with status migrainosus, not intractable 09/04/2022   Insomnia, persistent 09/04/2022   Major depression, recurrent, chronic (HCC) 09/04/2022   Skin tags, multiple acquired 07/12/2022   ASCUS with positive high risk HPV cervical on 11/22/2021 11/28/2021   Status post laparoscopic supracervical hysterectomy for endometriosis 11/22/2021   Acne vulgaris 06/13/2021   Other hypertrophic disorders of the skin 06/13/2021   Dysplasia of cervix, low grade (CIN 1) 12/15/2020   Chronic rhinitis 08/05/2020   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HCC) 02/06/2020   Cervical dysplasia 11/24/2019   Hypertension due to endocrine disorder 12/10/2018   Pre-diabetes 09/27/2018   Prolapsed internal hemorrhoids, grade 2 08/28/2018   History of endometriosis 04/10/2018   GAD (generalized anxiety disorder) 04/10/2018   Mild recurrent major depression (HCC) 04/10/2018   Obesity (BMI 30.0-34.9) 04/10/2018   ADHD (attention deficit hyperactivity disorder), 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   Palpitation  07/12/2017   Family history of malignant neoplasm of gastrointestinal tract    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   Allergic rhinitis 02/04/2016   Bilateral leg edema 04/09/2012   HTN (hypertension), benign     Past Surgical History:  Procedure Laterality Date   COLONOSCOPY WITH PROPOFOL N/A 04/09/2017   Procedure: COLONOSCOPY WITH PROPOFOL;  Surgeon: Midge Minium, MD;  Location: [facility];  Service: Endoscopy;  Laterality: N/A;  LATEX sensitivity   IR GENERIC HISTORICAL  07/07/2016   IR VENOGRAM RENAL UNI LEFT 07/07/2016 Irish Lack, MD [facility]   IR GENERIC HISTORICAL  07/07/2016   IR  VENOUS SAMPLING 07/07/2016 Irish Lack, MD [facility]   IR GENERIC HISTORICAL  07/07/2016   IR VENOGRAM ADRENAL BI 07/07/2016 Irish Lack, MD [facility]   IR GENERIC HISTORICAL  07/07/2016   IR US GUIDE VASC ACCESS RIGHT 07/07/2016 Irish Lack, MD [facility]   IR GENERIC HISTORICAL  07/07/2016   IR ANGIOGRAM SELECTIVE EACH ADDITIONAL VESSEL 07/07/2016 Irish Lack, MD [facility]   IR GENERIC HISTORICAL  07/07/2016   IR VENOUS SAMPLING 07/07/2016 Irish Lack, MD [facility]   IR GENERIC HISTORICAL  07/07/2016   IR US GUIDE VASC ACCESS RIGHT 07/07/2016 Irish Lack, MD [facility]   LAPAROSCOPIC SUPRACERVICAL HYSTERECTOMY  09/27/2010   For endometriosis. Ovaries and tubes are in place.   LEEP  03/07/2022   NASAL SEPTUM SURGERY     ROBOTIC ADRENALECTOMY Left 10/27/2016   Procedure: XI ROBOTIC LEFT ADRENALECTOMY;  Surgeon: Karie Soda, MD;  Location: [facility];  Service: General;  Laterality: Left;   TONSILLECTOMY     TUBAL LIGATION  2006   WISDOM TOOTH EXTRACTION      Family History  Problem Relation Age of Onset   COPD Mother    Hypotension Mother    Colon cancer Father    Emphysema Father    Cancer Father        colon   Hyperlipidemia Sister    Breast cancer Maternal Aunt        x2 times ? age of dx   Heart failure Paternal Uncle    Ovarian cancer Maternal Grandmother    Heart attack Cousin 32   Heart disease Cousin        heart attack   Adrenal disorder Neg Hx     Social History   Tobacco Use   Smoking status: Never    Passive exposure: Never   Smokeless tobacco: Never  Substance Use Topics   Alcohol use: Yes    Comment: social (1 drink/mo)     Current Outpatient Medications:    Boric Acid CRYS, Place 600 mg vaginally at bedtime. Use vaginally every night for two weeks then twice a week for six months, Disp: 500 g, Rfl: 5   estradiol (CLIMARA) 0.05 mg/24hr patch, Place 1 patch (0.05 mg total) onto the skin once a week., Disp: 4 patch,  Rfl: 12   lisdexamfetamine (VYVANSE) 20 MG capsule, Take 1 capsule (20 mg total) by mouth daily., Disp: 30 capsule, Rfl: 0   metoprolol succinate (TOPROL-XL) 50 MG 24 hr tablet, Take with or immediately following a meal., Disp: 90 tablet, Rfl: 1   olmesartan (BENICAR) 20 MG tablet, Take 1 tablet (20 mg total) by mouth daily., Disp: 90 tablet, Rfl: 0   Semaglutide-Weight Management (WEGOVY) 1.7 MG/0.75ML SOAJ, Inject 1.7 mg into the skin once a week., Disp: 3 mL, Rfl: 0   spironolactone-hydrochlorothiazide (ALDACTAZIDE) 25-25 MG tablet, Take 1 tablet by  mouth daily., Disp: 90 tablet, Rfl: 0   Ubrogepant (UBRELVY) 100 MG TABS, Take 1 tablet (100 mg total) by mouth every other day., Disp: 16 tablet, Rfl: 2   venlafaxine XR (EFFEXOR XR) 75 MG 24 hr capsule, Take 1 capsule (75 mg total) by mouth daily with breakfast., Disp: 90 capsule, Rfl: 1  Allergies  Allergen Reactions   Ace Inhibitors Cough   Latex Itching    Gloves, condoms   Pollen Extract     I personally reviewed active problem list, medication list, allergies, family history, social history, health maintenance with the patient/caregiver today.   ROS  Ten systems reviewed and is negative except as mentioned in HPI    Objective  Vitals:   10/08/23 1355  BP: 132/86  Pulse: 87  Resp: 16  Weight: 191 lb (86.6 kg)  Height: 5\' 6"  (1.676 m)    Body mass index is 30.83 kg/m.  Physical Exam  Constitutional: Patient appears well-developed and well-nourished. Obese  No distress.  HEENT: head atraumatic, normocephalic, pupils equal and reactive to light, neck supple Cardiovascular: Normal rate, regular rhythm and normal heart sounds.  No murmur heard. No BLE edema. Pulmonary/Chest: Effort normal and breath sounds normal. No respiratory distress. Abdominal: Soft.  There is no tenderness. Psychiatric: Patient has a normal mood and affect. behavior is normal. Judgment and thought content normal.    PHQ2/9:    10/08/2023     1:54 PM 07/06/2023    1:39 PM 05/07/2023    1:37 PM 04/05/2023   11:29 AM 03/05/2023    8:44 AM  Depression screen PHQ 2/9  Decreased Interest 1 3 1 3 2   Down, Depressed, Hopeless 1 1 0 0 0  PHQ - 2 Score 2 4 1 3 2   Altered sleeping 0 3 2 3 3   Tired, decreased energy 3 0 1 3 3   Change in appetite 3 0 1 3 0  Feeling bad or failure about yourself  0 0 0 0 0  Trouble concentrating 3 0 1 3 3   Moving slowly or fidgety/restless 0 0 0 0 0  Suicidal thoughts 0 0 0 0 0  PHQ-9 Score 11 7 6 15 11   Difficult doing work/chores   Somewhat difficult      phq 9 is positive   Fall Risk:    10/08/2023    1:54 PM 07/06/2023    1:29 PM 05/07/2023    1:28 PM 04/05/2023   11:22 AM 03/05/2023    8:44 AM  Fall Risk   Falls in the past year? 0 0 0 0 0  Number falls in past yr: 0 0  0 0  Injury with Fall? 0 0  0 0  Risk for fall due to : No Fall Risks No Fall Risks No Fall Risks No Fall Risks No Fall Risks  Follow up Falls prevention discussed Falls prevention discussed Falls prevention discussed Falls prevention discussed Falls prevention discussed      Functional Status Survey: Is the patient deaf or have difficulty hearing?: No Does the patient have difficulty seeing, even when wearing glasses/contacts?: No Does the patient have difficulty concentrating, remembering, or making decisions?: Yes Does the patient have difficulty walking or climbing stairs?: No Does the patient have difficulty dressing or bathing?: No Does the patient have difficulty doing errands alone such as visiting a doctor's office or shopping?: No    Assessment & Plan  1. HTN (hypertension), benign  - olmesartan-hydrochlorothiazide (BENICAR HCT) 20-12.5 MG tablet; Take 1  tablet by mouth daily.  Dispense: 90 tablet; Refill: 0  2. ADHD (attention deficit hyperactivity disorder), inattentive type  - lisdexamfetamine (VYVANSE) 20 MG capsule; Take 1 capsule (20 mg total) by mouth daily.  Dispense: 30 capsule; Refill: 0 -  lisdexamfetamine (VYVANSE) 20 MG capsule; Take 1 capsule (20 mg total) by mouth daily.  Dispense: 30 capsule; Refill: 0 - lisdexamfetamine (VYVANSE) 20 MG capsule; Take 1 capsule (20 mg total) by mouth daily.  Dispense: 30 capsule; Refill: 0  3. Major depression, recurrent, chronic (HCC)  - lisdexamfetamine (VYVANSE) 20 MG capsule; Take 1 capsule (20 mg total) by mouth daily.  Dispense: 30 capsule; Refill: 0 - lisdexamfetamine (VYVANSE) 20 MG capsule; Take 1 capsule (20 mg total) by mouth daily.  Dispense: 30 capsule; Refill: 0 - lisdexamfetamine (VYVANSE) 20 MG capsule; Take 1 capsule (20 mg total) by mouth daily.  Dispense: 30 capsule; Refill: 0 - venlafaxine XR (EFFEXOR XR) 150 MG 24 hr capsule; Take 1 capsule (150 mg total) by mouth daily with breakfast.  Dispense: 90 capsule; Refill: 0  4. Vasomotor symptoms due to menopause  - venlafaxine XR (EFFEXOR XR) 150 MG 24 hr capsule; Take 1 capsule (150 mg total) by mouth daily with breakfast.  Dispense: 90 capsule; Refill: 0  5. Obesity (BMI 30.0-34.9)  - Semaglutide-Weight Management (WEGOVY) 1.7 MG/0.75ML SOAJ; Inject 1.7 mg into the skin once a week.  Dispense: 9 mL; Refill: 0

## 2023-10-08 ENCOUNTER — Other Ambulatory Visit: Payer: Self-pay

## 2023-10-08 ENCOUNTER — Ambulatory Visit: Payer: BC Managed Care – PPO | Admitting: Family Medicine

## 2023-10-08 ENCOUNTER — Encounter: Payer: Self-pay | Admitting: Family Medicine

## 2023-10-08 DIAGNOSIS — F900 Attention-deficit hyperactivity disorder, predominantly inattentive type: Secondary | ICD-10-CM

## 2023-10-08 DIAGNOSIS — E66811 Obesity, class 1: Secondary | ICD-10-CM

## 2023-10-08 DIAGNOSIS — N951 Menopausal and female climacteric states: Secondary | ICD-10-CM | POA: Diagnosis not present

## 2023-10-08 DIAGNOSIS — I10 Essential (primary) hypertension: Secondary | ICD-10-CM

## 2023-10-08 DIAGNOSIS — F339 Major depressive disorder, recurrent, unspecified: Secondary | ICD-10-CM

## 2023-10-08 MED ORDER — LISDEXAMFETAMINE DIMESYLATE 20 MG PO CAPS
20.0000 mg | ORAL_CAPSULE | Freq: Every day | ORAL | 0 refills | Status: DC
  Filled 2023-10-08: qty 30, 30d supply, fill #0

## 2023-10-08 MED ORDER — WEGOVY 1.7 MG/0.75ML ~~LOC~~ SOAJ
1.7000 mg | SUBCUTANEOUS | 0 refills | Status: DC
Start: 2023-10-08 — End: 2023-10-25

## 2023-10-08 MED ORDER — LISDEXAMFETAMINE DIMESYLATE 20 MG PO CAPS
20.0000 mg | ORAL_CAPSULE | Freq: Every day | ORAL | 0 refills | Status: DC
Start: 2023-10-08 — End: 2024-01-08
  Filled 2023-10-08: qty 30, 30d supply, fill #0

## 2023-10-08 MED ORDER — VENLAFAXINE HCL ER 150 MG PO CP24: 150.0000 mg | ORAL_CAPSULE | Freq: Every day | ORAL | 0 refills | Status: DC

## 2023-10-08 MED ORDER — OLMESARTAN MEDOXOMIL-HCTZ 20-12.5 MG PO TABS
1.0000 | ORAL_TABLET | Freq: Every day | ORAL | 0 refills | Status: DC
Start: 2023-10-08 — End: 2024-01-08

## 2023-10-15 ENCOUNTER — Other Ambulatory Visit: Payer: Self-pay | Admitting: Family Medicine

## 2023-10-15 DIAGNOSIS — I10 Essential (primary) hypertension: Secondary | ICD-10-CM

## 2023-10-18 ENCOUNTER — Ambulatory Visit: Payer: BC Managed Care – PPO

## 2023-10-23 ENCOUNTER — Ambulatory Visit: Payer: BC Managed Care – PPO | Admitting: Obstetrics & Gynecology

## 2023-10-25 ENCOUNTER — Other Ambulatory Visit: Payer: Self-pay | Admitting: Family Medicine

## 2023-10-25 DIAGNOSIS — E66811 Obesity, class 1: Secondary | ICD-10-CM

## 2023-10-26 MED ORDER — WEGOVY 1.7 MG/0.75ML ~~LOC~~ SOAJ: 1.7000 mg | SUBCUTANEOUS | 0 refills | Status: DC

## 2023-10-27 ENCOUNTER — Other Ambulatory Visit: Payer: Self-pay | Admitting: Family Medicine

## 2023-10-27 DIAGNOSIS — I10 Essential (primary) hypertension: Secondary | ICD-10-CM

## 2023-11-23 ENCOUNTER — Other Ambulatory Visit: Payer: Self-pay | Admitting: Obstetrics & Gynecology

## 2023-11-23 DIAGNOSIS — Z1231 Encounter for screening mammogram for malignant neoplasm of breast: Secondary | ICD-10-CM

## 2023-11-26 ENCOUNTER — Ambulatory Visit (INDEPENDENT_AMBULATORY_CARE_PROVIDER_SITE_OTHER): Payer: BC Managed Care – PPO | Admitting: Obstetrics & Gynecology

## 2023-11-26 ENCOUNTER — Other Ambulatory Visit (HOSPITAL_COMMUNITY)
Admission: RE | Admit: 2023-11-26 | Discharge: 2023-11-26 | Disposition: A | Discharge status: Home / Self Care | Payer: BC Managed Care – PPO | Source: Ambulatory Visit | Attending: Obstetrics & Gynecology | Admitting: Obstetrics & Gynecology

## 2023-11-26 ENCOUNTER — Encounter: Payer: Self-pay | Admitting: Obstetrics & Gynecology

## 2023-11-26 VITALS — BP 174/118 | HR 76 | Ht 66.0 in | Wt 190.0 lb

## 2023-11-26 DIAGNOSIS — Z01419 Encounter for gynecological examination (general) (routine) without abnormal findings: Secondary | ICD-10-CM

## 2023-11-26 DIAGNOSIS — Z113 Encounter for screening for infections with a predominantly sexual mode of transmission: Secondary | ICD-10-CM | POA: Diagnosis not present

## 2023-11-26 DIAGNOSIS — Z1339 Encounter for screening examination for other mental health and behavioral disorders: Secondary | ICD-10-CM

## 2023-11-26 DIAGNOSIS — Z90711 Acquired absence of uterus with remaining cervical stump: Secondary | ICD-10-CM

## 2023-11-26 DIAGNOSIS — R87612 Low grade squamous intraepithelial lesion on cytologic smear of cervix (LGSIL): Secondary | ICD-10-CM | POA: Insufficient documentation

## 2023-11-26 NOTE — Progress Notes (Signed)
GYNECOLOGY ANNUAL PREVENTATIVE CARE ENCOUNTER NOTE  History:     Amanda Fields is a 47 y.o. G29P2002 female here for a routine annual gynecologic exam.  Current complaints: one episode of bleeding after intercourse in October, none since.   Denies abnormal vaginal bleeding, discharge, pelvic pain, problems with intercourse or other gynecologic concerns.    Gynecologic History No LMP recorded. Patient has had a hysterectomy. Last Pap: 11/23/2022. Result was abnormal with LGSIL +HRHPV followed by colposcopy on 01/09/23 that showed CIN I.  11/22/2021 pap  ASCUS with positive HRHPV, colposcopy showed possible high grade cells and LEEP on 03/07/22 showed CIN with negative margins. Last Mammogram: 12/04/2022.  Result was normal, scheduled for next one on 12/06/2022. Last Colonoscopy: 12/25/2021.  Result was negative  Obstetric History OB History  Gravida Para Term Preterm AB Living  2 2 2     2   SAB IAB Ectopic Multiple Live Births          2    # Outcome Date GA Lbr Len/2nd Weight Sex Type Anes PTL Lv  2 Term 12/18/04    M Vag-Spont   LIV  1 Term 10/30/95    F Vag-Spont   LIV    Past Medical History:  Diagnosis Date   ADHD (attention deficit hyperactivity disorder), 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   Adrenal mass, left (HCC)    Anemia    none since Hysterectomy done-related to heavy menses   Asthma    Related to allergies"wheezes with pollen exposure" no asthma attacks   Breast lipoma 09/06/2011   resolved- no surgery-tx. medically.   Cough due to ACE inhibitor    Dizziness    Edema leg    Elevated troponin I level 12/31/2015   Endometriosis    Hypertension    a. echo 03/2012 EF 60-65%, moderate LVH, nl LV diastolic fxn, nl PASP; b. echo 12/2015: EF 55-60%   Hypertensive urgency 12/31/2015   Migraine headache    migrianes-less frequent now   Obesity    Palpitation    Pre-diabetes 09/27/2018    Past Surgical History:  Procedure Laterality Date   COLONOSCOPY WITH PROPOFOL N/A  04/09/2017   Procedure: COLONOSCOPY WITH PROPOFOL;  Surgeon: Midge Minium, MD;  Location: [facility];  Service: Endoscopy;  Laterality: N/A;  LATEX sensitivity   IR GENERIC HISTORICAL  07/07/2016   IR VENOGRAM RENAL UNI LEFT 07/07/2016 Irish Lack, MD [facility]   IR GENERIC HISTORICAL  07/07/2016   IR VENOUS SAMPLING 07/07/2016 Irish Lack, MD [facility]   IR GENERIC HISTORICAL  07/07/2016   IR VENOGRAM ADRENAL BI 07/07/2016 Irish Lack, MD [facility]   IR GENERIC HISTORICAL  07/07/2016   IR US GUIDE VASC ACCESS RIGHT 07/07/2016 Irish Lack, MD [facility]   IR GENERIC HISTORICAL  07/07/2016   IR ANGIOGRAM SELECTIVE EACH ADDITIONAL VESSEL 07/07/2016 Irish Lack, MD [facility]   IR GENERIC HISTORICAL  07/07/2016   IR VENOUS SAMPLING 07/07/2016 Irish Lack, MD [facility]   IR GENERIC HISTORICAL  07/07/2016   IR US GUIDE VASC ACCESS RIGHT 07/07/2016 Irish Lack, MD [facility]   LAPAROSCOPIC SUPRACERVICAL HYSTERECTOMY  09/27/2010   For endometriosis. Ovaries and tubes are in place.   LEEP  03/07/2022   NASAL SEPTUM SURGERY     ROBOTIC ADRENALECTOMY Left 10/27/2016   Procedure: XI ROBOTIC LEFT ADRENALECTOMY;  Surgeon: Karie Soda, MD;  Location: [facility];  Service: General;  Laterality: Left;   TONSILLECTOMY     TUBAL  LIGATION  2006   WISDOM TOOTH EXTRACTION      Current Outpatient Medications on File Prior to Visit  Medication Sig Dispense Refill   Boric Acid CRYS Place 600 mg vaginally at bedtime. Use vaginally every night for two weeks then twice a week for six months 500 g 5   estradiol (CLIMARA) 0.05 mg/24hr patch Place 1 patch (0.05 mg total) onto the skin once a week. 4 patch 12   lisdexamfetamine (VYVANSE) 20 MG capsule Take 1 capsule (20 mg total) by mouth daily. 30 capsule 0   lisdexamfetamine (VYVANSE) 20 MG capsule Take 1 capsule (20 mg total) by mouth daily. 30 capsule 0   lisdexamfetamine (VYVANSE) 20 MG capsule Take 1 capsule (20 mg  total) by mouth daily. 30 capsule 0   metoprolol succinate (TOPROL-XL) 50 MG 24 hr tablet Take with or immediately following a meal. 90 tablet 1   olmesartan-hydrochlorothiazide (BENICAR HCT) 20-12.5 MG tablet Take 1 tablet by mouth daily. 90 tablet 0   Semaglutide-Weight Management (WEGOVY) 1.7 MG/0.75ML SOAJ Inject 1.7 mg into the skin once a week. 9 mL 0   Ubrogepant (UBRELVY) 100 MG TABS Take 1 tablet (100 mg total) by mouth every other day. 16 tablet 2   venlafaxine XR (EFFEXOR XR) 150 MG 24 hr capsule Take 1 capsule (150 mg total) by mouth daily with breakfast. 90 capsule 0   No current facility-administered medications on file prior to visit.    Allergies  Allergen Reactions   Ace Inhibitors Cough   Latex Itching    Gloves, condoms   Pollen Extract     Social History:  reports that she has never smoked. She has never been exposed to tobacco smoke. She has never used smokeless tobacco. She reports current alcohol use. She reports that she does not use drugs.  Family History  Problem Relation Age of Onset   COPD Mother    Hypotension Mother    Colon cancer Father    Emphysema Father    Cancer Father        colon   Hyperlipidemia Sister    Breast cancer Maternal Aunt        x2 times ? age of dx   Heart failure Paternal Uncle    Ovarian cancer Maternal Grandmother    Heart attack Cousin 32   Heart disease Cousin        heart attack   Adrenal disorder Neg Hx     The following portions of the patient's history were reviewed and updated as appropriate: allergies, current medications, past family history, past medical history, past social history, past surgical history and problem list.  Review of Systems Pertinent items noted in HPI and remainder of comprehensive ROS otherwise negative.  Physical Exam:  BP (!) 174/118   Pulse 76   Ht 5\' 6"  (1.676 m)   Wt 190 lb (86.2 kg)   BMI 30.67 kg/m  CONSTITUTIONAL: Well-developed, well-nourished female in no acute distress.   HENT:  Normocephalic, atraumatic, External right and left ear normal.  EYES: Conjunctivae and EOM are normal. Pupils are equal, round, and reactive to light. No scleral icterus.  NECK: Normal range of motion, supple, no masses.  Normal thyroid.  SKIN: Skin is warm and dry. No rash noted. Not diaphoretic. No erythema. No pallor. MUSCULOSKELETAL: Normal range of motion. No tenderness.  No cyanosis, clubbing, or edema. NEUROLOGIC: Alert and oriented to person, place, and time. Normal reflexes, muscle tone coordination.  PSYCHIATRIC: Normal mood and affect.  Normal behavior. Normal judgment and thought content. CARDIOVASCULAR: Normal heart rate noted, regular rhythm RESPIRATORY: Clear to auscultation bilaterally. Effort and breath sounds normal, no problems with respiration noted. BREASTS: Symmetric in size. No masses, tenderness, skin changes, nipple drainage, or lymphadenopathy bilaterally. Performed in the presence of a chaperone. ABDOMEN: Soft, no distention noted.  No tenderness, rebound or guarding.  PELVIC: : Normal appearing external genitalia and urethral meatus; normal appearing vaginal mucosa and cervix. No abnormal vaginal discharge noted. Pap smear obtained. No other palpable masses, no adnexal tenderness. Performed in the presence of a chaperone    Assessment and Plan:     1. Routine screening for STI (sexually transmitted infection) Patient desires annual STI screen. - Cytology - PAP ancillary testing. - RPR+HBsAg+HCVAb+HIV  2. LGSIL on Pap smear of cervix 3. Status post laparoscopic supracervical hysterectomy for endometriosis 4. Well woman exam with routine gynecological exam - Cytology - PAP Will follow up results of pap smear and manage accordingly. Mammogram and colon cancer screening are up to date. Told to return if bleeding occurs again during intercourse, likely had a small superficial laceration, adequate lubrication recommended. Routine preventative health  maintenance measures emphasized. Please refer to After Visit Summary for other counseling recommendations.      Jaynie Collins, MD, FACOG Obstetrician & Gynecologist, [facility] for [employer], [facility]

## 2023-11-27 ENCOUNTER — Ambulatory Visit
Admission: RE | Admit: 2023-11-27 | Discharge: 2023-11-27 | Disposition: A | Discharge status: Home / Self Care | Payer: Self-pay | Source: Ambulatory Visit | Attending: Obstetrics & Gynecology | Admitting: Obstetrics & Gynecology

## 2023-11-27 DIAGNOSIS — Z1231 Encounter for screening mammogram for malignant neoplasm of breast: Secondary | ICD-10-CM

## 2023-11-27 LAB — RPR+HBSAG+HCVAB+...
HIV Screen 4th Generation wRfx: NONREACTIVE
Hep C Virus Ab: NONREACTIVE
Hepatitis B Surface Ag: NEGATIVE
RPR Ser Ql: NONREACTIVE

## 2023-11-28 ENCOUNTER — Encounter: Payer: Self-pay | Admitting: Obstetrics & Gynecology

## 2023-11-29 LAB — CYTOLOGY - PAP
Chlamydia: NEGATIVE
Comment: NEGATIVE
Comment: NEGATIVE
Comment: NEGATIVE
Comment: NEGATIVE
Comment: NEGATIVE
Comment: NORMAL
HPV 16: NEGATIVE
HPV 18 / 45: NEGATIVE
High risk HPV: POSITIVE — AB
Neisseria Gonorrhea: NEGATIVE
Trichomonas: NEGATIVE

## 2023-12-03 ENCOUNTER — Telehealth: Payer: Self-pay | Admitting: *Deleted

## 2023-12-03 NOTE — Telephone Encounter (Signed)
Pt informed of pap results and recommendation for a colpo-appt made for 01/03/24

## 2023-12-03 NOTE — Telephone Encounter (Signed)
-----   Message from Jaynie Collins sent at 11/30/2023  6:38 AM EST ----- Patient needs colposcopy for this abnormal pap, please call patient with results and schedule appointment for her.

## 2023-12-14 ENCOUNTER — Ambulatory Visit: Payer: Self-pay

## 2024-01-03 ENCOUNTER — Encounter: Payer: BC Managed Care – PPO | Admitting: Obstetrics & Gynecology

## 2024-01-08 ENCOUNTER — Encounter: Payer: Self-pay | Admitting: Family Medicine

## 2024-01-08 ENCOUNTER — Other Ambulatory Visit: Payer: Self-pay

## 2024-01-08 ENCOUNTER — Telehealth (INDEPENDENT_AMBULATORY_CARE_PROVIDER_SITE_OTHER): Payer: Self-pay | Admitting: Family Medicine

## 2024-01-08 VITALS — Wt 188.0 lb

## 2024-01-08 DIAGNOSIS — N951 Menopausal and female climacteric states: Secondary | ICD-10-CM

## 2024-01-08 DIAGNOSIS — R002 Palpitations: Secondary | ICD-10-CM

## 2024-01-08 DIAGNOSIS — I10 Essential (primary) hypertension: Secondary | ICD-10-CM

## 2024-01-08 DIAGNOSIS — R7303 Prediabetes: Secondary | ICD-10-CM

## 2024-01-08 DIAGNOSIS — E559 Vitamin D deficiency, unspecified: Secondary | ICD-10-CM

## 2024-01-08 DIAGNOSIS — G43009 Migraine without aura, not intractable, without status migrainosus: Secondary | ICD-10-CM

## 2024-01-08 DIAGNOSIS — F339 Major depressive disorder, recurrent, unspecified: Secondary | ICD-10-CM

## 2024-01-08 DIAGNOSIS — F900 Attention-deficit hyperactivity disorder, predominantly inattentive type: Secondary | ICD-10-CM

## 2024-01-08 DIAGNOSIS — E896 Postprocedural adrenocortical (-medullary) hypofunction: Secondary | ICD-10-CM

## 2024-01-08 MED ORDER — LISDEXAMFETAMINE DIMESYLATE 20 MG PO CAPS
20.0000 mg | ORAL_CAPSULE | Freq: Every day | ORAL | 0 refills | Status: DC
  Filled 2024-01-08: qty 30, 30d supply, fill #0
  Filled 2024-04-09: qty 10, 10d supply, fill #0
  Filled 2024-04-09: qty 20, 20d supply, fill #0

## 2024-01-08 MED ORDER — VENLAFAXINE HCL ER 150 MG PO CP24: 150.0000 mg | ORAL_CAPSULE | Freq: Every day | ORAL | 1 refills | Status: DC

## 2024-01-08 MED ORDER — TOPIRAMATE 100 MG PO TABS: 100.0000 mg | ORAL_TABLET | Freq: Every evening | ORAL | 1 refills | Status: DC

## 2024-01-08 MED ORDER — METOPROLOL SUCCINATE ER 50 MG PO TB24: ORAL_TABLET | ORAL | 1 refills | Status: DC

## 2024-01-08 MED ORDER — UBRELVY 100 MG PO TABS: 1.0000 | ORAL_TABLET | ORAL | 2 refills | Status: DC

## 2024-01-08 MED ORDER — WEGOVY 2.4 MG/0.75ML ~~LOC~~ SOAJ
2.4000 mg | SUBCUTANEOUS | 0 refills | Status: DC
  Filled 2024-01-08: qty 9, fill #0
  Filled 2024-02-28 (×2): qty 9, 84d supply, fill #0
  Filled 2024-03-10: qty 3, 28d supply, fill #0
  Filled 2024-03-17: qty 9, 84d supply, fill #0

## 2024-01-08 MED ORDER — OLMESARTAN MEDOXOMIL-HCTZ 20-12.5 MG PO TABS: 1.0000 | ORAL_TABLET | Freq: Every day | ORAL | 1 refills | Status: DC

## 2024-01-08 MED ORDER — LISDEXAMFETAMINE DIMESYLATE 20 MG PO CAPS
20.0000 mg | ORAL_CAPSULE | Freq: Every day | ORAL | 0 refills | Status: DC
  Filled 2024-01-08 – 2024-02-28 (×2): qty 30, 30d supply, fill #0

## 2024-01-08 MED ORDER — LISDEXAMFETAMINE DIMESYLATE 20 MG PO CAPS
20.0000 mg | ORAL_CAPSULE | Freq: Every day | ORAL | 0 refills | Status: DC
  Filled 2024-01-08: qty 30, 30d supply, fill #0

## 2024-01-08 NOTE — Progress Notes (Signed)
Name: Amanda Fields   MRN: [id number]    DOB: [birth year]   Date:01/08/2024       Progress Note  Subjective  Chief Complaint  Chief Complaint  Patient presents with   Medical Management of Chronic Issues    I connected with  Darci Current  on 01/08/24 at  9:00 AM EST by a video enabled telemedicine application and verified that I am speaking with the correct person using two identifiers.  I discussed the limitations of evaluation and management by telemedicine and the availability of in person appointments. The patient expressed understanding and agreed to proceed with the virtual visit  Staff also discussed with the patient that there may be a patient responsible charge related to this service. Patient Location: at home  Provider Location: [facility] Additional Individuals present: alone  HPI   History of secondary hypertension, currently essential HTN : she had adrenalectomy in 2017  She is now on Metoprolol because of palpitation,and spironolactone-hctz  for BP however asked to change medication due to hair loss. She is taking Benicar hydrochlorothiazide and is doing well    Pre-diabetes : last A1C was done in Dec and it was 6.2 % in May it was up to 6.4 %, she is now on Wegovy and losing weight, we will recheck labs with her CPE . She denies polyphagia, polydipsia or polyuria    Migraine headaches: she had an episode last night, starts on face as a pressure. Taking Ubrelvy prn . Topamax and metoprolol for prevention    ADHD: diagnosed by previous pcp, Dr. Sherryll Burger, she was on Vyvanse, however there is nationwide shortage of generic form and the brand type is too expensive so we switched to Adderal however not as efficatious for her and she is back on Vyvanse and 20 mg dose works well for her , she needs a refill    Obesity: her weight was stable in the low 220's , had COVID -19 in Nov 2021 and since than she lost over 15 lbs. It stayed between 214-217 lbs for a long time, after that she  started to trend down and it was is 205.8 lb, but started to trend up again.  We gave her Reginal Lutes and started taking it 04/2023 , start weight of 219 lbs, she is now on 1.7 mg dose and weight is down from 191 in October to 188 lbs this am, she is willing to go up on the dose . She is exercising every other day on her elliptical and other days HIIT   Major Depression and Anxiety: She took Lexapro in 2023 but switched to  Effexor Fall 2023 to help with hot flashes. 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 on 11/23/2022 01/09/2023   Perennial allergic rhinitis with seasonal variation 09/04/2022   Dyslipidemia 09/04/2022   Vitamin D deficiency 09/04/2022   Migraine without aura and with status migrainosus, not intractable 09/04/2022   Insomnia, persistent 09/04/2022   Major depression, recurrent, chronic (HCC) 09/04/2022   Skin tags, multiple acquired 07/12/2022   ASCUS with positive high risk HPV cervical on 11/22/2021 11/28/2021   Status post laparoscopic supracervical hysterectomy for endometriosis 11/22/2021   Acne vulgaris 06/13/2021   Other hypertrophic disorders of the  skin 06/13/2021   Dysplasia of cervix, low grade (CIN 1) 12/15/2020   Chronic rhinitis 08/05/2020   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HCC) 02/06/2020   Cervical dysplasia 11/24/2019   Hypertension due to endocrine disorder 12/10/2018   Pre-diabetes 09/27/2018   Prolapsed internal hemorrhoids, grade 2 08/28/2018   History of endometriosis 04/10/2018   GAD (generalized anxiety disorder) 04/10/2018   Mild recurrent major depression (HCC) 04/10/2018   Obesity (BMI 30.0-34.9) 04/10/2018   ADHD (attention deficit hyperactivity disorder), 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   Palpitation 07/12/2017   Family history of  malignant neoplasm of gastrointestinal tract    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   Allergic rhinitis 02/04/2016   Bilateral leg edema 04/09/2012   HTN (hypertension), benign     Past Surgical History:  Procedure Laterality Date   COLONOSCOPY WITH PROPOFOL N/A 04/09/2017   Procedure: COLONOSCOPY WITH PROPOFOL;  Surgeon: Midge Minium, MD;  Location: [facility];  Service: Endoscopy;  Laterality: N/A;  LATEX sensitivity   IR GENERIC HISTORICAL  07/07/2016   IR VENOGRAM RENAL UNI LEFT 07/07/2016 Irish Lack, MD [facility]   IR GENERIC HISTORICAL  07/07/2016   IR VENOUS SAMPLING 07/07/2016 Irish Lack, MD [facility]   IR GENERIC HISTORICAL  07/07/2016   IR VENOGRAM ADRENAL BI 07/07/2016 Irish Lack, MD [facility]   IR GENERIC HISTORICAL  07/07/2016   IR US GUIDE VASC ACCESS RIGHT 07/07/2016 Irish Lack, MD [facility]   IR GENERIC HISTORICAL  07/07/2016   IR ANGIOGRAM SELECTIVE EACH ADDITIONAL VESSEL 07/07/2016 Irish Lack, MD [facility]   IR GENERIC HISTORICAL  07/07/2016   IR VENOUS SAMPLING 07/07/2016 Irish Lack, MD [facility]   IR GENERIC HISTORICAL  07/07/2016   IR US GUIDE VASC ACCESS RIGHT 07/07/2016 Irish Lack, MD [facility]   LAPAROSCOPIC SUPRACERVICAL HYSTERECTOMY  09/27/2010   For endometriosis. Ovaries and tubes are in place.   LEEP  03/07/2022   NASAL SEPTUM SURGERY     ROBOTIC ADRENALECTOMY Left 10/27/2016   Procedure: XI ROBOTIC LEFT ADRENALECTOMY;  Surgeon: Karie Soda, MD;  Location: [facility];  Service: General;  Laterality: Left;   TONSILLECTOMY     TUBAL LIGATION  2006   WISDOM TOOTH EXTRACTION      Family History  Problem Relation Age of Onset   COPD Mother    Hypotension Mother    Colon cancer Father    Emphysema Father    Cancer Father        colon   Hyperlipidemia Sister    Breast cancer Maternal Aunt        x2 times ? age of dx   Heart failure Paternal Uncle    Ovarian cancer Maternal Grandmother    Heart attack  Cousin 32   Heart disease Cousin        heart attack   Adrenal disorder Neg Hx     Social History   Socioeconomic History   Marital status: Single    Spouse name: Not on file   Number of children: 2   Years of education: Not on file   Highest education level: Bachelor's degree (e.g., BA, AB, BS)  Occupational History   Occupation: microbiology     Employer: [employer]    Comment: [employer]    Occupation: aplication review    Employer: [employer]    Comment: graduate school   Tobacco Use   Smoking status: Never    Passive exposure: Never   Smokeless tobacco: Never  Vaping Use   Vaping status: Never  Used  Substance and Sexual Activity   Alcohol use: Yes    Comment: social (1 drink/mo)   Drug use: No   Sexual activity: Yes    Partners: Male    Birth control/protection: Surgical, Condom  Other Topics Concern   Not on file  Social History Narrative   Her son is a Holiday representative and daughter is 85 yo and just moved back home 2023 with her new born son.      Social Drivers of Health   Financial Resource Strain: Medium Risk (10/02/2023)   Overall Financial Resource Strain (CARDIA)    Difficulty of Paying Living Expenses: Somewhat hard  Food Insecurity: Food Insecurity Present (10/02/2023)   Hunger Vital Sign    Worried About Running Out of Food in the Last Year: Sometimes true    Ran Out of Food in the Last Year: Sometimes true  Transportation Needs: Unmet Transportation Needs (10/02/2023)   PRAPARE - Administrator, Civil Service (Medical): No    Lack of Transportation (Non-Medical): Yes  Physical Activity: Insufficiently Active (10/02/2023)   Exercise Vital Sign    Days of Exercise per Week: 2 days    Minutes of Exercise per Session: 40 min  Stress: Stress Concern Present (10/02/2023)   [employer] of Occupational Health - Occupational Stress Questionnaire    Feeling of Stress : To some extent  Social Connections: Socially Integrated (10/02/2023)   Social Connection  and Isolation Panel [NHANES]    Frequency of Communication with Friends and Family: More than three times a week    Frequency of Social Gatherings with Friends and Family: Once a week    Attends Religious Services: More than 4 times per year    Active Member of [employer] or Organizations: Yes    Attends Banker Meetings: More than 4 times per year    Marital Status: Living with partner  Intimate Partner Violence: Not At Risk (08/28/2018)   Humiliation, Afraid, Rape, and Kick questionnaire    Fear of Current or Ex-Partner: No    Emotionally Abused: No    Physically Abused: No    Sexually Abused: No     Current Outpatient Medications:    Boric Acid CRYS, Place 600 mg vaginally at bedtime. Use vaginally every night for two weeks then twice a week for six months, Disp: 500 g, Rfl: 5   estradiol (CLIMARA) 0.05 mg/24hr patch, Place 1 patch (0.05 mg total) onto the skin once a week., Disp: 4 patch, Rfl: 12   lisdexamfetamine (VYVANSE) 20 MG capsule, Take 1 capsule (20 mg total) by mouth daily., Disp: 30 capsule, Rfl: 0   lisdexamfetamine (VYVANSE) 20 MG capsule, Take 1 capsule (20 mg total) by mouth daily., Disp: 30 capsule, Rfl: 0   lisdexamfetamine (VYVANSE) 20 MG capsule, Take 1 capsule (20 mg total) by mouth daily., Disp: 30 capsule, Rfl: 0   metoprolol succinate (TOPROL-XL) 50 MG 24 hr tablet, Take with or immediately following a meal., Disp: 90 tablet, Rfl: 1   olmesartan-hydrochlorothiazide (BENICAR HCT) 20-12.5 MG tablet, Take 1 tablet by mouth daily., Disp: 90 tablet, Rfl: 0   Semaglutide-Weight Management (WEGOVY) 1.7 MG/0.75ML SOAJ, Inject 1.7 mg into the skin once a week., Disp: 9 mL, Rfl: 0   Ubrogepant (UBRELVY) 100 MG TABS, Take 1 tablet (100 mg total) by mouth every other day., Disp: 16 tablet, Rfl: 2   venlafaxine XR (EFFEXOR XR) 150 MG 24 hr capsule, Take 1 capsule (150 mg total) by mouth daily with  breakfast., Disp: 90 capsule, Rfl: 0  Allergies  Allergen Reactions    Ace Inhibitors Cough   Latex Itching    Gloves, condoms   Pollen Extract     I personally reviewed active problem list, medication list, allergies with the patient/caregiver today.   ROS  Ten systems reviewed and is negative except as mentioned in HPI    Objective  Virtual encounter, vitals not obtained.  There is no height or weight on file to calculate BMI.  Physical Exam  Awake, alert and oriented  PHQ2/9:    01/08/2024    4:14 PM 11/26/2023    3:39 PM 10/08/2023    1:54 PM 07/06/2023    1:39 PM 05/07/2023    1:37 PM  Depression screen PHQ 2/9  Decreased Interest 0 2 1 3 1   Down, Depressed, Hopeless 0 2 1 1  0  PHQ - 2 Score 0 4 2 4 1   Altered sleeping 0 3 0 3 2  Tired, decreased energy 2 2 3  0 1  Change in appetite 0 1 3 0 1  Feeling bad or failure about yourself  0 1 0 0 0  Trouble concentrating 1 1 3  0 1  Moving slowly or fidgety/restless 0 2 0 0 0  Suicidal thoughts 0 0 0 0 0  PHQ-9 Score 3 14 11 7 6   Difficult doing work/chores  Not difficult at all   Somewhat difficult   PHQ-2/9 Result is negative.    Fall Risk:    01/08/2024    4:14 PM 10/08/2023    1:54 PM 07/06/2023    1:29 PM 05/07/2023    1:28 PM 04/05/2023   11:22 AM  Fall Risk   Falls in the past year? 0 0 0 0 0  Number falls in past yr: 0 0 0  0  Injury with Fall? 0 0 0  0  Risk for fall due to : No Fall Risks No Fall Risks No Fall Risks No Fall Risks No Fall Risks  Follow up Falls prevention discussed Falls prevention discussed Falls prevention discussed Falls prevention discussed Falls prevention discussed     Assessment & Plan  1. HTN (hypertension), benign (Primary)  - olmesartan-hydrochlorothiazide (BENICAR HCT) 20-12.5 MG tablet; Take 1 tablet by mouth daily.  Dispense: 90 tablet; Refill: 1 - metoprolol succinate (TOPROL-XL) 50 MG 24 hr tablet; Take with or immediately following a meal.  Dispense: 90 tablet; Refill: 1  2. Major depression, recurrent, chronic (HCC)  - venlafaxine  XR (EFFEXOR XR) 150 MG 24 hr capsule; Take 1 capsule (150 mg total) by mouth daily with breakfast.  Dispense: 90 capsule; Refill: 1 - lisdexamfetamine (VYVANSE) 20 MG capsule; Take 1 capsule (20 mg total) by mouth daily.  Dispense: 30 capsule; Refill: 0 - lisdexamfetamine (VYVANSE) 20 MG capsule; Take 1 capsule (20 mg total) by mouth daily.  Dispense: 30 capsule; Refill: 0 - lisdexamfetamine (VYVANSE) 20 MG capsule; Take 1 capsule (20 mg total) by mouth daily.  Dispense: 30 capsule; Refill: 0  3. Morbid obesity (HCC)  - Semaglutide-Weight Management (WEGOVY) 2.4 MG/0.75ML SOAJ; Inject 2.4 mg into the skin once a week.  Dispense: 9 mL; Refill: 0  4. 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HCC)  Doing well  5. ADHD (attention deficit hyperactivity disorder), inattentive type  - lisdexamfetamine (VYVANSE) 20 MG capsule; Take 1 capsule (20 mg total) by mouth daily.  Dispense: 30 capsule; Refill: 0 - lisdexamfetamine (VYVANSE) 20 MG capsule; Take 1 capsule (20 mg total) by mouth  daily.  Dispense: 30 capsule; Refill: 0 - lisdexamfetamine (VYVANSE) 20 MG capsule; Take 1 capsule (20 mg total) by mouth daily.  Dispense: 30 capsule; Refill: 0  6. Pre-diabetes  On Wegovy  7. Vitamin D deficiency  Continue supplementation  8. Palpitation  - metoprolol succinate (TOPROL-XL) 50 MG 24 hr tablet; Take with or immediately following a meal.  Dispense: 90 tablet; Refill: 1  9. Vasomotor symptoms due to menopause  - venlafaxine XR (EFFEXOR XR) 150 MG 24 hr capsule; Take 1 capsule (150 mg total) by mouth daily with breakfast.  Dispense: 90 capsule; Refill: 1  10. Migraine without aura and without status migrainosus, not intractable  - Ubrogepant (UBRELVY) 100 MG TABS; Take 1 tablet (100 mg total) by mouth every other day.  Dispense: 16 tablet; Refill: 2 - topiramate (TOPAMAX) 100 MG tablet; Take 1 tablet (100 mg total) by mouth every evening.  Dispense: 90 tablet; Refill: 1   I discussed the assessment and  treatment plan with the patient. The patient was provided an opportunity to ask questions and all were answered. The patient agreed with the plan and demonstrated an understanding of the instructions.  The patient was advised to call back or seek an in-person evaluation if the symptoms worsen or if the condition fails to improve as anticipated.  I provided 25  minutes of non-face-to-face time during this encounter.

## 2024-01-09 ENCOUNTER — Ambulatory Visit: Payer: BC Managed Care – PPO | Admitting: Family Medicine

## 2024-02-20 ENCOUNTER — Encounter: Payer: Self-pay | Admitting: Physician Assistant

## 2024-02-20 ENCOUNTER — Encounter: Payer: Self-pay | Admitting: Nurse Practitioner

## 2024-02-28 ENCOUNTER — Other Ambulatory Visit (HOSPITAL_COMMUNITY): Payer: Self-pay

## 2024-02-28 ENCOUNTER — Other Ambulatory Visit (HOSPITAL_COMMUNITY)
Admission: RE | Admit: 2024-02-28 | Discharge: 2024-02-28 | Disposition: A | Discharge status: Home / Self Care | Source: Ambulatory Visit

## 2024-02-28 ENCOUNTER — Other Ambulatory Visit: Payer: Self-pay

## 2024-02-28 ENCOUNTER — Other Ambulatory Visit (HOSPITAL_COMMUNITY)
Admission: RE | Admit: 2024-02-28 | Discharge: 2024-02-28 | Disposition: A | Discharge status: Home / Self Care | Source: Ambulatory Visit | Attending: Obstetrics & Gynecology | Admitting: Obstetrics & Gynecology

## 2024-02-28 ENCOUNTER — Ambulatory Visit (INDEPENDENT_AMBULATORY_CARE_PROVIDER_SITE_OTHER): Payer: Self-pay | Admitting: Obstetrics & Gynecology

## 2024-02-28 VITALS — BP 136/93 | HR 66

## 2024-02-28 DIAGNOSIS — Z113 Encounter for screening for infections with a predominantly sexual mode of transmission: Secondary | ICD-10-CM

## 2024-02-28 DIAGNOSIS — R87612 Low grade squamous intraepithelial lesion on cytologic smear of cervix (LGSIL): Secondary | ICD-10-CM | POA: Diagnosis present

## 2024-02-28 DIAGNOSIS — R87810 Cervical high risk human papillomavirus (HPV) DNA test positive: Secondary | ICD-10-CM

## 2024-02-28 DIAGNOSIS — E559 Vitamin D deficiency, unspecified: Secondary | ICD-10-CM

## 2024-02-28 DIAGNOSIS — Z0000 Encounter for general adult medical examination without abnormal findings: Secondary | ICD-10-CM

## 2024-02-28 NOTE — Progress Notes (Signed)
    GYNECOLOGY OFFICE COLPOSCOPY PROCEDURE NOTE  48 y.o. [id number] here for colposcopy for low-grade squamous intraepithelial neoplasia (LGSIL - encompassing HPV,mild dysplasia,CIN I) pap smear on 11/26/2023. Discussed role for HPV in cervical dysplasia, need for surveillance. She had a similar pap on 11/23/2022, history of laparoscopic [facility].  Patient gave informed written consent, time out was performed.  Placed in lithotomy position. Cervix viewed with speculum and colposcope after application of acetic acid.   Colposcopy adequate? Yes No visible lesions;  ECC specimen obtained. Specimen labeled and sent to pathology.  Chaperone was present during entire procedure.  Patient was given post procedure instructions.  Will follow up pathology and manage accordingly; patient will be contacted with results and recommendations.  Routine preventative health maintenance measures emphasized.  Of note, patient desires some healthcare maintenance labs and STI screen today, also wanted Vitamin D levels given history of Vitamin D deficiency. Labs ordered, will follow up results and manage accordingly.   Jaynie Collins, MD, FACOG Obstetrician & Gynecologist, [facility] for [employer], [facility]

## 2024-02-28 NOTE — Patient Instructions (Signed)
 COLPOSCOPY POST-PROCEDURE INSTRUCTIONS  You may take Ibuprofen, Aleve or Tylenol for cramping if needed.  If Monsel's solution was used, you will have a black discharge.  Light bleeding is normal.  If bleeding is heavier than your period, please call.  Put nothing in your vagina until the bleeding or discharge stops (usually 2 or 3 days).  We will call you within one week with biopsy results or discuss the results at your follow-up appointment if needed.

## 2024-02-29 ENCOUNTER — Encounter: Payer: Self-pay | Admitting: Obstetrics & Gynecology

## 2024-02-29 ENCOUNTER — Encounter: Payer: Self-pay | Admitting: Family Medicine

## 2024-02-29 ENCOUNTER — Other Ambulatory Visit: Payer: Self-pay

## 2024-02-29 LAB — LIPID PANEL
Chol/HDL Ratio: 3.8 ratio (ref 0.0–4.4)
Cholesterol, Total: 220 mg/dL — ABNORMAL HIGH (ref 100–199)
HDL: 58 mg/dL (ref >39)
LDL Chol Calc (NIH): 146 mg/dL — ABNORMAL HIGH (ref 0–99)
Triglycerides: 89 mg/dL (ref 0–149)
VLDL Cholesterol Cal: 16 mg/dL (ref 5–40)

## 2024-02-29 LAB — COMPREHENSIVE METABOLIC PANEL
ALT: 21 IU/L (ref 0–32)
AST: 30 IU/L (ref 0–40)
Albumin: 4.2 g/dL (ref 3.9–4.9)
Alkaline Phosphatase: 64 IU/L (ref 44–121)
BUN/Creatinine Ratio: 11 (ref 9–23)
BUN: 12 mg/dL (ref 6–24)
Bilirubin Total: 0.4 mg/dL (ref 0.0–1.2)
CO2: 22 mmol/L (ref 20–29)
Calcium: 9.2 mg/dL (ref 8.7–10.2)
Chloride: 105 mmol/L (ref 96–106)
Creatinine, Ser: 1.13 mg/dL — ABNORMAL HIGH (ref 0.57–1.00)
Globulin, Total: 3.2 g/dL (ref 1.5–4.5)
Glucose: 87 mg/dL (ref 70–99)
Potassium: 3.9 mmol/L (ref 3.5–5.2)
Sodium: 141 mmol/L (ref 134–144)
Total Protein: 7.4 g/dL (ref 6.0–8.5)
eGFR: 59 mL/min/{1.73_m2} — ABNORMAL LOW (ref >59)

## 2024-02-29 LAB — HEMOGLOBIN A1C
Est. average glucose Bld gHb Est-mCnc: 117 mg/dL
Hgb A1c MFr Bld: 5.7 % — ABNORMAL HIGH (ref 4.8–5.6)

## 2024-02-29 LAB — CBC
Hematocrit: 44.2 % (ref 34.0–46.6)
Hemoglobin: 14.5 g/dL (ref 11.1–15.9)
MCH: 27.3 pg (ref 26.6–33.0)
MCHC: 32.8 g/dL (ref 31.5–35.7)
MCV: 83 fL (ref 79–97)
Platelets: 310 10*3/uL (ref 150–450)
RBC: 5.32 x10E6/uL — ABNORMAL HIGH (ref 3.77–5.28)
RDW: 14 % (ref 11.7–15.4)
WBC: 6.2 10*3/uL (ref 3.4–10.8)

## 2024-02-29 LAB — RPR+HBSAG+HCVAB+...
HIV Screen 4th Generation wRfx: NONREACTIVE
Hep C Virus Ab: NONREACTIVE
Hepatitis B Surface Ag: NEGATIVE
RPR Ser Ql: NONREACTIVE

## 2024-02-29 LAB — SURGICAL PATHOLOGY

## 2024-02-29 LAB — CERVICOVAGINAL ANCILLARY ONLY
Chlamydia: NEGATIVE
Comment: NEGATIVE
Comment: NEGATIVE
Comment: NORMAL
Neisseria Gonorrhea: NEGATIVE
Trichomonas: NEGATIVE

## 2024-02-29 LAB — VITAMIN D 25 HYDROXY (VIT D DEFICIENCY, FRACTURES): Vit D, 25-Hydroxy: 38.2 ng/mL (ref 30.0–100.0)

## 2024-02-29 LAB — TSH RFX ON ABNORMAL TO FREE T4: TSH: 1.69 u[IU]/mL (ref 0.450–4.500)

## 2024-03-01 ENCOUNTER — Encounter: Payer: Self-pay | Admitting: Obstetrics & Gynecology

## 2024-03-04 ENCOUNTER — Other Ambulatory Visit: Payer: Self-pay

## 2024-03-06 ENCOUNTER — Ambulatory Visit

## 2024-03-06 NOTE — Progress Notes (Signed)
 Patient came in for her weight check for the [facility].

## 2024-03-11 ENCOUNTER — Other Ambulatory Visit: Payer: Self-pay

## 2024-03-14 ENCOUNTER — Ambulatory Visit
Admission: EM | Admit: 2024-03-14 | Discharge: 2024-03-14 | Disposition: A | Discharge status: Home / Self Care | Attending: Physician Assistant | Admitting: Physician Assistant

## 2024-03-14 DIAGNOSIS — M2550 Pain in unspecified joint: Secondary | ICD-10-CM | POA: Diagnosis present

## 2024-03-14 DIAGNOSIS — I10 Essential (primary) hypertension: Secondary | ICD-10-CM | POA: Insufficient documentation

## 2024-03-14 DIAGNOSIS — R591 Generalized enlarged lymph nodes: Secondary | ICD-10-CM | POA: Diagnosis present

## 2024-03-14 DIAGNOSIS — R202 Paresthesia of skin: Secondary | ICD-10-CM | POA: Insufficient documentation

## 2024-03-14 DIAGNOSIS — R5383 Other fatigue: Secondary | ICD-10-CM | POA: Insufficient documentation

## 2024-03-14 LAB — VITAMIN B12: Vitamin B-12: 508 pg/mL (ref 180–914)

## 2024-03-14 MED ORDER — NAPROXEN 500 MG PO TABS: 500.0000 mg | ORAL_TABLET | Freq: Two times a day (BID) | ORAL | 2 refills | Status: DC | PRN

## 2024-03-14 NOTE — ED Provider Notes (Signed)
 [facility]    CSN: [id number] Arrival date & time: 03/14/24  0818      History   Chief Complaint Chief Complaint  Patient presents with   Hand Pain   Back Pain   Knee Pain    HPI Amanda Fields is a 48 y.o. female presenting for multiple complaints. 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   Patient reports history of PCOS and believes she has undiagnosed hypothyroidism, despite multiple negative TSH tests.  She was found out that her daughter was diagnosed with hypothyroidism and has looked her symptoms up online and thinks it is consistent with hypothyroidism. Patient would like more thyroid testing.   Patient reports pain of wrists, hands, and knees for "awhile." Reports cold sensation, numbness and tingling of both hands for "the longest I can remember." Symptoms are getting worse and she is having difficulty sleeping at night. Patient takes tylenol for pain but says it does not help. Has not been checked for carpal tunnel syndrome, but thinks it could be a possibility.   Reports a nodule behind her right ear for awhile.  She also has a small lump above her right knee for unknown amount of time and over the past couple days has developed a painful knot on her left lower back.  No family history of autoimmune disease.   Other medical history significant for allergies, anxiety, depression, obesity, hypertension, insomnia, migraines, allergy induced asthma and prediabetes.  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 medically.   Cough due to ACE inhibitor    Dizziness    Edema leg    Elevated troponin I level 12/31/2015   Endometriosis    Hypertension    a. echo  03/2012 EF 60-65%, moderate LVH, nl LV diastolic fxn, nl PASP; b. 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 03/05/2023   Decreased GFR 03/05/2023   LGSIL on Pap smear of cervix with positive HRHPV on 11/23/2022 and 11/26/2023 01/09/2023   Perennial allergic rhinitis with seasonal variation 09/04/2022   Dyslipidemia 09/04/2022   Vitamin D deficiency 09/04/2022   Migraine without aura and with status migrainosus, not intractable 09/04/2022   Insomnia, persistent 09/04/2022   Major depression, recurrent, chronic (HCC) 09/04/2022   Skin tags, multiple acquired 07/12/2022   ASCUS with positive high risk HPV cervical on 11/22/2021 11/28/2021   Status post laparoscopic supracervical hysterectomy for endometriosis 11/22/2021   Acne vulgaris 06/13/2021   Other hypertrophic disorders of the skin 06/13/2021   Dysplasia of cervix, low grade (CIN 1) 12/15/2020   Chronic rhinitis 08/05/2020   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HCC) 02/06/2020   Cervical dysplasia 11/24/2019   Hypertension due to endocrine disorder 12/10/2018   Pre-diabetes 09/27/2018   Prolapsed internal hemorrhoids, grade 2 08/28/2018   History of endometriosis 04/10/2018   GAD (generalized anxiety disorder) 04/10/2018   Mild recurrent major depression (HCC) 04/10/2018   Obesity (BMI 30.0-34.9) 04/10/2018   ADHD (attention  deficit hyperactivity disorder), 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   Palpitation 07/12/2017   Family history of malignant neoplasm of gastrointestinal tract    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   Allergic rhinitis 02/04/2016   Bilateral leg edema 04/09/2012   HTN (hypertension), benign     Past Surgical History:  Procedure Laterality Date   COLONOSCOPY WITH PROPOFOL N/A 04/09/2017   Procedure: COLONOSCOPY WITH  PROPOFOL;  Surgeon: Midge Minium, MD;  Location: [facility];  Service: Endoscopy;  Laterality: N/A;  LATEX sensitivity   IR GENERIC HISTORICAL  07/07/2016   IR VENOGRAM RENAL UNI LEFT 07/07/2016 Irish Lack, MD [facility]   IR GENERIC HISTORICAL  07/07/2016   IR VENOUS SAMPLING 07/07/2016 Irish Lack, MD [facility]   IR GENERIC HISTORICAL  07/07/2016   IR VENOGRAM ADRENAL BI 07/07/2016 Irish Lack, MD [facility]   IR GENERIC HISTORICAL  07/07/2016   IR US GUIDE VASC ACCESS RIGHT 07/07/2016 Irish Lack, MD [facility]   IR GENERIC HISTORICAL  07/07/2016   IR ANGIOGRAM SELECTIVE EACH ADDITIONAL VESSEL 07/07/2016 Irish Lack, MD [facility]   IR GENERIC HISTORICAL  07/07/2016   IR VENOUS SAMPLING 07/07/2016 Irish Lack, MD [facility]   IR GENERIC HISTORICAL  07/07/2016   IR US GUIDE VASC ACCESS RIGHT 07/07/2016 Irish Lack, MD [facility]   LAPAROSCOPIC SUPRACERVICAL HYSTERECTOMY  09/27/2010   For endometriosis. Ovaries and tubes are in place.   LEEP  03/07/2022   NASAL SEPTUM SURGERY     ROBOTIC ADRENALECTOMY Left 10/27/2016   Procedure: XI ROBOTIC LEFT ADRENALECTOMY;  Surgeon: Karie Soda, MD;  Location: [facility];  Service: General;  Laterality: Left;   TONSILLECTOMY     TUBAL LIGATION  2006   WISDOM TOOTH EXTRACTION      OB History     Gravida  2   Para  2   Term  2   Preterm      AB      Living  2      SAB      IAB      Ectopic      Multiple      Live Births  2            Home Medications    Prior to Admission medications   Medication Sig Start Date End Date Taking? Authorizing Provider  lisdexamfetamine (VYVANSE) 20 MG capsule Take 1 capsule (20 mg total) by mouth daily. 01/08/24  Yes Sowles, Danna Hefty, MD  metoprolol succinate (TOPROL-XL) 50 MG 24 hr tablet Take with or immediately following a meal. 01/08/24  Yes Sowles, Danna Hefty, MD  naproxen (NAPROSYN) 500 MG tablet Take 1 tablet (500 mg total) by mouth 2 (two)  times daily as needed. 03/14/24  Yes Shirlee Latch, PA-C  olmesartan-hydrochlorothiazide (BENICAR HCT) 20-12.5 MG tablet Take 1 tablet by mouth daily. 01/08/24  Yes Sowles, Danna Hefty, MD  Semaglutide-Weight Management (WEGOVY) 2.4 MG/0.75ML SOAJ Inject 2.4 mg into the skin once a week. 01/08/24  Yes Sowles, Danna Hefty, MD  venlafaxine XR (EFFEXOR XR) 150 MG 24 hr capsule Take 1 capsule (150 mg total) by mouth daily with breakfast. 01/08/24  Yes Sowles, Danna Hefty, MD  Boric Acid CRYS Place 600 mg vaginally at bedtime. Use vaginally every night for two weeks then twice a week for six months 05/01/23   Anyanwu, Jethro Bastos, MD  estradiol (CLIMARA) 0.05 mg/24hr patch Place 1 patch (0.05 mg total) onto the skin once a week. 09/11/23   Anyanwu, Jethro Bastos, MD  lisdexamfetamine (VYVANSE)  20 MG capsule Take 1 capsule (20 mg total) by mouth daily. 01/08/24   Alba Cory, MD  lisdexamfetamine (VYVANSE) 20 MG capsule Take 1 capsule (20 mg total) by mouth daily. 01/08/24   Alba Cory, MD  topiramate (TOPAMAX) 100 MG tablet Take 1 tablet (100 mg total) by mouth every evening. 01/08/24   Carlynn Purl, Danna Hefty, MD  Ubrogepant (UBRELVY) 100 MG TABS Take 1 tablet (100 mg total) by mouth every other day. 01/08/24   Alba Cory, MD    Family History Family History  Problem Relation Age of Onset   COPD Mother    Hypotension Mother    Colon cancer Father    Emphysema Father    Cancer Father        colon   Hyperlipidemia Sister    Breast cancer Maternal Aunt        x2 times ? age of dx   Heart failure Paternal Uncle    Ovarian cancer Maternal Grandmother    Heart attack Cousin 32   Heart disease Cousin        heart attack   Adrenal disorder Neg Hx     Social History Social History   Tobacco Use   Smoking status: Never    Passive exposure: Never   Smokeless tobacco: Never  Vaping Use   Vaping status: Never Used  Substance Use Topics   Alcohol use: Yes    Comment: social (1 drink/mo)   Drug use: No      Allergies   Ace inhibitors, Latex, and Pollen extract   Review of Systems Review of Systems  Constitutional:  Positive for fatigue. Negative for fever.  HENT:  Positive for congestion and rhinorrhea. Negative for ear pain and sore throat.   Respiratory:  Negative for cough and shortness of breath.   Cardiovascular:  Negative for chest pain and palpitations.  Gastrointestinal:  Negative for abdominal pain, nausea and vomiting.  Endocrine: Positive for cold intolerance.  Musculoskeletal:  Positive for arthralgias and back pain. Negative for gait problem, joint swelling and neck pain.  Skin:  Negative for color change and rash.  Allergic/Immunologic: Positive for environmental allergies.  Neurological:  Positive for numbness. Negative for weakness and headaches.  Hematological:  Positive for adenopathy.     Physical Exam Triage Vital Signs ED Triage Vitals  Encounter Vitals Group     BP 03/14/24 0835 (!) 151/103     Systolic BP Percentile --      Diastolic BP Percentile --      Pulse Rate 03/14/24 0835 60     Resp 03/14/24 0835 15     Temp 03/14/24 0835 97.7 F (36.5 C)     Temp Source 03/14/24 0835 Oral     SpO2 03/14/24 0835 96 %     Weight --      Height --      Head Circumference --      Peak Flow --      Pain Score 03/14/24 0834 7     Pain Loc --      Pain Education --      Exclude from Growth Chart --    No data found.  Updated Vital Signs BP (!) 151/103 (BP Location: Left Arm) Comment: just took meds  Pulse 60   Temp 97.7 F (36.5 C) (Oral)   Resp 15   SpO2 96%      Physical Exam Vitals and nursing note reviewed.  Constitutional:      General: She is  not in acute distress.    Appearance: Normal appearance. She is not ill-appearing or toxic-appearing.  HENT:     Head: Normocephalic and atraumatic.     Right Ear: Tympanic membrane, ear canal and external ear normal.     Left Ear: Tympanic membrane, ear canal and external ear normal.     Nose:  Congestion present.     Mouth/Throat:     Mouth: Mucous membranes are moist.     Pharynx: Oropharynx is clear.  Eyes:     General: No scleral icterus.       Right eye: No discharge.        Left eye: No discharge.     Conjunctiva/sclera: Conjunctivae normal.  Cardiovascular:     Rate and Rhythm: Normal rate and regular rhythm.     Heart sounds: Normal heart sounds.  Pulmonary:     Effort: Pulmonary effort is normal. No respiratory distress.     Breath sounds: Normal breath sounds.  Musculoskeletal:     Cervical back: Neck supple.     Comments: No swelling or tenderness of knees and full ROM of knees.   No spinal tenderness of back. Small firm mass of left lower back which may be consistent with muscle spasm.   No tenderness of hands or wrists and full ROM. +tinels on right with tingling into the 4th digit.  Lymphadenopathy:     Cervical: Cervical adenopathy (small right posterior auricular lymph node. Tiny cyst on right posterior ear) present.  Skin:    General: Skin is dry.  Neurological:     General: No focal deficit present.     Mental Status: She is alert. Mental status is at baseline.     Motor: No weakness.     Coordination: Coordination normal.     Gait: Gait normal.  Psychiatric:        Mood and Affect: Mood normal.        Behavior: Behavior normal.      UC Treatments / Results  Labs (all labs ordered are listed, but only abnormal results are displayed) Labs Reviewed  VITAMIN B12  THYROID PANEL WITH TSH  ANA W/REFLEX IF POSITIVE  RHEUMATOID FACTOR    EKG   Radiology No results found.  Procedures Procedures (including critical care time)  Medications Ordered in UC Medications - No data to display  Initial Impression / Assessment and Plan / UC Course  I have reviewed the triage vital signs and the nursing notes.  Pertinent labs & imaging results that were available during my care of the patient were reviewed by me and considered in my medical  decision making (see chart for details).   48 year old female presents with multiple complaints including fatigue, bilateral knee pain, tingling and numbness into hands and wrists, lymph node behind right ear for "a while."  Also reports tender lump of left lower back for the past couple days.   Patient was seen by her PCP a couple weeks ago and I reviewed previous notes and lab work from this month.  Labs obtained on 02/28/2024 include STI testing, vitamin D, lipid panel, CBC, CMP, TSH, A1c.  Patient found to have elevated cholesterol, mildly elevated A1c at 5.7, negative STI testing, normal TSH, and no significant findings on CBC and CMP.  BP elevated at 151/103.  Patient is on olmesartan and metoprolol.  Advised to continue meds and keep a log of blood pressure.  If consistently greater than 140/90 should discuss with PCP.  Labs obtained  today include ANA with reflex, thyroid panel with TSH, rheumatoid factor and B12.  Patient will be contacted with results by member and nursing staff.  May be referred to endocrinology or rheumatology based on results.  Explained that if results are negative she should discuss with PCP obtaining screening for carpal tunnel syndrome and also discussed ultrasound of the lymph node behind her right ear since it has been present for a while.  Of course of lymph node swelling could be related to allergies as she has chronic rhinitis.  Send Naproxen to pharmacy to help with pain and inflammation.  Discussed Tylenol, reviewed RICE guidelines.  Reviewed return precautions.   Final Clinical Impressions(s) / UC Diagnoses   Final diagnoses:  Polyarthralgia  Other fatigue  Paresthesia of both hands  Lymphadenopathy of head and neck  Essential hypertension     Discharge Instructions      -We are checking multiple labs and results will be back in a few days. You will be contacted with results by a member of nursing staff and depending on results, may be advised  different treatment or referral may be placed. - I sent naproxen to use as needed for aches and pains. - Try to avoid painful activities. - Follow-up with PCP next month as scheduled and discussed with them obtaining an ultrasound of the lymph node behind your ear and the spot on your knee.  Also discussed workup for possible carpal tunnel syndrome.     ED Prescriptions     Medication Sig Dispense Auth. Provider   naproxen (NAPROSYN) 500 MG tablet Take 1 tablet (500 mg total) by mouth 2 (two) times daily as needed. 30 tablet Gareth Morgan      PDMP not reviewed this encounter.   Shirlee Latch, PA-C 03/14/24 [phone]

## 2024-03-14 NOTE — Discharge Instructions (Addendum)
-  We are checking multiple labs and results will be back in a few days. You will be contacted with results by a member of nursing staff and depending on results, may be advised different treatment or referral may be placed. - I sent naproxen to use as needed for aches and pains. - Try to avoid painful activities. - Follow-up with PCP next month as scheduled and discussed with them obtaining an ultrasound of the lymph node behind your ear and the spot on your knee.  Also discussed workup for possible carpal tunnel syndrome.

## 2024-03-14 NOTE — ED Triage Notes (Signed)
 Patient states that hands stay cold, tingling and numb. Patient states that she has hypothyroidism. Patient states that she has bilateral knee pain. Patient states that she has a nodule on the right side of her neck behind her ear and pain in her lower back.

## 2024-03-15 LAB — ANA W/REFLEX IF POSITIVE: Anti Nuclear Antibody (ANA): NEGATIVE

## 2024-03-16 ENCOUNTER — Encounter: Payer: Self-pay | Admitting: Family Medicine

## 2024-03-16 LAB — RHEUMATOID FACTOR: Rheumatoid fact SerPl-aCnc: <10 [IU]/mL (ref <14.0)

## 2024-03-17 ENCOUNTER — Other Ambulatory Visit: Payer: Self-pay

## 2024-03-20 ENCOUNTER — Encounter: Payer: Self-pay | Admitting: Family Medicine

## 2024-03-20 ENCOUNTER — Ambulatory Visit: Admitting: Family Medicine

## 2024-03-20 ENCOUNTER — Other Ambulatory Visit: Payer: Self-pay | Admitting: Family Medicine

## 2024-03-20 VITALS — BP 144/86 | HR 64 | Resp 16 | Ht 66.0 in | Wt 194.4 lb

## 2024-03-20 DIAGNOSIS — R202 Paresthesia of skin: Secondary | ICD-10-CM

## 2024-03-20 DIAGNOSIS — I10 Essential (primary) hypertension: Secondary | ICD-10-CM | POA: Diagnosis not present

## 2024-03-20 DIAGNOSIS — M25472 Effusion, left ankle: Secondary | ICD-10-CM

## 2024-03-20 DIAGNOSIS — M2550 Pain in unspecified joint: Secondary | ICD-10-CM | POA: Diagnosis not present

## 2024-03-20 MED ORDER — POTASSIUM CHLORIDE CRYS ER 20 MEQ PO TBCR: 20.0000 meq | EXTENDED_RELEASE_TABLET | Freq: Every day | ORAL | 0 refills | Status: DC

## 2024-03-20 MED ORDER — FUROSEMIDE 20 MG PO TABS: 20.0000 mg | ORAL_TABLET | Freq: Every day | ORAL | 0 refills | Status: DC | PRN

## 2024-03-20 NOTE — Progress Notes (Signed)
 Name: Amanda Fields   MRN: [id number]    DOB: June 03, 1976   Date:03/20/2024       Progress Note  Subjective  Chief Complaint  Chief Complaint  Patient presents with   Results   HPI   Discussed the use of AI scribe software for clinical note transcription with the patient, who gave verbal consent to proceed.  History of Present Illness Amanda Fields is a 48 year old female who presents with joint pain and swelling.  She experiences pain in multiple joints, including her hands, back, and knee. The joint pain is random, affecting any joint, with her hands feeling cold and sometimes achy. In the mornings, her joints are numb and achy, waking her from sleep. Her knees sometimes swell and get hot, particularly the right knee, which she injured in track. Her hands experience tingling and aching, with occasional swelling making it difficult to remove rings. The swelling is more pronounced in her fingers than her hands.  She has been experiencing left ankle swelling for a while, which she describes as significant. The swelling is primarily in the ankle, with no associated calf pain. She takes Benicar HCTZ at a low dose of hydrochlorothiazide at 12.5 mg and uses compression stockings for work. The left ankle swelling is consistently worse than the right. She also has intermittent right arm swelling that can extent to to her right elbow, causing pain and visible vein protrusion.  She has a history of weight gain and has not been taking [place] since January due to insurance issues. Her insurance does not cover weight loss medications. She has been without Wegovy for several months now but has gained only a few pounds since 09/2023   Her previous workup included normal results for rheumatoid factor, ANA, and B12. A TSH test was pending as of March 14, 2024. She had a Doppler ultrasound a few years ago, which was normal. No stiffness upon waking and no calf pain. She takes over-the-counter potassium  supplements. Her B12 levels are normal.      Patient Active Problem List   Diagnosis Date Noted   Vasomotor symptoms due to menopause 03/05/2023   Decreased GFR 03/05/2023   LGSIL on Pap smear of cervix with positive HRHPV on 11/23/2022 and 11/26/2023 01/09/2023   Perennial allergic rhinitis with seasonal variation 09/04/2022   Dyslipidemia 09/04/2022   Vitamin D deficiency 09/04/2022   Migraine without aura and with status migrainosus, not intractable 09/04/2022   Insomnia, persistent 09/04/2022   Major depression, recurrent, chronic (HCC) 09/04/2022   Skin tags, multiple acquired 07/12/2022   ASCUS with positive high risk HPV cervical on 11/22/2021 11/28/2021   Status post laparoscopic supracervical hysterectomy for endometriosis 11/22/2021   Acne vulgaris 06/13/2021   Other hypertrophic disorders of the skin 06/13/2021   Dysplasia of cervix, low grade (CIN 1) 12/15/2020   Chronic rhinitis 08/05/2020   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HCC) 02/06/2020   Cervical dysplasia 11/24/2019   Hypertension due to endocrine disorder 12/10/2018   Pre-diabetes 09/27/2018   Prolapsed internal hemorrhoids, grade 2 08/28/2018   History of endometriosis 04/10/2018   GAD (generalized anxiety disorder) 04/10/2018   Mild recurrent major depression (HCC) 04/10/2018   Obesity (BMI 30.0-34.9) 04/10/2018   ADHD (attention deficit hyperactivity disorder), 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   Palpitation 07/12/2017   Family history of malignant neoplasm of gastrointestinal tract    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   Allergic rhinitis 02/04/2016   Bilateral leg edema 04/09/2012   HTN (hypertension), benign  Past Surgical History:  Procedure Laterality Date   COLONOSCOPY WITH PROPOFOL N/A 04/09/2017   Procedure: COLONOSCOPY WITH PROPOFOL;  Surgeon: Midge Minium, MD;  Location: [facility];  Service: Endoscopy;  Laterality: N/A;  LATEX sensitivity   IR GENERIC HISTORICAL  07/07/2016   IR VENOGRAM RENAL UNI  LEFT 07/07/2016 Irish Lack, MD [facility]   IR GENERIC HISTORICAL  07/07/2016   IR VENOUS SAMPLING 07/07/2016 Irish Lack, MD [facility]   IR GENERIC HISTORICAL  07/07/2016   IR VENOGRAM ADRENAL BI 07/07/2016 Irish Lack, MD [facility]   IR GENERIC HISTORICAL  07/07/2016   IR US GUIDE VASC ACCESS RIGHT 07/07/2016 Irish Lack, MD [facility]   IR GENERIC HISTORICAL  07/07/2016   IR ANGIOGRAM SELECTIVE EACH ADDITIONAL VESSEL 07/07/2016 Irish Lack, MD [facility]   IR GENERIC HISTORICAL  07/07/2016   IR VENOUS SAMPLING 07/07/2016 Irish Lack, MD [facility]   IR GENERIC HISTORICAL  07/07/2016   IR US GUIDE VASC ACCESS RIGHT 07/07/2016 Irish Lack, MD [facility]   LAPAROSCOPIC SUPRACERVICAL HYSTERECTOMY  09/27/2010   For endometriosis. Ovaries and tubes are in place.   LEEP  03/07/2022   NASAL SEPTUM SURGERY     ROBOTIC ADRENALECTOMY Left 10/27/2016   Procedure: XI ROBOTIC LEFT ADRENALECTOMY;  Surgeon: Karie Soda, MD;  Location: [facility];  Service: General;  Laterality: Left;   TONSILLECTOMY     TUBAL LIGATION  2006   WISDOM TOOTH EXTRACTION      Family History  Problem Relation Age of Onset   COPD Mother    Hypotension Mother    Colon cancer Father    Emphysema Father    Cancer Father        colon   Hyperlipidemia Sister    Breast cancer Maternal Aunt        x2 times ? age of dx   Heart failure Paternal Uncle    Ovarian cancer Maternal Grandmother    Heart attack Cousin 32   Heart disease Cousin        heart attack   Adrenal disorder Neg Hx     Social History   Tobacco Use   Smoking status: Never    Passive exposure: Never   Smokeless tobacco: Never  Substance Use Topics   Alcohol use: Yes    Comment: social (1 drink/mo)     Current Outpatient Medications:    Boric Acid CRYS, Place 600 mg vaginally at bedtime. Use vaginally every night for two weeks then twice a week for six months, Disp: 500 g, Rfl: 5   estradiol (CLIMARA) 0.05  mg/24hr patch, Place 1 patch (0.05 mg total) onto the skin once a week., Disp: 4 patch, Rfl: 12   furosemide (LASIX) 20 MG tablet, Take 1 tablet (20 mg total) by mouth daily as needed., Disp: 30 tablet, Rfl: 0   lisdexamfetamine (VYVANSE) 20 MG capsule, Take 1 capsule (20 mg total) by mouth daily., Disp: 30 capsule, Rfl: 0   lisdexamfetamine (VYVANSE) 20 MG capsule, Take 1 capsule (20 mg total) by mouth daily., Disp: 30 capsule, Rfl: 0   lisdexamfetamine (VYVANSE) 20 MG capsule, Take 1 capsule (20 mg total) by mouth daily., Disp: 30 capsule, Rfl: 0   metoprolol succinate (TOPROL-XL) 50 MG 24 hr tablet, Take with or immediately following a meal., Disp: 90 tablet, Rfl: 1   naproxen (NAPROSYN) 500 MG tablet, Take 1 tablet (500 mg total) by mouth 2 (two) times daily as needed., Disp: 30 tablet, Rfl: 2  olmesartan-hydrochlorothiazide (BENICAR HCT) 20-12.5 MG tablet, Take 1 tablet by mouth daily., Disp: 90 tablet, Rfl: 1   potassium chloride SA (KLOR-CON M) 20 MEQ tablet, Take 1 tablet (20 mEq total) by mouth daily. When you take furosemide, Disp: 30 tablet, Rfl: 0   topiramate (TOPAMAX) 100 MG tablet, Take 1 tablet (100 mg total) by mouth every evening., Disp: 90 tablet, Rfl: 1   Ubrogepant (UBRELVY) 100 MG TABS, Take 1 tablet (100 mg total) by mouth every other day., Disp: 16 tablet, Rfl: 2   venlafaxine XR (EFFEXOR XR) 150 MG 24 hr capsule, Take 1 capsule (150 mg total) by mouth daily with breakfast., Disp: 90 capsule, Rfl: 1  Allergies  Allergen Reactions   Ace Inhibitors Cough   Latex Itching    Gloves, condoms   Pollen Extract     I personally reviewed active problem list, medication list, allergies, family history with the patient/caregiver today.   ROS  Ten systems reviewed and is negative except as mentioned in HPI    Objective Physical Exam CONSTITUTIONAL: Patient appears well-developed and well-nourished. No distress. HEENT: Head atraumatic, normocephalic, neck  supple. CARDIOVASCULAR: Normal rate, regular rhythm and normal heart sounds. No murmur heard. Left ankle 1+ edema, right ankle without edema and good range of motion. PULMONARY: Effort normal and breath sounds normal. No respiratory distress. ABDOMINAL: There is no tenderness or distention. MUSCULOSKELETAL: Normal gait. Without gross motor or sensory deficit. No synovitis noticed, no erythema of joints. Negative Tinel signs PSYCHIATRIC: Patient has a normal mood and affect. Behavior is normal. Judgment and thought content normal.  Vitals:   03/20/24 0821  BP: (!) 144/86  Pulse: 64  Resp: 16  Weight: 194 lb 6.4 oz (88.2 kg)  Height: 5\' 6"  (1.676 m)    Body mass index is 31.38 kg/m.  Recent Results (from the past 2160 hours)  Hemoglobin A1c     Status: Abnormal   Collection Time: 02/28/24 12:00 AM  Result Value Ref Range   Hgb A1c MFr Bld 5.7 (H) 4.8 - 5.6 %    Comment:          Prediabetes: 5.7 - 6.4          Diabetes: >6.4          Glycemic control for adults with diabetes: <7.0    Est. average glucose Bld gHb Est-mCnc 117 mg/dL  TSH Rfx on Abnormal to Free T4     Status: None   Collection Time: 02/28/24 12:00 AM  Result Value Ref Range   TSH 1.690 0.450 - 4.500 uIU/mL  Comprehensive metabolic panel     Status: Abnormal   Collection Time: 02/28/24 12:00 AM  Result Value Ref Range   Glucose 87 70 - 99 mg/dL   BUN 12 6 - 24 mg/dL   Creatinine, Ser 1.61 (H) 0.57 - 1.00 mg/dL   eGFR 59 (L) >09 UE/AVW/0.98   BUN/Creatinine Ratio 11 9 - 23   Sodium 141 134 - 144 mmol/L   Potassium 3.9 3.5 - 5.2 mmol/L   Chloride 105 96 - 106 mmol/L   CO2 22 20 - 29 mmol/L   Calcium 9.2 8.7 - 10.2 mg/dL   Total Protein 7.4 6.0 - 8.5 g/dL   Albumin 4.2 3.9 - 4.9 g/dL   Globulin, Total 3.2 1.5 - 4.5 g/dL   Bilirubin Total 0.4 0.0 - 1.2 mg/dL   Alkaline Phosphatase 64 44 - 121 IU/L   AST 30 0 - 40 IU/L   ALT 21  0 - 32 IU/L  CBC     Status: Abnormal   Collection Time: 02/28/24 12:00 AM   Result Value Ref Range   WBC 6.2 3.4 - 10.8 x10E3/uL   RBC 5.32 (H) 3.77 - 5.28 x10E6/uL   Hemoglobin 14.5 11.1 - 15.9 g/dL   Hematocrit 96.0 45.4 - 46.6 %   MCV 83 79 - 97 fL   MCH 27.3 26.6 - 33.0 pg   MCHC 32.8 31.5 - 35.7 g/dL   RDW 09.8 11.9 - 14.7 %   Platelets 310 150 - 450 x10E3/uL  Lipid panel     Status: Abnormal   Collection Time: 02/28/24 12:00 AM  Result Value Ref Range   Cholesterol, Total 220 (H) 100 - 199 mg/dL   Triglycerides 89 0 - 149 mg/dL   HDL 58 >82 mg/dL   VLDL Cholesterol Cal 16 5 - 40 mg/dL   LDL Chol Calc (NIH) 956 (H) 0 - 99 mg/dL   Chol/HDL Ratio 3.8 0.0 - 4.4 ratio    Comment:                                   T. Chol/HDL Ratio                                             Men  Women                               1/2 Avg.Risk  3.4    3.3                                   Avg.Risk  5.0    4.4                                2X Avg.Risk  9.6    7.1                                3X Avg.Risk 23.4   11.0   VITAMIN D 25 Hydroxy (Vit-D Deficiency, Fractures)     Status: None   Collection Time: 02/28/24 12:00 AM  Result Value Ref Range   Vit D, 25-Hydroxy 38.2 30.0 - 100.0 ng/mL    Comment: Vitamin D deficiency has been defined by the Institute of Medicine and an Endocrine Society practice guideline as a level of serum 25-OH vitamin D less than 20 ng/mL (1,2). The Endocrine Society went on to further define vitamin D insufficiency as a level between 21 and 29 ng/mL (2). 1. IOM (Institute of Medicine). 2010. Dietary reference    intakes for calcium and D. [place]: The    [employer]. 2. Holick MF, Binkley Draper, Bischoff-Ferrari HA, et al.    Evaluation, treatment, and prevention of vitamin D    deficiency: an Endocrine Society clinical practice    guideline. JCEM. 2011 Jul; 96(7):1911-30.   RPR+HBsAg+HCVAb+...     Status: None   Collection Time: 02/28/24 12:00 AM  Result Value Ref Range   Hepatitis B Surface Ag Negative Negative  Hep C Virus Ab Non Reactive Non Reactive    Comment: HCV antibody alone does not differentiate between previously resolved infection and active infection. Equivocal and Reactive HCV antibody results should be followed up with an HCV RNA test to support the diagnosis of active HCV infection.    RPR Ser Ql Non Reactive Non Reactive   HIV Screen 4th Generation wRfx Non Reactive Non Reactive    Comment: HIV-1/HIV-2 antibodies and HIV-1 p24 antigen were NOT detected. There is no laboratory evidence of HIV infection. HIV Negative   Cervicovaginal ancillary only     Status: None   Collection Time: 02/28/24 10:57 AM  Result Value Ref Range   Neisseria Gonorrhea Negative    Chlamydia Negative    Trichomonas Negative    Comment Normal Reference Range Trichomonas - Negative    Comment Normal Reference Ranger Chlamydia - Negative    Comment      Normal Reference Range Neisseria Gonorrhea - Negative  Surgical pathology     Status: None   Collection Time: 02/28/24 11:16 AM  Result Value Ref Range   SURGICAL PATHOLOGY      SURGICAL PATHOLOGY CASE: [id number] PATIENT: [facility] Deutschman Surgical Pathology Report     Clinical History: LGSIL (cm)     FINAL MICROSCOPIC DIAGNOSIS:  A. ENDOCERVIX, CURETTAGE: - Low-grade squamous intraepithelial lesion (L SIL/CIN-1)   GROSS DESCRIPTION:  The specimen is received in formalin on multiple wire brushes, and consists of a 1.1 x 1.0 x 0.2 cm aggregate of tan-red soft tissue and mucus.  Specimen is entirely submitted in 1 cassette.  Lovey Newcomer 02/28/2024)  Final Diagnosis performed by Clifton James, MD.   Electronically signed 02/29/2024 Technical component performed at [employer]. [facility], [address] [place], [place] [address].  Professional component performed at [facility], [address] [place], [place] [address].  Immunohistochemistry Technical component (if applicable) was performed at [facility]. [address], [place], [place] [address].   IMMUNOHISTOCHEMISTRY DISCLAIMER  (if applicable): Some of these immunohistochemical stains may have been developed and the performance characteristics determine by [facility]. Some may not have been cleared or approved by the U.S. Food and Drug Administration. The FDA has determined that such clearance or approval is not necessary. This test is used for clinical purposes. It should not be regarded as investigational or for research. This laboratory is certified under the Clinical Laboratory Improvement Amendments of 1988 (CLIA-88) as qualified to perform high complexity clinical laboratory testing.  The controls stained appropriately.   IHC stains are performed on formalin fixed, paraffin embedded tissue using a 3,3"diaminobenzidine (DAB) chromogen and Leica Bond Autostainer System. The staining intensity of the nucleus is score manually and is reported as the percentage of tumor cell nuclei demonstrating specific nuclear staining. The specimens are fixed in 10% Neutral Formalin for at leas t 6 hours and up to 72hrs. These tests are validated on decalcified tissue. Results should be interpreted with caution given the possibility of false negative results on decalcified specimens. Antibody Clones are as follows ER-clone 71F, PR-clone 16, Ki67- clone MM1. Some of these immunohistochemical stains may have been developed and the performance characteristics determined by [facility].   Vitamin B12     Status: None   Collection Time: 03/14/24  9:06 AM  Result Value Ref Range   Vitamin B-12 508 180 - 914 pg/mL    Comment: (NOTE) This assay is not validated for testing neonatal or myeloproliferative  syndrome specimens for Vitamin B12 levels. Performed at [facility], [address] [place], [place] [address]   ANA w/Reflex if Positive     Status: None   Collection Time: 03/14/24  9:06  AM  Result Value Ref Range   Anti Nuclear Antibody (ANA) Negative Negative    Comment: (NOTE) Performed At: [facility] [address] [place], [place] [id number] Jolene Schimke MD XB:[id number]   Rheumatoid factor     Status: None   Collection Time: 03/14/24  9:06 AM  Result Value Ref Range   Rheumatoid fact SerPl-aCnc <10.0 <14.0 IU/mL    Comment: (NOTE) Performed At: [facility] [address] [place], [place] [id number] Jolene Schimke MD [id number]     Diabetic Foot Exam:     PHQ2/9:    03/20/2024    8:20 AM 01/08/2024    4:14 PM 11/26/2023    3:39 PM 10/08/2023    1:54 PM 07/06/2023    1:39 PM  Depression screen PHQ 2/9  Decreased Interest 3 0 2 1 3   Down, Depressed, Hopeless 3 0 2 1 1   PHQ - 2 Score 6 0 4 2 4   Altered sleeping 3 0 3 0 3  Tired, decreased energy 3 2 2 3  0  Change in appetite 0 0 1 3 0  Feeling bad or failure about yourself  0 0 1 0 0  Trouble concentrating 3 1 1 3  0  Moving slowly or fidgety/restless 0 0 2 0 0  Suicidal thoughts 0 0 0 0 0  PHQ-9 Score 15 3 14 11 7   Difficult doing work/chores Somewhat difficult  Not difficult at all      phq 9 is positive  Fall Risk:    01/08/2024    4:14 PM 10/08/2023    1:54 PM 07/06/2023    1:29 PM 05/07/2023    1:28 PM 04/05/2023   11:22 AM  Fall Risk   Falls in the past year? 0 0 0 0 0  Number falls in past yr: 0 0 0  0  Injury with Fall? 0 0 0  0  Risk for fall due to : No Fall Risks No Fall Risks No Fall Risks No Fall Risks No Fall Risks  Follow up Falls prevention discussed Falls prevention discussed Falls prevention discussed Falls prevention discussed Falls prevention discussed     Assessment & Plan Joint Pain Joint pain in hands, back, and knees with nocturnal numbness and swelling. Normal rheumatoid factor, ANA, and B12. Suspected rheumatological issue. - Order inflammatory markers (sed rate and C-reactive protein). - Refer to rheumatologist if  labs above positive or if  symptoms persists  Paresthesia in Hands Tingling and numbness in hands with normal B12 levels. Possible neurologic issue. - Refer to neurologist for nerve conduction study.  Left Ankle Edema Chronic left ankle edema, larger than right, without pain or redness. Normal Doppler ultrasound years ago. Possible vascular issue - Prescribe Lasix as needed for swelling, with potassium supplementation. - Advise use of compression stockings, especially during work. - Consider referral to vascular specialist if symptoms persist.  Hypertension Slightly elevated blood pressure. Lasix may assist in control. Advised salt reduction. - Advise reduction of salt intake, especially from processed foods. - Monitor blood pressure regularly.  Obesity Off Wegovy due to insurance.  Discussed Contrave as alternative. - Discuss alternative weight loss medication options, such as Contrave. - Advise checking insurance coverage for weight loss medications and we can initiate therapy during her next  follow up

## 2024-03-21 ENCOUNTER — Encounter: Payer: Self-pay | Admitting: Family Medicine

## 2024-03-21 LAB — SEDIMENTATION RATE: Sed Rate: 17 mm/h (ref 0–20)

## 2024-03-21 LAB — C-REACTIVE PROTEIN: CRP: 10.5 mg/L — ABNORMAL HIGH (ref <8.0)

## 2024-03-28 ENCOUNTER — Encounter: Payer: Self-pay | Admitting: Family Medicine

## 2024-04-09 ENCOUNTER — Other Ambulatory Visit: Payer: Self-pay

## 2024-04-11 ENCOUNTER — Ambulatory Visit: Admitting: Family Medicine

## 2024-05-04 ENCOUNTER — Other Ambulatory Visit: Payer: Self-pay | Admitting: Family Medicine

## 2024-05-04 DIAGNOSIS — I10 Essential (primary) hypertension: Secondary | ICD-10-CM

## 2024-05-04 DIAGNOSIS — M25472 Effusion, left ankle: Secondary | ICD-10-CM

## 2024-05-26 ENCOUNTER — Ambulatory Visit: Admitting: Family Medicine

## 2024-05-31 ENCOUNTER — Telehealth: Admitting: Family

## 2024-05-31 DIAGNOSIS — J069 Acute upper respiratory infection, unspecified: Secondary | ICD-10-CM

## 2024-05-31 MED ORDER — PROMETHAZINE-DM 6.25-15 MG/5ML PO SYRP: 5.0000 mL | ORAL_SOLUTION | Freq: Three times a day (TID) | ORAL | 0 refills | Status: DC | PRN

## 2024-05-31 MED ORDER — BENZONATATE 200 MG PO CAPS: 200.0000 mg | ORAL_CAPSULE | Freq: Two times a day (BID) | ORAL | 0 refills | Status: DC | PRN

## 2024-05-31 MED ORDER — FLUTICASONE PROPIONATE 50 MCG/ACT NA SUSP: 2.0000 | Freq: Every day | NASAL | 6 refills | Status: DC

## 2024-05-31 NOTE — Progress Notes (Signed)
 Virtual Visit Consent   Amanda Fields, you are scheduled for a virtual visit with a [facility] provider today. Just as with appointments in the office, your consent must be obtained to participate. Your consent will be active for this visit and any virtual visit you may have with one of our providers in the next 365 days. If you have a MyChart account, a copy of this consent can be sent to you electronically.  As this is a virtual visit, video technology does not allow for your provider to perform a traditional examination. This may limit your provider's ability to fully assess your condition. If your provider identifies any concerns that need to be evaluated in person or the need to arrange testing (such as labs, EKG, etc.), we will make arrangements to do so. Although advances in technology are sophisticated, we cannot ensure that it will always work on either your end or our end. If the connection with a video visit is poor, the visit may have to be switched to a telephone visit. With either a video or telephone visit, we are not always able to ensure that we have a secure connection.  By engaging in this virtual visit, you consent to the provision of healthcare and authorize for your insurance to be billed (if applicable) for the services provided during this visit. Depending on your insurance coverage, you may receive a charge related to this service.  I need to obtain your verbal consent now. Are you willing to proceed with your visit today? DEMANI WEYRAUCH has provided verbal consent on 05/31/2024 for a virtual visit (video or telephone). Tommas Fragmin, FNP  Date: 05/31/2024 4:56 PM   Virtual Visit via Video Note   I, Tommas Fragmin, connected with  MARYELIZABETH EBERLE  ([id number], 1976/08/10) on 05/31/24 at  5:00 PM EDT by a video-enabled telemedicine application and verified that I am speaking with the correct person using two identifiers.  Location: Patient: Virtual Visit Location Patient:  Home Provider: Virtual Visit Location Provider: Home Office   I discussed the limitations of evaluation and management by telemedicine and the availability of in person appointments. The patient expressed understanding and agreed to proceed.    History of Present Illness: Amanda Fields is a 48 y.o. who identifies as a female who was assigned female at birth, and is being seen today for congestion, sore throat, and ear fullness that started four days ago.  HPI: URI  This is a new problem. The current episode started in the past 7 days. The problem has been unchanged. There has been no fever. Associated symptoms include chest pain, congestion, coughing, rhinorrhea, sinus pain and a sore throat. Pertinent negatives include no headaches. She has tried acetaminophen  and increased fluids for the symptoms. The treatment provided mild relief.    Problems:  Patient Active Problem List   Diagnosis Date Noted   Vasomotor symptoms due to menopause 03/05/2023   Decreased GFR 03/05/2023   LGSIL on Pap smear of cervix with positive HRHPV on 11/23/2022 and 11/26/2023 01/09/2023   Perennial allergic rhinitis with seasonal variation 09/04/2022   Dyslipidemia 09/04/2022   Vitamin D  deficiency 09/04/2022   Migraine without aura and with status migrainosus, not intractable 09/04/2022   Insomnia, persistent 09/04/2022   Major depression, recurrent, chronic (HCC) 09/04/2022   Skin tags, multiple acquired 07/12/2022   ASCUS with positive high risk HPV cervical on 11/22/2021 11/28/2021   Status post laparoscopic supracervical hysterectomy for endometriosis 11/22/2021   Acne vulgaris  06/13/2021   Other hypertrophic disorders of the skin 06/13/2021   Dysplasia of cervix, low grade (CIN 1) 12/15/2020   Chronic rhinitis 08/05/2020   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HCC) 02/06/2020   Cervical dysplasia 11/24/2019   Hypertension due to endocrine disorder 12/10/2018   Pre-diabetes 09/27/2018   Prolapsed internal  hemorrhoids, grade 2 08/28/2018   History of endometriosis 04/10/2018   GAD (generalized anxiety disorder) 04/10/2018   Mild recurrent major depression (HCC) 04/10/2018   Obesity (BMI 30.0-34.9) 04/10/2018   ADHD (attention deficit hyperactivity disorder), 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   Palpitation 07/12/2017   Family history of malignant neoplasm of gastrointestinal tract    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   Allergic rhinitis 02/04/2016   Bilateral leg edema 04/09/2012   HTN (hypertension), benign     Allergies:  Allergies  Allergen Reactions   Ace Inhibitors Cough   Latex Itching    Gloves, condoms   Pollen Extract    Medications:  Current Outpatient Medications:    benzonatate  (TESSALON ) 200 MG capsule, Take 1 capsule (200 mg total) by mouth 2 (two) times daily as needed for cough., Disp: 20 capsule, Rfl: 0   fluticasone  (FLONASE ) 50 MCG/ACT nasal spray, Place 2 sprays into both nostrils daily., Disp: 16 g, Rfl: 6   promethazine -dextromethorphan (PROMETHAZINE -DM) 6.25-15 MG/5ML syrup, Take 5 mLs by mouth 3 (three) times daily as needed for cough., Disp: 118 mL, Rfl: 0   Boric Acid CRYS, Place 600 mg vaginally at bedtime. Use vaginally every night for two weeks then twice a week for six months, Disp: 500 g, Rfl: 5   estradiol  (CLIMARA ) 0.05 mg/24hr patch, Place 1 patch (0.05 mg total) onto the skin once a week., Disp: 4 patch, Rfl: 12   furosemide  (LASIX ) 20 MG tablet, Take 1 tablet (20 mg total) by mouth daily as needed., Disp: 30 tablet, Rfl: 0   lisdexamfetamine (VYVANSE ) 20 MG capsule, Take 1 capsule (20 mg total) by mouth daily., Disp: 30 capsule, Rfl: 0   lisdexamfetamine (VYVANSE ) 20 MG capsule, Take 1 capsule (20 mg total) by mouth daily., Disp: 30 capsule, Rfl: 0   lisdexamfetamine (VYVANSE ) 20 MG capsule, Take 1 capsule (20 mg total) by mouth daily., Disp: 30 capsule, Rfl: 0   metoprolol  succinate (TOPROL -XL) 50 MG 24 hr tablet, Take with or immediately following a meal., Disp:  90 tablet, Rfl: 1   naproxen  (NAPROSYN ) 500 MG tablet, Take 1 tablet (500 mg total) by mouth 2 (two) times daily as needed., Disp: 30 tablet, Rfl: 2   olmesartan -hydrochlorothiazide  (BENICAR  HCT) 20-12.5 MG tablet, Take 1 tablet by mouth daily., Disp: 90 tablet, Rfl: 1   potassium chloride  SA (KLOR-CON  M) 20 MEQ tablet, Take 1 tablet (20 mEq total) by mouth daily. When you take furosemide , Disp: 30 tablet, Rfl: 0   topiramate  (TOPAMAX ) 100 MG tablet, Take 1 tablet (100 mg total) by mouth every evening., Disp: 90 tablet, Rfl: 1   Ubrogepant  (UBRELVY ) 100 MG TABS, Take 1 tablet (100 mg total) by mouth every other day., Disp: 16 tablet, Rfl: 2   venlafaxine  XR (EFFEXOR  XR) 150 MG 24 hr capsule, Take 1 capsule (150 mg total) by mouth daily with breakfast., Disp: 90 capsule, Rfl: 1  Observations/Objective: Patient is well-developed, well-nourished in no acute distress.  Resting comfortably  at home.  Head is normocephalic, atraumatic.  No labored breathing.  Speech is clear and coherent with logical content.  Patient is alert and oriented at baseline.  Nasal congestion   Assessment and Plan:  1. Viral URI with cough (Primary) - fluticasone  (FLONASE ) 50 MCG/ACT nasal spray; Place 2 sprays into both nostrils daily.  Dispense: 16 g; Refill: 6 - benzonatate  (TESSALON ) 200 MG capsule; Take 1 capsule (200 mg total) by mouth 2 (two) times daily as needed for cough.  Dispense: 20 capsule; Refill: 0 - promethazine -dextromethorphan (PROMETHAZINE -DM) 6.25-15 MG/5ML syrup; Take 5 mLs by mouth 3 (three) times daily as needed for cough.  Dispense: 118 mL; Refill: 0  - Take meds as prescribed - Use a cool mist humidifier  -Use saline nose sprays frequently -Force fluids -For any cough or congestion  Use plain Mucinex- regular strength or max strength is fine -For fever or aces or pains- take tylenol  or ibuprofen. -Throat lozenges if help -Follow up if symptoms worsen or do not improve   Follow Up  Instructions: I discussed the assessment and treatment plan with the patient. The patient was provided an opportunity to ask questions and all were answered. The patient agreed with the plan and demonstrated an understanding of the instructions.  A copy of instructions were sent to the patient via MyChart unless otherwise noted below.     The patient was advised to call back or seek an in-person evaluation if the symptoms worsen or if the condition fails to improve as anticipated.    Tommas Fragmin, FNP

## 2024-05-31 NOTE — Patient Instructions (Signed)

## 2024-06-06 ENCOUNTER — Ambulatory Visit: Admitting: Family Medicine

## 2024-06-10 ENCOUNTER — Encounter: Payer: Self-pay | Admitting: Family Medicine

## 2024-06-10 ENCOUNTER — Other Ambulatory Visit: Payer: Self-pay | Admitting: Family Medicine

## 2024-06-10 DIAGNOSIS — G5600 Carpal tunnel syndrome, unspecified upper limb: Secondary | ICD-10-CM

## 2024-07-10 ENCOUNTER — Other Ambulatory Visit: Payer: Self-pay | Admitting: Family Medicine

## 2024-07-10 ENCOUNTER — Other Ambulatory Visit: Payer: Self-pay

## 2024-07-10 DIAGNOSIS — F900 Attention-deficit hyperactivity disorder, predominantly inattentive type: Secondary | ICD-10-CM

## 2024-07-10 DIAGNOSIS — F339 Major depressive disorder, recurrent, unspecified: Secondary | ICD-10-CM

## 2024-07-10 NOTE — Telephone Encounter (Signed)
 I can't refuse controlled has an appt mondaY

## 2024-07-11 ENCOUNTER — Other Ambulatory Visit: Payer: Self-pay

## 2024-07-11 NOTE — Telephone Encounter (Signed)
 Appt sch'd next week with Dr Glenard

## 2024-07-15 ENCOUNTER — Other Ambulatory Visit: Payer: Self-pay | Admitting: Family Medicine

## 2024-07-15 ENCOUNTER — Other Ambulatory Visit: Payer: Self-pay

## 2024-07-15 ENCOUNTER — Encounter: Payer: Self-pay | Admitting: Family Medicine

## 2024-07-15 ENCOUNTER — Telehealth: Payer: Self-pay | Admitting: Pharmacy Technician

## 2024-07-15 ENCOUNTER — Other Ambulatory Visit (HOSPITAL_COMMUNITY): Payer: Self-pay

## 2024-07-15 ENCOUNTER — Ambulatory Visit (INDEPENDENT_AMBULATORY_CARE_PROVIDER_SITE_OTHER): Admitting: Family Medicine

## 2024-07-15 VITALS — BP 126/78 | HR 67 | Resp 16 | Ht 66.0 in | Wt 190.0 lb

## 2024-07-15 DIAGNOSIS — F900 Attention-deficit hyperactivity disorder, predominantly inattentive type: Secondary | ICD-10-CM | POA: Diagnosis not present

## 2024-07-15 DIAGNOSIS — F339 Major depressive disorder, recurrent, unspecified: Secondary | ICD-10-CM

## 2024-07-15 DIAGNOSIS — G5600 Carpal tunnel syndrome, unspecified upper limb: Secondary | ICD-10-CM | POA: Diagnosis not present

## 2024-07-15 DIAGNOSIS — M2550 Pain in unspecified joint: Secondary | ICD-10-CM

## 2024-07-15 DIAGNOSIS — I10 Essential (primary) hypertension: Secondary | ICD-10-CM | POA: Diagnosis not present

## 2024-07-15 DIAGNOSIS — R7303 Prediabetes: Secondary | ICD-10-CM | POA: Diagnosis not present

## 2024-07-15 DIAGNOSIS — N951 Menopausal and female climacteric states: Secondary | ICD-10-CM | POA: Diagnosis not present

## 2024-07-15 DIAGNOSIS — R002 Palpitations: Secondary | ICD-10-CM

## 2024-07-15 DIAGNOSIS — E559 Vitamin D deficiency, unspecified: Secondary | ICD-10-CM

## 2024-07-15 DIAGNOSIS — G43009 Migraine without aura, not intractable, without status migrainosus: Secondary | ICD-10-CM | POA: Diagnosis not present

## 2024-07-15 MED ORDER — QUETIAPINE FUMARATE 25 MG PO TABS: 25.0000 mg | ORAL_TABLET | Freq: Every day | ORAL | 0 refills | Status: DC

## 2024-07-15 MED ORDER — LISDEXAMFETAMINE DIMESYLATE 20 MG PO CAPS: 20.0000 mg | ORAL_CAPSULE | Freq: Every day | ORAL | 0 refills | Status: DC

## 2024-07-15 MED ORDER — NURTEC 75 MG PO TBDP: 1.0000 | ORAL_TABLET | Freq: Every day | ORAL | 2 refills | Status: DC | PRN

## 2024-07-15 MED ORDER — OLMESARTAN MEDOXOMIL-HCTZ 20-12.5 MG PO TABS: 1.0000 | ORAL_TABLET | Freq: Every day | ORAL | 1 refills | Status: DC

## 2024-07-15 MED ORDER — METOPROLOL SUCCINATE ER 50 MG PO TB24: ORAL_TABLET | ORAL | 1 refills | Status: DC

## 2024-07-15 MED ORDER — VENLAFAXINE HCL ER 150 MG PO CP24: 150.0000 mg | ORAL_CAPSULE | Freq: Every day | ORAL | 1 refills | Status: DC

## 2024-07-15 MED ORDER — TOPIRAMATE 100 MG PO TABS: 100.0000 mg | ORAL_TABLET | Freq: Every evening | ORAL | 1 refills | Status: DC

## 2024-07-15 NOTE — Telephone Encounter (Signed)
 Ms. Brandi can fill her nurtec prescription because it was approved partially, meaning she can only fill 16 tablets every 30 days. PA is good until 07/15/2025.

## 2024-07-15 NOTE — Telephone Encounter (Signed)
 Pharmacy Patient Advocate Encounter  Received notification from [facility] that Prior Authorization for Nurtec 75MG  dispersible tablets has been DENIED.  Full denial letter will be uploaded to the media tab. See denial reason below.     PA #/Case ID/Reference #: [id number]

## 2024-07-15 NOTE — Telephone Encounter (Signed)
 Pharmacy Patient Advocate Encounter   Received notification from CoverMyMeds that prior authorization for Nurtec 75MG  dispersible tablets is required/requested.   Insurance verification completed.   The patient is insured through [facility] .   Per test claim: PA required; PA submitted to above mentioned insurance via CoverMyMeds Key/confirmation #/EOC [id number] Status is pending

## 2024-07-15 NOTE — Progress Notes (Signed)
 Name: Amanda Fields   MRN: [id number]    DOB: 27-Apr-1976   Date:07/15/2024       Progress Note  Subjective  Chief Complaint  Chief Complaint  Patient presents with   Medication Refill   Discussed the use of AI scribe software for clinical note transcription with the patient, who gave verbal consent to proceed.  History of Present Illness Amanda Fields is a 48 year old female with moderate to severe carpal tunnel syndrome who presents for follow-up and management of her condition.  She experiences burning and tingling pain in both hands, diagnosed as moderate to severe carpal tunnel syndrome following nerve conduction studies. Gabapentin  was prescribed but was ineffective and caused drowsiness. She works at the [employer].  She has a history of hypertension, with a previous reading of 144/86. She is currently taking olmesartan  HCTZ 20/12.5 mg and metoprolol  XL 50 mg daily. Although she has not checked her blood pressure at home, readings have been normal at the hospital while visiting her mother.  Her mother, aged 50, has been diagnosed with congestive heart failure, type 2 diabetes, COPD, and emphysema, contributing to her stress and elevated depression score, which increased from 15 to 18 on the PHQ-9 scale. She takes venlafaxine  150 mg daily for hot flashes and depression but finds it ineffective for depression. She experiences difficulty sleeping and increased stress.  She has a history of prediabetes and manages her condition through diet and exercise, including walking and eating a balanced meal once a day.   She takes Vyvanse  20 mg daily for ADHD, which also helps curb her appetite.  She experiences migraines without aura, triggered by stress, fresh cut pineapple, and white wine. She takes Topamax  nightly and uses Ubrelvy  for acute episodes but is interested in a medication that dissolves in the mouth due to nausea and vomiting during migraines.  She reports polyarthralgia with  pain extending to her knees. Previous tests showed slightly elevated inflammatory markers, but lupus panel, B12, and thyroid  tests were normal. She suspects arthritis as a possible cause of her symptoms.  She has a history of vitamin D  deficiency and takes over-the-counter vitamin D  supplements.   She also has a history of using Lasix  and potassium for swelling of her hand, which she attributes to her carpal tunnel syndrome. She takes it prn     Patient Active Problem List   Diagnosis Date Noted   Vasomotor symptoms due to menopause 03/05/2023   Decreased GFR 03/05/2023   LGSIL on Pap smear of cervix with positive HRHPV on 11/23/2022 and 11/26/2023 01/09/2023   Perennial allergic rhinitis with seasonal variation 09/04/2022   Dyslipidemia 09/04/2022   Vitamin D  deficiency 09/04/2022   Migraine without aura and with status migrainosus, not intractable 09/04/2022   Insomnia, persistent 09/04/2022   Major depression, recurrent, chronic (HCC) 09/04/2022   Skin tags, multiple acquired 07/12/2022   ASCUS with positive high risk HPV cervical on 11/22/2021 11/28/2021   Status post laparoscopic supracervical hysterectomy for endometriosis 11/22/2021   Acne vulgaris 06/13/2021   Other hypertrophic disorders of the skin 06/13/2021   Dysplasia of cervix, low grade (CIN 1) 12/15/2020   Chronic rhinitis 08/05/2020   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HCC) 02/06/2020   Cervical dysplasia 11/24/2019   Hypertension due to endocrine disorder 12/10/2018   Pre-diabetes 09/27/2018   Prolapsed internal hemorrhoids, grade 2 08/28/2018   History of endometriosis 04/10/2018   GAD (generalized anxiety disorder) 04/10/2018   Mild recurrent major depression (HCC) 04/10/2018  Obesity (BMI 30.0-34.9) 04/10/2018   ADHD (attention deficit hyperactivity disorder), 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   Palpitation 07/12/2017   Family history of malignant neoplasm of gastrointestinal tract    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   Allergic  rhinitis 02/04/2016   Bilateral leg edema 04/09/2012   HTN (hypertension), benign     Past Surgical History:  Procedure Laterality Date   COLONOSCOPY WITH PROPOFOL  N/A 04/09/2017   Procedure: COLONOSCOPY WITH PROPOFOL ;  Surgeon: Amanda Copping, MD;  Location: [facility];  Service: Endoscopy;  Laterality: N/A;  LATEX sensitivity   IR GENERIC HISTORICAL  07/07/2016   IR VENOGRAM RENAL UNI LEFT 07/07/2016 Amanda Moan, MD Amanda Fields   IR GENERIC HISTORICAL  07/07/2016   IR VENOUS SAMPLING 07/07/2016 Amanda Moan, MD Amanda Fields   IR GENERIC HISTORICAL  07/07/2016   IR VENOGRAM ADRENAL BI 07/07/2016 Amanda Moan, MD Amanda Fields   IR GENERIC HISTORICAL  07/07/2016   IR US  GUIDE VASC ACCESS RIGHT 07/07/2016 Amanda Moan, MD Amanda Fields   IR GENERIC HISTORICAL  07/07/2016   IR ANGIOGRAM SELECTIVE EACH ADDITIONAL VESSEL 07/07/2016 Amanda Moan, MD Amanda Fields   IR GENERIC HISTORICAL  07/07/2016   IR VENOUS SAMPLING 07/07/2016 Amanda Moan, MD Amanda Fields   IR GENERIC HISTORICAL  07/07/2016   IR US  GUIDE VASC ACCESS RIGHT 07/07/2016 Amanda Moan, MD Amanda Fields   LAPAROSCOPIC SUPRACERVICAL HYSTERECTOMY  09/27/2010   For endometriosis. Ovaries and tubes are in place.   LEEP  03/07/2022   NASAL SEPTUM SURGERY     ROBOTIC ADRENALECTOMY Left 10/27/2016   Procedure: XI ROBOTIC LEFT ADRENALECTOMY;  Surgeon: Amanda Schultze, MD;  Location: [facility];  Service: General;  Laterality: Left;   TONSILLECTOMY     TUBAL LIGATION  2006   WISDOM TOOTH EXTRACTION      Family History  Problem Relation Age of Onset   Hypertension Mother    Heart disease Mother    Diabetes Mother    COPD Mother    Hypotension Mother    Colon cancer Father    Emphysema Father    Cancer Father        colon   Hyperlipidemia Sister    Ovarian cancer Maternal Grandmother    Breast cancer Maternal Aunt        x2 times ? age of dx   Heart failure Paternal Uncle    Heart attack Cousin 32   Heart  disease Cousin        heart attack   Adrenal disorder Neg Hx     Social History   Tobacco Use   Smoking status: Never    Passive exposure: Never   Smokeless tobacco: Never  Substance Use Topics   Alcohol use: Yes    Comment: social (1 drink/mo)     Current Outpatient Medications:    Boric Acid CRYS, Place 600 mg vaginally at bedtime. Use vaginally every night for two weeks then twice a week for six months, Disp: 500 g, Rfl: 5   estradiol  (CLIMARA ) 0.05 mg/24hr patch, Place 1 patch (0.05 mg total) onto the skin once a week., Disp: 4 patch, Rfl: 12   furosemide  (LASIX ) 20 MG tablet, Take 1 tablet (20 mg total) by mouth daily as needed., Disp: 30 tablet, Rfl: 0   potassium chloride  SA (KLOR-CON  M) 20 MEQ tablet, Take 1 tablet (20 mEq total) by mouth daily. When you take furosemide , Disp: 30 tablet, Rfl: 0   QUEtiapine  (SEROQUEL ) 25 MG tablet, Take 1 tablet (25 mg total)  by mouth at bedtime., Disp: 30 tablet, Rfl: 0   Rimegepant Sulfate (NURTEC) 75 MG TBDP, Take 1 tablet (75 mg total) by mouth daily as needed., Disp: 16 tablet, Rfl: 2   Ubrogepant  (UBRELVY ) 100 MG TABS, Take 1 tablet (100 mg total) by mouth every other day., Disp: 16 tablet, Rfl: 2   lisdexamfetamine (VYVANSE ) 20 MG capsule, Take 1 capsule (20 mg total) by mouth daily., Disp: 30 capsule, Rfl: 0   lisdexamfetamine (VYVANSE ) 20 MG capsule, Take 1 capsule (20 mg total) by mouth daily., Disp: 30 capsule, Rfl: 0   lisdexamfetamine (VYVANSE ) 20 MG capsule, Take 1 capsule (20 mg total) by mouth daily., Disp: 30 capsule, Rfl: 0   metoprolol  succinate (TOPROL -XL) 50 MG 24 hr tablet, Take with or immediately following a meal., Disp: 90 tablet, Rfl: 1   olmesartan -hydrochlorothiazide  (BENICAR  HCT) 20-12.5 MG tablet, Take 1 tablet by mouth daily., Disp: 90 tablet, Rfl: 1   topiramate  (TOPAMAX ) 100 MG tablet, Take 1 tablet (100 mg total) by mouth every evening., Disp: 90 tablet, Rfl: 1   venlafaxine  XR (EFFEXOR  XR) 150 MG 24 hr  capsule, Take 1 capsule (150 mg total) by mouth daily with breakfast., Disp: 90 capsule, Rfl: 1  Allergies  Allergen Reactions   Ace Inhibitors Cough   Latex Itching    Gloves, condoms   Pollen Extract     I personally reviewed active problem list, medication list, allergies, family history with the patient/caregiver today.   ROS  Ten systems reviewed and is negative except as mentioned in HPI    Objective Physical Exam  CONSTITUTIONAL: Patient appears well-developed and well-nourished.  No distress. HEENT: Head atraumatic, normocephalic, neck supple. CARDIOVASCULAR: Normal rate, regular rhythm and normal heart sounds.  No murmur heard. No BLE edema. PULMONARY: Effort normal and breath sounds normal. No respiratory distress. ABDOMINAL: There is no tenderness or distention. MUSCULOSKELETAL: Normal gait. Without gross motor or sensory deficit. PSYCHIATRIC: Patient has a normal mood and affect. behavior is normal. Judgment and thought content normal.  Vitals:   07/15/24 0958  BP: 126/78  Pulse: 67  Resp: 16  SpO2: 100%  Weight: 190 lb (86.2 kg)  Height: 5' 6 (1.676 m)    Body mass index is 30.67 kg/m.  No results found for this or any previous visit (from the past 2160 hours).  Diabetic Foot Exam:     PHQ2/9:    07/15/2024   10:01 AM 03/20/2024    8:20 AM 01/08/2024    4:14 PM 11/26/2023    3:39 PM 10/08/2023    1:54 PM  Depression screen PHQ 2/9  Decreased Interest 3 3 0 2 1  Down, Depressed, Hopeless 3 3 0 2 1  PHQ - 2 Score 6 6 0 4 2  Altered sleeping 3 3 0 3 0  Tired, decreased energy 3 3 2 2 3   Change in appetite 3 0 0 1 3  Feeling bad or failure about yourself  3 0 0 1 0  Trouble concentrating 0 3 1 1 3   Moving slowly or fidgety/restless 0 0 0 2 0  Suicidal thoughts 0 0 0 0 0  PHQ-9 Score 18 15 3 14 11   Difficult doing work/chores  Somewhat difficult  Not difficult at all     phq 9 is positive  Fall Risk:    01/08/2024    4:14 PM 10/08/2023     1:54 PM 07/06/2023    1:29 PM 05/07/2023    1:28 PM 04/05/2023  11:22 AM  Fall Risk   Falls in the past year? 0 0 0 0 0  Number falls in past yr: 0 0 0  0  Injury with Fall? 0 0 0  0  Risk for fall due to : No Fall Risks No Fall Risks No Fall Risks No Fall Risks No Fall Risks  Follow up Falls prevention discussed Falls prevention discussed Falls prevention discussed Falls prevention discussed Falls prevention discussed      Assessment & Plan Bilateral carpal tunnel syndrome Moderate to severe confirmed by nerve conduction studies. Gabapentin  ineffective and sedating. - Proceed with scheduled surgery for the right hand. - Plan for left hand surgery after recovery from the right hand surgery.  Major depressive disorder with insomnia PHQ-9 score increased from 15 to 18. Venlafaxine  ineffective for depression. Insomnia exacerbates stress and fatigue. - Prescribe quetiapine  25 mg for sleep and potential augmentation of antidepressant effects. - Continue venlafaxine  150 mg daily. - Consider trazodone  if quetiapine  is ineffective.  Migraine without aura Migraines triggered by stress, environmental factors, and certain foods. Interest in orally dissolving medication due to nausea. - Switch from Ubrelvy  to Nurtec ODT for acute migraine treatment. - Continue Topamax  nightly.  Essential hypertension Blood pressure 144/86 mmHg, likely stress-related. Current medications well-tolerated. - Continue olmesartan  HCTZ 20/12.5 mg daily. - Continue metoprolol  50 mg daily.  Prediabetes Previously elevated blood sugar levels. Weight decreased. Diet includes fruits, water , and balanced evening meal. - Continue current diet and exercise regimen.  Obesity Weight decreased from morbidly obese to barely obese. Current lifestyle includes walking and balanced diet. - Continue current diet and exercise regimen.  Attention-deficit hyperactivity disorder Symptoms stable on Vyvanse  20 mg daily. No dose  increase due to potential side effects. - Continue Vyvanse  20 mg daily. - Provide three separate prescriptions for Vyvanse .  Polyarthralgia, possible osteoarthritis Joint pain with slightly elevated inflammatory markers, negative lupus panel. Possible osteoarthritis due to age. - Recheck C-reactive protein and other inflammatory markers. - Refer to orthopedic specialist for knee x-rays.  Vitamin D  deficiency Managed with over-the-counter supplementation. - Continue over-the-counter vitamin D  2000 IU daily.

## 2024-07-25 HISTORY — PX: CARPAL TUNNEL RELEASE: SHX101

## 2024-08-11 ENCOUNTER — Ambulatory Visit: Admitting: Internal Medicine

## 2024-09-04 ENCOUNTER — Ambulatory Visit: Payer: Self-pay

## 2024-09-05 ENCOUNTER — Ambulatory Visit
Admission: RE | Admit: 2024-09-05 | Discharge: 2024-09-05 | Disposition: A | Discharge status: Home / Self Care | Payer: Self-pay | Attending: Family Medicine | Admitting: Family Medicine

## 2024-09-05 VITALS — BP 150/106 | HR 78 | Temp 98.8°F | Resp 14 | Ht 66.0 in | Wt 190.0 lb

## 2024-09-05 DIAGNOSIS — J329 Chronic sinusitis, unspecified: Secondary | ICD-10-CM

## 2024-09-05 DIAGNOSIS — I10 Essential (primary) hypertension: Secondary | ICD-10-CM | POA: Diagnosis not present

## 2024-09-05 DIAGNOSIS — J40 Bronchitis, not specified as acute or chronic: Secondary | ICD-10-CM

## 2024-09-05 MED ORDER — PREDNISONE 50 MG PO TABS: 50.0000 mg | ORAL_TABLET | Freq: Every day | ORAL | 0 refills | Status: AC

## 2024-09-05 MED ORDER — AZITHROMYCIN 250 MG PO TABS: ORAL_TABLET | ORAL | 0 refills | Status: DC

## 2024-09-05 MED ORDER — FLUCONAZOLE 150 MG PO TABS: 150.0000 mg | ORAL_TABLET | Freq: Once | ORAL | 0 refills | Status: AC

## 2024-09-05 MED ORDER — ALBUTEROL SULFATE HFA 108 (90 BASE) MCG/ACT IN AERS: 2.0000 | INHALATION_SPRAY | RESPIRATORY_TRACT | 0 refills | Status: DC | PRN

## 2024-09-05 NOTE — ED Triage Notes (Signed)
 Patient c/o sinus congestion and pressure, cough, and congestion that started a week ago.  Patient denies fevers.  Patient also reports ongoing pain in her left knee also.

## 2024-09-05 NOTE — Discharge Instructions (Signed)
 Stop by the pharmacy to pick up your prescriptions.  Follow up with your primary care provider or return to the urgent care, if not improving.

## 2024-09-05 NOTE — ED Provider Notes (Signed)
 [facility]    CSN: [id number] Arrival date & time: 09/05/24  1659      History   Chief Complaint Chief Complaint  Patient presents with   Sinus Problem   Cough    HPI Amanda Fields is a 48 y.o. female.   HPI  History obtained from the patient. Amanda Fields presents for sinus congestion with sinus pressure, cough, headache, dental pain, and body aches. Feels chest tightness with cough get winded with walking.  Symptoms started 3 weeks ago.  She had a headache but it resolved. Took Excedrin and sinus medication without relief.  No fever, vomiting, diarrhea and abdominal pain.   No known sick contacts.   No history of smoking. No history asthma.    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 medically.   Cough due to ACE inhibitor    Dizziness    Edema leg    Elevated troponin I level 12/31/2015   Endometriosis    Hypertension    a. echo 03/2012 EF 60-65%, moderate LVH, nl LV diastolic fxn, nl PASP; b. 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 03/05/2023   Decreased GFR 03/05/2023   LGSIL on Pap smear of cervix with positive HRHPV on 11/23/2022 and 11/26/2023 01/09/2023   Perennial allergic rhinitis with seasonal variation 09/04/2022   Dyslipidemia 09/04/2022   Vitamin D  deficiency 09/04/2022   Migraine without aura and with status migrainosus, not intractable 09/04/2022   Insomnia, persistent 09/04/2022   Major depression, recurrent, chronic (HCC)  09/04/2022   Skin tags, multiple acquired 07/12/2022   ASCUS with positive high risk HPV cervical on 11/22/2021 11/28/2021   Status post laparoscopic supracervical hysterectomy for endometriosis 11/22/2021   Acne vulgaris 06/13/2021   Other hypertrophic disorders of the skin 06/13/2021   Dysplasia of cervix, low grade (CIN 1) 12/15/2020   Chronic rhinitis 08/05/2020   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HCC) 02/06/2020   Cervical dysplasia 11/24/2019   Hypertension due to endocrine disorder 12/10/2018   Pre-diabetes 09/27/2018   Prolapsed internal hemorrhoids, grade 2 08/28/2018   History of endometriosis 04/10/2018   GAD (generalized anxiety disorder) 04/10/2018   Mild recurrent major depression (HCC) 04/10/2018   Obesity (BMI 30.0-34.9) 04/10/2018   ADHD (attention deficit hyperactivity disorder), 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   Palpitation 07/12/2017   Family history of malignant neoplasm of gastrointestinal tract    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   Allergic rhinitis 02/04/2016   Bilateral leg edema 04/09/2012   HTN (hypertension), benign     Past Surgical History:  Procedure Laterality Date   COLONOSCOPY WITH PROPOFOL  N/A 04/09/2017   Procedure: COLONOSCOPY WITH PROPOFOL ;  Surgeon: Rogelia Copping, MD;  Location: [facility];  Service: Endoscopy;  Laterality: N/A;  LATEX sensitivity   IR GENERIC HISTORICAL  07/07/2016   IR VENOGRAM RENAL UNI LEFT 07/07/2016 Marcey Moan, MD [facility]   IR GENERIC HISTORICAL  07/07/2016   IR VENOUS SAMPLING 07/07/2016 Marcey Moan, MD  [facility]   IR GENERIC HISTORICAL  07/07/2016   IR VENOGRAM ADRENAL BI 07/07/2016 Marcey Moan, MD [facility]   IR GENERIC HISTORICAL  07/07/2016   IR US  GUIDE VASC ACCESS RIGHT 07/07/2016 Marcey Moan, MD [facility]   IR GENERIC HISTORICAL  07/07/2016   IR ANGIOGRAM SELECTIVE EACH ADDITIONAL VESSEL 07/07/2016 Marcey Moan, MD [facility]   IR GENERIC HISTORICAL  07/07/2016   IR VENOUS SAMPLING  07/07/2016 Marcey Moan, MD [facility]   IR GENERIC HISTORICAL  07/07/2016   IR US  GUIDE VASC ACCESS RIGHT 07/07/2016 Marcey Moan, MD [facility]   LAPAROSCOPIC SUPRACERVICAL HYSTERECTOMY  09/27/2010   For endometriosis. Ovaries and tubes are in place.   LEEP  03/07/2022   NASAL SEPTUM SURGERY     ROBOTIC ADRENALECTOMY Left 10/27/2016   Procedure: XI ROBOTIC LEFT ADRENALECTOMY;  Surgeon: Elspeth Schultze, MD;  Location: [facility];  Service: General;  Laterality: Left;   TONSILLECTOMY     TUBAL LIGATION  2006   WISDOM TOOTH EXTRACTION      OB History     Gravida  2   Para  2   Term  2   Preterm      AB      Living  2      SAB      IAB      Ectopic      Multiple      Live Births  2            Home Medications    Prior to Admission medications   Medication Sig Start Date End Date Taking? Authorizing Provider  albuterol  (VENTOLIN  HFA) 108 (90 Base) MCG/ACT inhaler Inhale 2 puffs into the lungs every 4 (four) hours as needed. 09/05/24  Yes Norval Slaven, DO  azithromycin  (ZITHROMAX  Z-PAK) 250 MG tablet Take 2 tablets on day 1 then 1 tablet daily 09/05/24  Yes Laderrick Wilk, DO  fluconazole  (DIFLUCAN ) 150 MG tablet Take 1 tablet (150 mg total) by mouth once for 1 dose. 09/05/24 09/05/24 Yes Juliannah Ohmann, DO  predniSONE  (DELTASONE ) 50 MG tablet Take 1 tablet (50 mg total) by mouth daily for 5 days. 09/05/24 09/10/24 Yes Ruie Sendejo, DO  Boric Acid CRYS Place 600 mg vaginally at bedtime. Use vaginally every night for two weeks then twice a week for six months 05/01/23   Anyanwu, Gloris LABOR, MD  estradiol  (CLIMARA ) 0.05 mg/24hr patch Place 1 patch (0.05 mg total) onto the skin once a week. 09/11/23   Anyanwu, Ugonna A, MD  furosemide  (LASIX ) 20 MG tablet Take 1 tablet (20 mg total) by mouth daily as needed. 03/20/24   Sowles, Krichna, MD  lisdexamfetamine (VYVANSE ) 20 MG capsule Take 1 capsule (20 mg total) by mouth daily. 07/15/24   Sowles, Krichna, MD   lisdexamfetamine (VYVANSE ) 20 MG capsule Take 1 capsule (20 mg total) by mouth daily. 07/15/24   Sowles, Krichna, MD  lisdexamfetamine (VYVANSE ) 20 MG capsule Take 1 capsule (20 mg total) by mouth daily. 07/15/24   Sowles, Krichna, MD  metoprolol  succinate (TOPROL -XL) 50 MG 24 hr tablet Take with or immediately following a meal. 07/15/24   Sowles, Krichna, MD  olmesartan -hydrochlorothiazide  (BENICAR  HCT) 20-12.5 MG tablet Take 1 tablet by mouth daily. 07/15/24   Sowles, Krichna, MD  potassium chloride  SA (KLOR-CON  M) 20 MEQ tablet Take 1 tablet (20 mEq total) by mouth daily. When you take furosemide  03/20/24   Sowles, Krichna, MD  QUEtiapine  (SEROQUEL ) 25 MG tablet Take 1 tablet (  25 mg total) by mouth at bedtime. 07/15/24   Sowles, Krichna, MD  Rimegepant Sulfate (NURTEC) 75 MG TBDP Take 1 tablet (75 mg total) by mouth daily as needed. 07/15/24   Sowles, Krichna, MD  topiramate  (TOPAMAX ) 100 MG tablet Take 1 tablet (100 mg total) by mouth every evening. 07/15/24   Sowles, Krichna, MD  venlafaxine  XR (EFFEXOR  XR) 150 MG 24 hr capsule Take 1 capsule (150 mg total) by mouth daily with breakfast. 07/15/24   Glenard Mire, MD    Family History Family History  Problem Relation Age of Onset   Hypertension Mother    Heart disease Mother    Diabetes Mother    COPD Mother    Hypotension Mother    Colon cancer Father    Emphysema Father    Cancer Father        colon   Hyperlipidemia Sister    Ovarian cancer Maternal Grandmother    Breast cancer Maternal Aunt        x2 times ? age of dx   Heart failure Paternal Uncle    Heart attack Cousin 32   Heart disease Cousin        heart attack   Adrenal disorder Neg Hx     Social History Social History   Tobacco Use   Smoking status: Never    Passive exposure: Never   Smokeless tobacco: Never  Vaping Use   Vaping status: Never Used  Substance Use Topics   Alcohol use: Yes    Comment: social (1 drink/mo)   Drug use: No     Allergies   Ace  inhibitors, Latex, and Pollen extract   Review of Systems Review of Systems: negative unless otherwise stated in HPI.      Physical Exam Triage Vital Signs ED Triage Vitals  Encounter Vitals Group     BP 09/05/24 1720 (!) 147/106     Girls Systolic BP Percentile --      Girls Diastolic BP Percentile --      Boys Systolic BP Percentile --      Boys Diastolic BP Percentile --      Pulse Rate 09/05/24 1720 78     Resp 09/05/24 1720 14     Temp 09/05/24 1720 98.8 F (37.1 C)     Temp Source 09/05/24 1720 Oral     SpO2 09/05/24 1720 96 %     Weight 09/05/24 1719 190 lb 0.6 oz (86.2 kg)     Height 09/05/24 1719 5' 6 (1.676 m)     Head Circumference --      Peak Flow --      Pain Score 09/05/24 1719 6     Pain Loc --      Pain Education --      Exclude from Growth Chart --    No data found.  Updated Vital Signs BP (!) 150/106   Pulse 78   Temp 98.8 F (37.1 C) (Oral)   Resp 14   Ht 5' 6 (1.676 m)   Wt 86.2 kg   SpO2 96%   BMI 30.67 kg/m   Visual Acuity Right Eye Distance:   Left Eye Distance:   Bilateral Distance:    Right Eye Near:   Left Eye Near:    Bilateral Near:     Physical Exam GEN:     alert, non-toxic appearing female in no distress    HENT:  mucus membranes moist,  moderate erythematous edematous turbinates, clear nasal discharge,  bilateral TM normal, no frontal sinus tenderness, bilateral maxillary sinus tenderness EYES:   pupils equal and reactive, no scleral injection or discharge NECK:  normal ROM, no meningismus   RESP:  no increased work of breathing, clear to auscultation bilaterally CVS:   regular rate and rhythm Skin:   warm and dry, no rash on visible skin    UC Treatments / Results  Labs (all labs ordered are listed, but only abnormal results are displayed) Labs Reviewed - No data to display  EKG   Radiology No results found.   Procedures Procedures (including critical care time)  Medications Ordered in UC Medications  - No data to display  Initial Impression / Assessment and Plan / UC Course  I have reviewed the triage vital signs and the nursing notes.  Pertinent labs & imaging results that were available during my care of the patient were reviewed by me and considered in my medical decision making (see chart for details).       Pt is a 48 y.o. female who presents for 3 weeks of cough that is not improving.  Amanda Fields is  afebrile here without recent antipyretics. Satting well  on room air. Overall pt is  non-toxic appearing, well hydrated, without respiratory distress. Pulmonary exam is unremarkable except for cough.  After shared decision making, we will not pursue chest x-ray at this time. She has moderate maxillary sinus tenderness bilaterally.  COVID  and influenza testing deferred due to length of symptoms.   Treat acute sinobronchitis with steroids and antibiotics as below. Diflucan  for antibiotic associated yeast infection prevention. Albuterol  inhaler for bronchospasm..  Typical duration of symptoms discussed. Return and ED precautions given and patient voiced understanding.   Discussed MDM, treatment plan and plan for follow-up with patient who agrees with plan.     Final Clinical Impressions(s) / UC Diagnoses   Final diagnoses:  Sinobronchitis  Elevated blood pressure reading with diagnosis of hypertension     Discharge Instructions      Stop by the pharmacy to pick up your prescriptions.  Follow up with your primary care provider or return to the urgent care, if not improving.        ED Prescriptions     Medication Sig Dispense Auth. Provider   azithromycin  (ZITHROMAX  Z-PAK) 250 MG tablet Take 2 tablets on day 1 then 1 tablet daily 6 tablet Lorry Anastasi, DO   predniSONE  (DELTASONE ) 50 MG tablet Take 1 tablet (50 mg total) by mouth daily for 5 days. 5 tablet Jozi Malachi, DO   fluconazole  (DIFLUCAN ) 150 MG tablet Take 1 tablet (150 mg total) by mouth once for 1 dose. 1  tablet Izen Petz, DO   albuterol  (VENTOLIN  HFA) 108 (90 Base) MCG/ACT inhaler Inhale 2 puffs into the lungs every 4 (four) hours as needed. 6.7 g Aivah Putman, DO      PDMP not reviewed this encounter.   Matheau Orona, DO 09/05/24 1736

## 2024-10-16 ENCOUNTER — Telehealth: Admitting: Family Medicine

## 2024-10-16 ENCOUNTER — Ambulatory Visit: Admitting: Family Medicine

## 2024-10-16 ENCOUNTER — Encounter: Payer: Self-pay | Admitting: Family Medicine

## 2024-10-16 ENCOUNTER — Other Ambulatory Visit: Payer: Self-pay

## 2024-10-16 DIAGNOSIS — F900 Attention-deficit hyperactivity disorder, predominantly inattentive type: Secondary | ICD-10-CM

## 2024-10-16 DIAGNOSIS — R7303 Prediabetes: Secondary | ICD-10-CM

## 2024-10-16 DIAGNOSIS — F330 Major depressive disorder, recurrent, mild: Secondary | ICD-10-CM

## 2024-10-16 DIAGNOSIS — I10 Essential (primary) hypertension: Secondary | ICD-10-CM | POA: Diagnosis not present

## 2024-10-16 DIAGNOSIS — G43009 Migraine without aura, not intractable, without status migrainosus: Secondary | ICD-10-CM | POA: Diagnosis not present

## 2024-10-16 DIAGNOSIS — Z9889 Other specified postprocedural states: Secondary | ICD-10-CM

## 2024-10-16 DIAGNOSIS — F339 Major depressive disorder, recurrent, unspecified: Secondary | ICD-10-CM

## 2024-10-16 MED ORDER — LISDEXAMFETAMINE DIMESYLATE 20 MG PO CAPS
20.0000 mg | ORAL_CAPSULE | Freq: Every day | ORAL | 0 refills | Status: DC
  Filled 2024-10-16: qty 30, 30d supply, fill #0

## 2024-10-16 NOTE — Progress Notes (Signed)
 Name: Amanda Fields   MRN: [id number]    DOB: Nov 09, 1976   Date:10/16/2024       Progress Note  Subjective  Chief Complaint  Chief Complaint  Patient presents with   Medical Management of Chronic Issues    I connected with  Amanda Fields  on 10/16/24 at 11:00 AM EDT by a video enabled telemedicine application and verified that I am speaking with the correct person using two identifiers.  I discussed the limitations of evaluation and management by telemedicine and the availability of in person appointments. The patient expressed understanding and agreed to proceed with the virtual visit  Staff also discussed with the patient that there may be a patient responsible charge related to this service. Patient Location: at work  Provider Location: [facility] Additional Individuals present: alone   Discussed the use of AI scribe software for clinical note transcription with the patient, who gave verbal consent to proceed.  History of Present Illness Amanda Fields is a 48 year old female who presents for medication refills.  She was recently treated for sinus bronchitis at an urgent care facility, where her blood pressure was noted to be elevated. However, she has since monitored it at home, with a reading of 128/88 mmHg this morning. She has  hypertension and is currently taking olmesartan  HCTZ 20/12.5 mg and metoprolol  50 mg daily. Metoprolol  helps control her palpitations, and she has not experienced any side effects from her medications. She reports that metoprolol  controls her palpitations.  She underwent right carpal tunnel release surgery on July 25, 2024, and had stitches removed on August 06, 2024. She has bilateral carpal tunnel syndrome, but only the right side has been operated on so far.  She has major depression recurrent , currently mild  with a PHQ-9 score improving from 18 in July to 5 currently. She attributes some of her depressive symptoms to stressors, including her mother's  recent hospitalization and her uncle's cirrhosis diagnosis. She is currently taking venlafaxine  150 mg and Seroquel  for sleep, which she feels are helping her manage her symptoms. She feels tired and slightly down but attributes this to normal stressors and life events.  She has ADHD, predominantly inattentive type, and is taking Vyvanse  20 mg daily. She reports no significant side effects and feels it helps with her focus.  She has a history of migraines and is taking Topamax  nightly for prevention and Nurtec as needed for acute attacks. Nurtec effectively alleviates her migraines without causing rebound headaches.  She has been making dietary changes to manage her prediabetes, focusing on fruits and vegetables and reducing intake of potatoes and rice. She has lost weight, now weighing 186 lbs, down from 190 lbs in July. She works at [employer] and is very active, walking 25,000 to 35,000 steps daily as part of her job. She packs her own lunch, primarily consisting of fruits and vegetables.  No recent swelling and she takes Lasix  as needed. She also uses Ventolin  for bronchitis-related symptoms but has not needed it recently.    Patient Active Problem List   Diagnosis Date Noted   Vasomotor symptoms due to menopause 03/05/2023   Decreased GFR 03/05/2023   LGSIL on Pap smear of cervix with positive HRHPV on 11/23/2022 and 11/26/2023 01/09/2023   Perennial allergic rhinitis with seasonal variation 09/04/2022   Dyslipidemia 09/04/2022   Vitamin D  deficiency 09/04/2022   Migraine without aura and with status migrainosus, not intractable 09/04/2022   Insomnia, persistent 09/04/2022  Major depression, recurrent, chronic 09/04/2022   Skin tags, multiple acquired 07/12/2022   ASCUS with positive high risk HPV cervical on 11/22/2021 11/28/2021   Status post laparoscopic supracervical hysterectomy for endometriosis 11/22/2021   Acne vulgaris 06/13/2021   Other hypertrophic disorders of the skin 06/13/2021    Dysplasia of cervix, low grade (CIN 1) 12/15/2020   Chronic rhinitis 08/05/2020   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/06/2020   Cervical dysplasia 11/24/2019   Hypertension due to endocrine disorder 12/10/2018   Pre-diabetes 09/27/2018   Prolapsed internal hemorrhoids, grade 2 08/28/2018   History of endometriosis 04/10/2018   GAD (generalized anxiety disorder) 04/10/2018   Mild recurrent major depression 04/10/2018   Obesity (BMI 30.0-34.9) 04/10/2018   ADHD (attention deficit hyperactivity disorder), 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   Palpitation 07/12/2017   Family history of malignant neoplasm of gastrointestinal tract    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   Allergic rhinitis 02/04/2016   Bilateral leg edema 04/09/2012   HTN (hypertension), benign     Past Surgical History:  Procedure Laterality Date   COLONOSCOPY WITH PROPOFOL  N/A 04/09/2017   Procedure: COLONOSCOPY WITH PROPOFOL ;  Surgeon: Rogelia Copping, MD;  Location: [facility];  Service: Endoscopy;  Laterality: N/A;  LATEX sensitivity   IR GENERIC HISTORICAL  07/07/2016   IR VENOGRAM RENAL UNI LEFT 07/07/2016 Marcey Moan, MD [facility]   IR GENERIC HISTORICAL  07/07/2016   IR VENOUS SAMPLING 07/07/2016 Marcey Moan, MD [facility]   IR GENERIC HISTORICAL  07/07/2016   IR VENOGRAM ADRENAL BI 07/07/2016 Marcey Moan, MD [facility]   IR GENERIC HISTORICAL  07/07/2016   IR US  GUIDE VASC ACCESS RIGHT 07/07/2016 Marcey Moan, MD [facility]   IR GENERIC HISTORICAL  07/07/2016   IR ANGIOGRAM SELECTIVE EACH ADDITIONAL VESSEL 07/07/2016 Marcey Moan, MD [facility]   IR GENERIC HISTORICAL  07/07/2016   IR VENOUS SAMPLING 07/07/2016 Marcey Moan, MD [facility]   IR GENERIC HISTORICAL  07/07/2016   IR US  GUIDE VASC ACCESS RIGHT 07/07/2016 Marcey Moan, MD [facility]   LAPAROSCOPIC SUPRACERVICAL HYSTERECTOMY  09/27/2010   For endometriosis. Ovaries and tubes are in place.   LEEP  03/07/2022   NASAL SEPTUM SURGERY      ROBOTIC ADRENALECTOMY Left 10/27/2016   Procedure: XI ROBOTIC LEFT ADRENALECTOMY;  Surgeon: Elspeth Schultze, MD;  Location: [facility];  Service: General;  Laterality: Left;   TONSILLECTOMY     TUBAL LIGATION  2006   WISDOM TOOTH EXTRACTION      Family History  Problem Relation Age of Onset   Hypertension Mother    Heart disease Mother    Diabetes Mother    COPD Mother    Hypotension Mother    Colon cancer Father    Emphysema Father    Cancer Father        colon   Hyperlipidemia Sister    Ovarian cancer Maternal Grandmother    Breast cancer Maternal Aunt        x2 times ? age of dx   Heart failure Paternal Uncle    Heart attack Cousin 32   Heart disease Cousin        heart attack   Adrenal disorder Neg Hx     Social History   Socioeconomic History   Marital status: Single    Spouse name: Not on file   Number of children: 2   Years of education: Not on file   Highest education level: Bachelor's degree (e.g., BA, AB, BS)  Occupational History  Occupation: microbiology     Employer: [employer]    Comment: [employer]    Occupation: aplication review    Employer: [employer]    Comment: graduate school   Tobacco Use   Smoking status: Never    Passive exposure: Never   Smokeless tobacco: Never  Vaping Use   Vaping status: Never Used  Substance and Sexual Activity   Alcohol use: Yes    Comment: social (1 drink/mo)   Drug use: No   Sexual activity: Yes    Partners: Male    Birth control/protection: Surgical, Condom  Other Topics Concern   Not on file  Social History Narrative   Her son is a Holiday Representative and daughter is 29 yo and just moved back home 2023 with her new born son.      Social Drivers of Corporate Investment Banker Strain: Low Risk  (08/06/2024)   Received from [facility]   Overall Financial Resource Strain (CARDIA)    Difficulty of Paying Living Expenses: Not hard at all  Food Insecurity: No Food Insecurity (08/06/2024)   Received from [facility]   Hunger Vital Sign    Within the past 12 months, you worried that your food would run out before you got the money to buy more.: Never true    Within the past 12 months, the food you bought just didn't last and you didn't have money to get more.: Never true  Transportation Needs: No Transportation Needs (08/06/2024)   [facility]    In the past 12 months, has lack of transportation kept you from medical appointments or from getting medications?: No    Lack of Transportation (Non-Medical): No  Physical Activity: Insufficiently Active (10/02/2023)   Exercise Vital Sign    Days of Exercise per Week: 2 days    Minutes of Exercise per Session: 40 min  Stress: Stress Concern Present (10/02/2023)   [employer] of Occupational Health - Occupational Stress Questionnaire    Feeling of Stress : To some extent  Social Connections: Socially Integrated (10/02/2023)   Social Connection and Isolation Panel    Frequency of Communication with Friends and Family: More than three times a week    Frequency of Social Gatherings with Friends and Family: Once a week    Attends Religious Services: More than 4 times per year    Active Member of [employer] or Organizations: Yes    Attends Banker Meetings: More than 4 times per year    Marital Status: Living with partner  Intimate Partner Violence: Not At Risk (08/28/2018)   Humiliation, Afraid, Rape, and Kick questionnaire    Fear of Current or Ex-Partner: No    Emotionally Abused: No    Physically Abused: No    Sexually Abused: No     Current Outpatient Medications:    albuterol  (VENTOLIN  HFA) 108 (90 Base) MCG/ACT inhaler, Inhale 2 puffs into the lungs every 4 (four) hours as needed., Disp: 6.7 g, Rfl: 0   azithromycin  (ZITHROMAX  Z-PAK) 250 MG tablet, Take 2 tablets on day 1 then 1 tablet daily, Disp: 6 tablet, Rfl: 0   Boric Acid CRYS, Place 600 mg vaginally at  bedtime. Use vaginally every night for two weeks then twice a week for six months, Disp: 500 g, Rfl: 5   estradiol  (CLIMARA ) 0.05 mg/24hr patch, Place 1 patch (0.05 mg total) onto the skin once a week., Disp: 4 patch,  Rfl: 12   furosemide  (LASIX ) 20 MG tablet, Take 1 tablet (20 mg total) by mouth daily as needed., Disp: 30 tablet, Rfl: 0   lisdexamfetamine (VYVANSE ) 20 MG capsule, Take 1 capsule (20 mg total) by mouth daily., Disp: 30 capsule, Rfl: 0   lisdexamfetamine (VYVANSE ) 20 MG capsule, Take 1 capsule (20 mg total) by mouth daily., Disp: 30 capsule, Rfl: 0   lisdexamfetamine (VYVANSE ) 20 MG capsule, Take 1 capsule (20 mg total) by mouth daily., Disp: 30 capsule, Rfl: 0   metoprolol  succinate (TOPROL -XL) 50 MG 24 hr tablet, Take with or immediately following a meal., Disp: 90 tablet, Rfl: 1   olmesartan -hydrochlorothiazide  (BENICAR  HCT) 20-12.5 MG tablet, Take 1 tablet by mouth daily., Disp: 90 tablet, Rfl: 1   potassium chloride  SA (KLOR-CON  M) 20 MEQ tablet, Take 1 tablet (20 mEq total) by mouth daily. When you take furosemide , Disp: 30 tablet, Rfl: 0   QUEtiapine  (SEROQUEL ) 25 MG tablet, Take 1 tablet (25 mg total) by mouth at bedtime., Disp: 30 tablet, Rfl: 0   Rimegepant Sulfate (NURTEC) 75 MG TBDP, Take 1 tablet (75 mg total) by mouth daily as needed., Disp: 16 tablet, Rfl: 2   topiramate  (TOPAMAX ) 100 MG tablet, Take 1 tablet (100 mg total) by mouth every evening., Disp: 90 tablet, Rfl: 1   venlafaxine  XR (EFFEXOR  XR) 150 MG 24 hr capsule, Take 1 capsule (150 mg total) by mouth daily with breakfast., Disp: 90 capsule, Rfl: 1  Allergies  Allergen Reactions   Ace Inhibitors Cough   Latex Itching    Gloves, condoms   Pollen Extract     I personally reviewed active problem list, medication list, allergies with the patient/caregiver today.   ROS  Ten systems reviewed and is negative except as mentioned in HPI    Objective  Virtual encounter, vitals not obtained.  There is no  height or weight on file to calculate BMI.  Physical Exam  Awake, alert and oriented   PHQ2/9:    10/16/2024   10:50 AM 07/15/2024   10:01 AM 03/20/2024    8:20 AM 01/08/2024    4:14 PM 11/26/2023    3:39 PM  Depression screen PHQ 2/9  Decreased Interest 1 3 3  0 2  Down, Depressed, Hopeless 1 3 3  0 2  PHQ - 2 Score 2 6 6  0 4  Altered sleeping 1 3 3  0 3  Tired, decreased energy 1 3 3 2 2   Change in appetite 0 3 0 0 1  Feeling bad or failure about yourself  1 3 0 0 1  Trouble concentrating 0 0 3 1 1   Moving slowly or fidgety/restless 0 0 0 0 2  Suicidal thoughts 0 0 0 0 0  PHQ-9 Score 5 18 15 3 14   Difficult doing work/chores   Somewhat difficult  Not difficult at all   PHQ-2/9 Result is positive.    Fall Risk:    01/08/2024    4:14 PM 10/08/2023    1:54 PM 07/06/2023    1:29 PM 05/07/2023    1:28 PM 04/05/2023   11:22 AM  Fall Risk   Falls in the past year? 0 0 0 0 0  Number falls in past yr: 0 0 0  0  Injury with Fall? 0 0 0  0  Risk for fall due to : No Fall Risks No Fall Risks No Fall Risks No Fall Risks No Fall Risks  Follow up Falls prevention discussed Falls prevention discussed Falls  prevention discussed Falls prevention discussed Falls prevention discussed      Assessment & Plan Major depressive disorder Improved with current therapy. PHQ-9 score decreased from 18 to 5. Venlafaxine  and Seroquel  reported as helpful. - Continue venlafaxine  150 mg daily. - Continue Seroquel  for sleep.  Migraine without aura Well-controlled with Topamax  and Nurtec. Nurtec effectively aborts migraines without rebound headaches. - Continue Topamax  nightly for migraine prevention. - Continue Nurtec as needed for acute migraine attacks.  Attention-deficit hyperactivity disorder, predominantly inattentive type Stable on Vyvanse  20 mg daily. No significant side effects reported. - Prescribe Vyvanse  20 mg daily with a 90-day supply.  Essential hypertension Controlled with olmesartan   HCTZ and metoprolol . Recent BP 128/88 mmHg. - Continue olmesartan  HCTZ 20/12.5 mg daily. - Continue metoprolol  50 mg daily.  Prediabetes Managed with lifestyle modifications. Reports significant weight loss and dietary changes. - Continue lifestyle modifications focusing on diet and physical activity.  Bilateral carpal tunnel syndrome, status post right carpal tunnel release Left wrist pending surgery after right wrist heals, approximately six months post-surgery. - Plan for left carpal tunnel release after six months post right wrist surgery.      I discussed the assessment and treatment plan with the patient. The patient was provided an opportunity to ask questions and all were answered. The patient agreed with the plan and demonstrated an understanding of the instructions.  The patient was advised to call back or seek an in-person evaluation if the symptoms worsen or if the condition fails to improve as anticipated.  I provided 25 minutes of non-face-to-face time during this encounter.

## 2024-11-07 ENCOUNTER — Other Ambulatory Visit: Payer: Self-pay | Admitting: Obstetrics & Gynecology

## 2024-11-07 DIAGNOSIS — Z1231 Encounter for screening mammogram for malignant neoplasm of breast: Secondary | ICD-10-CM

## 2024-11-20 ENCOUNTER — Other Ambulatory Visit: Payer: Self-pay | Admitting: Family Medicine

## 2024-11-20 ENCOUNTER — Other Ambulatory Visit: Payer: Self-pay

## 2024-11-20 DIAGNOSIS — F330 Major depressive disorder, recurrent, mild: Secondary | ICD-10-CM

## 2024-11-20 DIAGNOSIS — F900 Attention-deficit hyperactivity disorder, predominantly inattentive type: Secondary | ICD-10-CM

## 2024-11-21 ENCOUNTER — Other Ambulatory Visit: Payer: Self-pay

## 2024-11-21 ENCOUNTER — Other Ambulatory Visit: Payer: Self-pay | Admitting: Family Medicine

## 2024-11-21 DIAGNOSIS — F900 Attention-deficit hyperactivity disorder, predominantly inattentive type: Secondary | ICD-10-CM

## 2024-11-21 DIAGNOSIS — F330 Major depressive disorder, recurrent, mild: Secondary | ICD-10-CM

## 2024-11-24 ENCOUNTER — Other Ambulatory Visit: Payer: Self-pay

## 2024-11-24 NOTE — Telephone Encounter (Signed)
 Requested medication (s) are due for refill today - no  Requested medication (s) are on the active medication list -yes  Future visit scheduled -no  Last refill: 10/16/24 #90  Notes to clinic: non delegated Rx  Requested Prescriptions  Pending Prescriptions Disp Refills   lisdexamfetamine (VYVANSE ) 20 MG capsule 90 capsule 0    Sig: Take 1 capsule (20 mg total) by mouth daily.     Not Delegated - Psychiatry:  Stimulants/ADHD Failed - 11/24/2024  3:49 PM      Failed - This refill cannot be delegated      Failed - Urine Drug Screen completed in last 360 days      Failed - Last BP in normal range    BP Readings from Last 1 Encounters:  09/05/24 (!) 150/106         Passed - Last Heart Rate in normal range    Pulse Readings from Last 1 Encounters:  09/05/24 78         Passed - Valid encounter within last 6 months    Recent Outpatient Visits           1 month ago ADHD (attention deficit hyperactivity disorder), inattentive type   [facility] Glenard Mire, MD   4 months ago Carpal tunnel syndrome, unspecified laterality   [facility] Glenard Mire, MD   8 months ago Polyarthralgia   [facility] Glenard Mire, MD                 Requested Prescriptions  Pending Prescriptions Disp Refills   lisdexamfetamine (VYVANSE ) 20 MG capsule 90 capsule 0    Sig: Take 1 capsule (20 mg total) by mouth daily.     Not Delegated - Psychiatry:  Stimulants/ADHD Failed - 11/24/2024  3:49 PM      Failed - This refill cannot be delegated      Failed - Urine Drug Screen completed in last 360 days      Failed - Last BP in normal range    BP Readings from Last 1 Encounters:  09/05/24 (!) 150/106         Passed - Last Heart Rate in normal range    Pulse Readings from Last 1 Encounters:  09/05/24 78         Passed - Valid encounter within last 6 months    Recent Outpatient Visits           1  month ago ADHD (attention deficit hyperactivity disorder), inattentive type   [facility] Glenard Mire, MD   4 months ago Carpal tunnel syndrome, unspecified laterality   [facility] Glenard Mire, MD   8 months ago Polyarthralgia   [facility] Glenard Mire, MD

## 2024-11-24 NOTE — Telephone Encounter (Signed)
 Unable to refuse

## 2024-11-25 ENCOUNTER — Other Ambulatory Visit: Payer: Self-pay

## 2024-11-25 MED FILL — Lisdexamfetamine Dimesylate Cap 20 MG: ORAL | 30 days supply | Qty: 30 | Fill #0 | Status: CN

## 2024-11-26 ENCOUNTER — Other Ambulatory Visit: Payer: Self-pay

## 2024-12-04 ENCOUNTER — Inpatient Hospital Stay: Admission: RE | Admit: 2024-12-04 | Discharge: 2024-12-04

## 2024-12-04 DIAGNOSIS — Z1231 Encounter for screening mammogram for malignant neoplasm of breast: Secondary | ICD-10-CM

## 2024-12-05 ENCOUNTER — Other Ambulatory Visit: Payer: Self-pay

## 2024-12-12 ENCOUNTER — Other Ambulatory Visit: Payer: Self-pay

## 2024-12-12 MED FILL — Lisdexamfetamine Dimesylate Cap 20 MG: ORAL | 30 days supply | Qty: 30 | Fill #0 | Status: AC

## 2024-12-15 ENCOUNTER — Encounter: Payer: Self-pay | Admitting: Family Medicine

## 2024-12-15 ENCOUNTER — Other Ambulatory Visit (HOSPITAL_COMMUNITY)
Admission: RE | Admit: 2024-12-15 | Discharge: 2024-12-15 | Disposition: A | Discharge status: Home / Self Care | Source: Ambulatory Visit | Attending: Family Medicine | Admitting: Family Medicine

## 2024-12-15 ENCOUNTER — Other Ambulatory Visit: Payer: Self-pay

## 2024-12-15 ENCOUNTER — Ambulatory Visit: Admitting: Family Medicine

## 2024-12-15 VITALS — BP 130/86 | HR 72 | Resp 16 | Ht 66.0 in | Wt 189.1 lb

## 2024-12-15 DIAGNOSIS — E559 Vitamin D deficiency, unspecified: Secondary | ICD-10-CM | POA: Diagnosis not present

## 2024-12-15 DIAGNOSIS — Z113 Encounter for screening for infections with a predominantly sexual mode of transmission: Secondary | ICD-10-CM

## 2024-12-15 DIAGNOSIS — N951 Menopausal and female climacteric states: Secondary | ICD-10-CM | POA: Diagnosis not present

## 2024-12-15 DIAGNOSIS — E785 Hyperlipidemia, unspecified: Secondary | ICD-10-CM

## 2024-12-15 DIAGNOSIS — F900 Attention-deficit hyperactivity disorder, predominantly inattentive type: Secondary | ICD-10-CM | POA: Diagnosis not present

## 2024-12-15 DIAGNOSIS — R7303 Prediabetes: Secondary | ICD-10-CM

## 2024-12-15 DIAGNOSIS — G43009 Migraine without aura, not intractable, without status migrainosus: Secondary | ICD-10-CM | POA: Diagnosis not present

## 2024-12-15 DIAGNOSIS — F330 Major depressive disorder, recurrent, mild: Secondary | ICD-10-CM | POA: Diagnosis not present

## 2024-12-15 DIAGNOSIS — I10 Essential (primary) hypertension: Secondary | ICD-10-CM | POA: Diagnosis not present

## 2024-12-15 DIAGNOSIS — E538 Deficiency of other specified B group vitamins: Secondary | ICD-10-CM | POA: Diagnosis not present

## 2024-12-15 DIAGNOSIS — R7982 Elevated C-reactive protein (CRP): Secondary | ICD-10-CM

## 2024-12-15 DIAGNOSIS — R002 Palpitations: Secondary | ICD-10-CM | POA: Diagnosis not present

## 2024-12-15 MED ORDER — OLMESARTAN MEDOXOMIL-HCTZ 20-12.5 MG PO TABS: 1.0000 | ORAL_TABLET | Freq: Every day | ORAL | 1 refills | Status: AC

## 2024-12-15 MED ORDER — NURTEC 75 MG PO TBDP: 1.0000 | ORAL_TABLET | Freq: Every day | ORAL | 2 refills | Status: AC | PRN

## 2024-12-15 MED ORDER — LISDEXAMFETAMINE DIMESYLATE 20 MG PO CAPS
20.0000 mg | ORAL_CAPSULE | Freq: Every day | ORAL | 0 refills | Status: AC
  Filled 2024-12-15: qty 90, 90d supply, fill #0

## 2024-12-15 MED ORDER — TOPIRAMATE 100 MG PO TABS: 100.0000 mg | ORAL_TABLET | Freq: Every evening | ORAL | 1 refills | Status: AC

## 2024-12-15 MED ORDER — METOPROLOL SUCCINATE ER 50 MG PO TB24: ORAL_TABLET | ORAL | 1 refills | Status: AC

## 2024-12-15 MED ORDER — VENLAFAXINE HCL ER 150 MG PO CP24: 150.0000 mg | ORAL_CAPSULE | Freq: Every day | ORAL | 1 refills | Status: AC

## 2024-12-15 MED ORDER — QUETIAPINE FUMARATE 25 MG PO TABS: 25.0000 mg | ORAL_TABLET | Freq: Every day | ORAL | 0 refills | Status: AC

## 2024-12-15 MED ORDER — LEVOCETIRIZINE DIHYDROCHLORIDE 5 MG PO TABS
5.0000 mg | ORAL_TABLET | Freq: Every evening | ORAL | 1 refills | Status: AC
  Filled 2024-12-15: qty 30, 30d supply, fill #0

## 2024-12-15 NOTE — Progress Notes (Signed)
 Name: JESSALYN HINOJOSA   MRN: [id number]    DOB: 03/20/1976   Date:12/15/2024       Progress Note  Subjective  Chief Complaint  Chief Complaint  Patient presents with   Medical Management of Chronic Issues   Discussed the use of AI scribe software for clinical note transcription with the patient, who gave verbal consent to proceed.  History of Present Illness Amanda Fields is a 48 year old female who presents for a follow-up visit to manage multiple chronic conditions.  She is postmenopausal following a partial hysterectomy in 2011 due to endometriosis. She is concerned about her bone health following menopause. She takes a daily vitamin D  supplement of 2000 IU and maintains a high-calcium  diet to support bone health. Her most recent blood work in March showed a vitamin D  level of 38.2 ng/mL.  She has a history of ADHD, inattentive type, and takes Vyvanse  20 mg daily, which helps her focus and stay on task, particularly at work. 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  She has mild recurrent major depression and takes Effexor  150 mg, which also helps with hot flashes. For sleep, she takes quetiapine  25 mg at night but has difficulty staying asleep, often waking after three hours. She has tried trazodone  in the past without success.  She experiences palpitations and is on metoprolol  50 mg plus Benicar  hydrochlorothiazide  20/12.5 mg for hypertension. She denies chest pain or SOB  She reports allergy symptoms, including itching in her nose and ears, and takes Xyzal , an over-the-counter antihistamine.  She has a history of carpal tunnel syndrome, having undergone surgery on her right hand. She now experiences symptoms in her left hand. She also reports joint pain, particularly in her knee, which she attributes to a past track injury.  She has a history of migraines and takes Topamax  100 mg nightly for prevention. She uses Nurtec as needed for acute migraine  relief and supplements with magnesium based on her own research. Episodes are sporadic now less than twice a month on average   She reports a recent episode of vaginal itching, which she associates with using a new bubble bath product.    Patient Active Problem List   Diagnosis Date Noted   Vasomotor symptoms due to menopause 03/05/2023   Decreased GFR 03/05/2023   LGSIL on Pap smear of cervix with positive HRHPV on 11/23/2022 and 11/26/2023 01/09/2023   Perennial allergic rhinitis with seasonal variation 09/04/2022   Dyslipidemia 09/04/2022   Vitamin D  deficiency 09/04/2022   Migraine without aura and without status migrainosus, not intractable 09/04/2022   Insomnia, persistent 09/04/2022   Major depression, recurrent, chronic 09/04/2022   Skin tags, multiple acquired 07/12/2022   ASCUS with positive high risk HPV cervical on 11/22/2021 11/28/2021   Status post laparoscopic supracervical hysterectomy for endometriosis 11/22/2021   Acne vulgaris 06/13/2021   Other hypertrophic disorders of the skin 06/13/2021   Dysplasia of cervix, low grade (CIN 1) 12/15/2020   Chronic rhinitis 08/05/2020   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/06/2020   Cervical dysplasia 11/24/2019   Hypertension due to endocrine disorder 12/10/2018   Pre-diabetes 09/27/2018   Prolapsed internal hemorrhoids, grade 2 08/28/2018   History of endometriosis 04/10/2018   GAD (generalized anxiety disorder) 04/10/2018   Mild recurrent major depression 04/10/2018   Obesity (BMI 30.0-34.9) 04/10/2018   ADHD (attention deficit hyperactivity disorder), 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   Palpitation 07/12/2017   Family history of malignant neoplasm of gastrointestinal  tract    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 Kathlynn   COLONOSCOPY WITH PROPOFOL  N/A  04/09/2017   Procedure: COLONOSCOPY WITH PROPOFOL ;  Surgeon: Rogelia Copping, MD;  Location: [facility];  Service: Endoscopy;  Laterality: N/A;  LATEX sensitivity   IR GENERIC HISTORICAL  07/07/2016   IR VENOGRAM RENAL UNI LEFT 07/07/2016 Marcey Moan, MD [facility]   IR GENERIC HISTORICAL  07/07/2016   IR VENOUS SAMPLING 07/07/2016 Marcey Moan, MD [facility]   IR GENERIC HISTORICAL  07/07/2016   IR VENOGRAM ADRENAL BI 07/07/2016 Marcey Moan, MD [facility]   IR GENERIC HISTORICAL  07/07/2016   IR US  GUIDE VASC ACCESS RIGHT 07/07/2016 Marcey Moan, MD [facility]   IR GENERIC HISTORICAL  07/07/2016   IR ANGIOGRAM SELECTIVE EACH ADDITIONAL VESSEL 07/07/2016 Marcey Moan, MD [facility]   IR GENERIC HISTORICAL  07/07/2016   IR VENOUS SAMPLING 07/07/2016 Marcey Moan, MD [facility]   IR GENERIC HISTORICAL  07/07/2016   IR US  GUIDE VASC ACCESS RIGHT 07/07/2016 Marcey Moan, MD [facility]   LAPAROSCOPIC SUPRACERVICAL HYSTERECTOMY  09/27/2010   For endometriosis. Ovaries and tubes are in place.   LEEP  03/07/2022   NASAL SEPTUM SURGERY     ROBOTIC ADRENALECTOMY Left 10/27/2016   Procedure: XI ROBOTIC LEFT ADRENALECTOMY;  Surgeon: Elspeth Schultze, MD;  Location: [facility];  Service: General;  Laterality: Left;   TONSILLECTOMY     TUBAL LIGATION  2006   WISDOM TOOTH EXTRACTION      Family History  Problem Relation Age of Onset   Hypertension Mother    Heart disease Mother    Diabetes Mother    COPD Mother    Hypotension Mother    Colon cancer Father    Emphysema Father    Cancer Father        colon   Hyperlipidemia Sister    Ovarian cancer Maternal Grandmother    Breast cancer Maternal Aunt        x2 times ? age of dx   Heart failure Paternal Uncle    Heart attack Cousin 32   Heart disease Cousin        heart attack   Adrenal disorder Neg Hx     Social History   Tobacco Use   Smoking status: Never    Passive exposure: Never   Smokeless tobacco:  Never  Substance Use Topics   Alcohol use: Yes    Comment: social (1 drink/mo)    Current Medications[1]  Allergies[2]  I personally reviewed active problem list, medication list, allergies, family history with the patient/caregiver today.   ROS  Ten systems reviewed and is negative except as mentioned in HPI    Objective Physical Exam VITALS: BP- 130/86 CONSTITUTIONAL: Patient appears well-developed and well-nourished. No distress. HEENT: Head atraumatic, normocephalic, neck supple. Oral cavity normal. CARDIOVASCULAR: Normal rate, regular rhythm and normal heart sounds. No murmur heard. No BLE edema. PULMONARY: Effort normal and breath sounds normal. No respiratory distress. ABDOMINAL: There is no tenderness or distention. MUSCULOSKELETAL: Normal gait. Without gross motor or sensory deficit. PSYCHIATRIC: Patient has a normal mood and affect. Behavior is normal. Judgment and thought content normal.  Vitals:   12/15/24 0840  BP: 130/86  Pulse: 72  Resp: 16  SpO2: 97%  Weight: 189  lb 1.6 oz (85.8 kg)  Height: 5' 6 (1.676 m)    Body mass index is 30.52 kg/m.  PHQ2/9:    12/15/2024    8:40 AM 10/16/2024   10:50 AM 07/15/2024   10:01 AM 03/20/2024    8:20 AM 01/08/2024    4:14 PM  Depression screen PHQ 2/9  Decreased Interest 0 1 3 3  0  Down, Depressed, Hopeless 0 1 3 3  0  PHQ - 2 Score 0 2 6 6  0  Altered sleeping 0 1 3 3  0  Tired, decreased energy 0 1 3 3 2   Change in appetite 0 0 3 0 0  Feeling bad or failure about yourself  0 1 3 0 0  Trouble concentrating 0 0 0 3 1  Moving slowly or fidgety/restless 0 0 0 0 0  Suicidal thoughts 0 0 0 0 0  PHQ-9 Score 0 5  18  15  3    Difficult doing work/chores Not difficult at all   Somewhat difficult      Data saved with a previous flowsheet row definition    phq 9 is negative  Fall Risk:    12/15/2024    8:35 AM 01/08/2024    4:14 PM 10/08/2023    1:54 PM 07/06/2023    1:29 PM 05/07/2023    1:28 PM  Fall Risk    Falls in the past year? 1 0 0 0 0  Number falls in past yr: 0 0 0 0   Injury with Fall? 0 0  0  0    Risk for fall due to : Impaired balance/gait No Fall Risks No Fall Risks No Fall Risks No Fall Risks  Follow up Falls evaluation completed Falls prevention discussed Falls prevention discussed Falls prevention discussed Falls prevention discussed     Data saved with a previous flowsheet row definition     Assessment & Plan Attention-deficit hyperactivity disorder, predominantly inattentive type Vyvanse  20 mg daily improves focus and task completion. - Prescribed Vyvanse  20 mg daily, 90-day supply.  Major depressive disorder, recurrent, mild Effexor  150 mg daily effective for depression and vasomotor symptoms. - Continue Effexor  150 mg daily.  Essential hypertension Blood pressure 130/86 mmHg, elevated from previous readings. Palpitations controlled with metoprolol . - Continue metoprolol  50 mg daily. - Continue olmesartan  hctz  20/12.5  mg daily.  Migraine without aura Managed with Topamax  for prevention and Nurtec as needed. Magnesium supplements used for additional support. - Continue Topamax  100 mg daily. - Prescribed Nurtec as needed for acute migraine attacks.  Vitamin D  deficiency Vitamin D  levels improved with supplementation. Current level 38.2 ng/mL. - Continue vitamin D  2000 IU daily.  Deficiency of vitamin B12 Previous low levels. - Ordered blood test to check vitamin B12 levels.  Dyslipidemia - recheck lipid panel today - on life style modification only   Palpitations Controlled with metoprolol . - Continue metoprolol  50 mg daily.  Vasomotor symptoms due to menopause Effexor  effective for hot flashes. - Continue Effexor  150 mg daily.  Elevated C-reactive protein Previous elevated CRP levels. - Ordered blood test to check CRP levels.  General health maintenance Discussed osteoporosis prevention strategies, including physical activity and calcium   intake. - Encouraged weight-bearing exercises and high calcium  diet. - Continue vitamin D  2000 IU daily.        [1]  Current Outpatient Medications:    estradiol  (CLIMARA ) 0.05 mg/24hr patch, Place 1 patch (0.05 mg total) onto the skin once a week., Disp: 4 patch, Rfl: 12  lisdexamfetamine  (VYVANSE ) 20 MG capsule, Take 1 capsule (20 mg total) by mouth daily., Disp: 30 capsule, Rfl: 0   metoprolol  succinate (TOPROL -XL) 50 MG 24 hr tablet, Take with or immediately following a meal., Disp: 90 tablet, Rfl: 1   olmesartan -hydrochlorothiazide  (BENICAR  HCT) 20-12.5 MG tablet, Take 1 tablet by mouth daily., Disp: 90 tablet, Rfl: 1   potassium chloride  SA (KLOR-CON  M) 20 MEQ tablet, Take 1 tablet (20 mEq total) by mouth daily. When you take furosemide , Disp: 30 tablet, Rfl: 0   QUEtiapine  (SEROQUEL ) 25 MG tablet, Take 1 tablet (25 mg total) by mouth at bedtime., Disp: 30 tablet, Rfl: 0   Rimegepant Sulfate (NURTEC) 75 MG TBDP, Take 1 tablet (75 mg total) by mouth daily as needed., Disp: 16 tablet, Rfl: 2   topiramate  (TOPAMAX ) 100 MG tablet, Take 1 tablet (100 mg total) by mouth every evening., Disp: 90 tablet, Rfl: 1   venlafaxine  XR (EFFEXOR  XR) 150 MG 24 hr capsule, Take 1 capsule (150 mg total) by mouth daily with breakfast., Disp: 90 capsule, Rfl: 1   Boric Acid CRYS, Place 600 mg vaginally at bedtime. Use vaginally every night for two weeks then twice a week for six months (Patient not taking: Reported on 12/15/2024), Disp: 500 g, Rfl: 5   fluconazole  (DIFLUCAN ) 150 MG tablet, Take 150 mg by mouth once. (Patient not taking: Reported on 12/15/2024), Disp: , Rfl:    furosemide  (LASIX ) 20 MG tablet, Take 1 tablet (20 mg total) by mouth daily as needed. (Patient not taking: Reported on 12/15/2024), Disp: 30 tablet, Rfl: 0 [2]  Allergies Allergen Reactions   Ace Inhibitors Cough   Latex Itching    Gloves, condoms   Pollen Extract

## 2024-12-16 LAB — COMPREHENSIVE METABOLIC PANEL WITH GFR
AG Ratio: 1.4 (calc) (ref 1.0–2.5)
ALT: 21 U/L (ref 6–29)
AST: 25 U/L (ref 10–35)
Albumin: 4.2 g/dL (ref 3.6–5.1)
Alkaline phosphatase (APISO): 71 U/L (ref 37–153)
BUN/Creatinine Ratio: 14 (calc) (ref 6–22)
BUN: 15 mg/dL (ref 7–25)
CO2: 28 mmol/L (ref 20–32)
Calcium: 9.7 mg/dL (ref 8.6–10.4)
Chloride: 106 mmol/L (ref 98–110)
Creat: 1.06 mg/dL — ABNORMAL HIGH (ref 0.50–1.03)
Globulin: 2.9 g/dL (ref 1.9–3.7)
Glucose, Bld: 104 mg/dL — ABNORMAL HIGH (ref 65–99)
Potassium: 4.7 mmol/L (ref 3.5–5.3)
Sodium: 142 mmol/L (ref 135–146)
Total Bilirubin: 0.4 mg/dL (ref 0.2–1.2)
Total Protein: 7.1 g/dL (ref 6.1–8.1)
eGFR: 64 mL/min/1.73m2

## 2024-12-16 LAB — CBC WITH DIFFERENTIAL/PLATELET
Absolute Lymphocytes: 1792 {cells}/uL (ref 850–3900)
Absolute Monocytes: 330 {cells}/uL (ref 200–950)
Basophils Absolute: 90 {cells}/uL (ref 0–200)
Basophils Relative: 1.6 %
Eosinophils Absolute: 493 {cells}/uL (ref 15–500)
Eosinophils Relative: 8.8 %
HCT: 44.1 % (ref 35.9–46.0)
Hemoglobin: 14 g/dL (ref 11.7–15.5)
MCH: 26.5 pg — ABNORMAL LOW (ref 27.0–33.0)
MCHC: 31.7 g/dL (ref 31.6–35.4)
MCV: 83.4 fL (ref 81.4–101.7)
MPV: 11 fL (ref 7.5–12.5)
Monocytes Relative: 5.9 %
Neutro Abs: 2895 {cells}/uL (ref 1500–7800)
Neutrophils Relative %: 51.7 %
Platelets: 318 Thousand/uL (ref 140–400)
RBC: 5.29 Million/uL — ABNORMAL HIGH (ref 3.80–5.10)
RDW: 13.5 % (ref 11.0–15.0)
Total Lymphocyte: 32 %
WBC: 5.6 Thousand/uL (ref 3.8–10.8)

## 2024-12-16 LAB — LIPID PANEL
Cholesterol: 224 mg/dL — ABNORMAL HIGH
HDL: 62 mg/dL
LDL Cholesterol (Calc): 137 mg/dL — ABNORMAL HIGH
Non-HDL Cholesterol (Calc): 162 mg/dL — ABNORMAL HIGH
Total CHOL/HDL Ratio: 3.6 (calc)
Triglycerides: 124 mg/dL

## 2024-12-16 LAB — HEMOGLOBIN A1C
Hgb A1c MFr Bld: 6 % — ABNORMAL HIGH
Mean Plasma Glucose: 126 mg/dL
eAG (mmol/L): 7 mmol/L

## 2024-12-16 LAB — CERVICOVAGINAL ANCILLARY ONLY
Bacterial Vaginitis (gardnerella): NEGATIVE
Candida Glabrata: NEGATIVE
Candida Vaginitis: NEGATIVE
Chlamydia: NEGATIVE
Comment: NEGATIVE
Comment: NEGATIVE
Comment: NEGATIVE
Comment: NEGATIVE
Comment: NEGATIVE
Comment: NORMAL
Neisseria Gonorrhea: NEGATIVE
Trichomonas: NEGATIVE

## 2024-12-16 LAB — B12 AND FOLATE PANEL
Folate: 9.3 ng/mL
Vitamin B-12: 512 pg/mL (ref 200–1100)

## 2024-12-16 LAB — HIV ANTIBODY (ROUTINE TESTING W REFLEX)
HIV 1&2 Ab, 4th Generation: NONREACTIVE
HIV FINAL INTERPRETATION: NEGATIVE

## 2024-12-16 LAB — SYPHILIS: RPR W/REFLEX TO RPR TITER AND TREPONEMAL ANTIBODIES, TRADITIONAL SCREENING AND DIAGNOSIS ALGORITHM: RPR Ser Ql: NONREACTIVE

## 2024-12-16 LAB — VITAMIN D 25 HYDROXY (VIT D DEFICIENCY, FRACTURES): Vit D, 25-Hydroxy: 32 ng/mL (ref 30–100)

## 2024-12-19 ENCOUNTER — Ambulatory Visit: Payer: Self-pay | Admitting: Family Medicine

## 2024-12-23 ENCOUNTER — Ambulatory Visit: Admitting: Family Medicine

## 2024-12-27 ENCOUNTER — Other Ambulatory Visit: Payer: Self-pay | Admitting: Family Medicine

## 2024-12-27 DIAGNOSIS — R002 Palpitations: Secondary | ICD-10-CM

## 2024-12-27 DIAGNOSIS — I10 Essential (primary) hypertension: Secondary | ICD-10-CM

## 2024-12-29 NOTE — Telephone Encounter (Signed)
 Requested Prescriptions  Refused Prescriptions Disp Refills   metoprolol  succinate (TOPROL -XL) 50 MG 24 hr tablet [Pharmacy Med Name: METOPROLOL  ER SUCCINATE 50MG  TABS] 90 tablet 1    Sig: TAKE 1 TABLET BY MOUTH DAILY WITH OR IMMEDIATELY FOLLOWING A MEAL     Cardiovascular:  Beta Blockers Passed - 12/29/2024  1:46 PM      Passed - Last BP in normal range    BP Readings from Last 1 Encounters:  12/15/24 130/86         Passed - Last Heart Rate in normal range    Pulse Readings from Last 1 Encounters:  12/15/24 72         Passed - Valid encounter within last 6 months    Recent Outpatient Visits           2 weeks ago ADHD (attention deficit hyperactivity disorder), inattentive type   [facility] Glenard Mire, MD   2 months ago ADHD (attention deficit hyperactivity disorder), inattentive type   [facility] Glenard Mire, MD   5 months ago Carpal tunnel syndrome, unspecified laterality   [facility] Glenard Mire, MD   9 months ago Polyarthralgia   [facility] Sowles, Krichna, MD

## 2025-01-06 ENCOUNTER — Ambulatory Visit
Admission: EM | Admit: 2025-01-06 | Discharge: 2025-01-06 | Disposition: A | Discharge status: Home / Self Care | Attending: Emergency Medicine | Admitting: Emergency Medicine

## 2025-01-06 DIAGNOSIS — R0981 Nasal congestion: Secondary | ICD-10-CM | POA: Insufficient documentation

## 2025-01-06 DIAGNOSIS — J069 Acute upper respiratory infection, unspecified: Secondary | ICD-10-CM | POA: Diagnosis not present

## 2025-01-06 DIAGNOSIS — J029 Acute pharyngitis, unspecified: Secondary | ICD-10-CM | POA: Diagnosis not present

## 2025-01-06 LAB — POCT RAPID STREP A (OFFICE): Rapid Strep A Screen: NEGATIVE

## 2025-01-06 LAB — POC SOFIA SARS ANTIGEN FIA: SARS Coronavirus 2 Ag: NEGATIVE

## 2025-01-06 MED ORDER — PROMETHAZINE-DM 6.25-15 MG/5ML PO SYRP: 5.0000 mL | ORAL_SOLUTION | Freq: Four times a day (QID) | ORAL | 0 refills | Status: AC | PRN

## 2025-01-06 MED ORDER — BENZONATATE 100 MG PO CAPS: 200.0000 mg | ORAL_CAPSULE | Freq: Three times a day (TID) | ORAL | 0 refills | Status: AC

## 2025-01-06 MED ORDER — IPRATROPIUM BROMIDE 0.06 % NA SOLN: 2.0000 | Freq: Four times a day (QID) | NASAL | 12 refills | Status: AC

## 2025-01-06 NOTE — ED Provider Notes (Signed)
 " [facility]    CSN: [id number] Arrival date & time: 01/06/25  1612      History   Chief Complaint Chief Complaint  Patient presents with   Cough   Sore Throat   Otalgia   Sinus Problem    HPI Amanda Fields is a 49 y.o. female.   HPI  49 year old female with past medical history significant for hypertension, bilateral leg edema, allergic rhinitis, ADHD, GAD, mild recurrent MDD, and prediabetes presents for evaluation of respiratory symptoms began 4 days ago.  She reports that her boyfriend has similar symptoms.  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  She denies any fever but she does endorse nasal congestion and sinus pressure with clear nasal discharge, sore throat, right ear pain, productive cough yellow sputum, shortness breath and wheezing when ambulating.  Past Medical History:  Diagnosis Date   ADHD (attention deficit hyperactivity disorder), 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   Adrenal mass, left    Anemia    none since Hysterectomy done-related to heavy menses   Asthma    Related to allergieswheezes with pollen exposure no asthma attacks   Breast lipoma 09/06/2011   resolved- no surgery-tx. medically.   Cough due to ACE inhibitor    Dizziness    Edema leg    Elevated troponin I level 12/31/2015   Endometriosis    Hypertension    a. echo 03/2012 EF 60-65%, moderate LVH, nl LV diastolic fxn, nl PASP; b. 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 03/05/2023   Decreased GFR 03/05/2023   LGSIL on Pap smear of cervix with positive HRHPV on 11/23/2022 and 11/26/2023 01/09/2023   Perennial allergic rhinitis with seasonal variation 09/04/2022   Dyslipidemia 09/04/2022   Vitamin D  deficiency 09/04/2022   Migraine without aura and without status  migrainosus, not intractable 09/04/2022   Insomnia, persistent 09/04/2022   Major depression, recurrent, chronic 09/04/2022   Multiple skin tags 07/12/2022   ASCUS with positive high risk HPV cervical on 11/22/2021 11/28/2021   Status post laparoscopic supracervical hysterectomy for endometriosis 11/22/2021   Acne vulgaris 06/13/2021   Other hypertrophic disorders of the skin 06/13/2021   Dysplasia of cervix, low grade (CIN 1) 12/15/2020   Chronic rhinitis 08/05/2020   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/06/2020   Cervical dysplasia 11/24/2019   Hypertension due to endocrine disorder 12/10/2018   Pre-diabetes 09/27/2018   Prolapsed internal hemorrhoids, grade 2 08/28/2018   History of endometriosis 04/10/2018   GAD (generalized anxiety disorder) 04/10/2018   Mild recurrent major depression 04/10/2018   Obesity (BMI 30.0-34.9) 04/10/2018   ADHD (attention deficit hyperactivity disorder), 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   Palpitation 07/12/2017   Family history of malignant neoplasm of gastrointestinal tract    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 Kathlynn   COLONOSCOPY WITH PROPOFOL  N/A 04/09/2017   Procedure: COLONOSCOPY WITH PROPOFOL ;  Surgeon: Rogelia Copping, MD;  Location: [facility];  Service: Endoscopy;  Laterality: N/A;  LATEX sensitivity   IR GENERIC HISTORICAL  07/07/2016   IR VENOGRAM RENAL UNI LEFT  07/07/2016 Marcey Moan, MD [facility]   IR GENERIC HISTORICAL  07/07/2016   IR VENOUS SAMPLING 07/07/2016 Marcey Moan, MD [facility]   IR GENERIC HISTORICAL  07/07/2016   IR VENOGRAM ADRENAL BI 07/07/2016 Marcey Moan, MD [facility]   IR GENERIC HISTORICAL  07/07/2016   IR US  GUIDE VASC ACCESS RIGHT 07/07/2016 Marcey Moan, MD [facility]   IR GENERIC HISTORICAL  07/07/2016   IR  ANGIOGRAM SELECTIVE EACH ADDITIONAL VESSEL 07/07/2016 Marcey Moan, MD [facility]   IR GENERIC HISTORICAL  07/07/2016   IR VENOUS SAMPLING 07/07/2016 Marcey Moan, MD [facility]   IR GENERIC HISTORICAL  07/07/2016   IR US  GUIDE VASC ACCESS RIGHT 07/07/2016 Marcey Moan, MD [facility]   LAPAROSCOPIC SUPRACERVICAL HYSTERECTOMY  09/27/2010   For endometriosis. Ovaries and tubes are in place.   LEEP  03/07/2022   NASAL SEPTUM SURGERY     ROBOTIC ADRENALECTOMY Left 10/27/2016   Procedure: XI ROBOTIC LEFT ADRENALECTOMY;  Surgeon: Elspeth Schultze, MD;  Location: [facility];  Service: General;  Laterality: Left;   TONSILLECTOMY     TUBAL LIGATION  2006   WISDOM TOOTH EXTRACTION      OB History     Gravida  2   Para  2   Term  2   Preterm      AB      Living  2      SAB      IAB      Ectopic      Multiple      Live Births  2            Home Medications    Prior to Admission medications  Medication Sig Start Date End Date Taking? Authorizing Provider  benzonatate  (TESSALON ) 100 MG capsule Take 2 capsules (200 mg total) by mouth every 8 (eight) hours. 01/06/25  Yes Bernardino Ditch, NP  ipratropium (ATROVENT ) 0.06 % nasal spray Place 2 sprays into both nostrils 4 (four) times daily. 01/06/25  Yes Bernardino Ditch, NP  promethazine -dextromethorphan (PROMETHAZINE -DM) 6.25-15 MG/5ML syrup Take 5 mLs by mouth 4 (four) times daily as needed. 01/06/25  Yes Bernardino Ditch, NP  estradiol  (CLIMARA ) 0.05 mg/24hr patch Place 1 patch (0.05 mg total) onto the skin once a week. 09/11/23   Anyanwu, Ugonna A, MD  levocetirizine (XYZAL ) 5 MG tablet Take 1 tablet (5 mg total) by mouth every evening. 12/15/24   Sowles, Krichna, MD  lisdexamfetamine  (VYVANSE ) 20 MG capsule Take 1 capsule (20 mg total) by mouth daily. 12/15/24   Sowles, Krichna, MD  metoprolol  succinate (TOPROL -XL) 50 MG 24 hr tablet Take with or immediately following a meal. 12/15/24   Sowles, Krichna, MD   olmesartan -hydrochlorothiazide  (BENICAR  HCT) 20-12.5 MG tablet Take 1 tablet by mouth daily. 12/15/24   Sowles, Krichna, MD  QUEtiapine  (SEROQUEL ) 25 MG tablet Take 1 tablet (25 mg total) by mouth at bedtime. 12/15/24   Sowles, Krichna, MD  Rimegepant Sulfate (NURTEC) 75 MG TBDP Take 1 tablet (75 mg total) by mouth daily as needed. 12/15/24   Sowles, Krichna, MD  topiramate  (TOPAMAX ) 100 MG tablet Take 1 tablet (100 mg total) by mouth every evening. 12/15/24   Sowles, Krichna, MD  venlafaxine  XR (EFFEXOR  XR) 150 MG 24 hr capsule Take 1 capsule (150 mg total) by mouth daily with breakfast. 12/15/24   Sowles, Krichna, MD    Family History Family History  Problem Relation Age of Onset   Hypertension Mother    Heart disease Mother  Diabetes Mother    COPD Mother    Hypotension Mother    Colon cancer Father    Emphysema Father    Cancer Father        colon   Hyperlipidemia Sister    Ovarian cancer Maternal Grandmother    Breast cancer Maternal Aunt        x2 times ? age of dx   Heart failure Paternal Uncle    Heart attack Cousin 32   Heart disease Cousin        heart attack   Adrenal disorder Neg Hx     Social History Social History[1]   Allergies   Ace inhibitors, Latex, and Pollen extract   Review of Systems Review of Systems  Constitutional:  Negative for fever.  HENT:  Positive for congestion, ear pain, rhinorrhea, sinus pressure and sore throat.   Respiratory:  Positive for cough, shortness of breath and wheezing.      Physical Exam Triage Vital Signs ED Triage Vitals  Encounter Vitals Group     BP --      Girls Systolic BP Percentile --      Girls Diastolic BP Percentile --      Boys Systolic BP Percentile --      Boys Diastolic BP Percentile --      Pulse --      Resp 01/06/25 1625 16     Temp --      Temp Source 01/06/25 1625 Oral     SpO2 --      Weight 01/06/25 1624 197 lb (89.4 kg)     Height --      Head Circumference --      Peak Flow --       Pain Score 01/06/25 1625 7     Pain Loc --      Pain Education --      Exclude from Growth Chart --    No data found.  Updated Vital Signs BP (!) 154/104 (BP Location: Left Arm)   Pulse 73   Temp 98.6 F (37 C) (Oral)   Resp 16   Wt 197 lb (89.4 kg)   SpO2 95%   BMI 31.80 kg/m   Visual Acuity Right Eye Distance:   Left Eye Distance:   Bilateral Distance:    Right Eye Near:   Left Eye Near:    Bilateral Near:     Physical Exam Vitals and nursing note reviewed.  Constitutional:      Appearance: Normal appearance. She is ill-appearing.  HENT:     Head: Normocephalic and atraumatic.     Right Ear: Tympanic membrane, ear canal and external ear normal. There is no impacted cerumen.     Left Ear: Tympanic membrane, ear canal and external ear normal. There is no impacted cerumen.     Nose: Congestion and rhinorrhea present.     Comments: Nasal mucosa is edematous erythematous with clear discharge in both nares.    Mouth/Throat:     Mouth: Mucous membranes are moist.     Pharynx: Oropharynx is clear. Posterior oropharyngeal erythema present. No oropharyngeal exudate.     Comments: Tonsillar pillars are surgically absent.  Posterior oropharynx demonstrates erythema with clear postnasal drip. Cardiovascular:     Rate and Rhythm: Normal rate and regular rhythm.     Pulses: Normal pulses.     Heart sounds: Normal heart sounds. No murmur heard.    No friction rub. No gallop.  Pulmonary:  Effort: Pulmonary effort is normal.     Breath sounds: Normal breath sounds. No wheezing, rhonchi or rales.  Musculoskeletal:     Cervical back: Normal range of motion and neck supple. No tenderness.  Lymphadenopathy:     Cervical: No cervical adenopathy.  Skin:    General: Skin is warm and dry.     Capillary Refill: Capillary refill takes less than 2 seconds.     Findings: No erythema or rash.  Neurological:     General: No focal deficit present.     Mental Status: She is alert and  oriented to person, place, and time.      UC Treatments / Results  Labs (all labs ordered are listed, but only abnormal results are displayed) Labs Reviewed  POC SOFIA SARS ANTIGEN FIA - Normal  POCT RAPID STREP A (OFFICE) - Normal  CULTURE, GROUP A STREP [facility])    EKG   Radiology No results found.  Procedures Procedures (including critical care time)  Medications Ordered in UC Medications - No data to display  Initial Impression / Assessment and Plan / UC Course  I have reviewed the triage vital signs and the nursing notes.  Pertinent labs & imaging results that were available during my care of the patient were reviewed by me and considered in my medical decision making (see chart for details).   Patient is a nontoxic, though mildly ill-appearing 49 year old female presenting for evaluation of 4 days worth of respiratory symptoms as outlined in HPI above.  Her most significant complaint is pain in her right ear.  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  There is no evidence of otitis media on otoscopic exam of either ear.  She does have edematous and erythematous nasal mucosa with clear nasal discharge and clear postnasal drip.  Cardiopulmonary exam reveals clear lung sounds in all fields.  Differential diagnose include COVID, influenza, strep pharyngitis, viral respiratory illness.  Due to the fact that she is on day 4 of symptoms I will not test her for influenza at this time but I will order a COVID antigen test and a rapid strep.  Rapid strep is negative.  I will send swab for culture.  COVID antigen test is negative.  I will discharge patient on the diagnosis of viral URI with cough.  Prescribe Atrovent  nasal spray for nasal congestion and Tessalon  Perles and Promethazine  DM cough syrup for cough and congestion.  She can use over-the-counter Tylenol  and/or ibuprofen as needed  for fever or pain.  Return precautions reviewed.  Work note provided.   Final Clinical Impressions(s) / UC Diagnoses   Final diagnoses:  Acute pharyngitis, unspecified etiology  Nasal congestion  Viral URI with cough     Discharge Instructions      Your testing today was negative for COVID and strep.  We will send your strep swab for culture.  However, I suspect that you have a respiratory virus that is causing your symptoms.  Use over-the-counter Tylenol  and/or ibuprofen according to the package instructions as needed for any fever or pain.  Use the Atrovent  nasal spray, 2 squirts in each nostril every 6 hours, as needed for runny nose and postnasal drip.  Use the Tessalon  Perles every 8 hours during the day.  Take them with a small sip of water .  They may give you some numbness to the base  of your tongue or a metallic taste in your mouth, this is normal.  Use the Promethazine  DM cough syrup at bedtime for cough and congestion.  It will make you drowsy so do not take it during the day.  Return for reevaluation or see your primary care provider for any new or worsening symptoms.      ED Prescriptions     Medication Sig Dispense Auth. Provider   benzonatate  (TESSALON ) 100 MG capsule Take 2 capsules (200 mg total) by mouth every 8 (eight) hours. 21 capsule Bernardino Ditch, NP   ipratropium (ATROVENT ) 0.06 % nasal spray Place 2 sprays into both nostrils 4 (four) times daily. 15 mL Bernardino Ditch, NP   promethazine -dextromethorphan (PROMETHAZINE -DM) 6.25-15 MG/5ML syrup Take 5 mLs by mouth 4 (four) times daily as needed. 118 mL Bernardino Ditch, NP      PDMP not reviewed this encounter.     [1]  Social History Tobacco Use   Smoking status: Never    Passive exposure: Never   Smokeless tobacco: Never  Vaping Use   Vaping status: Never Used  Substance Use Topics   Alcohol use: Yes    Comment: social (1 drink/mo)   Drug use: No     Bernardino Ditch, NP 01/06/25 1657  "

## 2025-01-06 NOTE — Discharge Instructions (Signed)
 Your testing today was negative for COVID and strep.  We will send your strep swab for culture.  However, I suspect that you have a respiratory virus that is causing your symptoms.  Use over-the-counter Tylenol  and/or ibuprofen according to the package instructions as needed for any fever or pain.  Use the Atrovent  nasal spray, 2 squirts in each nostril every 6 hours, as needed for runny nose and postnasal drip.  Use the Tessalon  Perles every 8 hours during the day.  Take them with a small sip of water .  They may give you some numbness to the base of your tongue or a metallic taste in your mouth, this is normal.  Use the Promethazine  DM cough syrup at bedtime for cough and congestion.  It will make you drowsy so do not take it during the day.  Return for reevaluation or see your primary care provider for any new or worsening symptoms.

## 2025-01-06 NOTE — ED Triage Notes (Signed)
 Pt c/o sinus pressure,cough,sore throat & R ear pain x4 days. Has tried OTC meds w/o relief.

## 2025-01-09 ENCOUNTER — Ambulatory Visit (HOSPITAL_COMMUNITY): Payer: Self-pay

## 2025-01-09 LAB — CULTURE, GROUP A STREP (THRC)

## 2025-01-12 ENCOUNTER — Ambulatory Visit: Admitting: Obstetrics & Gynecology

## 2025-03-10 ENCOUNTER — Ambulatory Visit: Admitting: Obstetrics & Gynecology

## 2025-03-16 ENCOUNTER — Ambulatory Visit: Admitting: Family Medicine
